# Patient Record
Sex: Male | Born: 1955
Health system: Southern US, Community
[De-identification: ages and names within clinical notes are randomized; demographics above are authoritative.]

## PROBLEM LIST (undated history)

## (undated) DIAGNOSIS — Z0000 Encounter for general adult medical examination without abnormal findings: Secondary | ICD-10-CM

## (undated) DIAGNOSIS — E1140 Type 2 diabetes mellitus with diabetic neuropathy, unspecified: Secondary | ICD-10-CM

## (undated) DIAGNOSIS — M1990 Unspecified osteoarthritis, unspecified site: Secondary | ICD-10-CM

## (undated) DIAGNOSIS — K219 Gastro-esophageal reflux disease without esophagitis: Secondary | ICD-10-CM

## (undated) DIAGNOSIS — G4700 Insomnia, unspecified: Secondary | ICD-10-CM

## (undated) DIAGNOSIS — I10 Essential (primary) hypertension: Secondary | ICD-10-CM

## (undated) DIAGNOSIS — I219 Acute myocardial infarction, unspecified: Secondary | ICD-10-CM

## (undated) DIAGNOSIS — Z860100 Personal history of colon polyps, unspecified: Secondary | ICD-10-CM

## (undated) DIAGNOSIS — G8929 Other chronic pain: Secondary | ICD-10-CM

## (undated) DIAGNOSIS — M79671 Pain in right foot: Secondary | ICD-10-CM

## (undated) DIAGNOSIS — Z87891 Personal history of nicotine dependence: Secondary | ICD-10-CM

## (undated) DIAGNOSIS — E785 Hyperlipidemia, unspecified: Secondary | ICD-10-CM

## (undated) DIAGNOSIS — M722 Plantar fascial fibromatosis: Secondary | ICD-10-CM

## (undated) DIAGNOSIS — I209 Angina pectoris, unspecified: Secondary | ICD-10-CM

## (undated) DIAGNOSIS — E669 Obesity, unspecified: Secondary | ICD-10-CM

## (undated) DIAGNOSIS — G709 Myoneural disorder, unspecified: Secondary | ICD-10-CM

## (undated) DIAGNOSIS — I2510 Atherosclerotic heart disease of native coronary artery without angina pectoris: Secondary | ICD-10-CM

## (undated) DIAGNOSIS — F17200 Nicotine dependence, unspecified, uncomplicated: Secondary | ICD-10-CM

## (undated) DIAGNOSIS — E119 Type 2 diabetes mellitus without complications: Secondary | ICD-10-CM

## (undated) DIAGNOSIS — D649 Anemia, unspecified: Secondary | ICD-10-CM

## (undated) DIAGNOSIS — I739 Peripheral vascular disease, unspecified: Secondary | ICD-10-CM

## (undated) HISTORY — PX: WISDOM TOOTH EXTRACTION: SHX21

## (undated) HISTORY — DX: Gastro-esophageal reflux disease without esophagitis: K21.9

## (undated) HISTORY — DX: Pain in right foot: M79.671

## (undated) HISTORY — PX: OTHER SURGICAL HISTORY: SHX169

## (undated) HISTORY — DX: Peripheral vascular disease, unspecified: I73.9

## (undated) HISTORY — PX: TONSILLECTOMY AND ADENOIDECTOMY: SHX28

## (undated) HISTORY — DX: Personal history of nicotine dependence: Z87.891

## (undated) HISTORY — DX: Obesity, unspecified: E66.9

## (undated) HISTORY — DX: Personal history of colonic polyps: Z86.010

## (undated) HISTORY — DX: Type 2 diabetes mellitus without complications: E11.9

## (undated) HISTORY — DX: Atherosclerotic heart disease of native coronary artery without angina pectoris: I25.10

## (undated) HISTORY — PX: REVISION TOTAL KNEE ARTHROPLASTY: SHX767

## (undated) HISTORY — DX: Myoneural disorder, unspecified: G70.9

## (undated) HISTORY — PX: VASECTOMY: SHX75

## (undated) HISTORY — DX: Nicotine dependence, unspecified, uncomplicated: F17.200

## (undated) HISTORY — DX: Personal history of colon polyps, unspecified: Z86.0100

## (undated) HISTORY — DX: Angina pectoris, unspecified: I20.9

## (undated) HISTORY — DX: Other chronic pain: G89.29

## (undated) HISTORY — DX: Insomnia, unspecified: G47.00

## (undated) HISTORY — DX: Essential (primary) hypertension: I10

## (undated) HISTORY — PX: CORONARY ARTERY BYPASS GRAFT: SHX141

## (undated) HISTORY — DX: Encounter for general adult medical examination without abnormal findings: Z00.00

## (undated) HISTORY — DX: Plantar fascial fibromatosis: M72.2

## (undated) HISTORY — DX: Acute myocardial infarction, unspecified: I21.9

## (undated) HISTORY — DX: Unspecified osteoarthritis, unspecified site: M19.90

## (undated) HISTORY — DX: Hyperlipidemia, unspecified: E78.5

## (undated) HISTORY — DX: Anemia, unspecified: D64.9

## (undated) HISTORY — DX: Hypomagnesemia: E83.42

## (undated) HISTORY — DX: Type 2 diabetes mellitus with diabetic neuropathy, unspecified: E11.40

---

## 1998-11-15 DIAGNOSIS — Z951 Presence of aortocoronary bypass graft: Secondary | ICD-10-CM | POA: Insufficient documentation

## 1998-11-15 HISTORY — DX: Presence of aortocoronary bypass graft: Z95.1

## 1998-11-15 HISTORY — PX: CORONARY ARTERY BYPASS GRAFT: SHX141

## 2004-11-15 DIAGNOSIS — I219 Acute myocardial infarction, unspecified: Secondary | ICD-10-CM

## 2004-11-15 HISTORY — DX: Acute myocardial infarction, unspecified: I21.9

## 2007-03-15 LAB — HM COLONOSCOPY

## 2007-11-16 HISTORY — PX: REVISION TOTAL KNEE ARTHROPLASTY: SHX767

## 2008-07-27 ENCOUNTER — Emergency Department (HOSPITAL_BASED_OUTPATIENT_CLINIC_OR_DEPARTMENT_OTHER): Admission: EM | Admit: 2008-07-27 | Discharge: 2008-07-28 | Payer: Self-pay | Admitting: Emergency Medicine

## 2008-07-28 ENCOUNTER — Ambulatory Visit: Payer: Self-pay | Admitting: Cardiology

## 2008-07-29 ENCOUNTER — Encounter: Payer: Self-pay | Admitting: Cardiology

## 2008-07-29 ENCOUNTER — Inpatient Hospital Stay (HOSPITAL_COMMUNITY): Admission: RE | Admit: 2008-07-29 | Discharge: 2008-08-01 | Payer: Self-pay | Admitting: Internal Medicine

## 2008-08-07 ENCOUNTER — Ambulatory Visit: Payer: Self-pay | Admitting: Cardiology

## 2008-08-07 ENCOUNTER — Ambulatory Visit: Payer: Self-pay | Admitting: *Deleted

## 2008-08-07 DIAGNOSIS — E782 Mixed hyperlipidemia: Secondary | ICD-10-CM

## 2008-08-07 DIAGNOSIS — E1169 Type 2 diabetes mellitus with other specified complication: Secondary | ICD-10-CM

## 2008-08-07 DIAGNOSIS — F1010 Alcohol abuse, uncomplicated: Secondary | ICD-10-CM

## 2008-08-07 DIAGNOSIS — Z860100 Personal history of colon polyps, unspecified: Secondary | ICD-10-CM | POA: Insufficient documentation

## 2008-08-07 DIAGNOSIS — K219 Gastro-esophageal reflux disease without esophagitis: Secondary | ICD-10-CM

## 2008-08-07 DIAGNOSIS — E669 Obesity, unspecified: Secondary | ICD-10-CM | POA: Insufficient documentation

## 2008-08-07 DIAGNOSIS — I739 Peripheral vascular disease, unspecified: Secondary | ICD-10-CM | POA: Insufficient documentation

## 2008-08-07 DIAGNOSIS — I10 Essential (primary) hypertension: Secondary | ICD-10-CM

## 2008-08-07 DIAGNOSIS — I2510 Atherosclerotic heart disease of native coronary artery without angina pectoris: Secondary | ICD-10-CM

## 2008-08-07 HISTORY — DX: Gastro-esophageal reflux disease without esophagitis: K21.9

## 2008-08-07 HISTORY — DX: Atherosclerotic heart disease of native coronary artery without angina pectoris: I25.10

## 2008-08-07 HISTORY — DX: Peripheral vascular disease, unspecified: I73.9

## 2008-08-07 HISTORY — DX: Type 2 diabetes mellitus with other specified complication: E11.69

## 2008-08-07 HISTORY — DX: Obesity, unspecified: E66.9

## 2008-08-07 HISTORY — DX: Personal history of colonic polyps: Z86.010

## 2008-08-07 HISTORY — DX: Essential (primary) hypertension: I10

## 2008-08-07 HISTORY — DX: Mixed hyperlipidemia: E78.2

## 2008-08-07 HISTORY — DX: Alcohol abuse, uncomplicated: F10.10

## 2008-08-21 ENCOUNTER — Ambulatory Visit: Payer: Self-pay | Admitting: *Deleted

## 2008-09-04 ENCOUNTER — Ambulatory Visit: Payer: Self-pay | Admitting: *Deleted

## 2008-09-04 LAB — CONVERTED CEMR LAB
ALT: 17 units/L (ref 0–53)
AST: 18 units/L (ref 0–37)
Albumin: 3.7 g/dL (ref 3.5–5.2)
Alkaline Phosphatase: 78 units/L (ref 39–117)
BUN: 16 mg/dL (ref 6–23)
Basophils Absolute: 0 10*3/uL (ref 0.0–0.1)
Basophils Relative: 0.4 % (ref 0.0–3.0)
CO2: 31 meq/L (ref 19–32)
Calcium: 8.9 mg/dL (ref 8.4–10.5)
Chloride: 104 meq/L (ref 96–112)
Cholesterol: 95 mg/dL (ref 0–200)
Creatinine, Ser: 0.7 mg/dL (ref 0.4–1.5)
Creatinine,U: 76.7 mg/dL
Eosinophils Absolute: 0.1 10*3/uL (ref 0.0–0.7)
Eosinophils Relative: 1.6 % (ref 0.0–5.0)
GFR calc Af Amer: 152 mL/min
GFR calc non Af Amer: 125 mL/min
Glucose, Bld: 155 mg/dL — ABNORMAL HIGH (ref 70–99)
HCT: 36.4 % — ABNORMAL LOW (ref 39.0–52.0)
HDL: 27.6 mg/dL — ABNORMAL LOW (ref >39.0)
Hemoglobin: 13 g/dL (ref 13.0–17.0)
Hgb A1c MFr Bld: 10.4 % — ABNORMAL HIGH (ref 4.6–6.0)
LDL Cholesterol: 58 mg/dL (ref 0–99)
Lymphocytes Relative: 37.4 % (ref 12.0–46.0)
MCHC: 35.7 g/dL (ref 30.0–36.0)
MCV: 87.2 fL (ref 78.0–100.0)
Microalb Creat Ratio: 26.1 mg/g (ref 0.0–30.0)
Microalb, Ur: 2 mg/dL — ABNORMAL HIGH (ref 0.0–1.9)
Monocytes Absolute: 0.4 10*3/uL (ref 0.1–1.0)
Monocytes Relative: 7.1 % (ref 3.0–12.0)
Neutro Abs: 2.8 10*3/uL (ref 1.4–7.7)
Neutrophils Relative %: 53.5 % (ref 43.0–77.0)
Platelets: 255 10*3/uL (ref 150–400)
Potassium: 4.3 meq/L (ref 3.5–5.1)
RBC: 4.17 M/uL — ABNORMAL LOW (ref 4.22–5.81)
RDW: 13.9 % (ref 11.5–14.6)
Sodium: 141 meq/L (ref 135–145)
TSH: 1.13 microintl units/mL (ref 0.35–5.50)
Total Bilirubin: 0.5 mg/dL (ref 0.3–1.2)
Total CHOL/HDL Ratio: 3.4
Total Protein: 6.4 g/dL (ref 6.0–8.3)
Triglycerides: 48 mg/dL (ref 0–149)
VLDL: 10 mg/dL (ref 0–40)
WBC: 5.2 10*3/uL (ref 4.5–10.5)

## 2008-09-10 ENCOUNTER — Ambulatory Visit: Payer: Self-pay | Admitting: *Deleted

## 2008-09-10 DIAGNOSIS — M79609 Pain in unspecified limb: Secondary | ICD-10-CM

## 2008-09-10 DIAGNOSIS — G629 Polyneuropathy, unspecified: Secondary | ICD-10-CM

## 2008-09-10 HISTORY — DX: Pain in unspecified limb: M79.609

## 2008-09-10 HISTORY — DX: Polyneuropathy, unspecified: G62.9

## 2008-09-13 ENCOUNTER — Encounter (INDEPENDENT_AMBULATORY_CARE_PROVIDER_SITE_OTHER): Payer: Self-pay | Admitting: *Deleted

## 2008-10-16 ENCOUNTER — Ambulatory Visit: Payer: Self-pay | Admitting: *Deleted

## 2008-10-30 ENCOUNTER — Ambulatory Visit: Payer: Self-pay | Admitting: Cardiology

## 2008-11-18 ENCOUNTER — Ambulatory Visit: Payer: Self-pay | Admitting: *Deleted

## 2008-11-18 LAB — CONVERTED CEMR LAB
BUN: 21 mg/dL (ref 6–23)
CO2: 30 meq/L (ref 19–32)
Calcium: 9.4 mg/dL (ref 8.4–10.5)
Chloride: 103 meq/L (ref 96–112)
Creatinine, Ser: 0.7 mg/dL (ref 0.4–1.5)
GFR calc Af Amer: 152 mL/min
GFR calc non Af Amer: 125 mL/min
Glucose, Bld: 147 mg/dL — ABNORMAL HIGH (ref 70–99)
Potassium: 5 meq/L (ref 3.5–5.1)
Sodium: 140 meq/L (ref 135–145)

## 2008-11-22 ENCOUNTER — Telehealth (INDEPENDENT_AMBULATORY_CARE_PROVIDER_SITE_OTHER): Payer: Self-pay | Admitting: *Deleted

## 2008-12-23 ENCOUNTER — Ambulatory Visit: Payer: Self-pay | Admitting: *Deleted

## 2009-01-15 ENCOUNTER — Ambulatory Visit: Payer: Self-pay | Admitting: *Deleted

## 2009-02-19 ENCOUNTER — Ambulatory Visit: Payer: Self-pay | Admitting: *Deleted

## 2009-02-19 LAB — CONVERTED CEMR LAB
ALT: 21 units/L (ref 0–53)
AST: 24 units/L (ref 0–37)
Albumin: 4.1 g/dL (ref 3.5–5.2)
Alkaline Phosphatase: 89 units/L (ref 39–117)
BUN: 21 mg/dL (ref 6–23)
CO2: 32 meq/L (ref 19–32)
Calcium: 9.4 mg/dL (ref 8.4–10.5)
Chloride: 107 meq/L (ref 96–112)
Cholesterol: 104 mg/dL (ref 0–200)
Creatinine, Ser: 0.6 mg/dL (ref 0.4–1.5)
Creatinine,U: 115.6 mg/dL
GFR calc non Af Amer: 149.17 mL/min (ref >60)
Glucose, Bld: 117 mg/dL — ABNORMAL HIGH (ref 70–99)
HDL: 29.9 mg/dL — ABNORMAL LOW (ref >39.00)
Hgb A1c MFr Bld: 7.5 % — ABNORMAL HIGH (ref 4.6–6.5)
LDL Cholesterol: 59 mg/dL (ref 0–99)
Microalb Creat Ratio: 46.7 mg/g — ABNORMAL HIGH (ref 0.0–30.0)
Microalb, Ur: 5.4 mg/dL — ABNORMAL HIGH (ref 0.0–1.9)
Potassium: 4.6 meq/L (ref 3.5–5.1)
Sodium: 144 meq/L (ref 135–145)
Total Bilirubin: 0.7 mg/dL (ref 0.3–1.2)
Total CHOL/HDL Ratio: 3
Total Protein: 6.8 g/dL (ref 6.0–8.3)
Triglycerides: 77 mg/dL (ref 0.0–149.0)
VLDL: 15.4 mg/dL (ref 0.0–40.0)

## 2009-03-25 ENCOUNTER — Telehealth: Payer: Self-pay | Admitting: Internal Medicine

## 2009-05-21 ENCOUNTER — Ambulatory Visit: Payer: Self-pay | Admitting: Internal Medicine

## 2009-05-21 DIAGNOSIS — F17200 Nicotine dependence, unspecified, uncomplicated: Secondary | ICD-10-CM

## 2009-05-21 DIAGNOSIS — Z87891 Personal history of nicotine dependence: Secondary | ICD-10-CM | POA: Insufficient documentation

## 2009-05-21 HISTORY — DX: Personal history of nicotine dependence: Z87.891

## 2009-05-21 HISTORY — DX: Nicotine dependence, unspecified, uncomplicated: F17.200

## 2009-06-23 ENCOUNTER — Encounter: Payer: Self-pay | Admitting: Internal Medicine

## 2009-06-23 LAB — HM DIABETES EYE EXAM: HM Diabetic Eye Exam: NORMAL

## 2009-07-16 ENCOUNTER — Ambulatory Visit: Payer: Self-pay | Admitting: Internal Medicine

## 2009-07-16 LAB — CONVERTED CEMR LAB
BUN: 21 mg/dL (ref 6–23)
CO2: 27 meq/L (ref 19–32)
Calcium: 9.1 mg/dL (ref 8.4–10.5)
Chloride: 102 meq/L (ref 96–112)
Creatinine, Ser: 0.72 mg/dL (ref 0.40–1.50)
Glucose, Bld: 127 mg/dL — ABNORMAL HIGH (ref 70–99)
Hgb A1c MFr Bld: 8.9 % — ABNORMAL HIGH (ref 4.6–6.1)
Microalb, Ur: 1.37 mg/dL (ref 0.00–1.89)
Potassium: 4.8 meq/L (ref 3.5–5.3)
Sodium: 139 meq/L (ref 135–145)

## 2009-07-23 ENCOUNTER — Ambulatory Visit: Payer: Self-pay | Admitting: Internal Medicine

## 2009-09-17 ENCOUNTER — Ambulatory Visit: Payer: Self-pay | Admitting: Internal Medicine

## 2009-09-17 LAB — CONVERTED CEMR LAB
BUN: 21 mg/dL (ref 6–23)
CO2: 29 meq/L (ref 19–32)
Calcium: 9.3 mg/dL (ref 8.4–10.5)
Chloride: 103 meq/L (ref 96–112)
Creatinine, Ser: 0.88 mg/dL (ref 0.40–1.50)
Glucose, Bld: 156 mg/dL — ABNORMAL HIGH (ref 70–99)
Hgb A1c MFr Bld: 7.5 % — ABNORMAL HIGH (ref 4.6–6.1)
Potassium: 4.4 meq/L (ref 3.5–5.3)
Sodium: 140 meq/L (ref 135–145)

## 2009-09-22 ENCOUNTER — Encounter: Payer: Self-pay | Admitting: Internal Medicine

## 2009-09-24 ENCOUNTER — Ambulatory Visit: Payer: Self-pay | Admitting: Internal Medicine

## 2009-09-29 ENCOUNTER — Encounter: Payer: Self-pay | Admitting: Internal Medicine

## 2009-10-27 ENCOUNTER — Encounter: Payer: Self-pay | Admitting: Internal Medicine

## 2009-10-28 ENCOUNTER — Encounter: Payer: Self-pay | Admitting: Internal Medicine

## 2009-12-22 ENCOUNTER — Telehealth: Payer: Self-pay | Admitting: Internal Medicine

## 2009-12-24 ENCOUNTER — Ambulatory Visit: Payer: Self-pay | Admitting: Internal Medicine

## 2009-12-24 LAB — CONVERTED CEMR LAB
BUN: 20 mg/dL (ref 6–23)
CO2: 28 meq/L (ref 19–32)
Calcium: 9.9 mg/dL (ref 8.4–10.5)
Chloride: 95 meq/L — ABNORMAL LOW (ref 96–112)
Creatinine, Ser: 0.94 mg/dL (ref 0.40–1.50)
Glucose, Bld: 383 mg/dL — ABNORMAL HIGH (ref 70–99)
Potassium: 5.3 meq/L (ref 3.5–5.3)
Sodium: 134 meq/L — ABNORMAL LOW (ref 135–145)

## 2009-12-25 ENCOUNTER — Telehealth: Payer: Self-pay | Admitting: Internal Medicine

## 2009-12-26 ENCOUNTER — Telehealth: Payer: Self-pay | Admitting: Internal Medicine

## 2010-01-07 ENCOUNTER — Ambulatory Visit: Payer: Self-pay | Admitting: Internal Medicine

## 2010-03-25 ENCOUNTER — Encounter: Payer: Self-pay | Admitting: Internal Medicine

## 2010-03-25 ENCOUNTER — Telehealth: Payer: Self-pay | Admitting: Internal Medicine

## 2010-03-25 LAB — CONVERTED CEMR LAB
BUN: 20 mg/dL (ref 6–23)
CO2: 27 meq/L (ref 19–32)
Calcium: 9.3 mg/dL (ref 8.4–10.5)
Chloride: 103 meq/L (ref 96–112)
Creatinine, Ser: 0.77 mg/dL (ref 0.40–1.50)
Glucose, Bld: 89 mg/dL (ref 70–99)
Hgb A1c MFr Bld: 7.8 % — ABNORMAL HIGH (ref <5.7)
Potassium: 4.9 meq/L (ref 3.5–5.3)
Sodium: 139 meq/L (ref 135–145)

## 2010-04-01 ENCOUNTER — Ambulatory Visit: Payer: Self-pay | Admitting: Internal Medicine

## 2010-04-29 ENCOUNTER — Telehealth: Payer: Self-pay | Admitting: Internal Medicine

## 2010-12-15 NOTE — Progress Notes (Signed)
Summary: Phone note  Phone Note From Other Clinic   Caller: Spouse Reason for Call: Talk to Doctor Summary of Call: Patient requesting generic prescriptions instead of Actos, Lipitor and Januvia. Wife states they are unable to pay for these prescriptions and they need generic.   Patient was instructed to report blood pressure readings.  November 19, 2008       4:30pm   189/118       Pulse 112 (out of meds) November 19, 2008       10:30pm   164/98       Pulse 96  (went back on meds) November 20, 2008       10:30am   146/95       Pulse 83 November 21, 2008                        142/87       Pulse 78 November 22, 2008                        141/86       Pulse 74  (improving now that back on meds) Initial call taken by: Darra Lis RMA,  November 22, 2008 2:01 PM  Follow-up for Phone Call        I gave patient a month's worth of lipitor samples and he is coming back next month - we can discuss changing that at the next appointment - if I have samples again - he will get more samples.  I do not want to make too many changes at one time.  There are no generics for januvia or actos - listed as second tier for him - or any equivalent medication that is generic.  He is already on generic metformen and glyburide.  We have no good generic option to add to his medications.   If they can not afford these two medications, the next best solution is to start insulin to replace both meds - one shot at night - lantus is second tier for him.  Please let me know what they want to do. Follow-up by: Paulo Fruit MD,  November 22, 2008 2:27 PM  Additional Follow-up for Phone Call Additional follow up Details #1::        Attempted to call patient-no answer. I left message for patient to return phone call.    New/Updated Medications: HUMULIN R 100 UNIT/ML SOLN (INSULIN REGULAR HUMAN)  Insulin not ordered or sent to pharmacy.  Error in EMR  Called patient and spoke with his wife. Informed her of the options and she  will talk with Mr. Linder and decide what they want to do. Patient will call up back. Darra Lis RMA  November 25, 2008 8:50 AM  Patient will continue with Actos and Januvia for right now.  Darra Lis RMA  November 26, 2008 8:45 AM

## 2010-12-15 NOTE — Assessment & Plan Note (Signed)
Summary: ROV-CH   Vital Signs:  Patient Profile:   55 Years Old Male Height:     72 inches Weight:      203 pounds BMI:     27.63 O2 treatment:    Room Air Temp:     97.8 degrees F oral Pulse rate:   84 / minute Pulse rhythm:   regular Resp:     20 per minute BP sitting:   152 / 88  (right arm) Cuff size:   regular  Vitals Entered By: Darra Lis RMA (September 10, 2008 9:11 AM)                 Visit Type:  follow up PCP:  Paulo Fruit MD  Chief Complaint:  follow up .  History of Present Illness: Patient is here for f/u on diabetes.  At last appt, his medication was changed and BGM are improved in the 120- 150 range - but still not where we want them to be quite yet.  His blood pressure is also still running higher than we would like it.  His GERD is currently under good control.  He continues to have pain in right foot and right great toe.  It is better when he wears his orthotics, but never resolves.   He was seeing podiatry before he moved here and would like a referral to see a local podiatrist.  The pain is multifactorial with diabetic peripheral neuropathy, peripheral arterial disease and features of plantar fasciitis.    Patient sees Dr. Jens Som for CAD s/p CABG  Recent labs are reviewed.  Tests: (1) LIPID PROFILE (LIPID)   CHOLESTEROL               95 mg/dL                    1-308   TRIGLYCERIDES             48 mg/dL                    6-578   HDL                  [L]  46.9 mg/dL                  >62.9   -   patient needs to increase weight bearing exercise and add fish oil / omega 3   VLDL CHOLESTEROL          10 mg/dl                    5-28   LDL CHOLESTEROL           58 mg/dl                    4-13  CHOL/HDL Ratio: CHD Risk                             3.4 CALC  Tests: (2) CBC WITH DIFF (CBCD)  - accpetable  Tests: (3) COMPREHENSIVE PANEL (COMP)  - acceptable  Tests: (4) MICROABUMIN/CREATININE RATIO (MALB)  -  acceptable  Tests: (5) HGB A1C  (A1C)   A1C%                 [H]  10.4 %  4.6-6.0  - patient is working on improving this over the last 4-6 weeks.  This is lower than initial, but still with lots of room for improvement.  Tests: (6) FastTSH (TSH)  WNL    Current Allergies (reviewed today): No known allergies   Past Medical History:    Colonic polyps, hx of    Diabetes mellitus, type II    GERD    Hyperlipidemia    Hypertension    Coronary artery disease s/p CABG    Peripheral vascular disease    diabetic neuropathy    plantar fasciitis  Past Surgical History:    Reviewed history from 08/07/2008 and no changes required:       Coronary artery bypass graft       PAD surgery       knee surgery     Review of Systems       no nausea / vomiting / diarrhea / fever / chills / chest pain / SOB   Physical Exam  General:     Well-developed, well nourished, well hydrated, in no acute distress; appropriate and cooperative   Head:     Normocephalic and atraumatic without obvious abnormalities.  Eyes:     No corneal or conjunctival inflammation. Vision grossly normal. Ears:     External ear shows no significant lesions or deformities.  Hearing is grossly normal. Nose:     External nasal examination shows no deformity or inflammation.  Neck:     supple, full ROM Lungs:     Normal respiratory effort, chest expands symmetrically with good air flow noted. Lungs clear to auscultation with no crackles, rales or wheezes.   Heart:     Normal rate and  rhythm. S1 and S2 normal, without gallop, murmur, click, rub  Extremities:     No clubbing, cyanosis, edema, or deformity noted, grossly normal full range of motion with all joints.  Neurologic:     alert & oriented X3,  gait normal.  no deficit in strength noted, no balance problems noted, neuro is grossly intact Skin:     Intact without suspicious lesions or rashes Psych:     Cognition and judgment appear intact. Alert and cooperative with  normal attention span and concentration. No apparent delusions, illusions, hallucinations, normally interactive, good eye contact, not anxious or depressed appearing, and not agitated.       Impression & Recommendations:  Problem # 1:  HYPERTENSION (ICD-401.9) will increase the lisinopril and f/u in one month. His updated medication list for this problem includes:    Lisinopril 10 Mg Tabs (Lisinopril) ..... One tab by mouth once daily    Metoprolol Tartrate 100 Mg Tabs (Metoprolol tartrate) ..... One tablet twice daily   Problem # 2:  DIABETES MELLITUS, TYPE II (ICD-250.00) will increase the glimepiride and f/u in one month His updated medication list for this problem includes:    Lisinopril 10 Mg Tabs (Lisinopril) ..... One tab by mouth once daily    Bayer Aspirin 325 Mg Tabs (Aspirin) .Marland Kitchen... Take 1 tablet by mouth once a day    Januvia 50 Mg Tabs (Sitagliptin phosphate) .Marland Kitchen... Take 1 tablet by mouth once a day    Metformin Hcl 1000 Mg Tabs (Metformin hcl) ..... One tab twice by mouth twice daily    Actos 45 Mg Tabs (Pioglitazone hcl) .Marland Kitchen... Take 1 tablet by mouth once a day    Glimepiride 4 Mg Tabs (Glimepiride) .Marland Kitchen... 2 tabs by mouth once daily   Problem #  3:  DIABETIC PERIPHERAL NEUROPATHY (ICD-250.60) Better diabetic control will help this, but will also refer to podiatry His updated medication list for this problem includes:    Lisinopril 10 Mg Tabs (Lisinopril) ..... One tab by mouth once daily    Bayer Aspirin 325 Mg Tabs (Aspirin) .Marland Kitchen... Take 1 tablet by mouth once a day    Januvia 50 Mg Tabs (Sitagliptin phosphate) .Marland Kitchen... Take 1 tablet by mouth once a day    Metformin Hcl 1000 Mg Tabs (Metformin hcl) ..... One tab twice by mouth twice daily    Actos 45 Mg Tabs (Pioglitazone hcl) .Marland Kitchen... Take 1 tablet by mouth once a day    Glimepiride 4 Mg Tabs (Glimepiride) .Marland Kitchen... 2 tabs by mouth once daily  Orders: Podiatry Referral (Podiatry)   Problem # 4:  FOOT PAIN, RIGHT  (ICD-729.5) Patient needs local podiatrist.  will refer. Orders: Podiatry Referral (Podiatry)   Problem # 5:  CORONARY ARTERY DISEASE (ICD-414.00) patient to continue with Dr. Jens Som His updated medication list for this problem includes:    Lisinopril 10 Mg Tabs (Lisinopril) ..... One tab by mouth once daily    Metoprolol Tartrate 100 Mg Tabs (Metoprolol tartrate) ..... One tablet twice daily    Bayer Aspirin 325 Mg Tabs (Aspirin) .Marland Kitchen... Take 1 tablet by mouth once a day    Nitrostat 0.4 Mg Subl (Nitroglycerin) .Marland Kitchen... 1 tab every 5 minutes as needed   Problem # 6:  HYPERLIPIDEMIA (ICD-272.4) Increase weight bearing exercise and add fish oil / omega 3 - and f/u with Dr. Jens Som His updated medication list for this problem includes:    Lipitor 40 Mg Tabs (Atorvastatin calcium) .Marland Kitchen... Take 1 tablet by mouth once a day   Complete Medication List: 1)  Lipitor 40 Mg Tabs (Atorvastatin calcium) .... Take 1 tablet by mouth once a day 2)  Lisinopril 10 Mg Tabs (Lisinopril) .... One tab by mouth once daily 3)  Metoprolol Tartrate 100 Mg Tabs (Metoprolol tartrate) .... One tablet twice daily 4)  Bayer Aspirin 325 Mg Tabs (Aspirin) .... Take 1 tablet by mouth once a day 5)  Nitrostat 0.4 Mg Subl (Nitroglycerin) .Marland Kitchen.. 1 tab every 5 minutes as needed 6)  Januvia 50 Mg Tabs (Sitagliptin phosphate) .... Take 1 tablet by mouth once a day 7)  Metformin Hcl 1000 Mg Tabs (Metformin hcl) .... One tab twice by mouth twice daily 8)  Actos 45 Mg Tabs (Pioglitazone hcl) .... Take 1 tablet by mouth once a day 9)  Glimepiride 4 Mg Tabs (Glimepiride) .... 2 tabs by mouth once daily   Patient Instructions: 1)  Please schedule a follow-up appointment in 1 month.   Prescriptions: LISINOPRIL 10 MG TABS (LISINOPRIL) one tab by mouth once daily  #30 x 1   Entered and Authorized by:   Paulo Fruit MD   Signed by:   Paulo Fruit MD on 09/10/2008   Method used:   Electronically to        [facility]  [address] (retail)       [phone]       [place], [place]  [address]       Ph: [phone]       Fax: [phone]   RxID:   [id number] GLIMEPIRIDE 4 MG TABS (GLIMEPIRIDE) 2 tabs by mouth once daily  #60 x 1   Entered and Authorized by:   Paulo Fruit MD   Signed by:   Paulo Fruit  MD on 09/10/2008   Method used:   Electronically to        [facility] [address] (retail)       [phone]       [place], [place]  [address]       Ph: [phone]       Fax: [phone]   RxID:   [id number]  ]

## 2010-12-15 NOTE — Medication Information (Signed)
Summary: Possible Nonadherence with Glimepiride/BCBS  Possible Nonadherence with Glimepiride/BCBS   Imported By: Lanelle Bal 11/03/2009 08:13:23  _____________________________________________________________________  External Attachment:    Type:   Image     Comment:   External Document

## 2010-12-15 NOTE — Assessment & Plan Note (Signed)
Summary: 3 MONTH ROV-CH   Vital Signs:  Patient profile:   55 year old male Weight:      207.75 pounds BMI:     28.28 Temp:     97.7 degrees F oral Pulse rate:   76 / minute Pulse rhythm:   regular Resp:     16 per minute BP sitting:   130 / 80  (left arm) Cuff size:   large  Vitals Entered By: Glendell Docker CMA (May 21, 2009 9:14 AM)  Primary Care Janylah Belgrave:  Paulo Fruit MD  CC:  3 Month Follow up disease management and Type 2 diabetes mellitus follow-up.  History of Present Illness: 3 Month follow up disease management low blood sugar 81 high144 avg 130  Type 2 Diabetes Mellitus Follow-Up      This is a 55 year old man who presents for Type 2 diabetes mellitus follow-up.  The patient denies self managed hypoglycemia, hypoglycemia requiring help, and weight gain.  The patient denies the following symptoms: chest pain.  Since the last visit the patient reports good dietary compliance and compliance with medications.  Since the last visit, the patient reports having had no eye care.    Hypertension Follow-Up      The patient also presents for Hypertension follow-up.  The patient denies lightheadedness, headaches, and edema.  The patient denies the following associated symptoms: chest pain.  Compliance with medications (by patient report) has been near 100%.  The patient reports that dietary compliance has been fair.    Preventive Screening-Counseling & Management  Alcohol-Tobacco     Smoking Status: current     Smoking Cessation Counseling: yes     Smoke Cessation Stage: contemplative     Packs/Day: <0.25  Allergies: No Known Drug Allergies  Past History:  Past Medical History: Colonic polyps, hx of Diabetes mellitus, type II GERD  Hyperlipidemia Hypertension Coronary artery disease s/p CABG Peripheral vascular disease (PTA RLE 12/08 [place]) diabetic neuropathy plantar fasciitis FOOT PAIN, RIGHT - chronic (ICD-729.5) ALCOHOL ABUSE, EPISODIC  (ICD-305.02)     Past Surgical History: Coronary artery bypass graft(2000-SVG to diagonal, LIMA to LAD, SVG to LCX) PAD surgery knee replacement   Family History: Family History High cholesterol Family History Hypertension Family History of Stroke Mother with CAD   Social History: Occupation: Licensed conveyancer Second marriage - 3 children from first marriage Tobacco Use - Yes.  Alcohol Use - yes(rare)  Packs/Day:  <0.25  Physical Exam  General:  alert, well-developed, and well-nourished.   Neck:  supple, no masses, and no carotid bruits.   Lungs:  normal respiratory effort and normal breath sounds.   Heart:  normal rate, regular rhythm, and no gallop.   Extremities:  No lower extremity edema   Diabetes Management Exam:    Foot Exam (with socks and/or shoes not present):       Sensory-Monofilament:          Left foot: diminished          Right foot: diminished       Inspection:          Left foot: normal          Right foot: normal   Impression & Recommendations:  Problem # 1:  HYPERTENSION (ICD-401.9) BP still suboptimal.  Increase amlodipine to 10 mg.  His updated medication list for this problem includes:    Lisinopril 40 Mg Tabs (Lisinopril) ..... One tab by mouth qd    Metoprolol Tartrate 100  Mg Tabs (Metoprolol tartrate) ..... One tablet twice daily    Amlodipine Besylate 10 Mg Tabs (Amlodipine besylate) ..... One by mouth qday  BP today: 130/80 Prior BP: 145/84 (02/19/2009)  Labs Reviewed: K+: 4.6 (02/19/2009) Creat: : 0.6 (02/19/2009)   Chol: 104 (02/19/2009)   HDL: 29.90 (02/19/2009)   LDL: 59 (02/19/2009)   TG: 77.0 (02/19/2009)  Problem # 2:  DIABETES MELLITUS, TYPE II (ICD-250.00) Assessment: Improved A1c improved with dietary compliance.   Consider switch metoprolol to carvediolol as it may improve DM control.   Pt to discuss with cardiology.  His updated medication list for this problem includes:    Lisinopril 40 Mg Tabs (Lisinopril) ..... One  tab by mouth qd    Bayer Aspirin 325 Mg Tabs (Aspirin) .Marland Kitchen... Take 1 tablet by mouth once a day    Januvia 100 Mg Tabs (Sitagliptin phosphate) ..... One tab by mouth once daily    Metformin Hcl 1000 Mg Tabs (Metformin hcl) ..... One tab by mouth twice daily    Actos 45 Mg Tabs (Pioglitazone hcl) .Marland Kitchen... Take 1 tablet by mouth once a day    Glimepiride 4 Mg Tabs (Glimepiride) .Marland Kitchen... 2 tabs by mouth once daily  Orders: Ophthalmology Referral (Ophthalmology)  Labs Reviewed: Creat: 0.6 (02/19/2009)    Reviewed HgBA1c results: 7.5 (02/19/2009)  10.4 (09/04/2008)  Problem # 3:  TOBACCO USER (ICD-305.1)  Encouraged smoking cessation and discussed different methods for smoking cessation.   Complete Medication List: 1)  Lipitor 40 Mg Tabs (Atorvastatin calcium) .... Take 1 tablet by mouth once a day 2)  Lisinopril 40 Mg Tabs (Lisinopril) .... One tab by mouth qd 3)  Metoprolol Tartrate 100 Mg Tabs (Metoprolol tartrate) .... One tablet twice daily 4)  Bayer Aspirin 325 Mg Tabs (Aspirin) .... Take 1 tablet by mouth once a day 5)  Nitrostat 0.4 Mg Subl (Nitroglycerin) .Marland Kitchen.. 1 tab every 5 minutes as needed 6)  Januvia 100 Mg Tabs (Sitagliptin phosphate) .... One tab by mouth once daily 7)  Metformin Hcl 1000 Mg Tabs (Metformin hcl) .... One tab by mouth twice daily 8)  Actos 45 Mg Tabs (Pioglitazone hcl) .... Take 1 tablet by mouth once a day 9)  Glimepiride 4 Mg Tabs (Glimepiride) .... 2 tabs by mouth once daily 10)  Centrum Silver Tabs (Multiple vitamins-minerals) .... Take 1 tablet by mouth once a day 11)  Eql Fish Oil 1000 Mg Caps (Omega-3 fatty acids) .... 2 caps daily 12)  Amlodipine Besylate 10 Mg Tabs (Amlodipine besylate) .... One by mouth qday  Patient Instructions: 1)  Please schedule a follow-up appointment in 2 months. 2)  BMP prior to visit, ICD-9:  401.9 3)  HbgA1C prior to visit, ICD-9:  250.02 4)  Urine Microalbumin prior to visit, ICD-9:  250.02 5)  Please return for lab work  one (1) week before your next appointment.  Prescriptions: AMLODIPINE BESYLATE 10 MG TABS (AMLODIPINE BESYLATE) one by mouth qday  #90 x 3   Entered and Authorized by:   D. Thomos Lemons DO   Signed by:   D. Thomos Lemons DO on 05/21/2009   Method used:   Electronically to        [facility]* (mail-order)             ,          Ph: [phone]       Fax: [phone]   RxID:   [id number] GLIMEPIRIDE 4 MG TABS (GLIMEPIRIDE) 2 tabs by mouth once  daily  #180 x 3   Entered and Authorized by:   D. Thomos Lemons DO   Signed by:   D. Thomos Lemons DO on 05/21/2009   Method used:   Electronically to        [facility]* (mail-order)             ,          Ph: [phone]       Fax: [phone]   RxID:   [id number] ACTOS 45 MG TABS (PIOGLITAZONE HCL) Take 1 tablet by mouth once a day  #90 x 3   Entered and Authorized by:   D. Thomos Lemons DO   Signed by:   D. Thomos Lemons DO on 05/21/2009   Method used:   Electronically to        [facility]* (mail-order)             ,          Ph: [phone]       Fax: [phone]   RxID:   [id number] METFORMIN HCL 1000 MG TABS (METFORMIN HCL) one tab by mouth twice daily  #180 x 3   Entered and Authorized by:   D. Thomos Lemons DO   Signed by:   D. Thomos Lemons DO on 05/21/2009   Method used:   Electronically to        [facility]* (mail-order)             ,          Ph: [phone]       Fax: [phone]   RxID:   [id number] JANUVIA 100 MG TABS (SITAGLIPTIN PHOSPHATE) one tab by mouth once daily  #90 x 3   Entered and Authorized by:   D. Thomos Lemons DO   Signed by:   D. Thomos Lemons DO on 05/21/2009   Method used:   Electronically to        [facility]* (mail-order)             ,          Ph: [phone]       Fax: [phone]   RxID:   [id number] LISINOPRIL 40 MG TABS (LISINOPRIL) one tab by mouth qd  #90 x 3   Entered and Authorized by:   D. Thomos Lemons DO   Signed by:   D. Thomos Lemons DO on 05/21/2009   Method  used:   Electronically to        [facility]* (mail-order)             ,          Ph: [phone]       Fax: [phone]   RxID:   [id number] LIPITOR 40 MG TABS (ATORVASTATIN CALCIUM) Take 1 tablet by mouth once a day  #90 x 3   Entered and Authorized by:   D. Thomos Lemons DO   Signed by:   D. Thomos Lemons DO on 05/21/2009   Method used:   Electronically to        [facility]* (mail-order)             ,          Ph: [phone]       Fax: [phone]   RxID:   [id number] AMLODIPINE BESYLATE 10 MG TABS (AMLODIPINE BESYLATE) one by mouth qday  #30 x 3   Entered and Authorized by:   D. Molly Maduro  Artist Pais DO   Signed by:   D. Thomos Lemons DO on 05/21/2009   Method used:   Electronically to        [facility] [address] (retail)       [phone]       [place], [place]  [address]       Ph: [phone]       Fax: [phone]   RxID:   [id number]      Preventive Care Screening  Last Pneumovax:    Date:  07/24/2008    Results:  given

## 2010-12-15 NOTE — Assessment & Plan Note (Signed)
Summary: 1 MONTH ROV-CH   Vital Signs:  Patient Profile:   55 Years Old Male Height:     72 inches Weight:      204 pounds BMI:     27.77 O2 treatment:    Room Air Temp:     97.7 degrees F oral Pulse rate:   100 / minute Pulse rhythm:   regular Resp:     18 per minute BP sitting:   170 / 98  (right arm) Cuff size:   regular  Vitals Entered By: Darra Lis RMA (November 18, 2008 9:11 AM)                 Visit Type:  follow up PCP:  Paulo Fruit MD  Chief Complaint:  follow up diabetes.  History of Present Illness: patient ran out of metoprolol.  patient is rather non-complaint with medication.  he frequently runs out of meds and does not call for refills . . . or he has refills and simply does not "get around" to picking them up.  A great deal of time is spent talking to the patient about non-complaince.  His BP is elevated today, but unknown how it would be if he was actually on all of his medications.  Patient sees Dr. Jens Som for CAD s/p CABG - he is using lipitor for hyperlipidemia - labs UTD.  He is also out of lipitor.  Will give some samples today.  His BGM are running 100-130 range.  He has not checked since 11/05/08 because he has not gone to the pharmacy to get his strips.  FOOT PAIN, RIGHT and DIABETIC PERIPHERAL NEUROPATHY - patient sees podiatry and both issues have improved.  ALCOHOL ABUSE, EPISODIC - patient has only drank one time in the past 2 months and he states that was not to excess.  He does not want to stop the alcohol use or go to AA.  GERD - under good control at current time using natural methods.         Prior Medication List:  LIPITOR 40 MG TABS (ATORVASTATIN CALCIUM) Take 1 tablet by mouth once a day LISINOPRIL 20 MG TABS (LISINOPRIL) one tab by mouth once daily METOPROLOL TARTRATE 100 MG TABS (METOPROLOL TARTRATE) one tablet twice daily BAYER ASPIRIN 325 MG TABS (ASPIRIN) Take 1 tablet by mouth once a day NITROSTAT 0.4 MG SUBL  (NITROGLYCERIN) 1 tab every 5 minutes as needed JANUVIA 50 MG TABS (SITAGLIPTIN PHOSPHATE) Take 1 tablet by mouth once a day METFORMIN HCL 1000 MG TABS (METFORMIN HCL) one tab twice by mouth twice daily ACTOS 45 MG TABS (PIOGLITAZONE HCL) Take 1 tablet by mouth once a day GLIMEPIRIDE 4 MG TABS (GLIMEPIRIDE) 2 tabs by mouth once daily MELOXICAM 7.5 MG TABS (MELOXICAM) Take 1 tablet by mouth once a day   Current Allergies (reviewed today): No known allergies   Past Medical History:    Colonic polyps, hx of    Diabetes mellitus, type II    GERD    Hyperlipidemia    Hypertension    Coronary artery disease s/p CABG    Peripheral vascular disease (PTA RLE 12/08 [place])    diabetic neuropathy    plantar fasciitis    FOOT PAIN, RIGHT - chronic (ICD-729.5)    ALCOHOL ABUSE, EPISODIC (ICD-305.02)           Past Surgical History:    Reviewed history from 10/30/2008 and no changes required:       Coronary artery  bypass graft(2000-SVG to diagonal, LIMA to LAD, SVG to LCX)       PAD surgery       knee replacement     Review of Systems       no nausea / vomiting / diarrhea / fever / chills / chest pain / SOB    Physical Exam  General:     Well-developed, well nourished, well hydrated, in no acute distress; appropriate and cooperative   Head:     Normocephalic and atraumatic without obvious abnormalities.  Eyes:     No corneal or conjunctival inflammation. Vision grossly normal. Ears:     External ear shows no significant lesions or deformities.  Hearing is grossly normal. Nose:     External nasal examination shows no deformity or inflammation.  Neck:     supple, full ROM Lungs:     Normal respiratory effort, chest expands symmetrically with good air flow noted. Lungs clear to auscultation with no crackles, rales or wheezes.   Heart:     Normal rate and  rhythm. S1 and S2 normal, without gallop, murmur, click, rub  Extremities:     No clubbing, cyanosis, edema, or  deformity noted, grossly normal full range of motion with all joints.  Neurologic:     alert & oriented X3,  gait normal.  no deficit in strength noted, no balance problems noted, neuro is grossly intact Skin:     Intact without suspicious lesions or rashes Psych:     Cognition and judgment appear intact. Alert and cooperative with normal attention span and concentration. No apparent delusions, illusions, hallucinations, normally interactive, good eye contact, not anxious or depressed appearing, and not agitated.       Impression & Recommendations:  Problem # 1:  DIABETES MELLITUS, TYPE II (ICD-250.00) continue current meds.  follow up in one month His updated medication list for this problem includes:    Lisinopril 20 Mg Tabs (Lisinopril) ..... One tab by mouth once daily    Bayer Aspirin 325 Mg Tabs (Aspirin) .Marland Kitchen... Take 1 tablet by mouth once a day    Januvia 50 Mg Tabs (Sitagliptin phosphate) .Marland Kitchen... Take 1 tablet by mouth once a day    Metformin Hcl 1000 Mg Tabs (Metformin hcl) ..... One tab twice by mouth twice daily    Actos 45 Mg Tabs (Pioglitazone hcl) .Marland Kitchen... Take 1 tablet by mouth once a day    Glimepiride 4 Mg Tabs (Glimepiride) .Marland Kitchen... 2 tabs by mouth once daily  Orders: TLB-BMP (Basic Metabolic Panel-BMET) (80048-METABOL)   Problem # 2:  HYPERTENSION (ICD-401.9) I have no idea how his BP is running on meds since he is out of metoprolol.  continue current meds.  follow up in one month WHILE ON MEDS. His updated medication list for this problem includes:    Lisinopril 20 Mg Tabs (Lisinopril) ..... One tab by mouth once daily    Metoprolol Tartrate 100 Mg Tabs (Metoprolol tartrate) ..... One tablet twice daily  Orders: TLB-BMP (Basic Metabolic Panel-BMET) (80048-METABOL)   Problem # 3:  HYPERLIPIDEMIA (ICD-272.4) continue current meds.  follow up in one month His updated medication list for this problem includes:    Lipitor 40 Mg Tabs (Atorvastatin calcium) .Marland Kitchen... Take 1  tablet by mouth once a day   Problem # 4:  FOOT PAIN, RIGHT (ICD-729.5) continue with treatment plan per podiatry  Problem # 5:  ALCOHOL ABUSE, EPISODIC (ICD-305.02) again spoke to patient about abstinence and AA.  he is not interested.  he is in some denial.  encouragement given.  Problem # 6:  ENCOUNTER FOR LONG-TERM USE OF OTHER MEDICATIONS (ICD-V58.69)  Orders: TLB-BMP (Basic Metabolic Panel-BMET) (80048-METABOL)   Problem # 7:  PERS HX NONCOMPLIANCE W/MED TX PRS HAZARDS HLTH (ICD-V15.81) long discussion with paitent about importance of complaince with medication, etc.  also about the possible increase morbidity and possible mortality if noncompliant.  Complete Medication List: 1)  Lipitor 40 Mg Tabs (Atorvastatin calcium) .... Take 1 tablet by mouth once a day 2)  Lisinopril 20 Mg Tabs (Lisinopril) .... One tab by mouth once daily 3)  Metoprolol Tartrate 100 Mg Tabs (Metoprolol tartrate) .... One tablet twice daily 4)  Bayer Aspirin 325 Mg Tabs (Aspirin) .... Take 1 tablet by mouth once a day 5)  Nitrostat 0.4 Mg Subl (Nitroglycerin) .Marland Kitchen.. 1 tab every 5 minutes as needed 6)  Januvia 50 Mg Tabs (Sitagliptin phosphate) .... Take 1 tablet by mouth once a day 7)  Metformin Hcl 1000 Mg Tabs (Metformin hcl) .... One tab twice by mouth twice daily 8)  Actos 45 Mg Tabs (Pioglitazone hcl) .... Take 1 tablet by mouth once a day 9)  Glimepiride 4 Mg Tabs (Glimepiride) .... 2 tabs by mouth once daily 10)  Meloxicam 7.5 Mg Tabs (Meloxicam) .... Take 1 tablet by mouth once a day   Patient Instructions: 1)  Please schedule a follow-up appointment in 1 month.   ]

## 2010-12-15 NOTE — Assessment & Plan Note (Signed)
Summary: 1 MONTH ROV-CH   Vital Signs:  Patient Profile:   55 Years Old Male Height:     72 inches Weight:      208 pounds BMI:     28.31 O2 treatment:    Room Air Temp:     97.8 degrees F oral Pulse rate:   78 / minute Pulse rhythm:   regular Resp:     20 per minute BP sitting:   162 / 92  (right arm) Cuff size:   regular  Vitals Entered By: Darra Lis RMA (October 16, 2008 9:09 AM)                 Visit Type:  follow up PCP:  Paulo Fruit MD  Chief Complaint:  follow up.  History of Present Illness: Patient is here for f/u on diabetes.  At last appt, his medication was changed and BGM were running much better at 100 - 130 range.  However, he received a steroid shot on 09/12/08 for his foot pain from podiatry and the BGM went up to the 150-200 range for about 2 weeks and have been slowly coming down since then.  They were finally down in the 100 - 130 range, but then the patient ran out of actos and they went back up to the 150 - 175 range.  Long discussion with patient that he can't run out of meds - just call for a refill.  We increased his lisinopril at the last appt and it was running good when he went to podiatry, but then podiatry put the patient on Mobic and his blood pressure is running high, again.  The medicine is really helping with his chronic foot pain.  His GERD is currently under good control.  Patient sees Dr. Jens Som for CAD s/p CABG - he is using lipitor for hyperlipidemia - labs UTD.       Updated Prior Medication List: LIPITOR 40 MG TABS (ATORVASTATIN CALCIUM) Take 1 tablet by mouth once a day LISINOPRIL 10 MG TABS (LISINOPRIL) one tab by mouth once daily METOPROLOL TARTRATE 100 MG TABS (METOPROLOL TARTRATE) one tablet twice daily BAYER ASPIRIN 325 MG TABS (ASPIRIN) Take 1 tablet by mouth once a day NITROSTAT 0.4 MG SUBL (NITROGLYCERIN) 1 tab every 5 minutes as needed JANUVIA 50 MG TABS (SITAGLIPTIN PHOSPHATE) Take 1 tablet by mouth once a  day METFORMIN HCL 1000 MG TABS (METFORMIN HCL) one tab twice by mouth twice daily ACTOS 45 MG TABS (PIOGLITAZONE HCL) Take 1 tablet by mouth once a day GLIMEPIRIDE 4 MG TABS (GLIMEPIRIDE) 2 tabs by mouth once daily MELOXICAM 7.5 MG TABS (MELOXICAM) Take 1 tablet by mouth once a day  Current Allergies (reviewed today): No known allergies   Past Medical History:    Reviewed history from 09/10/2008 and no changes required:       Colonic polyps, hx of       Diabetes mellitus, type II       GERD       Hyperlipidemia       Hypertension       Coronary artery disease s/p CABG       Peripheral vascular disease       diabetic neuropathy       plantar fasciitis  Past Surgical History:    Reviewed history from 08/07/2008 and no changes required:       Coronary artery bypass graft       PAD surgery  knee surgery     Review of Systems       no nausea / vomiting / diarrhea / fever / chills / chest pain / SOB    Physical Exam  General:     Well-developed, well nourished, well hydrated, in no acute distress; appropriate and cooperative   Head:     Normocephalic and atraumatic without obvious abnormalities.  Eyes:     No corneal or conjunctival inflammation. Vision grossly normal. Ears:     External ear shows no significant lesions or deformities.  Hearing is grossly normal. Nose:     External nasal examination shows no deformity or inflammation.  Neck:     supple, full ROM Lungs:     Normal respiratory effort, chest expands symmetrically with good air flow noted. Lungs clear to auscultation with no crackles, rales or wheezes.   Heart:     Normal rate and  rhythm. S1 and S2 normal, without gallop, murmur, click, rub  Extremities:     No clubbing, cyanosis, edema, or deformity noted, grossly normal full range of motion with all joints.  Neurologic:     alert & oriented X3,  gait normal.  no deficit in strength noted, no balance problems noted, neuro is grossly  intact Skin:     Intact without suspicious lesions or rashes Psych:     Cognition and judgment appear intact. Alert and cooperative with normal attention span and concentration. No apparent delusions, illusions, hallucinations, normally interactive, good eye contact, not anxious or depressed appearing, and not agitated.       Impression & Recommendations:  Problem # 1:  DIABETES MELLITUS, TYPE II (ICD-250.00) refill actos - has been out for 2 weeks - follow up next month. His updated medication list for this problem includes:    Lisinopril 20 Mg Tabs (Lisinopril) ..... One tab by mouth once daily    Bayer Aspirin 325 Mg Tabs (Aspirin) .Marland Kitchen... Take 1 tablet by mouth once a day    Januvia 50 Mg Tabs (Sitagliptin phosphate) .Marland Kitchen... Take 1 tablet by mouth once a day    Metformin Hcl 1000 Mg Tabs (Metformin hcl) ..... One tab twice by mouth twice daily    Actos 45 Mg Tabs (Pioglitazone hcl) .Marland Kitchen... Take 1 tablet by mouth once a day    Glimepiride 4 Mg Tabs (Glimepiride) .Marland Kitchen... 2 tabs by mouth once daily   Problem # 2:  HYPERTENSION (ICD-401.9) Increase lisinopril and follow up in one month.  Part of the increase in BP is probably due to the mobic, but he needs that for pain control - will see how he does with increased dose of lisinopril. His updated medication list for this problem includes:    Lisinopril 20 Mg Tabs (Lisinopril) ..... One tab by mouth once daily    Metoprolol Tartrate 100 Mg Tabs (Metoprolol tartrate) ..... One tablet twice daily   Problem # 3:  CORONARY ARTERY DISEASE (ICD-414.00) Continue care with Dr. Jens Som. His updated medication list for this problem includes:    Lisinopril 20 Mg Tabs (Lisinopril) ..... One tab by mouth once daily    Metoprolol Tartrate 100 Mg Tabs (Metoprolol tartrate) ..... One tablet twice daily    Bayer Aspirin 325 Mg Tabs (Aspirin) .Marland Kitchen... Take 1 tablet by mouth once a day    Nitrostat 0.4 Mg Subl (Nitroglycerin) .Marland Kitchen... 1 tab every 5 minutes as  needed   Problem # 4:  HYPERLIPIDEMIA (ICD-272.4) Continue meds.  Labs UTD. His updated medication list for this  problem includes:    Lipitor 40 Mg Tabs (Atorvastatin calcium) .Marland Kitchen... Take 1 tablet by mouth once a day   Problem # 5:  FOOT PAIN, RIGHT (ICD-729.5) continue care per podiatry.  Complete Medication List: 1)  Lipitor 40 Mg Tabs (Atorvastatin calcium) .... Take 1 tablet by mouth once a day 2)  Lisinopril 20 Mg Tabs (Lisinopril) .... One tab by mouth once daily 3)  Metoprolol Tartrate 100 Mg Tabs (Metoprolol tartrate) .... One tablet twice daily 4)  Bayer Aspirin 325 Mg Tabs (Aspirin) .... Take 1 tablet by mouth once a day 5)  Nitrostat 0.4 Mg Subl (Nitroglycerin) .Marland Kitchen.. 1 tab every 5 minutes as needed 6)  Januvia 50 Mg Tabs (Sitagliptin phosphate) .... Take 1 tablet by mouth once a day 7)  Metformin Hcl 1000 Mg Tabs (Metformin hcl) .... One tab twice by mouth twice daily 8)  Actos 45 Mg Tabs (Pioglitazone hcl) .... Take 1 tablet by mouth once a day 9)  Glimepiride 4 Mg Tabs (Glimepiride) .... 2 tabs by mouth once daily 10)  Meloxicam 7.5 Mg Tabs (Meloxicam) .... Take 1 tablet by mouth once a day   Patient Instructions: 1)  Please schedule a follow-up appointment in 1 month.   Prescriptions: LISINOPRIL 20 MG TABS (LISINOPRIL) one tab by mouth once daily  #30 x 1   Entered and Authorized by:   Paulo Fruit MD   Signed by:   Paulo Fruit MD on 10/16/2008   Method used:   Electronically to        [facility] [address] (retail)       [phone]       [place], [place]  [address]       Ph: [phone]       Fax: [phone]   RxID:   [id number] ACTOS 45 MG TABS (PIOGLITAZONE HCL) Take 1 tablet by mouth once a day  #30 x 5   Entered and Authorized by:   Paulo Fruit MD   Signed by:   Paulo Fruit MD on 10/16/2008   Method used:   Electronically to        [facility] [address] (retail)       [phone]       [place], [place]  [address]       Ph: [phone]       Fax: [phone]   RxID:   [id number]  ]

## 2010-12-15 NOTE — Miscellaneous (Signed)
Summary: Eye Exam  Clinical Lists Changes  Observations: Added new observation of DMEYEEXAMNXT: 06/2010 (06/23/2009 16:43) Added new observation of DMEYEEXMRES: normal (06/23/2009 16:43) Added new observation of EYE EXAM BY: [facility] Minister- Dr. Jethro Bolus (06/23/2009 16:43) Added new observation of DIAB EYE EX: normal (06/23/2009 16:43)       Diabetes Management Exam:    Eye Exam:       Eye Exam done elsewhere          Date: 06/23/2009          Results: normal          Done by: [facility]- Dr. Jethro Bolus

## 2010-12-15 NOTE — Medication Information (Signed)
Summary: Possible Nonadherence with Lisinopril/[employer]BS  Possible Nonadherence with Lisinopril/[employer]BS   Imported By: Lanelle Bal 10/06/2009 11:00:16  _____________________________________________________________________  External Attachment:    Type:   Image     Comment:   External Document

## 2010-12-15 NOTE — Medication Information (Signed)
Summary: Medication Nonadherence (Amlodipine 10mg  or 5mg )/[employer]BS  Medication Nonadherence (Amlodipine 10mg  or 5mg )/[employer]BS   Imported By: Esmeralda Links D'jimraou 10/30/2009 13:36:43  _____________________________________________________________________  External Attachment:    Type:   Image     Comment:   External Document

## 2010-12-15 NOTE — Letter (Signed)
Summary: Geralynn Rile  [facility]   Imported By: Lanelle Bal 07/01/2009 11:30:01  _____________________________________________________________________  External Attachment:    Type:   Image     Comment:   External Document

## 2010-12-15 NOTE — Progress Notes (Signed)
Summary: refill- crestor  Phone Note Call from Patient   Caller: patient's wife Call For: D. Thomos Lemons DO Summary of Call: Pt's wife called stating that pt is almost out of Crestor 10mg  samples. He would like rx sent to [facility] on [address]. I did not see this noted in his chart.  Please advise?  Mervin Kung CMA  Mar 25, 2010 11:57 AM   Follow-up for Phone Call        yes, I gave him samples. I will call in Rx Follow-up by: D. Thomos Lemons DO,  Mar 25, 2010 12:22 PM  Additional Follow-up for Phone Call Additional follow up Details #1::        Left message on home voicemail that refill was sent to pharmacy.  Mervin Kung CMA  Mar 25, 2010 1:57 PM     New/Updated Medications: CRESTOR 20 MG TABS (ROSUVASTATIN CALCIUM) one by mouth once daily Prescriptions: CRESTOR 20 MG TABS (ROSUVASTATIN CALCIUM) one by mouth once daily  #30 x 5   Entered and Authorized by:   D. Thomos Lemons DO   Signed by:   D. Thomos Lemons DO on 03/25/2010   Method used:   Electronically to        [facility] [address] (retail)       [phone]       [place], [place]  [address]       Ph: [phone]       Fax: [phone]   RxID:   [id number]

## 2010-12-15 NOTE — Assessment & Plan Note (Signed)
Summary: 2 WEEK ROV-CH   Vital Signs:  Patient Profile:   55 Years Old Male Height:     72 inches Weight:      202 pounds BMI:     27.50 O2 treatment:    Room Air Temp:     97.9 degrees F oral Pulse rate:   80 / minute Pulse rhythm:   regular Resp:     20 per minute BP sitting:   130 / 80  (right arm) Cuff size:   regular  Vitals Entered By: Darra Lis RMA (August 21, 2008 9:16 AM)                 Visit Type:  follow up PCP:  Paulo Fruit MD  Chief Complaint:  follow for diabetes.  History of Present Illness: Patient's initial appt was 08/07/08 and HPI read as follows: "Patient was in the hospital last week because of excessive alcohol intake. Patient had vomiting and dehydration, so was brought to the ER.  Also patient has HTN, DM, hyperlipidemia and had been out of all meds for a long time.  He was admitted to the hospital and initially placed in ICU. He was told that his blood pressure and blood sugars were "out of control".  Patient states that he normally uses alcohol very rarely.  He denies any previous episodes of excessive alcohol intake, black outs or abuse.  He states that he was celebrating that he finally got a job . . . and also that he has been under tremendous stress both personal and financial.  He's not sure why he just kept drinking, but he drank 2 bottles of wine in about 4 hours time.  He began vomiting and felt very sick.  That is when he was taken to the ER.   Since discharge, he has had no complaints.  On discharge, the hospital MD wrote to continue all of his medication.  His blood sugars are running 200 - 275.  He used to be on Glucophage 1,000mg  twice daily - but he was discharged on 500mg  once daily."  At that appt on 08/07/08, we increased the glucophage to 500mg  twice daily and instructed the patient to increase the dose every 3 days if BGM was still above 150 - to a max of 1,000 twice daily.  He is now on 1,000 mg two times a day with BGM in the  130 - 160 range.    He is otherwise feeling much better.  His new job is working out well.  He had his f/u appt with cardiology and is doing well in that area, too.    He has no other concerns today.    Updated Prior Medication List: LIPITOR 40 MG TABS (ATORVASTATIN CALCIUM) Take 1 tablet by mouth once a day LISINOPRIL 5 MG TABS (LISINOPRIL) Take 1 tablet by mouth once a day METOPROLOL TARTRATE 100 MG TABS (METOPROLOL TARTRATE) one tablet twice daily BAYER ASPIRIN 325 MG TABS (ASPIRIN) Take 1 tablet by mouth once a day NITROSTAT 0.4 MG SUBL (NITROGLYCERIN) 1 tab every 5 minutes as needed JANUVIA 50 MG TABS (SITAGLIPTIN PHOSPHATE) Take 1 tablet by mouth once a day METFORMIN HCL 1000 MG TABS (METFORMIN HCL) one tab twice by mouth twice daily ACTOS 45 MG TABS (PIOGLITAZONE HCL) Take 1 tablet by mouth once a day GLIMEPIRIDE 2 MG TABS (GLIMEPIRIDE) one tab every day  Current Allergies (reviewed today): No known allergies   Past Medical History:    Reviewed history  from 08/07/2008 and no changes required:       Colonic polyps, hx of       Diabetes mellitus, type II       GERD       Hyperlipidemia       Hypertension       Coronary artery disease       Peripheral vascular disease  Past Surgical History:    Reviewed history from 08/07/2008 and no changes required:       Coronary artery bypass graft       PAD surgery       knee surgery   Family History:    Reviewed history from 08/07/2008 and no changes required:       Family History High cholesterol       Family History Hypertension       Family History of Stroke  Social History:    Reviewed history from 08/07/2008 and no changes required:       Occupation: Licensed conveyancer       Second marriage - 3 children from first marriage   Risk Factors:  Tobacco use:  quit    Year quit:  06/2008    Pack-years:  35 years x  1 pack per day Passive smoke exposure:  no Drug use:  no HIV high-risk behavior:  no Caffeine use:  2 drinks  per day Alcohol use:  yes    Type:  wine / beer - usually 2-3 servings / week    Drinks per day:  0    Has patient --       Felt need to cut down:  no       Been annoyed by complaints:  no       Felt guilty about drinking:  no       Needed eye opener in the morning:  no    Counseled to quit/cut down alcohol use:  no Exercise:  yes    Times per week:  5    Type:  walking Seatbelt use:  100 % Sun Exposure:  occasionally  Family History Risk Factors:    Family History of MI in females < 26 years old:  no    Family History of MI in males < 49 years old:  no  Colonoscopy History:    Date of Last Colonoscopy:  03/15/2007   Review of Systems       no nausea / vomiting / diarrhea / fever / chills / chest pain / SOB     Impression & Recommendations:  Problem # 1:  DIABETES MELLITUS, TYPE II (ICD-250.00) Will increae glimepiride to 4 mg each day.  Follow up in 3 weeks.  Dr. Jens Som has labs ordered on 09/04/08 - I added a lab request to that.  He will see me after the labs are completed. His updated medication list for this problem includes:    Lisinopril 5 Mg Tabs (Lisinopril) .Marland Kitchen... Take 1 tablet by mouth once a day    Bayer Aspirin 325 Mg Tabs (Aspirin) .Marland Kitchen... Take 1 tablet by mouth once a day    Januvia 50 Mg Tabs (Sitagliptin phosphate) .Marland Kitchen... Take 1 tablet by mouth once a day    Metformin Hcl 1000 Mg Tabs (Metformin hcl) ..... One tab twice by mouth twice daily    Actos 45 Mg Tabs (Pioglitazone hcl) .Marland Kitchen... Take 1 tablet by mouth once a day    Glimepiride 4 Mg Tabs (Glimepiride) ..... One tab every day  Problem # 2:  HYPERTENSION (ICD-401.9) continue current meds His updated medication list for this problem includes:    Lisinopril 5 Mg Tabs (Lisinopril) .Marland Kitchen... Take 1 tablet by mouth once a day    Metoprolol Tartrate 100 Mg Tabs (Metoprolol tartrate) ..... One tablet twice daily   Problem # 3:  HYPERLIPIDEMIA (ICD-272.4) Cardiology has ordered labs for the patient to be  done this month His updated medication list for this problem includes:    Lipitor 40 Mg Tabs (Atorvastatin calcium) .Marland Kitchen... Take 1 tablet by mouth once a day   Problem # 4:  CORONARY ARTERY DISEASE (ICD-414.00) cardiology is following His updated medication list for this problem includes:    Lisinopril 5 Mg Tabs (Lisinopril) .Marland Kitchen... Take 1 tablet by mouth once a day    Metoprolol Tartrate 100 Mg Tabs (Metoprolol tartrate) ..... One tablet twice daily    Bayer Aspirin 325 Mg Tabs (Aspirin) .Marland Kitchen... Take 1 tablet by mouth once a day    Nitrostat 0.4 Mg Subl (Nitroglycerin) .Marland Kitchen... 1 tab every 5 minutes as needed   Complete Medication List: 1)  Lipitor 40 Mg Tabs (Atorvastatin calcium) .... Take 1 tablet by mouth once a day 2)  Lisinopril 5 Mg Tabs (Lisinopril) .... Take 1 tablet by mouth once a day 3)  Metoprolol Tartrate 100 Mg Tabs (Metoprolol tartrate) .... One tablet twice daily 4)  Bayer Aspirin 325 Mg Tabs (Aspirin) .... Take 1 tablet by mouth once a day 5)  Nitrostat 0.4 Mg Subl (Nitroglycerin) .Marland Kitchen.. 1 tab every 5 minutes as needed 6)  Januvia 50 Mg Tabs (Sitagliptin phosphate) .... Take 1 tablet by mouth once a day 7)  Metformin Hcl 1000 Mg Tabs (Metformin hcl) .... One tab twice by mouth twice daily 8)  Actos 45 Mg Tabs (Pioglitazone hcl) .... Take 1 tablet by mouth once a day 9)  Glimepiride 4 Mg Tabs (Glimepiride) .... One tab every day   Patient Instructions: 1)  Please schedule a follow-up appointment after the 21st.    Prescriptions: METFORMIN HCL 1000 MG TABS (METFORMIN HCL) one tab twice by mouth twice daily  #180 x 1   Entered and Authorized by:   Paulo Fruit MD   Signed by:   Paulo Fruit MD on 08/21/2008   Method used:   Electronically to        [facility] [address] (retail)       [phone]       [place], [place]  [address]       Ph: [phone]       Fax: [phone]   RxID:   [id number] GLIMEPIRIDE 4 MG TABS  (GLIMEPIRIDE) one tab every day  #90 x 1   Entered and Authorized by:   Paulo Fruit MD   Signed by:   Paulo Fruit MD on 08/21/2008   Method used:   Electronically to        [facility] [address] (retail)       [phone]       [place], [place]  [address]       Ph: [phone]       Fax: [phone]   RxID:   [id number]  ]

## 2010-12-15 NOTE — Assessment & Plan Note (Signed)
Summary: 3 MONTH FOLLOW UP/MHF   Vital Signs:  Patient profile:   56 year old male Height:      72 inches Weight:      202.50 pounds BMI:     27.56 O2 Sat:      100 % on Room air Temp:     97.9 degrees F oral Pulse rate:   66 / minute Pulse rhythm:   regular Resp:     18 per minute BP sitting:   130 / 80  (left arm) Cuff size:   large  Vitals Entered By: Glendell Docker CMA (Apr 01, 2010 10:13 AM)  O2 Flow:  Room air CC: Rm 3- 3 Month Follow up disease management, Type 2 diabetes mellitus follow-up  Does patient need assistance? Functional Status Self care   Primary Care Provider:  D. Thomos Lemons DO  CC:  Rm 3- 3 Month Follow up disease management and Type 2 diabetes mellitus follow-up.  History of Present Illness:   Type 2 Diabetes Mellitus Follow-Up      This is a 55 year old man who presents for Type 2 diabetes mellitus follow-up.  The patient denies self managed hypoglycemia, hypoglycemia requiring help, and weight gain.  The patient denies the following symptoms: chest pain.  Since the last visit the patient reports good dietary compliance, compliance with medications, and monitoring blood glucose.  low blood sugar 75 high 168 avg 124 30day avg  htn - stable   Allergies: No Known Drug Allergies  Past History:  Past Medical History: Colonic polyps, hx of Diabetes mellitus, type II GERD   Hyperlipidemia     Hypertension Coronary artery disease s/p CABG Peripheral vascular disease (PTA RLE 12/08 [place]) diabetic neuropathy plantar fasciitis FOOT PAIN, RIGHT - chronic (ICD-729.5) ALCOHOL ABUSE, EPISODIC (ICD-305.02)     Past Surgical History: Coronary artery bypass graft(2000-SVG to diagonal, LIMA to LAD, SVG to LCX) PAD surgery knee replacement        Family History: Family History High cholesterol Family History Hypertension Family History of Stroke Mother with CAD        Social History: Occupation: Licensed conveyancer Second marriage - 3  children from first marriage Tobacco Use - Yes.   Alcohol Use - yes(rare)        Physical Exam  General:  alert, well-developed, and well-nourished.   Neck:  supple, no masses, and no carotid bruits.   Lungs:  normal respiratory effort and normal breath sounds.   Heart:  normal rate, regular rhythm, and no gallop.   Extremities:  No lower extremity edema   Diabetes Management Exam:    Foot Exam (with socks and/or shoes not present):       Inspection:          Left foot: normal          Right foot: normal   Impression & Recommendations:  Problem # 1:  DIABETES MELLITUS, TYPE II (ICD-250.00) Pt with reasonable control.   pt understands to decrease glimepiride if poor p.o. intake  His updated medication list for this problem includes:    Lisinopril 40 Mg Tabs (Lisinopril) ..... One tab by mouth qd    Bayer Aspirin 325 Mg Tabs (Aspirin) .Marland Kitchen... Take 1 tablet by mouth once a day    Metformin Hcl 1000 Mg Tabs (Metformin hcl) ..... One tab by mouth twice daily    Glimepiride 4 Mg Tabs (Glimepiride) .Marland Kitchen... 1 tab by mouth once daily    Lantus Solostar 100 Unit/ml  Soln (Insulin glargine) .Marland Kitchen... 20 -30 units once daily subcutaneously  Future Orders: T- Hemoglobin A1C ([id number]) ... 07/27/2010 T-Urine Microalbumin w/creat. ratio [phone]) ... 07/27/2010  Labs Reviewed: Creat: 0.77 (03/25/2010)     Last Eye Exam: normal (06/23/2009) Reviewed HgBA1c results: 7.8 (03/25/2010)  7.5 (09/17/2009)  Problem # 2:  HYPERTENSION (ICD-401.9) stable.  Maintain current medication regimen.  His updated medication list for this problem includes:    Lisinopril 40 Mg Tabs (Lisinopril) ..... One tab by mouth qd    Metoprolol Tartrate 100 Mg Tabs (Metoprolol tartrate) ..... One tablet twice daily    Amlodipine Besylate 10 Mg Tabs (Amlodipine besylate) ..... One by mouth qday  Future Orders: T-Basic Metabolic Panel [id number]) ... 07/27/2010  BP today: 130/80 Prior BP: 130/60  (01/07/2010)  Labs Reviewed: K+: 4.9 (03/25/2010) Creat: : 0.77 (03/25/2010)   Chol: 104 (02/19/2009)   HDL: 29.90 (02/19/2009)   LDL: 59 (02/19/2009)   TG: 77.0 (02/19/2009)  Problem # 3:  HYPERLIPIDEMIA (ICD-272.4) crestor cost prohibitive.  change to simvastatin The following medications were removed from the medication list:    Crestor 20 Mg Tabs (Rosuvastatin calcium) ..... One by mouth once daily His updated medication list for this problem includes:    Simvastatin 40 Mg Tabs (Simvastatin) ..... One by mouth qpm  Future Orders: T-Lipid Profile ([id number]) ... 07/27/2010  Labs Reviewed: SGOT: 24 (02/19/2009)   SGPT: 21 (02/19/2009)   HDL:29.90 (02/19/2009), 27.6 (09/04/2008)  LDL:59 (02/19/2009), 58 (09/04/2008)  Chol:104 (02/19/2009), 95 (09/04/2008)  Trig:77.0 (02/19/2009), 48 (09/04/2008)  Complete Medication List: 1)  Lisinopril 40 Mg Tabs (Lisinopril) .... One tab by mouth qd 2)  Metoprolol Tartrate 100 Mg Tabs (Metoprolol tartrate) .... One tablet twice daily 3)  Bayer Aspirin 325 Mg Tabs (Aspirin) .... Take 1 tablet by mouth once a day 4)  Nitrostat 0.4 Mg Subl (Nitroglycerin) .Marland Kitchen.. 1 tab every 5 minutes as needed 5)  Metformin Hcl 1000 Mg Tabs (Metformin hcl) .... One tab by mouth twice daily 6)  Glimepiride 4 Mg Tabs (Glimepiride) .Marland Kitchen.. 1 tab by mouth once daily 7)  Centrum Silver Tabs (Multiple vitamins-minerals) .... Take 1 tablet by mouth once a day 8)  Eql Fish Oil 1000 Mg Caps (Omega-3 fatty acids) .... 2 caps daily 9)  Amlodipine Besylate 10 Mg Tabs (Amlodipine besylate) .... One by mouth qday 10)  Lantus Solostar 100 Unit/ml Soln (Insulin glargine) .... 20 -30 units once daily subcutaneously 11)  Relion Pen Needles 31g X 8 Mm Misc (Insulin pen needle) .... Use once daily as directed 12)  Bayer Breeze 2 Test Disk (Glucose blood) .... Use three times a day to test blood sugar 13)  Simvastatin 40 Mg Tabs (Simvastatin) .... One by mouth qpm   Patient  Instructions: 1)  Please schedule a follow-up appointment in 4 months. 2)  BMP prior to visit, ICD-9:  401.9 3)  Lipid Panel prior to visit, ICD-9:  272.4 4)  HbgA1C prior to visit, ICD-9: 250.00 5)  Urine Microalbumin prior to visit, ICD-9:  250.00 6)  Please return for lab work one (1) week before your next appointment.  Prescriptions: SIMVASTATIN 40 MG TABS (SIMVASTATIN) one by mouth qpm  #90 x 1   Entered and Authorized by:   D. Thomos Lemons DO   Signed by:   D. Thomos Lemons DO on 04/01/2010   Method used:   Electronically to        [facility] [address] (retail)       [phone]  [place], [place]  [address]       Ph: [phone]       Fax: [phone]   RxID:   [id number]

## 2010-12-15 NOTE — Assessment & Plan Note (Signed)
Summary: 2 week follow up/mhf   Vital Signs:  Patient profile:   55 year old male Weight:      198.50 pounds BMI:     27.02 O2 Sat:      100 % on Room air Temp:     97.7 degrees F oral Pulse rate:   66 / minute Pulse rhythm:   regular Resp:     16 per minute BP sitting:   130 / 60  (right arm) Cuff size:   large  Vitals Entered By: Glendell Docker CMA (January 07, 2010 9:01 AM)  O2 Flow:  Room air CC: RM #- 2 week follow up diabetes management, Type 2 diabetes mellitus follow-up Comments 2 week follow up no concerns, low blood sugar 89 high 409 avg 140, blood sugars are usually elevated in the morning his meals are late  in the evening around 11p, because of his work schedule he eat lates in the night   Primary Care Provider:  D. Thomos Lemons DO  CC:  RM #- 2 week follow up diabetes management and Type 2 diabetes mellitus follow-up.  History of Present Illness:  Type 2 Diabetes Mellitus Follow-Up      This is a 55 year old man who presents for Type 2 diabetes mellitus follow-up.  The patient denies self managed hypoglycemia, hypoglycemia requiring help, and weight gain.  The patient denies the following symptoms: chest pain.  Since the last visit the patient reports good dietary compliance, compliance with medications, and monitoring blood glucose.  409 readings was right after last OV when he did not take any meds x 2 wks.  Allergies (verified): No Known Drug Allergies  Past History:  Past Medical History: Colonic polyps, hx of Diabetes mellitus, type II GERD  Hyperlipidemia     Hypertension Coronary artery disease s/p CABG Peripheral vascular disease (PTA RLE 12/08 [place]) diabetic neuropathy plantar fasciitis FOOT PAIN, RIGHT - chronic (ICD-729.5) ALCOHOL ABUSE, EPISODIC (ICD-305.02)     Past Surgical History: Coronary artery bypass graft(2000-SVG to diagonal, LIMA to LAD, SVG to LCX) PAD surgery knee replacement      PMH-FH-SH reviewed-no changes except  otherwise noted  Family History: Family History High cholesterol Family History Hypertension Family History of Stroke Mother with CAD       Social History: Occupation: Licensed conveyancer Second marriage - 3 children from first marriage Tobacco Use - Yes.   Alcohol Use - yes(rare)       Physical Exam  General:  alert, well-developed, and well-nourished.   Lungs:  normal respiratory effort and normal breath sounds.   Heart:  normal rate, regular rhythm, and no gallop.   Extremities:  No lower extremity edema    Impression & Recommendations:  Problem # 1:  DIABETES MELLITUS, TYPE II (ICD-250.00) Assessment Improved CBGs much better since adding Lantus.  DC actos.  continue metformin.  Pt using between 20-25 units at bedtime.  Pt understands to use less insulin if light dinner to avoid hypoglycemia.  The following medications were removed from the medication list:    Actos 45 Mg Tabs (Pioglitazone hcl) .Marland Kitchen... Take 1 tablet by mouth once a day His updated medication list for this problem includes:    Lisinopril 40 Mg Tabs (Lisinopril) ..... One tab by mouth qd    Bayer Aspirin 325 Mg Tabs (Aspirin) .Marland Kitchen... Take 1 tablet by mouth once a day    Metformin Hcl 1000 Mg Tabs (Metformin hcl) ..... One tab by mouth twice daily  Glimepiride 4 Mg Tabs (Glimepiride) .Marland Kitchen... 1 tab by mouth once daily    Lantus Solostar 100 Unit/ml Soln (Insulin glargine) .Marland KitchenMarland KitchenMarland KitchenMarland Kitchen 15-30 units once daily  Labs Reviewed: Creat: 0.94 (12/24/2009)     Last Eye Exam: normal (06/23/2009) Reviewed HgBA1c results: 7.5 (09/17/2009)  8.9 (07/16/2009)  Problem # 2:  HYPERTENSION (ICD-401.9) Assessment: Improved BP back to baseline.  Maintain current medication regimen.  His updated medication list for this problem includes:    Lisinopril 40 Mg Tabs (Lisinopril) ..... One tab by mouth qd    Metoprolol Tartrate 100 Mg Tabs (Metoprolol tartrate) ..... One tablet twice daily    Amlodipine Besylate 10 Mg Tabs (Amlodipine  besylate) ..... One by mouth qday  BP today: 130/60 Prior BP: 140/90 (12/24/2009)  Labs Reviewed: K+: 5.3 (12/24/2009) Creat: : 0.94 (12/24/2009)   Chol: 104 (02/19/2009)   HDL: 29.90 (02/19/2009)   LDL: 59 (02/19/2009)   TG: 77.0 (02/19/2009)  Complete Medication List: 1)  Lipitor 40 Mg Tabs (Atorvastatin calcium) .... Take 1 tablet by mouth once a day 2)  Lisinopril 40 Mg Tabs (Lisinopril) .... One tab by mouth qd 3)  Metoprolol Tartrate 100 Mg Tabs (Metoprolol tartrate) .... One tablet twice daily 4)  Bayer Aspirin 325 Mg Tabs (Aspirin) .... Take 1 tablet by mouth once a day 5)  Nitrostat 0.4 Mg Subl (Nitroglycerin) .Marland Kitchen.. 1 tab every 5 minutes as needed 6)  Metformin Hcl 1000 Mg Tabs (Metformin hcl) .... One tab by mouth twice daily 7)  Glimepiride 4 Mg Tabs (Glimepiride) .Marland Kitchen.. 1 tab by mouth once daily 8)  Centrum Silver Tabs (Multiple vitamins-minerals) .... Take 1 tablet by mouth once a day 9)  Eql Fish Oil 1000 Mg Caps (Omega-3 fatty acids) .... 2 caps daily 10)  Amlodipine Besylate 10 Mg Tabs (Amlodipine besylate) .... One by mouth qday 11)  Lantus Solostar 100 Unit/ml Soln (Insulin glargine) .Marland KitchenMarland KitchenMarland Kitchen 15-30 units once daily 12)  Relion Pen Needles 31g X 8 Mm Misc (Insulin pen needle) .... Use once daily as directed 13)  Bayer Breeze 2 Test Disk (Glucose blood) .... Use three times a day to test blood sugar  Patient Instructions: 1)  Please schedule a follow-up appointment in 3 months. 2)  BMP prior to visit, ICD-9:  401.9 3)  HbgA1C prior to visit, ICD-9: 250.02 4)  Please return for lab work one (1) week before your next appointment.  Prescriptions: LANTUS SOLOSTAR 100 UNIT/ML SOLN (INSULIN GLARGINE) 15-30 units once daily  #1 month x 5   Entered and Authorized by:   D. Thomos Lemons DO   Signed by:   D. Thomos Lemons DO on 01/07/2010   Method used:   Electronically to        [facility] [address] (retail)       [phone]       [place], [place]  [address]       Ph: [phone]       Fax: [phone]   RxID:   [id number]   Current Allergies (reviewed today): No known allergies

## 2010-12-15 NOTE — Assessment & Plan Note (Signed)
Summary: med check per dr Randall Rampersad/mhf   Vital Signs:  Patient profile:   55 year old male Weight:      192 pounds BMI:     26.13 O2 Sat:      100 % on Room air Temp:     97.9 degrees F oral Pulse rate:   81 / minute Pulse rhythm:   regular Resp:     18 per minute BP sitting:   140 / 90  (right arm) Cuff size:   large  Vitals Entered By: Glendell Docker CMA (December 24, 2009 8:19 AM)  O2 Flow:  Room air   Primary Care Provider:  D. Thomos Lemons DO  CC:  Follow up .  History of Present Illness:  55 y/o white male for f/u.   he is having significant financial issues and he has not been able afford to pay for all meds he has managed to take lipitor and aspirin blood sugars much higher but he is not checking is blood sugars frequently  Allergies (verified): No Known Drug Allergies  Past History:  Past Medical History: Colonic polyps, hx of Diabetes mellitus, type II GERD  Hyperlipidemia    Hypertension Coronary artery disease s/p CABG Peripheral vascular disease (PTA RLE 12/08 [place]) diabetic neuropathy plantar fasciitis FOOT PAIN, RIGHT - chronic (ICD-729.5) ALCOHOL ABUSE, EPISODIC (ICD-305.02)     Past Surgical History: Coronary artery bypass graft(2000-SVG to diagonal, LIMA to LAD, SVG to LCX) PAD surgery knee replacement      Family History: Family History High cholesterol Family History Hypertension Family History of Stroke Mother with CAD      Social History: Occupation: Licensed conveyancer Second marriage - 3 children from first marriage Tobacco Use - Yes.   Alcohol Use - yes(rare)      Physical Exam  General:  alert, well-developed, and well-nourished.   Lungs:  normal respiratory effort and normal breath sounds.   Heart:  normal rate, regular rhythm, and no gallop.   Extremities:  No lower extremity edema    Impression & Recommendations:  Problem # 1:  DIABETES MELLITUS, TYPE II (ICD-250.00) Fasting CBG 364.  he stopped taking meds x 2  weeks.  He lost 10 lbs.  polydipsia.  use lantus samples to lower blood sugar levels.  check BMET - Cr may be elevated.  Pt advised not to start metformin until BMET results back.  we discussed how to titrate lantus  The following medications were removed from the medication list:    Januvia 100 Mg Tabs (Sitagliptin phosphate) ..... One tab by mouth once daily His updated medication list for this problem includes:    Lisinopril 40 Mg Tabs (Lisinopril) ..... One tab by mouth qd    Bayer Aspirin 325 Mg Tabs (Aspirin) .Marland Kitchen... Take 1 tablet by mouth once a day    Metformin Hcl 1000 Mg Tabs (Metformin hcl) ..... One tab by mouth twice daily    Actos 45 Mg Tabs (Pioglitazone hcl) .Marland Kitchen... Take 1 tablet by mouth once a day    Glimepiride 4 Mg Tabs (Glimepiride) .Marland Kitchen... 2 tabs by mouth once daily    Lantus Solostar 100 Unit/ml Soln (Insulin glargine) .Marland KitchenMarland KitchenMarland KitchenMarland Kitchen 15-30 units once daily  Orders: T-Basic Metabolic Panel [id number])  Problem # 2:  HYPERTENSION (ICD-401.9) Pt hasn't taken meds x 2 weeks.  restart at 1/2 dose.  Pt to report any dizziness.  His updated medication list for this problem includes:    Lisinopril 40 Mg Tabs (Lisinopril) ..... One  tab by mouth qd    Metoprolol Tartrate 100 Mg Tabs (Metoprolol tartrate) ..... One tablet twice daily    Amlodipine Besylate 10 Mg Tabs (Amlodipine besylate) ..... One by mouth qday  BP today: 140/90 Prior BP: 126/80 (09/24/2009)  Labs Reviewed: K+: 4.4 (09/17/2009) Creat: : 0.88 (09/17/2009)   Chol: 104 (02/19/2009)   HDL: 29.90 (02/19/2009)   LDL: 59 (02/19/2009)   TG: 77.0 (02/19/2009)  Complete Medication List: 1)  Lipitor 40 Mg Tabs (Atorvastatin calcium) .... Take 1 tablet by mouth once a day 2)  Lisinopril 40 Mg Tabs (Lisinopril) .... One tab by mouth qd 3)  Metoprolol Tartrate 100 Mg Tabs (Metoprolol tartrate) .... One tablet twice daily 4)  Bayer Aspirin 325 Mg Tabs (Aspirin) .... Take 1 tablet by mouth once a day 5)  Nitrostat 0.4 Mg Subl  (Nitroglycerin) .Marland Kitchen.. 1 tab every 5 minutes as needed 6)  Metformin Hcl 1000 Mg Tabs (Metformin hcl) .... One tab by mouth twice daily 7)  Actos 45 Mg Tabs (Pioglitazone hcl) .... Take 1 tablet by mouth once a day 8)  Glimepiride 4 Mg Tabs (Glimepiride) .... 2 tabs by mouth once daily 9)  Centrum Silver Tabs (Multiple vitamins-minerals) .... Take 1 tablet by mouth once a day 10)  Eql Fish Oil 1000 Mg Caps (Omega-3 fatty acids) .... 2 caps daily 11)  Amlodipine Besylate 10 Mg Tabs (Amlodipine besylate) .... One by mouth qday 12)  Lantus Solostar 100 Unit/ml Soln (Insulin glargine) .Marland KitchenMarland KitchenMarland Kitchen 15-30 units once daily 13)  Relion Pen Needles 31g X 8 Mm Misc (Insulin pen needle) .... Use once daily as directed 14)  Bayer Breeze 2 Test Disk (Glucose blood) .... Use three times a day to test blood sugar  Patient Instructions: 1)  Please schedule a follow-up appointment in 2 weeks. 2)  Restart all of your medications at half dose and continue half dose until next office visit. Prescriptions: RELION PEN NEEDLES 31G X 8 MM MISC (INSULIN PEN NEEDLE) use once daily as directed  #50 x 1   Entered and Authorized by:   D. Thomos Lemons DO   Signed by:   D. Thomos Lemons DO on 12/24/2009   Method used:   Print then Give to Patient   RxID:   [id number] AMLODIPINE BESYLATE 10 MG TABS (AMLODIPINE BESYLATE) one by mouth qday  #90 x 3   Entered and Authorized by:   D. Thomos Lemons DO   Signed by:   D. Thomos Lemons DO on 12/24/2009   Method used:   Print then Give to Patient   RxID:   [id number] GLIMEPIRIDE 4 MG TABS (GLIMEPIRIDE) 2 tabs by mouth once daily  #180 x 3   Entered and Authorized by:   D. Thomos Lemons DO   Signed by:   D. Thomos Lemons DO on 12/24/2009   Method used:   Print then Give to Patient   RxID:   [id number] METFORMIN HCL 1000 MG TABS (METFORMIN HCL) one tab by mouth twice daily  #180 x 3   Entered and Authorized by:   D. Thomos Lemons DO   Signed by:   D. Thomos Lemons DO on 12/24/2009   Method  used:   Print then Give to Patient   RxID:   [id number] METOPROLOL TARTRATE 100 MG TABS (METOPROLOL TARTRATE) one tablet twice daily  #180 x 3   Entered and Authorized by:   D. Thomos Lemons DO   Signed by:   D. Thomos Lemons DO on 12/24/2009  Method used:   Print then Give to Patient   RxID:   [id number] LISINOPRIL 40 MG TABS (LISINOPRIL) one tab by mouth qd  #90 x 3   Entered and Authorized by:   D. Thomos Lemons DO   Signed by:   D. Thomos Lemons DO on 12/24/2009   Method used:   Print then Give to Patient   RxID:   [id number]     Current Allergies (reviewed today): No known allergies

## 2010-12-15 NOTE — Assessment & Plan Note (Signed)
Summary: 2 MONTH ROV-CH   Vital Signs:  Patient profile:   55 year old male Weight:      203.75 pounds BMI:     27.73 Temp:     98.2 degrees F oral Pulse rate:   80 / minute Pulse rhythm:   regular Resp:     18 per minute BP sitting:   110 / 70  (left arm) Cuff size:   large  Vitals Entered By: Glendell Docker CMA (July 23, 2009 8:32 AM)  Primary Care Izzabell Klasen:  Dondra Spry DO   History of Present Illness:  Hypertension Follow-Up      This is a 55 year old man who presents for Hypertension follow-up.  The patient denies lightheadedness, headaches, and edema.  The patient denies the following associated symptoms: chest pain.  Compliance with medications (by patient report) has been near 100%.  The patient reports that dietary compliance has been fair.    DM II - A1c worse.  His AM sugars are fairly well controlled.    Allergies: No Known Drug Allergies  Past History:  Past Medical History: Colonic polyps, hx of Diabetes mellitus, type II GERD  Hyperlipidemia  Hypertension Coronary artery disease s/p CABG Peripheral vascular disease (PTA RLE 12/08 [place]) diabetic neuropathy plantar fasciitis FOOT PAIN, RIGHT - chronic (ICD-729.5) ALCOHOL ABUSE, EPISODIC (ICD-305.02)     Past Surgical History: Coronary artery bypass graft(2000-SVG to diagonal, LIMA to LAD, SVG to LCX) PAD surgery knee replacement    Family History: Family History High cholesterol Family History Hypertension Family History of Stroke Mother with CAD    Social History: Occupation: Licensed conveyancer Second marriage - 3 children from first marriage Tobacco Use - Yes.   Alcohol Use - yes(rare)    Physical Exam  General:  alert, well-developed, and well-nourished.   Lungs:  normal respiratory effort and normal breath sounds.   Heart:  normal rate, regular rhythm, and no gallop.   Extremities:  No lower extremity edema   Diabetes Management Exam:    Foot Exam (with socks and/or  shoes not present):       Inspection:          Left foot: normal          Right foot: normal   Impression & Recommendations:  Problem # 1:  DIABETES MELLITUS, TYPE II (ICD-250.00) Pt on maximum doses of oral agents.   We discussed using [place] vs insulin.  We discussed pro and cons.   Pt to improve dietary compliance. Pt's AM blood sugars are well controlled.  I suspect post prandial blood sugars are elevated.  Pt to check 2 hr post prandial blood sugars.  His updated medication list for this problem includes:    Lisinopril 40 Mg Tabs (Lisinopril) ..... One tab by mouth qd    Bayer Aspirin 325 Mg Tabs (Aspirin) .Marland Kitchen... Take 1 tablet by mouth once a day    Januvia 100 Mg Tabs (Sitagliptin phosphate) ..... One tab by mouth once daily    Metformin Hcl 1000 Mg Tabs (Metformin hcl) ..... One tab by mouth twice daily    Actos 45 Mg Tabs (Pioglitazone hcl) .Marland Kitchen... Take 1 tablet by mouth once a day    Glimepiride 4 Mg Tabs (Glimepiride) .Marland Kitchen... 2 tabs by mouth once daily  Labs Reviewed: Creat: 0.72 (07/16/2009)     Last Eye Exam: normal (06/23/2009) Reviewed HgBA1c results: 8.9 (07/16/2009)  7.5 (02/19/2009)  Problem # 2:  HYPERTENSION (ICD-401.9) Assessment: Improved BP  at goal.  Improved with higher dose of amlodipine.  His updated medication list for this problem includes:    Lisinopril 40 Mg Tabs (Lisinopril) ..... One tab by mouth qd    Metoprolol Tartrate 100 Mg Tabs (Metoprolol tartrate) ..... One tablet twice daily    Amlodipine Besylate 10 Mg Tabs (Amlodipine besylate) ..... One by mouth qday  BP today: 110/70 Prior BP: 130/80 (05/21/2009)  Labs Reviewed: K+: 4.8 (07/16/2009) Creat: : 0.72 (07/16/2009)   Chol: 104 (02/19/2009)   HDL: 29.90 (02/19/2009)   LDL: 59 (02/19/2009)   TG: 77.0 (02/19/2009)  Complete Medication List: 1)  Lipitor 40 Mg Tabs (Atorvastatin calcium) .... Take 1 tablet by mouth once a day 2)  Lisinopril 40 Mg Tabs (Lisinopril) .... One tab by mouth qd 3)   Metoprolol Tartrate 100 Mg Tabs (Metoprolol tartrate) .... One tablet twice daily 4)  Bayer Aspirin 325 Mg Tabs (Aspirin) .... Take 1 tablet by mouth once a day 5)  Nitrostat 0.4 Mg Subl (Nitroglycerin) .Marland Kitchen.. 1 tab every 5 minutes as needed 6)  Januvia 100 Mg Tabs (Sitagliptin phosphate) .... One tab by mouth once daily 7)  Metformin Hcl 1000 Mg Tabs (Metformin hcl) .... One tab by mouth twice daily 8)  Actos 45 Mg Tabs (Pioglitazone hcl) .... Take 1 tablet by mouth once a day 9)  Glimepiride 4 Mg Tabs (Glimepiride) .... 2 tabs by mouth once daily 10)  Centrum Silver Tabs (Multiple vitamins-minerals) .... Take 1 tablet by mouth once a day 11)  Eql Fish Oil 1000 Mg Caps (Omega-3 fatty acids) .... 2 caps daily 12)  Amlodipine Besylate 10 Mg Tabs (Amlodipine besylate) .... One by mouth qday  Patient Instructions: 1)  Please schedule a follow-up appointment in 2 months. 2)  BMP prior to visit, ICD-9: 401.9 3)  HbgA1C prior to visit, ICD-9: 250.02 4)  Please return for lab work one (1) week before your next appointment.    Preventive Care Screening  Last Flu Shot:    Date:  07/15/2009    Results:  given   Last Tetanus Booster:    Date:  08/08/2007    Results:  Tdap

## 2010-12-15 NOTE — Assessment & Plan Note (Signed)
Summary: 2 MONTH FOLLOW UP/MHF   Vital Signs:  Patient profile:   55 year old male Weight:      202.50 pounds BMI:     27.56 O2 Sat:      100 % on Room air Temp:     97.9 degrees F oral Pulse rate:   62 / minute Pulse rhythm:   regular Resp:     16 per minute BP sitting:   126 / 80  (left arm) Cuff size:   large  Vitals Entered By: Glendell Docker CMA (September 24, 2009 9:16 AM)  O2 Flow:  Room air  Primary Care Provider:  D. Thomos Lemons DO  CC:  2 Month Follow up and Type 2 diabetes mellitus follow-up.  History of Present Illness: 2 Month Follow up disease management   Type 2 Diabetes Mellitus Follow-Up      This is a 55 year old man who presents for Type 2 diabetes mellitus follow-up.  The patient reports self managed hypoglycemia, but denies hypoglycemia requiring help and weight gain.  The patient denies the following symptoms: chest pain.  Since the last visit the patient reports good dietary compliance, exercising regularly, and monitoring blood glucose.  Low blood sugar 75 high 139 avg 110  Hypertension-stable  Hyperlipidemia-stable  Allergies (verified): No Known Drug Allergies  Past History:  Past Medical History: Colonic polyps, hx of Diabetes mellitus, type II GERD  Hyperlipidemia   Hypertension Coronary artery disease s/p CABG Peripheral vascular disease (PTA RLE 12/08 [place]) diabetic neuropathy plantar fasciitis FOOT PAIN, RIGHT - chronic (ICD-729.5) ALCOHOL ABUSE, EPISODIC (ICD-305.02)     Past Surgical History: Coronary artery bypass graft(2000-SVG to diagonal, LIMA to LAD, SVG to LCX) PAD surgery knee replacement     Family History: Family History High cholesterol Family History Hypertension Family History of Stroke Mother with CAD     Social History: Occupation: Licensed conveyancer Second marriage - 3 children from first marriage Tobacco Use - Yes.   Alcohol Use - yes(rare)     Physical Exam  General:  alert, well-developed, and  well-nourished.   Neck:  supple, no masses, and no carotid bruits.   Lungs:  normal respiratory effort and normal breath sounds.   Heart:  normal rate, regular rhythm, and no gallop.   Abdomen:  soft, non-tender, and normal bowel sounds.   Extremities:  No lower extremity edema   Diabetes Management Exam:    Foot Exam (with socks and/or shoes not present):       Inspection:          Left foot: normal          Right foot: normal   Impression & Recommendations:  Problem # 1:  DIABETES MELLITUS, TYPE II (ICD-250.00) patient has made significant dietary changes. He has also started exercise program. His A1c has improved. He has occasional mild hypoglycemia late morning or early afternoon. Patient advised to decrease glimepiride to 2 mg to avoid hypoglycemia.  Monitor A1c.   We discussed possibility that his diabetes control may progressively worsen due to deterioratio of beta cell function. He may ultimately require insulin therapy. His updated medication list for this problem includes:    Lisinopril 40 Mg Tabs (Lisinopril) ..... One tab by mouth qd    Bayer Aspirin 325 Mg Tabs (Aspirin) .Marland Kitchen... Take 1 tablet by mouth once a day    Januvia 100 Mg Tabs (Sitagliptin phosphate) ..... One tab by mouth once daily    Metformin Hcl 1000 Mg  Tabs (Metformin hcl) ..... One tab by mouth twice daily    Actos 45 Mg Tabs (Pioglitazone hcl) .Marland Kitchen... Take 1 tablet by mouth once a day    Glimepiride 4 Mg Tabs (Glimepiride) .Marland Kitchen... 2 tabs by mouth once daily  Labs Reviewed: Creat: 0.88 (09/17/2009)     Last Eye Exam: normal (06/23/2009) Reviewed HgBA1c results: 7.5 (09/17/2009)  8.9 (07/16/2009)  Problem # 2:  HYPERTENSION (ICD-401.9) well-controlled. Continue current medication regimen. His updated medication list for this problem includes:    Lisinopril 40 Mg Tabs (Lisinopril) ..... One tab by mouth qd    Metoprolol Tartrate 100 Mg Tabs (Metoprolol tartrate) ..... One tablet twice daily    Amlodipine  Besylate 10 Mg Tabs (Amlodipine besylate) ..... One by mouth qday  BP today: 126/80 Prior BP: 110/70 (07/23/2009)  Labs Reviewed: K+: 4.4 (09/17/2009) Creat: : 0.88 (09/17/2009)   Chol: 104 (02/19/2009)   HDL: 29.90 (02/19/2009)   LDL: 59 (02/19/2009)   TG: 77.0 (02/19/2009)  Problem # 3:  HYPERLIPIDEMIA (ICD-272.4) LDL is at goal. His updated medication list for this problem includes:    Lipitor 40 Mg Tabs (Atorvastatin calcium) .Marland Kitchen... Take 1 tablet by mouth once a day  Labs Reviewed: SGOT: 24 (02/19/2009)   SGPT: 21 (02/19/2009)   HDL:29.90 (02/19/2009), 27.6 (09/04/2008)  LDL:59 (02/19/2009), 58 (09/04/2008)  Chol:104 (02/19/2009), 95 (09/04/2008)  Trig:77.0 (02/19/2009), 48 (09/04/2008)  Complete Medication List: 1)  Lipitor 40 Mg Tabs (Atorvastatin calcium) .... Take 1 tablet by mouth once a day 2)  Lisinopril 40 Mg Tabs (Lisinopril) .... One tab by mouth qd 3)  Metoprolol Tartrate 100 Mg Tabs (Metoprolol tartrate) .... One tablet twice daily 4)  Bayer Aspirin 325 Mg Tabs (Aspirin) .... Take 1 tablet by mouth once a day 5)  Nitrostat 0.4 Mg Subl (Nitroglycerin) .Marland Kitchen.. 1 tab every 5 minutes as needed 6)  Januvia 100 Mg Tabs (Sitagliptin phosphate) .... One tab by mouth once daily 7)  Metformin Hcl 1000 Mg Tabs (Metformin hcl) .... One tab by mouth twice daily 8)  Actos 45 Mg Tabs (Pioglitazone hcl) .... Take 1 tablet by mouth once a day 9)  Glimepiride 4 Mg Tabs (Glimepiride) .... 2 tabs by mouth once daily 10)  Centrum Silver Tabs (Multiple vitamins-minerals) .... Take 1 tablet by mouth once a day 11)  Eql Fish Oil 1000 Mg Caps (Omega-3 fatty acids) .... 2 caps daily 12)  Amlodipine Besylate 10 Mg Tabs (Amlodipine besylate) .... One by mouth qday  Patient Instructions: 1)  Please schedule a follow-up appointment in 3 months. 2)  BMP prior to visit, ICD-9: 401.9 3)  HbgA1C prior to visit, ICD-9:  250.02 4)  Urine Microalbumin prior to visit, ICD-9:  250.02 5)  Please return for  lab work one (1) week before your next appointment.  6)  If your blood is low in the afternoon, take 1/2 glimepiride in AM. Prescriptions: METOPROLOL TARTRATE 100 MG TABS (METOPROLOL TARTRATE) one tablet twice daily  #180 x 3   Entered and Authorized by:   D. Thomos Lemons DO   Signed by:   D. Thomos Lemons DO on 09/24/2009   Method used:   Electronically to        [facility] [address] (retail)       [phone]       [place], [place]  [address]       Ph: [phone]       Fax: [phone]  RxID:   [id number]     Current Allergies (reviewed today): No known allergies

## 2010-12-15 NOTE — Progress Notes (Signed)
Summary: Samples  Phone Note Call from Patient   Caller: Spouse Reason for Call: Talk to Doctor Details for Reason: Question Summary of Call: Pt.'s wife called and said that they both were pt's of Dr. Andrey Campanile and Gene [name] has a appt. in July with Dr. Artist Pais. He has already ran out of Januvia 100mg  and Lipitor 40mg . Pt states that Dr. Andrey Campanile usually helps them by giving them samples and was wondering if they could pick up samples of the Januvia and Lipitor to last until they are here in July? Call pt. back and let her know at [phone]. Thank you, Gene [name]  Initial call taken by: Gene [name],  Mar 25, 2009 10:10 AM  Follow-up for Phone Call        ok for samples if avail Follow-up by: D. Thomos Lemons DO,  Mar 25, 2009 1:15 PM  Additional Follow-up for Phone Call Additional follow up Details #1::        attempted to contact patient at [phone] no answer voice message left informing patient  there are no Januvia samples available, Lipitor samples will be left at the front desk for pick up Additional Follow-up by: Gene [name] CMA,  Mar 26, 2009 9:34 AM

## 2010-12-15 NOTE — Miscellaneous (Signed)
Summary: lab orders  Clinical Lists Changes  Orders: Added new Test order of T-Basic Metabolic Panel ([id number]) - Signed Added new Test order of T- Hemoglobin A1C ([id number]) - Signed 

## 2010-12-15 NOTE — Assessment & Plan Note (Signed)
Summary: new pt. to be est., f/u from hospitalization   Vital Signs:  Patient Profile:   55 Years Old Male Height:     72 inches Weight:      198 pounds BMI:     26.95 O2 treatment:    Room Air Temp:     98.3 degrees F oral Pulse rate:   74 / minute Pulse rhythm:   regular Resp:     20 per minute BP sitting:   114 / 80  (left arm) Cuff size:   regular  Vitals Entered By: Darra Lis RMA (August 07, 2008 10:18 AM)             Is Patient Diabetic? Yes      Visit Type:  new patient - acute PCP:  Paulo Fruit MD  Chief Complaint:  Establish care with a new doctor.Marland Kitchen  History of Present Illness: Patient was in the hospital last week because of excessive alcohol intake. Patient had vomiting and dehydration, so was brought to the ER.  Also patient has HTN, DM, hyperlipidemia and had been out of all meds for a long time.  He was admitted to the hospital and initially placed in ICU. He was told that his blood pressure and blood sugars were "out of control".    Patient states that he normally uses alcohol very rarely.  He denies any previous episodes of excessive alcohol intake, black outs or abuse.  He states that he was celebrating that he finally got a job . . . and also that he has been under tremendous stress both personal and financial.  He's not sure why he just kept drinking, but he drank 2 bottles of wine in about 4 hours time.  He began vomiting and felt very sick.  That is when he was taken to the ER.    Since discharge, he has had no complaints.  On discharge, the hospital MD wrote to continue all of his medication.  His blood sugars are running 200 - 275.  He used to be on Glucophage 1,000mg  twice daily - but he was discharged on 500mg  once daily.    He does not have a local PCP and was given an appt with me on discharge from the hospital.    Updated Prior Medication List: LIPITOR 40 MG TABS (ATORVASTATIN CALCIUM) Take 1 tablet by mouth once a day LISINOPRIL 5 MG  TABS (LISINOPRIL) Take 1 tablet by mouth once a day METOPROLOL TARTRATE 100 MG TABS (METOPROLOL TARTRATE) one tablet twice daily BAYER ASPIRIN 325 MG TABS (ASPIRIN) Take 1 tablet by mouth once a day NITROSTAT 0.4 MG SUBL (NITROGLYCERIN) 1 tab every 5 minutes as needed JANUVIA 50 MG TABS (SITAGLIPTIN PHOSPHATE) Take 1 tablet by mouth once a day METFORMIN HCL 500 MG TABS (METFORMIN HCL) Take 1 tablet by mouth once a day ACTOS 45 MG TABS (PIOGLITAZONE HCL) Take 1 tablet by mouth once a day  Current Allergies (reviewed today): No known allergies   Past Medical History:    Colonic polyps, hx of    Diabetes mellitus, type II    GERD    Hyperlipidemia    Hypertension    Coronary artery disease    Peripheral vascular disease  Past Surgical History:    Coronary artery bypass graft    PAD surgery    knee surgery   Family History:    Family History High cholesterol    Family History Hypertension    Family History of Stroke  Social History:    Occupation: Licensed conveyancer    Second marriage - 3 children from first marriage   Risk Factors:  Tobacco use:  quit    Year quit:  06/2008    Pack-years:  35 years x  1 pack per day Passive smoke exposure:  no Drug use:  no HIV high-risk behavior:  no Caffeine use:  2 drinks per day Alcohol use:  yes    Type:  wine / beer - usually 2-3 servings / week    Drinks per day:  0    Has patient --       Felt need to cut down:  no       Been annoyed by complaints:  no       Felt guilty about drinking:  no       Needed eye opener in the morning:  no    Comments:  One episode of excessive intake 07/2008 - hospitalized    Counseled to quit/cut down alcohol use:  yes Exercise:  yes    Times per week:  5    Type:  walking Seatbelt use:  100 % Sun Exposure:  occasionally  Family History Risk Factors:    Family History of MI in females < 53 years old:  no    Family History of MI in males < 39 years old:  no   Review of Systems  The patient  denies anorexia, fever, weight loss, weight gain, vision loss, decreased hearing, hoarseness, chest pain, syncope, dyspnea on exertion, peripheral edema, prolonged cough, headaches, hemoptysis, abdominal pain, melena, hematochezia, severe indigestion/heartburn, hematuria, incontinence, genital sores, muscle weakness, suspicious skin lesions, transient blindness, difficulty walking, depression, unusual weight change, abnormal bleeding, enlarged lymph nodes, angioedema, and testicular masses.     Physical Exam  General:     Well-developed, well nourished, well hydrated, in no acute distress; appropriate and cooperative   Head:     Normocephalic and atraumatic without obvious abnormalities.  Eyes:     No corneal or conjunctival inflammation. EOMI. PERRLA.  Vision grossly normal. Ears:     External ear shows no significant lesions or deformities.  Otoscopic examination reveals clear canals, tympanic membranes normal bilaterally. Hearing is grossly normal. Nose:     External nasal examination shows no deformity or inflammation. Nasal mucosa are pink and moist without lesions or exudates. Mouth:     Oral mucosa and oropharynx without lesions, erythema or exudates.  Teeth in good repair. no postnasal drip and no tongue abnormalities.   Neck:     supple, full ROM, no masses, and no thyromegaly.   Chest Wall:     No deformities, masses, or tenderness noted. Lungs:     Normal respiratory effort, chest expands symmetrically with good air flow noted. Lungs clear to auscultation with no crackles, rales or wheezes.   Heart:     Normal rate and  rhythm. S1 and S2 normal, without gallop, murmur, click, rub  Abdomen:     Bowel sounds positive, abdomen soft and non-tender without masses, organomegaly or hernias noted. no distention  Msk:     No gross deformity noted of cervical, thoracic or lumbar spine. normal ROM. no muscle atrophy noted.   Extremities:     No clubbing, cyanosis, edema, or deformity  noted, grossly normal full range of motion with all joints.   Neurologic:     alert & oriented X3,  gait normal.  no deficit in strength noted, no balance problems noted, neuro  is grossly intact Skin:     Intact without suspicious lesions or rashes Psych:     Cognition and judgment appear intact. Alert and cooperative with normal attention span and concentration. No apparent delusions, illusions, hallucinations, normally interactive, good eye contact, not anxious or depressed appearing, and not agitated.       Impression & Recommendations:  Problem # 1:  DIABETES MELLITUS, TYPE II (ICD-250.00) Will increase Metformin to 500mg  two times a day.  If after 3 days, the BGM still >200,  he is to increase to to two tabs in am and one in pm.  reviewed DM goals and use of meds.  All questions answered.  Patient to get labs in one month.  Order given.  More than 45 minutes spent with patient today  His updated medication list for this problem includes:    Lisinopril 5 Mg Tabs (Lisinopril) .Marland Kitchen... Take 1 tablet by mouth once a day    Bayer Aspirin 325 Mg Tabs (Aspirin) .Marland Kitchen... Take 1 tablet by mouth once a day    Januvia 50 Mg Tabs (Sitagliptin phosphate) .Marland Kitchen... Take 1 tablet by mouth once a day    Metformin Hcl 500 Mg Tabs (Metformin hcl) .Marland Kitchen... Take 2  tablet by mouth in the am, 1 tablet in the pm    Actos 45 Mg Tabs (Pioglitazone hcl) .Marland Kitchen... Take 1 tablet by mouth once a day    Glimepiride 2 Mg Tabs (Glimepiride) ..... One tab by mouth each day   Problem # 2:  HYPERTENSION (ICD-401.9) Under good control.  Continue same meds. His updated medication list for this problem includes:    Lisinopril 5 Mg Tabs (Lisinopril) .Marland Kitchen... Take 1 tablet by mouth once a day    Metoprolol Tartrate 100 Mg Tabs (Metoprolol tartrate) ..... One tablet twice daily   Problem # 3:  HYPERLIPIDEMIA (ICD-272.4) Labs to be done in one month. His updated medication list for this problem includes:    Lipitor 40 Mg Tabs  (Atorvastatin calcium) .Marland Kitchen... Take 1 tablet by mouth once a day   Problem # 4:  CORONARY ARTERY DISEASE (ICD-414.00) And h/o CABG - patient saw cardiology today. His updated medication list for this problem includes:    Lisinopril 5 Mg Tabs (Lisinopril) .Marland Kitchen... Take 1 tablet by mouth once a day    Metoprolol Tartrate 100 Mg Tabs (Metoprolol tartrate) ..... One tablet twice daily    Bayer Aspirin 325 Mg Tabs (Aspirin) .Marland Kitchen... Take 1 tablet by mouth once a day    Nitrostat 0.4 Mg Subl (Nitroglycerin) .Marland Kitchen... 1 tab every 5 minutes as needed   Problem # 5:  ALCOHOL ABUSE, EPISODIC (ICD-305.02) Long d/w patient about alcohol abuse and need to abstain.  Recommended AA.    Complete Medication List: 1)  Lipitor 40 Mg Tabs (Atorvastatin calcium) .... Take 1 tablet by mouth once a day 2)  Lisinopril 5 Mg Tabs (Lisinopril) .... Take 1 tablet by mouth once a day 3)  Metoprolol Tartrate 100 Mg Tabs (Metoprolol tartrate) .... One tablet twice daily 4)  Bayer Aspirin 325 Mg Tabs (Aspirin) .... Take 1 tablet by mouth once a day 5)  Nitrostat 0.4 Mg Subl (Nitroglycerin) .Marland Kitchen.. 1 tab every 5 minutes as needed 6)  Januvia 50 Mg Tabs (Sitagliptin phosphate) .... Take 1 tablet by mouth once a day 7)  Metformin Hcl 500 Mg Tabs (Metformin hcl) .... Take 2  tablet by mouth in the am, 1 tablet in the pm 8)  Actos 45 Mg Tabs (Pioglitazone  hcl) .... Take 1 tablet by mouth once a day 9)  Glimepiride 2 Mg Tabs (Glimepiride) .... One tab by mouth each day   Patient Instructions: 1)  Please schedule a follow-up appointment in 2 weeks.   Prescriptions: JANUVIA 50 MG TABS (SITAGLIPTIN PHOSPHATE) Take 1 tablet by mouth once a day  #30 x 5   Entered and Authorized by:   Paulo Fruit MD   Signed by:   Paulo Fruit MD on 08/07/2008   Method used:   Electronically to        [facility] [address] (retail)       [phone]       [place], [place]  [address]       Ph: [phone]        Fax: [phone]   RxID:   [id number] GLIMEPIRIDE 2 MG TABS (GLIMEPIRIDE) one tab by mouth each day  #30 x 5   Entered and Authorized by:   Paulo Fruit MD   Signed by:   Paulo Fruit MD on 08/07/2008   Method used:   Electronically to        [facility] [address] (retail)       [phone]       [place], [place]  [address]       Ph: [phone]       Fax: [phone]   RxID:   [id number] METFORMIN HCL 500 MG TABS (METFORMIN HCL) Take 2  tablet by mouth in the am, 1 tablet in the pm  #90 x 1   Entered and Authorized by:   Paulo Fruit MD   Signed by:   Paulo Fruit MD on 08/07/2008   Method used:   Electronically to        [facility] [address] (retail)       [phone]       [place], [place]  [address]       Ph: [phone]       Fax: [phone]   RxID:   [id number]  ]  Influenza Vaccine    Vaccine Type: Fluvax Non-MCR    Site: left deltoid    Mfr: GlaxoSmithKline    Dose: 0.5 ml    Route: IM    Given by: Darra Lis RMA    Exp. Date: 05/14/2009    Lot #: [id number]  Flu Vaccine Consent Questions    Do you have a history of severe allergic reactions to this vaccine? no    Any prior history of allergic reactions to egg and/or gelatin? no    Do you have a sensitivity to the preservative Thimersol? no    Do you have a past history of Guillan-Barre Syndrome? no    Do you currently have an acute febrile illness? no    Have you ever had a severe reaction to latex? no    Vaccine information given and explained to patient? yes   Appended Document: new pt. to be est., f/u from hospitalization    Clinical Lists Changes  Observations: Added new observation of COLONOSCOPY: polyps - repeat in 5 years (03/15/2007 13:21)

## 2010-12-15 NOTE — Progress Notes (Signed)
Summary: Medical Letter  Phone Note Call from Patient Call back at Home Phone [phone]   Caller: Patient Call For: D. Thomos Lemons DO Summary of Call: Patient called and left voice message stating he is in [place] in litigation with his ex wife to have his alimony reduced. His message states he will need a  detailed letter from Dr Artist Pais stating that he is currently being treated by him and his diagnosis and medical history.  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 He states that he has been unable to fully perform his job duties and therefore had to take early retirement. He asked that the letter states whatever his current health problems are and what the long term affects could be if not treated.  Initial call taken by: Glendell Docker CMA,  April 29, 2010 2:39 PM  Follow-up for Phone Call        please inform pt - letter may take up to 2 weeks to complete, and there will be $50 dollar fee Follow-up by: D. Thomos Lemons DO,  May 01, 2010 1:16 PM  Additional Follow-up for Phone Call Additional follow up Details #1::        Attempted to contact pt at home number, line busy.  Mervin Kung CMA  May 01, 2010 1:57 PM   Left message on machine to have pt return my call. Mervin Kung CMA  May 01, 2010 2:57 PM     Additional Follow-up for Phone Call Additional follow up Details #2::    Pt   returned  call informed patient as directed   Follow-up by: Darral Dash,  May 04, 2010 9:46 AM  Additional Follow-up for Phone Call Additional follow up Details #3:: Details for Additional Follow-up Action Taken: Advised pt letter is not ready, advised pt we will call when it is ready, pt is aware there is a $50 fee for the letter Diane Tomerlin  May 22, 2010 3:41 PM  +

## 2010-12-15 NOTE — Assessment & Plan Note (Signed)
Summary: ROV-CH   Vital Signs:  Patient Profile:   55 Years Old Male Height:     72 inches Weight:      207 pounds BMI:     28.18 O2 treatment:    Room Air Temp:     97.6 degrees F oral Pulse rate:   78 / minute Pulse rhythm:   regular Resp:     18 per minute BP sitting:   155 / 90  (right arm) Cuff size:   regular  Vitals Entered By: Darra Lis RMA (January 15, 2009 9:29 AM)                 Visit Type:  follow up PCP:  Paulo Fruit MD  Chief Complaint:  follow up.  History of Present Illness: At last appointment, we increased his lisinopril.  BP better, but not yet to goal.  Patient sees Dr. Jens Som for CAD s/p CABG - he is using lipitor for hyperlipidemia - labs UTD.    At last appt, we increased his Januvia.  His BGM are running 100-130  range. doing better with following a diabetic diet.  patient does not want to start insulin and does not want an endocrinology referral.  I spend a great deal of time talking with him about compliance with diet / exercise and the need to gain better control.  also about the fact that insulin is not a failure and is an excellent medication for diabetes.  He is not willing to consider it at this time.  He seems motivated, and he is doing better, but we have very little room to change his oral meds.   FOOT PAIN, RIGHT and DIABETIC PERIPHERAL NEUROPATHY - patient sees podiatry and both issues have improved.  ALCOHOL ABUSE, EPISODIC - patient has only drank a couple of  times in the past few months and he states that it was not to excess.  He does not want to stop the alcohol use or go to AA.  GERD - under good control at current time using natural methods.  no other concerns.         Updated Prior Medication List: LIPITOR 40 MG TABS (ATORVASTATIN CALCIUM) Take 1 tablet by mouth once a day LISINOPRIL 40 MG TABS (LISINOPRIL) one tab by mouth qd METOPROLOL TARTRATE 100 MG TABS (METOPROLOL TARTRATE) one tablet twice daily BAYER  ASPIRIN 325 MG TABS (ASPIRIN) Take 1 tablet by mouth once a day NITROSTAT 0.4 MG SUBL (NITROGLYCERIN) 1 tab every 5 minutes as needed JANUVIA 100 MG TABS (SITAGLIPTIN PHOSPHATE) one tab by mouth once daily METFORMIN HCL 1000 MG TABS (METFORMIN HCL) one tab twice by mouth twice daily ACTOS 45 MG TABS (PIOGLITAZONE HCL) Take 1 tablet by mouth once a day GLIMEPIRIDE 4 MG TABS (GLIMEPIRIDE) 2 tabs by mouth once daily MELOXICAM 7.5 MG TABS (MELOXICAM) Take 1 tablet by mouth once a day CENTRUM SILVER  TABS (MULTIPLE VITAMINS-MINERALS) Take 1 tablet by mouth once a day EQL FISH OIL 1000 MG CAPS (OMEGA-3 FATTY ACIDS) 2 caps daily  Current Allergies (reviewed today): No known allergies   Past Medical History:    Reviewed history from 11/18/2008 and no changes required:       Colonic polyps, hx of       Diabetes mellitus, type II       GERD       Hyperlipidemia       Hypertension       Coronary artery disease s/p CABG  Peripheral vascular disease (PTA RLE 12/08 [place])       diabetic neuropathy       plantar fasciitis       FOOT PAIN, RIGHT - chronic (ICD-729.5)       ALCOHOL ABUSE, EPISODIC (ICD-305.02)                 Past Surgical History:    Reviewed history from 10/30/2008 and no changes required:       Coronary artery bypass graft(2000-SVG to diagonal, LIMA to LAD, SVG to LCX)       PAD surgery       knee replacement     Review of Systems       no nausea / vomiting / diarrhea / fever / chills / chest pain / SOB    Physical Exam  General:     Well-developed, well nourished, well hydrated, in no acute distress; appropriate and cooperative   Head:     Normocephalic and atraumatic without obvious abnormalities.  Eyes:     No corneal or conjunctival inflammation. Vision grossly normal. Ears:     External ear shows no significant lesions or deformities.  Hearing is grossly normal. Nose:     External nasal examination shows no deformity or inflammation.  Neck:      supple, full ROM Lungs:     Normal respiratory effort, chest expands symmetrically with good air flow noted. Lungs clear to auscultation with no crackles, rales or wheezes.   Heart:     Normal rate and  rhythm. S1 and S2 normal, without gallop, murmur, click, rub  Extremities:     No cyanosis, edema, or deformity noted, grossly normal full range of motion with all joints.  Neurologic:     alert & oriented X3,  gait normal.  no deficit in strength noted, no balance problems noted, neuro is grossly intact Skin:     Intact without suspicious lesions or rashes Psych:     Cognition and judgment appear intact. Alert and cooperative with normal attention span and concentration. No apparent delusions, illusions, hallucinations, normally interactive, good eye contact, not anxious or depressed appearing, and not agitated.       Impression & Recommendations:  Problem # 1:  HYPERTENSION (ICD-401.9) will add small dose of norvasc and follow up in one month.  will be due for labs. His updated medication list for this problem includes:    Lisinopril 40 Mg Tabs (Lisinopril) ..... One tab by mouth qd    Metoprolol Tartrate 100 Mg Tabs (Metoprolol tartrate) ..... One tablet twice daily    Amlodipine Besylate 2.5 Mg Tabs (Amlodipine besylate) ..... One tab by mouth once daily   Problem # 2:  DIABETES MELLITUS, TYPE II (ICD-250.00) continue current meds.  work on diet / exercise.  due for labs at one month follow up. His updated medication list for this problem includes:    Lisinopril 40 Mg Tabs (Lisinopril) ..... One tab by mouth qd    Bayer Aspirin 325 Mg Tabs (Aspirin) .Marland Kitchen... Take 1 tablet by mouth once a day    Januvia 100 Mg Tabs (Sitagliptin phosphate) ..... One tab by mouth once daily    Metformin Hcl 1000 Mg Tabs (Metformin hcl) ..... One tab twice by mouth twice daily    Actos 45 Mg Tabs (Pioglitazone hcl) .Marland Kitchen... Take 1 tablet by mouth once a day    Glimepiride 4 Mg Tabs (Glimepiride) .Marland Kitchen... 2  tabs by mouth once daily   Problem #  3:  HYPERLIPIDEMIA (ICD-272.4) continue current meds.  due for labs at one month follow up  His updated medication list for this problem includes:    Lipitor 40 Mg Tabs (Atorvastatin calcium) .Marland Kitchen... Take 1 tablet by mouth once a day   Problem # 4:  FOOT PAIN, RIGHT (ICD-729.5) continue with plan per podiatry and keep follow up   Problem # 5:  GERD (ICD-530.81) doing well  Complete Medication List: 1)  Lipitor 40 Mg Tabs (Atorvastatin calcium) .... Take 1 tablet by mouth once a day 2)  Lisinopril 40 Mg Tabs (Lisinopril) .... One tab by mouth qd 3)  Metoprolol Tartrate 100 Mg Tabs (Metoprolol tartrate) .... One tablet twice daily 4)  Bayer Aspirin 325 Mg Tabs (Aspirin) .... Take 1 tablet by mouth once a day 5)  Nitrostat 0.4 Mg Subl (Nitroglycerin) .Marland Kitchen.. 1 tab every 5 minutes as needed 6)  Januvia 100 Mg Tabs (Sitagliptin phosphate) .... One tab by mouth once daily 7)  Metformin Hcl 1000 Mg Tabs (Metformin hcl) .... One tab twice by mouth twice daily 8)  Actos 45 Mg Tabs (Pioglitazone hcl) .... Take 1 tablet by mouth once a day 9)  Glimepiride 4 Mg Tabs (Glimepiride) .... 2 tabs by mouth once daily 10)  Meloxicam 7.5 Mg Tabs (Meloxicam) .... Take 1 tablet by mouth once a day 11)  Centrum Silver Tabs (Multiple vitamins-minerals) .... Take 1 tablet by mouth once a day 12)  Eql Fish Oil 1000 Mg Caps (Omega-3 fatty acids) .... 2 caps daily 13)  Amlodipine Besylate 2.5 Mg Tabs (Amlodipine besylate) .... One tab by mouth once daily   Patient Instructions: 1)  Please schedule a follow-up appointment in 1 month with MD in the am / fasting.  NOT just a lab appt.    Prescriptions: AMLODIPINE BESYLATE 2.5 MG TABS (AMLODIPINE BESYLATE) one tab by mouth once daily  #30 x 1   Entered and Authorized by:   Paulo Fruit MD   Signed by:   Paulo Fruit MD on 01/15/2009   Method used:   Electronically to        [facility] [address] (retail)        [phone]       [place], [place]  [address]       Ph: [phone]       Fax: [phone]   RxID:   [id number]

## 2010-12-15 NOTE — Progress Notes (Signed)
Summary: Lipitor Samples  Phone Note Call from Patient Call back at Home Phone [phone]   Caller: pt live Call For: Artist Pais  Summary of Call: The only thing on the med list that the patient has is "Lipitor.  They cannot afford the rest of the meds.  If you have smaples of any of them it would be appreciated Initial call taken by: Roselle Locus,  December 22, 2009 10:14 AM  Follow-up for Phone Call        see if pt qualifys for pt assistance from drug manufacturers.  I suggest OV to discuss whether we can change meds Follow-up by: D. Thomos Lemons DO,  December 22, 2009 1:01 PM  Additional Follow-up for Phone Call Additional follow up Details #1::        Left a detailed mess. for pt. to call back to the office to schedule a f/u appt. with Dr.Yoo Additional Follow-up by: Michaelle Copas,  December 22, 2009 2:51 PM

## 2010-12-15 NOTE — Progress Notes (Signed)
  Phone Note Outgoing Call   Summary of Call: call pt - kidney function normal.  restart metformin Initial call taken by: D. Thomos Lemons DO,  December 25, 2009 8:17 AM  Follow-up for Phone Call        informed pt. that Kidney Function Test is normal and told pt. to restart Metformin. Thanks  Follow-up by: Michaelle Copas,  December 25, 2009 10:19 AM

## 2010-12-15 NOTE — Assessment & Plan Note (Signed)
Summary: 1 MONTH ROV-CH   Vital Signs:  Patient Profile:   55 Years Old Gene [name] Height:     72 inches Weight:      208 pounds BMI:     28.31 O2 treatment:    Room Air Temp:     97.9 degrees F oral Pulse rate:   80 / minute Pulse rhythm:   regular Resp:     18 per minute BP sitting:   160 / 94  (left arm) Cuff size:   regular  Vitals Entered By: Darra Lis RMA (December 23, 2008 8:46 AM)                 Visit Type:  follow up PCP:  Paulo Fruit MD  Chief Complaint:  follow up .  History of Present Illness: At last appointment, the patient ran out of metoprolol.  patient is rather non-complaint with medication.  he frequently runs out of meds and does not call for refills . . . or he has refills and simply does not "get around" to picking them up.  A great deal of time was spent at the last appointment talking to the patient about non-complaince.  Today, he brings all of his medication in for review and is taking everything as prescribed.  His wife is with him and they are re-dedicated to keeping him compliant with medication.   Patient with a history of  HTN - home readings running 145-165/85-95   Patient sees Dr. Jens Som for CAD s/p CABG - he is using lipitor for hyperlipidemia - labs UTD.    His BGM are running 130-160 range. Not following a diabetic diet.  patient does not want to start insulin and does not want an endocrinology referral.  I spend a great deal of time talking with him about compliance with diet / exercise and the need to gain better control.  also about the fact that insulin is not a failure and is an excellent medication for diabetes.  He is not willing to consider it at this time.  We have only a little more room to increase his oral meds . . . .   FOOT PAIN, RIGHT and DIABETIC PERIPHERAL NEUROPATHY - patient sees podiatry and both issues have improved.  ALCOHOL ABUSE, EPISODIC - patient has only drank a couple of  times in the past few months and he  states that it was not to excess.  He does not want to stop the alcohol use or go to AA.  GERD - under good control at current time using natural methods.         Updated Prior Medication List: LIPITOR 40 MG TABS (ATORVASTATIN CALCIUM) Take 1 tablet by mouth once a day LISINOPRIL 20 MG TABS (LISINOPRIL) one tab by mouth once daily METOPROLOL TARTRATE 100 MG TABS (METOPROLOL TARTRATE) one tablet twice daily BAYER ASPIRIN 325 MG TABS (ASPIRIN) Take 1 tablet by mouth once a day NITROSTAT 0.4 MG SUBL (NITROGLYCERIN) 1 tab every 5 minutes as needed JANUVIA 50 MG TABS (SITAGLIPTIN PHOSPHATE) Take 1 tablet by mouth once a day METFORMIN HCL 1000 MG TABS (METFORMIN HCL) one tab twice by mouth twice daily ACTOS 45 MG TABS (PIOGLITAZONE HCL) Take 1 tablet by mouth once a day GLIMEPIRIDE 4 MG TABS (GLIMEPIRIDE) 2 tabs by mouth once daily MELOXICAM 7.5 MG TABS (MELOXICAM) Take 1 tablet by mouth once a day CENTRUM SILVER  TABS (MULTIPLE VITAMINS-MINERALS) Take 1 tablet by mouth once a day EQL FISH OIL  1000 MG CAPS (OMEGA-3 FATTY ACIDS) 2 caps daily  Current Allergies (reviewed today): No known allergies   Past Medical History:    Reviewed history from 11/18/2008 and no changes required:       Colonic polyps, hx of       Diabetes mellitus, type II       GERD       Hyperlipidemia       Hypertension       Coronary artery disease s/p CABG       Peripheral vascular disease (PTA RLE 12/08 [place])       diabetic neuropathy       plantar fasciitis       FOOT PAIN, RIGHT - chronic (ICD-729.5)       ALCOHOL ABUSE, EPISODIC (ICD-305.02)                 Past Surgical History:    Reviewed history from 10/30/2008 and no changes required:       Coronary artery bypass graft(2000-SVG to diagonal, LIMA to LAD, SVG to LCX)       PAD surgery       knee replacement     Review of Systems       no nausea / vomiting / diarrhea / fever / chills / chest pain / SOB    Physical Exam  General:      Well-developed, well nourished, well hydrated, in no acute distress; appropriate and cooperative   Head:     Normocephalic and atraumatic without obvious abnormalities.  Eyes:     No corneal or conjunctival inflammation. Vision grossly normal. Ears:     External ear shows no significant lesions or deformities.  Hearing is grossly normal. Nose:     External nasal examination shows no deformity or inflammation.  Neck:     supple, full ROM Lungs:     Normal respiratory effort, chest expands symmetrically with good air flow noted. Lungs clear to auscultation with no crackles, rales or wheezes.   Heart:     Normal rate and  rhythm. S1 and S2 normal, without gallop, murmur, click, rub  Extremities:     No clubbing, cyanosis, edema, or deformity noted, grossly normal full range of motion with all joints.  Neurologic:     alert & oriented X3,  gait normal.  no deficit in strength noted, no balance problems noted, neuro is grossly intact Skin:     Intact without suspicious lesions or rashes Psych:     Cognition and judgment appear intact. Alert and cooperative with normal attention span and concentration. No apparent delusions, illusions, hallucinations, normally interactive, good eye contact, not anxious or depressed appearing, and not agitated.       Impression & Recommendations:  Problem # 1:  DIABETES MELLITUS, TYPE II (ICD-250.00) increase januvia and lisinopril.  follow up in one month.  as per HPI, long discussion with patient about insulin and endocrinology referral - patient not willing to consider at this time, but will continue the conversation at follow up appointment.  will be due for diabetic labs at follow up. His updated medication list for this problem includes:    Lisinopril 40 Mg Tabs (Lisinopril) ..... One tab by mouth qd    Bayer Aspirin 325 Mg Tabs (Aspirin) .Marland Kitchen... Take 1 tablet by mouth once a day    Januvia 100 Mg Tabs (Sitagliptin phosphate) ..... One tab by mouth  once daily    Metformin Hcl 1000 Mg Tabs (Metformin  hcl) ..... One tab twice by mouth twice daily    Actos 45 Mg Tabs (Pioglitazone hcl) .Marland Kitchen... Take 1 tablet by mouth once a day    Glimepiride 4 Mg Tabs (Glimepiride) .Marland Kitchen... 2 tabs by mouth once daily   Problem # 2:  HYPERTENSION (ICD-401.9) increase lisinopril.  follow up in one month.  repeat bmp at follow up. His updated medication list for this problem includes:    Lisinopril 40 Mg Tabs (Lisinopril) ..... One tab by mouth qd    Metoprolol Tartrate 100 Mg Tabs (Metoprolol tartrate) ..... One tablet twice daily   Problem # 3:  HYPERLIPIDEMIA (ICD-272.4) continue current meds.  not due for labs until after April. His updated medication list for this problem includes:    Lipitor 40 Mg Tabs (Atorvastatin calcium) .Marland Kitchen... Take 1 tablet by mouth once a day   Problem # 4:  FOOT PAIN, RIGHT (ICD-729.5) improved - continue with podiatry  Problem # 5:  GERD (ICD-530.81) under good control using diet / behavior changes.  Patient to call if symptoms increase / change / persist or any concerns.   Problem # 6:  ALCOHOL ABUSE, EPISODIC (ICD-305.02) patient unwilling to quit alcohol or consider AA.  he has cut back on his drinking per his report  Problem # 7:  PERS HX NONCOMPLIANCE W/MED TX PRS HAZARDS HLTH (ICD-V15.81) patient has shown improvement in this area, but still with lots of room to improve.  will continue to encourage, educate and inform  Complete Medication List: 1)  Lipitor 40 Mg Tabs (Atorvastatin calcium) .... Take 1 tablet by mouth once a day 2)  Lisinopril 40 Mg Tabs (Lisinopril) .... One tab by mouth qd 3)  Metoprolol Tartrate 100 Mg Tabs (Metoprolol tartrate) .... One tablet twice daily 4)  Bayer Aspirin 325 Mg Tabs (Aspirin) .... Take 1 tablet by mouth once a day 5)  Nitrostat 0.4 Mg Subl (Nitroglycerin) .Marland Kitchen.. 1 tab every 5 minutes as needed 6)  Januvia 100 Mg Tabs (Sitagliptin phosphate) .... One tab by mouth once daily 7)   Metformin Hcl 1000 Mg Tabs (Metformin hcl) .... One tab twice by mouth twice daily 8)  Actos 45 Mg Tabs (Pioglitazone hcl) .... Take 1 tablet by mouth once a day 9)  Glimepiride 4 Mg Tabs (Glimepiride) .... 2 tabs by mouth once daily 10)  Meloxicam 7.5 Mg Tabs (Meloxicam) .... Take 1 tablet by mouth once a day 11)  Centrum Silver Tabs (Multiple vitamins-minerals) .... Take 1 tablet by mouth once a day 12)  Eql Fish Oil 1000 Mg Caps (Omega-3 fatty acids) .... 2 caps daily   Patient Instructions: 1)  Please schedule a follow-up appointment in 1 month.   Prescriptions: JANUVIA 100 MG TABS (SITAGLIPTIN PHOSPHATE) one tab by mouth once daily  #30 x 1   Entered and Authorized by:   Paulo Fruit MD   Signed by:   Paulo Fruit MD on 12/23/2008   Method used:   Electronically to        [facility] [address] (retail)       [phone]       [place], [place]  [address]       Ph: [phone]       Fax: [phone]   RxID:   [id number] LISINOPRIL 40 MG TABS (LISINOPRIL) one tab by mouth qd  #30 x 1   Entered and Authorized by:   Paulo Fruit MD   Signed  by:   Paulo Fruit MD on 12/23/2008   Method used:   Electronically to        [facility] [address] (retail)       [phone]       [place], [place]  [address]       Ph: [phone]       Fax: [phone]   RxID:   [id number]

## 2010-12-15 NOTE — Medication Information (Signed)
Summary: Possible Nonadherence with Metformin/[employer]  Possible Nonadherence with Metformin/[employer]BS   Imported By: Lanelle Bal 10/01/2009 09:04:00  _____________________________________________________________________  External Attachment:    Type:   Image     Comment:   External Document

## 2010-12-15 NOTE — Progress Notes (Signed)
Summary: Breeze Disc refill  Phone Note Refill Request Message from:  Fax from Pharmacy on December 26, 2009 8:13 AM  Refills Requested: Medication #1:  breeze 2 test disk dis qty 100   Dosage confirmed as above?Dosage Confirmed   Brand Name Necessary? No   Supply Requested: 1 month   Last Refilled: 01/26/2009  Method Requested: Electronic Next Appointment Scheduled: 2-16-11am Dr Artist Pais  Initial call taken by: Roselle Locus,  December 26, 2009 8:14 AM  Follow-up for Phone Call        call placed to patient [phone], no answer. message left for patient to return call- for verification of rx refill request. Verification of meter type is needed Follow-up by: Glendell Docker CMA,  December 26, 2009 8:30 AM  Additional Follow-up for Phone Call Additional follow up Details #1::        patient of wife called back to verify testing supplies needed. Rx sent to pharmacy Additional Follow-up by: Glendell Docker CMA,  December 26, 2009 8:55 AM    New/Updated Medications: BAYER BREEZE 2 TEST  DISK (GLUCOSE BLOOD) use three times a day to test blood sugar Prescriptions: BAYER BREEZE 2 TEST  DISK (GLUCOSE BLOOD) use three times a day to test blood sugar  #90 x 3   Entered by:   Glendell Docker CMA   Authorized by:   D. Thomos Lemons DO   Signed by:   Glendell Docker CMA on 12/26/2009   Method used:   Electronically to        [facility] [address] (retail)       [phone]       [place], [place]  [address]       Ph: [phone]       Fax: [phone]   RxID:   [id number]

## 2010-12-15 NOTE — Assessment & Plan Note (Signed)
Summary: 1 month fasting apt-ch   Vital Signs:  Patient profile:   55 year old male Height:      72 inches Weight:      207 pounds BMI:     28.18 Temp:     97.8 degrees F oral Pulse rate:   80 / minute Pulse rhythm:   regular Resp:     18 per minute BP sitting:   145 / 84  (right arm) Cuff size:   regular  Vitals Entered By: Darra Lis RMA (February 19, 2009 9:08 AM) CC: follow-up visit   History of Present Illness: At last appointment, we added norvasc - low dose.  BP better, but not yet to goal.  Patient sees Dr. Jens Som for CAD s/p CABG - he is using lipitor for hyperlipidemia - labs UTD.    We recently increased his Januvia.  His BGM are running 100-130  range. doing better with following a diabetic diet.  patient does not want to start insulin and does not want an endocrinology referral.  I spend a great deal of time talking with him about compliance with diet / exercise and the need to gain better control.  also about the fact that insulin is not a failure and is an excellent medication for diabetes.  He is not willing to consider it at this time.  He seems motivated, and he is doing better, but we have very little room to change his oral meds.   FOOT PAIN, RIGHT and DIABETIC PERIPHERAL NEUROPATHY - patient sees podiatry and both issues have improved.  GERD - under good control at current time using natural methods.  hyperlipidemia - patient due for labs  no other concerns.       Current Medications (verified): 1)  Lipitor 40 Mg Tabs (Atorvastatin Calcium) .... Take 1 Tablet By Mouth Once A Day 2)  Lisinopril 40 Mg Tabs (Lisinopril) .... One Tab By Mouth Qd 3)  Metoprolol Tartrate 100 Mg Tabs (Metoprolol Tartrate) .... One Tablet Twice Daily 4)  Bayer Aspirin 325 Mg Tabs (Aspirin) .... Take 1 Tablet By Mouth Once A Day 5)  Nitrostat 0.4 Mg Subl (Nitroglycerin) .Marland Kitchen.. 1 Tab Every 5 Minutes As Needed 6)  Januvia 100 Mg Tabs (Sitagliptin Phosphate) .... One Tab By  Mouth Once Daily 7)  Metformin Hcl 1000 Mg Tabs (Metformin Hcl) .... One Tab Twice By Mouth Twice Daily 8)  Actos 45 Mg Tabs (Pioglitazone Hcl) .... Take 1 Tablet By Mouth Once A Day 9)  Glimepiride 4 Mg Tabs (Glimepiride) .... 2 Tabs By Mouth Once Daily 10)  Meloxicam 7.5 Mg Tabs (Meloxicam) .... Take 1 Tablet By Mouth Once A Day 11)  Centrum Silver  Tabs (Multiple Vitamins-Minerals) .... Take 1 Tablet By Mouth Once A Day 12)  Eql Fish Oil 1000 Mg Caps (Omega-3 Fatty Acids) .... 2 Caps Daily 13)  Amlodipine Besylate 2.5 Mg Tabs (Amlodipine Besylate) .... One Tab By Mouth Once Daily  Allergies (verified): No Known Drug Allergies  Past History:  Past Medical History:    Reviewed history from 11/18/2008 and no changes required:    Colonic polyps, hx of    Diabetes mellitus, type II    GERD    Hyperlipidemia    Hypertension    Coronary artery disease s/p CABG    Peripheral vascular disease (PTA RLE 12/08 [place])    diabetic neuropathy    plantar fasciitis    FOOT PAIN, RIGHT - chronic (ICD-729.5)    ALCOHOL  ABUSE, EPISODIC (ICD-305.02)           Past Surgical History:    Reviewed history from 10/30/2008 and no changes required:    Coronary artery bypass graft(2000-SVG to diagonal, LIMA to LAD, SVG to LCX)    PAD surgery    knee replacement  Review of Systems       no nausea / vomiting / diarrhea / fever / chills / chest pain / SOB   Physical Exam  Additional Exam:  General:     Well-developed, well nourished, well hydrated, in no acute distress; appropriate and cooperative   Head:     Normocephalic and atraumatic without obvious abnormalities.  Eyes:     No corneal or conjunctival inflammation. Vision grossly normal. Ears:     External ear shows no significant lesions or deformities.  Hearing is grossly normal. Nose:     External nasal examination shows no deformity or inflammation.  Neck:     supple, full ROM Lungs:     Normal respiratory effort, chest expands  symmetrically with good air flow noted. Lungs clear to auscultation with no crackles, rales or wheezes.   Heart:     Normal rate and  rhythm. S1 and S2 normal, without gallop, murmur, click, rub  Extremities:     No cyanosis, edema, or deformity noted, grossly normal full range of motion with all joints.  Neurologic:     alert & oriented X3,  gait normal.  no deficit in strength noted, no balance problems noted, neuro is grossly intact Skin:     Intact without suspicious lesions or rashes Psych:     Cognition and judgment appear intact. Alert and cooperative with normal attention span and concentration. No apparent delusions, illusions, hallucinations, normally interactive, good eye contact, not anxious or depressed appearing, and not agitated.      Impression & Recommendations:  Problem # 1:  HYPERTENSION (ICD-401.9) doing better.  increase norvasc to 5mg  and follow up in 3 months.  get labs. His updated medication list for this problem includes:    Lisinopril 40 Mg Tabs (Lisinopril) ..... One tab by mouth qd    Metoprolol Tartrate 100 Mg Tabs (Metoprolol tartrate) ..... One tablet twice daily    Amlodipine Besylate 5 Mg Tabs (Amlodipine besylate) ..... One by mouth once daily  Orders: TLB-Lipid Panel (80061-LIPID) TLB-CMP (Comprehensive Metabolic Pnl) (80053-COMP)  Problem # 2:  DIABETES MELLITUS, TYPE II (ICD-250.00) doing better.  get labs.  see HPI for discussion about patient's resistance to insulin use.  follow up in 3months His updated medication list for this problem includes:    Lisinopril 40 Mg Tabs (Lisinopril) ..... One tab by mouth qd    Bayer Aspirin 325 Mg Tabs (Aspirin) .Marland Kitchen... Take 1 tablet by mouth once a day    Januvia 100 Mg Tabs (Sitagliptin phosphate) ..... One tab by mouth once daily    Metformin Hcl 1000 Mg Tabs (Metformin hcl) ..... One tab by mouth twice daily    Actos 45 Mg Tabs (Pioglitazone hcl) .Marland Kitchen... Take 1 tablet by mouth once a day    Glimepiride 4 Mg  Tabs (Glimepiride) .Marland Kitchen... 2 tabs by mouth once daily  Orders: TLB-Lipid Panel (80061-LIPID) TLB-CMP (Comprehensive Metabolic Pnl) (80053-COMP) TLB-A1C / Hgb A1C (Glycohemoglobin) (83036-A1C) TLB-Microalbumin/Creat Ratio, Urine (82043-MALB)  Problem # 3:  CORONARY ARTERY DISEASE (ICD-414.00) keep follow up with cardiology and continue current care His updated medication list for this problem includes:    Lisinopril 40 Mg Tabs (Lisinopril) .Marland KitchenMarland KitchenMarland KitchenMarland Kitchen  One tab by mouth qd    Metoprolol Tartrate 100 Mg Tabs (Metoprolol tartrate) ..... One tablet twice daily    Bayer Aspirin 325 Mg Tabs (Aspirin) .Marland Kitchen... Take 1 tablet by mouth once a day    Nitrostat 0.4 Mg Subl (Nitroglycerin) .Marland Kitchen... 1 tab every 5 minutes as needed    Amlodipine Besylate 5 Mg Tabs (Amlodipine besylate) ..... One by mouth once daily  Problem # 4:  FOOT PAIN, RIGHT (ICD-729.5) continue with podiatry care  Problem # 5:  GERD (ICD-530.81) continue with current management  Problem # 6:  HYPERLIPIDEMIA (ICD-272.4) continue with meds.  check labs His updated medication list for this problem includes:    Lipitor 40 Mg Tabs (Atorvastatin calcium) .Marland Kitchen... Take 1 tablet by mouth once a day  Orders: TLB-Lipid Panel (80061-LIPID) TLB-CMP (Comprehensive Metabolic Pnl) (80053-COMP)  Problem # 7:  ENCOUNTER FOR LONG-TERM USE OF OTHER MEDICATIONS (ICD-V58.69)  Orders: TLB-Lipid Panel (80061-LIPID) TLB-CMP (Comprehensive Metabolic Pnl) (80053-COMP)  Complete Medication List: 1)  Lipitor 40 Mg Tabs (Atorvastatin calcium) .... Take 1 tablet by mouth once a day 2)  Lisinopril 40 Mg Tabs (Lisinopril) .... One tab by mouth qd 3)  Metoprolol Tartrate 100 Mg Tabs (Metoprolol tartrate) .... One tablet twice daily 4)  Bayer Aspirin 325 Mg Tabs (Aspirin) .... Take 1 tablet by mouth once a day 5)  Nitrostat 0.4 Mg Subl (Nitroglycerin) .Marland Kitchen.. 1 tab every 5 minutes as needed 6)  Januvia 100 Mg Tabs (Sitagliptin phosphate) .... One tab by mouth once  daily 7)  Metformin Hcl 1000 Mg Tabs (Metformin hcl) .... One tab by mouth twice daily 8)  Actos 45 Mg Tabs (Pioglitazone hcl) .... Take 1 tablet by mouth once a day 9)  Glimepiride 4 Mg Tabs (Glimepiride) .... 2 tabs by mouth once daily 10)  Meloxicam 7.5 Mg Tabs (Meloxicam) .... Take 1 tablet by mouth once a day 11)  Centrum Silver Tabs (Multiple vitamins-minerals) .... Take 1 tablet by mouth once a day 12)  Eql Fish Oil 1000 Mg Caps (Omega-3 fatty acids) .... 2 caps daily 13)  Amlodipine Besylate 5 Mg Tabs (Amlodipine besylate) .... One by mouth once daily  Patient Instructions: 1)  Please schedule a follow-up appointment in 3 months .  Prescriptions: GLIMEPIRIDE 4 MG TABS (GLIMEPIRIDE) 2 tabs by mouth once daily  #180 x 1   Entered and Authorized by:   Paulo Fruit MD   Signed by:   Paulo Fruit MD on 02/19/2009   Method used:   Electronically to        [facility] [address] (retail)       [phone]       [place], [place]  [address]       Ph: [phone]       Fax: [phone]   RxID:   [id number] METOPROLOL TARTRATE 100 MG TABS (METOPROLOL TARTRATE) one tablet twice daily  #180 x 1   Entered and Authorized by:   Paulo Fruit MD   Signed by:   Paulo Fruit MD on 02/19/2009   Method used:   Electronically to        [facility] [address] (retail)       [phone]       [place], [place]  [address]       Ph: [phone]       Fax: [phone]   RxID:  [id number] METFORMIN HCL 1000 MG TABS (METFORMIN HCL) one tab by mouth twice daily  #180 x 1   Entered and Authorized by:   Paulo Fruit MD   Signed by:   Paulo Fruit MD on 02/19/2009   Method used:   Electronically to        [facility] [address] (retail)       [phone]       [place], [place]  [address]       Ph: [phone]       Fax: [phone]   RxID:    [id number] LISINOPRIL 40 MG TABS (LISINOPRIL) one tab by mouth qd  #30 x 3   Entered and Authorized by:   Paulo Fruit MD   Signed by:   Paulo Fruit MD on 02/19/2009   Method used:   Electronically to        [facility] [address] (retail)       [phone]       [place], [place]  [address]       Ph: [phone]       Fax: [phone]   RxID:   [id number] AMLODIPINE BESYLATE 5 MG TABS (AMLODIPINE BESYLATE) one by mouth once daily  #30 x 3   Entered and Authorized by:   Paulo Fruit MD   Signed by:   Paulo Fruit MD on 02/19/2009   Method used:   Electronically to        [facility] [address] (retail)       [phone]       [place], [place]  [address]       Ph: [phone]       Fax: [phone]   RxID:   [id number]

## 2010-12-18 NOTE — Medication Information (Signed)
Summary: [employer]  [employer]   Imported By: Lanelle Bal 09/18/2008 15:52:56  _____________________________________________________________________  External Attachment:    Type:   Image     Comment:   External Document

## 2011-01-07 ENCOUNTER — Encounter: Payer: Self-pay | Admitting: Internal Medicine

## 2011-01-07 ENCOUNTER — Ambulatory Visit (INDEPENDENT_AMBULATORY_CARE_PROVIDER_SITE_OTHER): Payer: Federal, State, Local not specified - PPO | Admitting: Internal Medicine

## 2011-01-07 DIAGNOSIS — E785 Hyperlipidemia, unspecified: Secondary | ICD-10-CM

## 2011-01-07 DIAGNOSIS — I10 Essential (primary) hypertension: Secondary | ICD-10-CM

## 2011-01-07 DIAGNOSIS — E119 Type 2 diabetes mellitus without complications: Secondary | ICD-10-CM

## 2011-01-07 LAB — CONVERTED CEMR LAB
BUN: 20 mg/dL (ref 6–23)
CO2: 28 meq/L (ref 19–32)
Calcium: 10.1 mg/dL (ref 8.4–10.5)
Chloride: 103 meq/L (ref 96–112)
Cholesterol: 162 mg/dL (ref 0–200)
Creatinine, Ser: 0.74 mg/dL (ref 0.40–1.50)
Creatinine, Urine: 22.8 mg/dL
Glucose, Bld: 155 mg/dL — ABNORMAL HIGH (ref 70–99)
HDL: 33 mg/dL — ABNORMAL LOW (ref >39)
Hgb A1c MFr Bld: 7.3 % — ABNORMAL HIGH (ref <5.7)
LDL Cholesterol: 113 mg/dL — ABNORMAL HIGH (ref 0–99)
Microalb Creat Ratio: 923.2 mg/g — ABNORMAL HIGH (ref 0.0–30.0)
Microalb, Ur: 21.05 mg/dL — ABNORMAL HIGH (ref 0.00–1.89)
Potassium: 4.9 meq/L (ref 3.5–5.3)
Sodium: 142 meq/L (ref 135–145)
Total CHOL/HDL Ratio: 4.9
Triglycerides: 81 mg/dL (ref <150)
VLDL: 16 mg/dL (ref 0–40)

## 2011-01-07 LAB — HM DIABETES FOOT EXAM

## 2011-01-11 ENCOUNTER — Ambulatory Visit: Payer: Self-pay | Admitting: Internal Medicine

## 2011-01-11 ENCOUNTER — Encounter: Payer: Self-pay | Admitting: Internal Medicine

## 2011-01-21 NOTE — Letter (Signed)
   [place] [facility] [address] [place], [place]  [address]  [place] Phone: [phone]      January 11, 2011   Gene [name] [address] [facility] [address] [place], [place] [address]  RE:  LAB RESULTS  Dear  [name],  The following is an interpretation of your most recent lab tests.  Please take note of any instructions provided or changes to medications that have resulted from your lab work.  ELECTROLYTES:  Good - no changes needed  KIDNEY FUNCTION TESTS:  Good - no changes needed  LIPID PANEL:  Fair - review at your next visit Triglyceride: 81   Cholesterol: 162   LDL: 113   HDL: 33   Chol/HDL%:  4.9 Ratio   DIABETIC STUDIES:  Fair - schedule a follow-up appointment Blood Glucose: 155   HgbA1C: 7.3   Microalbumin/Creatinine Ratio: 923.2          Sincerely Yours,    Dr. Thomos Lemons  Appended Document:  mailed

## 2011-01-26 NOTE — Assessment & Plan Note (Signed)
Summary: med refills/hea   Vital Signs:  Patient profile:   55 year old male Height:      72 inches Weight:      208.75 pounds BMI:     28.41 O2 Sat:      98 % on Room air Temp:     98.6 degrees F oral Pulse rate:   103 / minute Resp:     20 per minute BP sitting:   166 / 102  (right arm) Cuff size:   large  Vitals Entered By: Glendell Docker CMA (January 07, 2011 2:58 PM)  O2 Flow:  Room air CC: follow-up visit, Type 2 diabetes mellitus follow-up Is Patient Diabetic? Yes Pain Assessment Patient in pain? no      Comments low blood sugar 83, high  115, medications rand out today-layed off yesterday from work, Simvastatin taken about 3 months ago, pen needles will not be covered by insurance, requesting generic   Primary Care Provider:  D. Thomos Lemons DO  CC:  follow-up visit and Type 2 diabetes mellitus follow-up.  History of Present Illness:  Hypertension Follow-Up      This is a 55 year old man who presents for Hypertension follow-up.  The patient denies lightheadedness and headaches.  The patient denies the following associated symptoms: chest pain.  out of bp meds x 3-4 days   Type 2 Diabetes Mellitus Follow-Up      The patient is also here for Type 2 diabetes mellitus follow-up.  The patient denies self managed hypoglycemia, hypoglycemia requiring help, and weight gain.  Since the last visit the patient reports good dietary compliance and compliance with medications.    hyperlipidemia -  current statin cost prohibitive.  he hasn't taken for about 3 months  Preventive Screening-Counseling & Management  Alcohol-Tobacco     Smoking Status: current  Allergies (verified): No Known Drug Allergies  Past History:  Past Medical History: Colonic polyps, hx of Diabetes mellitus, type II GERD   Hyperlipidemia     Hypertension  Coronary artery disease s/p CABG Peripheral vascular disease (PTA RLE 12/08 [place]) diabetic neuropathy plantar fasciitis FOOT PAIN,  RIGHT - chronic (ICD-729.5) ALCOHOL ABUSE, EPISODIC (ICD-305.02)     Past Surgical History: Coronary artery bypass graft(2000-SVG to diagonal, LIMA to LAD, SVG to LCX) PAD surgery knee replacement         Family History: Family History High cholesterol Family History Hypertension Family History of Stroke Mot her with CAD         Social History: Occupation: Biomedical scientist,  unemployed Second marriage - 3 children from first marriage Tobacco Use - Yes.   Alcohol Use - yes(rare)        Physical Exam  General:  alert, well-developed, and well-nourished.   Lungs:  normal respiratory effort and normal breath sounds.   Heart:  normal rate, regular rhythm, and no gallop.   Extremities:  No lower extremity edema   Diabetes Management Exam:    Foot Exam (with socks and/or shoes not present):       Inspection:          Left foot: normal          Right foot: normal   Impression & Recommendations:  Problem # 1:  HYPERTENSION (ICD-401.9) stressed compliance  His updated medication list for this problem includes:    Lisinopril 40 Mg Tabs (Lisinopril) ..... One tab by mouth qd    Metoprolol Tartrate 100 Mg Tabs (Metoprolol tartrate) ..... One  tablet twice daily    Amlodipine Besylate 10 Mg Tabs (Amlodipine besylate) ..... One by mouth qday  BP today: 166/102 Prior BP: 130/80 (04/01/2010)  Labs Reviewed: K+: 4.9 (03/25/2010) Creat: : 0.77 (03/25/2010)   Chol: 104 (02/19/2009)   HDL: 29.90 (02/19/2009)   LDL: 59 (02/19/2009)   TG: 77.0 (02/19/2009)  Problem # 2:  DIABETES MELLITUS, TYPE II (ICD-250.00) Assessment: Unchanged  His updated medication list for this problem includes:    Lisinopril 40 Mg Tabs (Lisinopril) ..... One tab by mouth qd    Bayer Aspirin 325 Mg Tabs (Aspirin) .Marland Kitchen... Take 1 tablet by mouth once a day    Metformin Hcl 1000 Mg Tabs (Metformin hcl) ..... One tab by mouth twice daily    Glimepiride 4 Mg Tabs (Glimepiride) .Marland Kitchen... 1 tab by mouth once  daily    Lantus Solostar 100 Unit/ml Soln (Insulin glargine) .Marland Kitchen... 20 -30 units once daily subcutaneously  Labs Reviewed: Creat: 0.77 (03/25/2010)     Last Eye Exam: normal (06/23/2009) Reviewed HgBA1c results: 7.8 (03/25/2010)  7.5 (09/17/2009)  Problem # 3:  HYPERLIPIDEMIA (ICD-272.4) Assessment: Deteriorated switch to simvatatin due cost reasons I encouraged compliance  His updated medication list for this problem includes:    Simvastatin 20 Mg Tabs (Simvastatin) ..... One by mouth once daily  Labs Reviewed: SGOT: 24 (02/19/2009)   SGPT: 21 (02/19/2009)   HDL:29.90 (02/19/2009), 27.6 (09/04/2008)  LDL:59 (02/19/2009), 58 (09/04/2008)  Chol:104 (02/19/2009), 95 (09/04/2008)  Trig:77.0 (02/19/2009), 48 (09/04/2008)  Complete Medication List: 1)  Lisinopril 40 Mg Tabs (Lisinopril) .... One tab by mouth qd 2)  Metoprolol Tartrate 100 Mg Tabs (Metoprolol tartrate) .... One tablet twice daily 3)  Bayer Aspirin 325 Mg Tabs (Aspirin) .... Take 1 tablet by mouth once a day 4)  Nitrostat 0.4 Mg Subl (Nitroglycerin) .Marland Kitchen.. 1 tab every 5 minutes as needed 5)  Metformin Hcl 1000 Mg Tabs (Metformin hcl) .... One tab by mouth twice daily 6)  Glimepiride 4 Mg Tabs (Glimepiride) .Marland Kitchen.. 1 tab by mouth once daily 7)  Centrum Silver Tabs (Multiple vitamins-minerals) .... Take 1 tablet by mouth once a day 8)  Eql Fish Oil 1000 Mg Caps (Omega-3 fatty acids) .... 2 caps daily 9)  Amlodipine Besylate 10 Mg Tabs (Amlodipine besylate) .... One by mouth qday 10)  Lantus Solostar 100 Unit/ml Soln (Insulin glargine) .... 20 -30 units once daily subcutaneously 11)  Pen Needles 5/16" 31g X 8 Mm Misc (Insulin pen needle) .... Use once daily as directed 12)  Bayer Breeze 2 Test Disk (Glucose blood) .... Use three times a day to test blood sugar 13)  Simvastatin 20 Mg Tabs (Simvastatin) .... One by mouth once daily  Patient Instructions: 1)  Please schedule a follow-up appointment in 2  months. Prescriptions: AMLODIPINE BESYLATE 10 MG TABS (AMLODIPINE BESYLATE) one by mouth qday  #90 x 1   Entered and Authorized by:   D. Thomos Lemons DO   Signed by:   D. Thomos Lemons DO on 01/07/2011   Method used:   Print then Give to Patient   RxID:   [id number] METFORMIN HCL 1000 MG TABS (METFORMIN HCL) one tab by mouth twice daily  #180 x 1   Entered and Authorized by:   D. Thomos Lemons DO   Signed by:   D. Thomos Lemons DO on 01/07/2011   Method used:   Print then Give to Patient   RxID:   [id number] METOPROLOL TARTRATE 100 MG TABS (METOPROLOL TARTRATE) one tablet twice daily  #  180 x 1   Entered and Authorized by:   D. Thomos Lemons DO   Signed by:   D. Thomos Lemons DO on 01/07/2011   Method used:   Print then Give to Patient   RxID:   [id number] LISINOPRIL 40 MG TABS (LISINOPRIL) one tab by mouth qd  #90 x 1   Entered and Authorized by:   D. Thomos Lemons DO   Signed by:   D. Thomos Lemons DO on 01/07/2011   Method used:   Print then Give to Patient   RxID:   [id number] PEN NEEDLES 5/16" 31G X 8 MM MISC (INSULIN PEN NEEDLE) use once daily as directed  #120 x 1   Entered and Authorized by:   D. Thomos Lemons DO   Signed by:   D. Thomos Lemons DO on 01/07/2011   Method used:   Print then Give to Patient   RxID:   [id number] SIMVASTATIN 20 MG TABS (SIMVASTATIN) one by mouth once daily  #90 x 1   Entered and Authorized by:   D. Thomos Lemons DO   Signed by:   D. Thomos Lemons DO on 01/07/2011   Method used:   Print then Give to Patient   RxID:   [id number]    Orders Added: 1)  Est. Patient Level III [42595]   Immunization History:  Influenza Immunization History:    Influenza:  historical (10/26/2010)   Immunization History:  Influenza Immunization History:    Influenza:  Historical (10/26/2010)  Current Allergies (reviewed today): No known allergies

## 2011-02-15 ENCOUNTER — Telehealth: Payer: Self-pay | Admitting: Internal Medicine

## 2011-02-15 DIAGNOSIS — E119 Type 2 diabetes mellitus without complications: Secondary | ICD-10-CM

## 2011-02-15 MED ORDER — INSULIN GLARGINE 100 UNIT/ML ~~LOC~~ SOLN: 15.0000 [IU] | Freq: Every day | SUBCUTANEOUS | Status: DC

## 2011-02-15 NOTE — Telephone Encounter (Signed)
Refill- lantus solostar inj. Inject fifteen to thirty units subcutaneously once daily. Qty 15. Last fill 1.12.12

## 2011-02-15 NOTE — Telephone Encounter (Signed)
rx sent to pharmacy

## 2011-02-16 ENCOUNTER — Encounter: Payer: Self-pay | Admitting: Internal Medicine

## 2011-02-17 ENCOUNTER — Encounter: Payer: Self-pay | Admitting: Internal Medicine

## 2011-02-17 ENCOUNTER — Ambulatory Visit (INDEPENDENT_AMBULATORY_CARE_PROVIDER_SITE_OTHER): Payer: Federal, State, Local not specified - PPO | Admitting: Internal Medicine

## 2011-02-17 DIAGNOSIS — E119 Type 2 diabetes mellitus without complications: Secondary | ICD-10-CM

## 2011-02-17 DIAGNOSIS — I10 Essential (primary) hypertension: Secondary | ICD-10-CM

## 2011-02-17 MED ORDER — LABETALOL HCL 200 MG PO TABS: 200.0000 mg | ORAL_TABLET | Freq: Two times a day (BID) | ORAL | Status: DC

## 2011-02-17 NOTE — Assessment & Plan Note (Addendum)
Reasonably controlled. I encouraged daily exercise.  No change in medications Lab Results  Component Value Date   HGBA1C 7.3* 01/07/2011

## 2011-02-17 NOTE — Assessment & Plan Note (Signed)
BP still not at goal Change metoprolol to labetalol BP Readings from Last 3 Encounters:  02/17/11 158/80  01/07/11 166/102  04/01/10 130/80

## 2011-02-17 NOTE — Patient Instructions (Signed)
Please bring your medication bottles to your next follow up appointment. Complete blood work 2-3 days before your next appointment BMET,  A1C - 250.02

## 2011-02-17 NOTE — Progress Notes (Signed)
Subjective:    Patient ID: Gene [name], male    DOB: 1956-11-15, 55 y.o.   MRN: [id number]  HPI  55 y/o male with uncontrolled htn, DM II for follow up  Htn - reports taking all meds.  He took 30 mins ago.   No side effects.  He does not check BP at home.  DM II - blood sugars avg 98-low 100.  Lowest reading 82  Still unemployed.  Tried to work at [employer]. 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      Review of Systems  Constitutional: Negative for activity change.  Respiratory: Negative for shortness of breath.   Cardiovascular: Negative for chest pain.    Past Medical History  Diagnosis Date  . History of colonic polyps   . Diabetes mellitus type II   . GERD (gastroesophageal reflux disease)   . Hyperlipidemia   . Hypertension   . CAD (coronary artery disease)   . PVD (peripheral vascular disease)     PTA RLE 12/08 [place]  . Diabetic neuropathy   . Plantar fasciitis   . Chronic pain in right foot   . Alcohol abuse, episodic     History   Social History  . Marital Status: Married    Spouse Name: N/A    Number of Children: N/A  . Years of Education: N/A   Occupational History  . Not on file.   Social History Main Topics  . Smoking status: Current Some Day Smoker  . Smokeless tobacco: Not on file  . Alcohol Use: Not on file  . Drug Use: Yes     rare  . Sexually Active: Not on file   Other Topics Concern  . 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  Alcohol Use - yes(rare)         Past Surgical History  Procedure Date  . Coronary artery bypass graft     200 SVG to diagonal, LIMA to LAD, SVG to LCX  . Other surgical history     PAD surgery  . Revision total knee arthroplasty     Family History  Problem Relation Age of Onset  . Hyperlipidemia    . Hypertension    . Stroke    . Coronary artery disease Mother     No Known Allergies  Current Outpatient  Prescriptions on File Prior to Visit  Medication Sig Dispense Refill  . amLODipine (NORVASC) 10 MG tablet Take 10 mg by mouth daily.        Marland Kitchen aspirin 81 MG tablet Take 81 mg by mouth daily.        . Blood Glucose Monitoring Suppl (BAYER BREEZE 2 SYSTEM) W/DEVICE KIT by Does not apply route. Use to check blood sugar three times daily       . glimepiride (AMARYL) 4 MG tablet Take 4 mg by mouth daily before breakfast.        . insulin glargine (LANTUS SOLOSTAR) 100 UNIT/ML injection Inject 15-30 Units into the skin daily.  15 mL  3  . lisinopril (PRINIVIL,ZESTRIL) 40 MG tablet Take 40 mg by mouth daily.        . metFORMIN (GLUCOPHAGE) 1000 MG tablet Take 1,000 mg by mouth 2 (two) times daily with a meal.        . metoprolol (LOPRESSOR) 100 MG tablet Take 100 mg by mouth 2 (two) times daily.        Marland Kitchen  Multiple Vitamins-Minerals (CENTRUM PO) Take by mouth daily.        . nitroGLYCERIN (NITROSTAT) 0.4 MG SL tablet Place 0.4 mg under the tongue every 5 (five) minutes as needed.        . simvastatin (ZOCOR) 20 MG tablet Take 20 mg by mouth at bedtime.          BP 142/80  Pulse 82  Temp(Src) 98.4 F (36.9 C) (Oral)  Resp 18  Ht 6' (1.829 m)  Wt 212 lb (96.163 kg)  BMI 28.75 kg/m2  SpO2 99%       Objective:   Physical Exam  Constitutional: He appears well-developed and well-nourished.  Cardiovascular: Normal rate and normal heart sounds.   Pulmonary/Chest: Breath sounds normal.  Musculoskeletal: He exhibits no edema.  Psychiatric: He has a normal mood and affect. His behavior is normal.          Assessment & Plan:

## 2011-03-25 ENCOUNTER — Other Ambulatory Visit: Payer: Self-pay | Admitting: Internal Medicine

## 2011-03-25 DIAGNOSIS — E119 Type 2 diabetes mellitus without complications: Secondary | ICD-10-CM

## 2011-03-25 NOTE — Telephone Encounter (Signed)
BREEZE 2 TEST DISK DIS QTY 100 USE 3 TIMES DAILY TO TEST BLOOD SUGAR LAST FILL 12-26-09

## 2011-03-25 NOTE — Telephone Encounter (Signed)
Pt is requesting 90 day supply. Has f/u 04/23/11. How many refills can we give?

## 2011-03-26 ENCOUNTER — Telehealth: Payer: Self-pay | Admitting: Internal Medicine

## 2011-03-26 MED ORDER — GLUCOSE BLOOD VI DISK: 1.0000 | DISK | Freq: Three times a day (TID) | Status: DC

## 2011-03-26 NOTE — Telephone Encounter (Signed)
Refill glimepiride 4 mg tab qty 180 take 2 by mouth daily last fill 09-26-2010 Refill breeze 2 test disk dis qty 100 use three times daily to test blood sugar last fill 12-26-2009

## 2011-03-26 NOTE — Telephone Encounter (Signed)
Call placed to patient at [phone], no answer  A detailed voice message was left for patient to return phone call regarding glimepiride to verify how he is taking medication

## 2011-03-26 NOTE — Telephone Encounter (Signed)
Rx refill sent to pharmacy. 

## 2011-03-26 NOTE — Telephone Encounter (Signed)
Patient return phone call and stated that he is taking the glimepiride once daily, as it was changed at his last office visit. Rx refill sent to pharmacy.

## 2011-03-30 NOTE — Cardiovascular Report (Signed)
NAMECRAVEN, CREAN NO.:  [id number]   MEDICAL RECORD NO.:  [id number]          PATIENT TYPE:  INP   LOCATION:  2623                         FACILITY:  [facility]   PHYSICIAN:  Bruce R. Juanda Chance, MD, FACCDATE OF BIRTH:  29-Mar-1956   DATE OF PROCEDURE:  07/31/2008  DATE OF DISCHARGE:  08/01/2008                            CARDIAC CATHETERIZATION   CLINICAL HISTORY:  Mr. Alberta is a 55 year old gentleman with diabetes,  hyperlipidemia, and prior bypass surgery in 2000.  He was admitted to  the hospital with acute alcoholic gastritis and had positive enzymes  consistent with non-ST-elevation myocardial infarction.  After improved  from his gastritis, he underwent catheterization.  He was scheduled for  evaluation with catheterization.   PROCEDURE:  The procedure was performed via the right femoral arterial  sheath and 5-French preformed coronary catheters.  The front-wall  arterial puncture was performed and Omnipaque contrast was used.  The  patient tolerated the procedure well and left the laboratory in  satisfactory condition.   RESULTS:  Left main coronary artery:  The left main coronary artery was  free of any significant disease.   Left anterior descending artery:  The left anterior descending artery  completely occluded after a diagonal branch and 2 septal perforators.  There was tandem 80% stenosis in the small diagonal branch.   Circumflex artery:  The circumflex artery gave rise to a small marginal  branch and 2 posterolateral branches.  There was 80% narrowing in the  mid distal circumflex artery and there were 90-95% stenoses in each of  the 2 posterolateral branches.   Right coronary artery:  The right coronary artery is a moderate-sized  vessel that gave rise to a conus branch, a right posterior descending  branch, and 2 posterolateral branches.  There is 40% narrowing in the  proximal vessel.  There were 80% and 90% stenoses in the subbranches of  the posterior descending branch.  There were 90% and 80% stenoses in the  first posterolateral branch.   The saphenous vein graft to the diagonal branch of the LAD was patent  and functioned normally.   The LIMA graft to the LAD was patent and functioned normally.   The graft at the circumflex artery was completely occluded at its  origin.   Left ventriculogram:  The left ventriculogram performed in the RAO  projection showed good wall motion with no areas of hypokinesis.  The  estimated ejection fraction was 60%.   Distal aortogram:  A distal aortogram was performed which showed patent  renal arteries and no significant aortoiliac obstruction.   IMPRESSION:  1. Coronary artery disease status post coronary artery bypass graft      surgery in 2000.  2. Severe native vessel disease with total occlusion of the proximal      left anterior descending and 80% stenosis in the first diagonal      branch, 80% stenosis in the distal circumflex artery with 90-95%      stenoses in the first and second posterolateral branches of the      circumflex artery, 40% narrowing in the right  coronary with 80% and      90% stenosis in the posterior descending branch and 90% and 80%      stenoses in the first posterolateral branch.  3. Patent vein graft to diagonal branch of the left anterior      descending , patent left internal mammary artery graft to the left      anterior descending, occluded vein graft to the circumflex artery.  4. Normal left ventricular function with estimated ejection fraction      of 60%.  5. No significant aortoiliac disease and no significant renal artery      stenosis.   RECOMMENDATIONS:  The patient has what appears to be an old occlusion of  the vein graft to the circumflex artery.  He has small vessel disease  and a distal circumflex artery and distal right coronary.  We will plan  medical therapy.      Bruce Elvera Lennox Juanda Chance, MD, [facility]  Electronically Signed      BRB/MEDQ  D:  10/03/2008  T:  10/03/2008  Job:  [id number]   cc:   Marca Ancona, MD

## 2011-03-30 NOTE — H&P (Signed)
Gene [name], Gene [name] NO.:  [id number]   MEDICAL RECORD NO.:  [id number]          PATIENT TYPE:  INP   LOCATION:  2306                         FACILITY:  [facility]   PHYSICIAN:  Vania Rea, M.D. DATE OF BIRTH:  04/29/1956   DATE OF ADMISSION:  07/29/2008  DATE OF DISCHARGE:                              HISTORY & PHYSICAL   PRIMARY CARE PHYSICIAN:  Unassigned.   CHIEF COMPLAINT:  Persistent vomiting.   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  The patient has actually been seeking a new job and recently  obtained that new job and although he is not a drinker, he went out to  celebrate 2 days ago and drank 2 bottles of wine.  He woke up the  following morning with persistent vomiting, says he vomited about every  5 minutes all of Saturday but Sunday woke up feeling much better, went  to work, had a Pepsi to drink and everything started again with the  vomiting.  He said he vomited up dark material which he thinks was a  Pepsi and there also some bile mixed with it.  He previously the  Saturday he did vomit a streak of blood which he felt was because he had  been vomiting and retching so much the whole day, he may have torn  something.  On Saturday the patient was also having diarrhea, which had  cleared by Sunday.  The stool was brown and at no time was there any  blood in it and at no time was it black.  Because of the persistent  vomiting and feeling sick, the patient visited the [facility] emergency  room where he diagnosed with upper GI bleed and the hospitalist service  called to assist with management.   PAST MEDICAL HISTORY:  1.  Diabetes type 2.  2. Hyperlipidemia.  3. Coronary artery disease, status post CABG in 2000.  4. Peripheral vascular disease, status post stent placement.  5. History of right knee replacement.   MEDICATIONS:  1. Actos 45 mg daily.  2. Lipitor 20 mg daily.   The patient has been noncompliant with Janumet, Amaryl, aspirin and Azor  since June.   ALLERGIES:  No known drug allergies.   SOCIAL HISTORY:  He quit smoking 1 pack per day about 4 weeks ago.  Does  not usually drink alcohol, maybe drinks 1 beer per month but 2 days ago  drank 2 bottles of wine, which precipitated these events.  Denies  illicit drug use.  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   REVIEW OF SYSTEMS:  Other than noted above, a 10-point review of systems  is unremarkable.   PHYSICAL EXAMINATION:  Middle-aged Caucasian gentleman reclining in bed,  looks very  uncomfortable.  VITAL SIGNS:  Temperature 97.9, pulse 118, respirations 18, blood  pressure 188/108.  He is saturating 100% on 2 L.  His pupils are round and equal.  Mucous membranes pink and anicteric.  He is dry.  He has no cervical lymphadenopathy.  No thyromegaly.  No jugular venous  distention.  CHEST:  Clear to auscultation bilaterally.  CARDIOVASCULAR:  Distant, tachycardiac.  No murmur.  ABDOMEN:  Soft.  There is no tenderness.  EXTREMITIES:  Without edema.  He has 2+ pulses bilaterally.  He has a  scar on his left leg status post vein graft harvesting and scar of his  right knee status post right knee replacement.  CENTRAL NERVOUS SYSTEM:  Cranial nerves II-XII are grossly intact.  He  has no focal neurologic deficit.   LABORATORY DATA:  His ABG, his pH is 7.27, pCO2 27, his CO2 is 114,  saturating 100% on 2 L.  His white count is 15.5, hemoglobin 15.1, MCV  89, platelets 263.  His sodium is 143, potassium 4.5, chloride 99, CO2  15, BUN 22, creatinine 0.8, glucose 283.  His albumin is elevated at  5.4, total  protein 8.5.  His liver function tests are otherwise  unremarkable.  Urinalysis:  Specific gravity was 1.027, greater than  1000 glucose, over 100 protein, nitrites and leukocyte esterase  negative.  His blood was positive for acetone.  His EKG shows sinus  tachycardia and nonspecific T-wave changes.  His cardiac enzymes:  His  myoglobin was 8.1, rising to 103.  CK-MB was 14.1, falling to 7.9.  Troponin was 0.45, falling to 0.2 over a period of 2 hours.  His EKG  shows sinus tachycardia with nonspecific T-wave changes.   ASSESSMENT:  1. Acute alcoholic gastritis.  2. Diabetes type 2, uncontrolled.  3. Metabolic acidosis.  4. Non-ST elevation myocardial infarction.  5. It is actually unclear whether this gentleman had an acute      myocardial infarction which has precipitated diabetic ketoacidosis      or whether he has a severe alcoholic gastritis and alcoholic      ketosis which has precipitated an acute myocardial infarction.  6. He also has uncontrolled hypertension.  7. History of hyperlipidemia.   PLAN:  We will hydrate this gentleman vigorously and use a Glucommander  to control his blood sugar and then control his acidosis.  We will check  serial hemoglobins, although he does not have any clear evidence of  active GI bleeding.  We will consult cardiology for management of his  non-ST elevation MI and we will give intravenous beta blockers for his  hypertension.  Other plans as per orders.   cc time: 75 mins.        Vania Rea, M.D.  Electronically Signed     LC/MEDQ  D:  07/29/2008  T:  07/29/2008  Job:  [id number]

## 2011-03-30 NOTE — Consult Note (Signed)
NAMEGIAN, YBARRA NO.:  [id number]   MEDICAL RECORD NO.:  [id number]          PATIENT TYPE:  EMS   LOCATION:  ED                            FACILITY:  [facility]   PHYSICIAN:  Marca Ancona, MD      DATE OF BIRTH:  06-06-1956   DATE OF CONSULTATION:  DATE OF DISCHARGE:  07/29/2008                                 CONSULTATION   PRIMARY CARE PHYSICIAN:  Wayland Salinas, MD   HISTORY OF PRESENT ILLNESS:  This is a 55 year old with history of  coronary artery disease, status post coronary artery bypass grafting,  who was admitted with nausea/vomiting after an alcohol binge.  He is  dehydrated and has an elevated gap metabolic acidosis.  The patient  celebrated getting a new job on Friday night with two bottles of wine.  He woke up feeling sick on Saturday morning.  He had nausea and vomiting  all day, also had diarrhea later in the day.  Of note, the patient  states that he does rarely drink alcohol.  The patient went to the  emergency department at [facility], Saturday evening, where he was given  IV fluids and antiemetics and sent home.  At the time, the patient  denied having any chest pain or any other cardiac symptoms, but the  cardiac enzymes were checked and troponin was noted to be elevated at  0.45.  The patient was sent home.  He resumed vomiting on Sunday  afternoon with nausea as well.  He is not able to hold down any food.  He did have some flecks of blood in the emesis.  He returned to the  emergency department.  An elevated gap metabolic acidosis was noted at  that time with ketones in the blood as well as dehydration.  Troponin at  this time was 0.20.  The patient denies any chest pain, shortness of  breath, or any other cardiopulmonary symptoms.  The patient has actually  had no chest pain since his coronary artery bypass grafting that was  done in 2000.  He is only taking Lipitor and Actos at this time.  He is  not taking aspirin.   PAST MEDICAL  HISTORY:  1. Coronary artery disease, status post coronary artery bypass      grafting x3 in [place] in February 2000.  The patient had angina      at that time.  He had a left heart catheterization done.  There was      no myocardial infarction.  2. Peripheral arterial disease.  The patient had PCI in his right leg      in December 2008, also done in [place].  No further details      available.  The patient was on Plavix until July 2009.  3. Type 2 diabetes.  4. Total knee replacement in October 2008.  5. Hypercholesterolemia.  6. Hypertension.   MEDICATIONS:  1. Lipitor 20 mg daily.  2. Actos 45 mg daily.   SOCIAL HISTORY:  The patient lives in [place].  He works with Ha[employer].  He  quit smoking 4 weeks ago.  Before that, he smoked a pack a  day.  He rarely drinks alcohol in general, however, he did binge on  Friday night.   FAMILY HISTORY:  The patient's mother had hypertension and early  coronary artery disease.   REVIEW OF SYSTEMS:  Negative except as noted in history of present  illness.   LABORATORY DATA:  ABG 7.25/28.2/133, bicarb was 12.  Acetone in the  serum was positive.  Lipase was 64.  White count 15.5, hematocrit 45.5,  platelets 363.  Sodium 143, potassium 4.5, BUN 22, creatinine 0.8,  glucose 383, and anion gap was 29.  CK 107, CK-MB 14.1.  Troponin was  initially 0.45 on Saturday, then decreased to 0.20 on Sunday.  Hemoccult  blood test was positive.  Chest x-ray was negative for any  cardiopulmonary disease.  EKG shows sinus tachycardia with nonspecific T-  wave changes.   PHYSICAL EXAMINATION:  VITAL SIGNS:  Blood pressure 177/81, heart rate  was in the 130s and regular, O2 sat 100% on 2 liters nasal cannula.  GENERAL:  No apparent distress, well-developed male.  NEUROLOGIC:  Alert and oriented x3.  Normal affect.  NECK:  No JVD.  No thyromegaly or thyroid nodule.  There is no carotid  bruit.  CARDIOVASCULAR:  Heart tachycardiac.  Regular S1  and S2.  No murmur.  No  S3.  No S4.  EXTREMITIES:  There is no edema.  There are 2+ dorsalis pedis pulses  bilaterally.  LUNGS:  Clear to auscultation bilaterally.  Normal respiratory effort.  ABDOMEN:  Soft, nontender.  No hepatosplenomegaly.  There are normal  bowel sounds.  SKIN:  Normal.  HEENT:  Normal.  MUSCULOSKELETAL:  Normal.   ASSESSMENT/PLAN:  This is a 55 year old with history of coronary artery  disease, status post coronary artery bypass graft, who was admitted with  vomiting/dehydration/metabolic acidosis in the absence of any cardiac  symptoms.  The patient was noted to have mildly elevated troponin.  1. Metabolic derangement.  The patient has been vomiting for 2 days      with dehydration and the development of an anion-gap metabolic      acidosis (ketonemia).  We will plan for aggressive volume      resuscitation.  The patient is tachycardiac.  Of note, there were      some streaks of blood in the patient's emesis.  His hematocrit has      been normal.  Most likely, he has a Mallory-Weiss tear versus      gastritis.  2. Type 2 diabetes.  Blood glucose was fair in 83 on admission.  This      is likely not the cause of his presentation, but he does need      control.  3. Elevated troponin.  The patient has had no chest pain or anginal      equivalent.  Electrocardiogram is unremarkable.  The patient does      have known coronary artery disease, status post coronary artery      bypass grafting.  I doubt the patient has had an acute coronary      syndrome.  The troponin is trending down.  This is likely demand      ischemia in the setting of tachycardia and metabolic derangement in      a patient with underlying probable chronic fixed coronary artery      stenoses.  The patient has had no major bleeding and as long as  the      hematocrit is stable, I think we should start on aspirin 81 mg      daily.  We will go ahead and give Lopressor 5 mg IV then 25 mg p.o.       q.6 h. for blood pressure control.  I would replete the patient's      volume with normal saline, which would also be helpful for control      of his tachycardia.  Continue Lipitor and also we would check an      echocardiogram.  If there are no wall motion abnormalities, then we      will plan on having a stress test as an outpatient versus inpatient      cath if the patient ends up having no significant bleeding.  4. Hypertension.  I would use Lopressor for now.  We are going to add      ACE inhibitors as needed when the patient is volume replete.      Marca Ancona, MD  Electronically Signed     DM/MEDQ  D:  07/29/2008  T:  07/29/2008  Job:  [id number]

## 2011-03-30 NOTE — Assessment & Plan Note (Signed)
Gene [name]                            CARDIOLOGY OFFICE NOTE   Gene [name], Gene [name]                       MRN:          [id number]  DATE:10/30/2008                            DOB:          1956/03/21    The patient is a pleasant 55 year old gentleman who has a history of  coronary artery disease status post coronary bypassing graft in [place] in February 2000.  He was recently admitted to [facility] with a metabolic derangement.  His troponin was elevated and he  underwent cardiac catheterization on July 31, 2008, by Dr. Juanda Chance.  At that time, his LV function was normal.  There were patent renal  arteries.  The left main was free of disease.  His LAD was occluded.  There is an 80% stenosis of the small diagonal.  The circumflex had an  80% narrowing in the mid to distal circumflex, and there was a 90-95%  stenosis in each of the 2 posterolateral branches.  The RCA had an 89%  stenosis in the sub branches of the PDA and an 80% in the first  posterolateral branches.  The saphenous vein graft to the diagonal  branch of the LAD was patent.  The LIMA to the LAD was patent.  The vein  graft to the circumflex marginal was completely occluded.  It was felt  that he had some small vessel disease in the distal circumflex artery  and distal right coronary artery and medical therapy was appropriate.  Since that time, he has done well with no dyspnea, chest pain,  palpitations, or syncope.  There is no pedal edema.  He did discontinue  his tobacco use previously.   MEDICATIONS:  1. Lipitor 40 mg p.o. daily.  2. Lopressor 100 mg p.o. b.i.d.  3. Aspirin 325 mg p.o. daily.  4. Januvia.  5. Metformin.  6. Actos.  7. Lisinopril 20 mg p.o. daily.  8. Glimepiride 4 mg p.o. b.i.d.  9. Meloxicam 7.5 mg p.o. daily.   PHYSICAL EXAMINATION:  VITAL SIGNS:  Blood pressure 152/80, his pulse  was 60.  He weighs 209 pounds.  HEENT:  Normal.  NECK:   Supple.  No bruits.  CHEST:  Clear.  CARDIOVASCULAR:  Regular rate and rhythm.  ABDOMINAL:  No tenderness.  EXTREMITIES:  No edema.   Electrocardiogram shows sinus rhythm at a rate of 75.  There are no ST  changes noted.   DIAGNOSES:  1. Coronary artery disease status post coronary bypassing graft - we      will continue with medical therapy including his aspirin, beta-      blocker, ACE inhibitor, and statin.  We discussed the importance of      continuing off tobacco and also diet and exercise.  2. Hypertension - his blood pressure is mildly elevated today.  He      also states it has been mildly elevated at home for the past week.      He will track this and if his systolic is greater than 130 or his      diastolic is  greater than 5, then we will increase his medications      as needed.  3. Hyperlipidemia - we will continue his statin.  His recent HDL was      27.6.  We will add Niaspan 500 mg p.o. daily for 4 weeks, then      increase to 1 g p.o. daily.  We will check lipids and liver 4 weeks      following that.  4. Diabetes mellitus.  5. Recent decreased TSH - followup TSH was performed on September 04, 2008.  It was normal.  6. History of peripheral arterial disease - he will continue on his      aspirin, statin, and ACE inhibitor.   I will see him back in 6 months.     Madolyn Frieze Jens Som, MD, [facility]  Electronically Signed    BSC/MedQ  DD: 10/30/2008  DT: 10/31/2008  Job #: [id number]

## 2011-03-30 NOTE — Assessment & Plan Note (Signed)
[facility]                                 ON-CALL NOTE   BRONCO, MCGRORY                       MRN:          [id number]  DATE:07/16/2009                            DOB:          [birth year]    TIME:  7:09 p.m.   PHONE NUMBER:  [phone].   Dr. Artist Pais is the usual doctor   CHIEF COMPLAINT:  Stat lab call from [facility] calling.   She said that stat labs were ordered on Mr. Bala, including BMET and  then hemoglobin A1c.  Nothing came back in critical range.  His glucose  was slightly high at 127.  A1c was elevated at 8.9.  Everything else was  in the normal range, and she is just calling to report.  I advised her  to pass this on to his regular doctor.     Marne A. Tower, MD  Electronically Signed    MAT/MedQ  DD: 07/16/2009  DT: 07/17/2009  Job #: [id number]

## 2011-03-30 NOTE — Discharge Summary (Signed)
Gene [name], Gene [name] NO.:  [id number]   MEDICAL RECORD NO.:  [id number]          PATIENT TYPE:  INP   LOCATION:  2623                         FACILITY:  [facility]   PHYSICIAN:  Peggye Pitt, M.D. DATE OF BIRTH:  [birth year]   DATE OF ADMISSION:  07/29/2008  DATE OF DISCHARGE:  08/01/2008                               DISCHARGE SUMMARY   DISCHARGE DIAGNOSES:  1. Non-ST elevation myocardial infarction.  2. History of coronary artery disease.  3. History of hypertension.  4. History of hyperlipidemia.  5. History of type 2 diabetes.  6. Hypokalemia.  7. High anion gap metabolic acidosis likely secondary to early      diabetic ketoacidosis, resolved.  8. Tobacco abuse.   DISCHARGE MEDICATIONS:  1. Aspirin 325 mg daily.  2. Lipitor 40 mg daily at bedtime.  3. Lopressor 100 mg b.i.d.  4. Lisinopril 5 mg daily.  5. Nitroglycerin 0.5 mg sublingual as needed for chest pain.  6. Amaryl 2 mg daily.  7. Actos 45 mg daily.  8. Januvia 50 mg daily.  9. Metformin 500 mg b.i.d.  10.KCl 40 mEq twice daily for 2 days.   DISPOSITION AND FOLLOWUP:  The patient has recently moved to [place],  [place] from [place].  At this time, he has not yet  established care with the PCP.  I have spoken to both him and his wife  today, and we have discussed his need to follow up with the regular  primary care physician given his chronic medical comorbidities, and they  have decided to get in touch with a primary care physician in the area  today or tomorrow.  The patient also has a followup appointment  scheduled with Dr. Jens Som in [facility] for November 06, 2008, at 9:45  a.m.   CONSULTATION:  [facility], specifically Dr. Shirlee Latch.   PROCEDURES AND IMAGING:  Done during this hospitalization include a  chest x-ray on July 29, 2008, that showed no acute disease.  The  patient also had a 2D echo on July 29, 2008, that showed an  ejection fraction of  55%-65% with no left ventricular regional wall  motion abnormalities with mildly increased left ventricular wall  thickness.  The patient also had a cardiac cath on July 31, 2008.  Formal report is not available at the time of discharge, but per note in  the chart, it appears that he did have some disease in his graft that  looked old, but did not appear to be obstructive and the plan was for  medical treatment.   HISTORY AND PHYSICAL EXAM:  For full details, please refer to HPI  dictated by Dr. Chanda Busing on date, July 29, 2008.  But in  brief, Gene [name] is a 55 year old white man with a history of diabetes,  coronary artery disease, and hypertension, who recently relocated [place]  [name] in February 2009.  Therefore,  he had run out of all of his prescription medications.  He recently got  a new job and went out to celebrate and drank 2  bottles of wine.  Subsequently, he came into the emergency department with nausea,  vomiting, a high anion gap metabolic acidosis with ketosis which  resolved, but because of this we were called to admit him.   HOSPITAL COURSE:  1. Non-ST elevation MI/coronary artery disease.  We greatly      appreciated Dr. Shirlee Latch in [facility] assistance in this      matter.  He was cathed, and he was placed on an appropriate medical      regimen.  No chest pain at the time of discharge.  He will be      followed up by Cardiology for this issue.  2. High anion gap metabolic acidosis, has resolved.  3. Type 2 diabetes, was discharged on Actos, Amaryl, Januvia, and we      will add metformin. His primary care physician will need to further      address his glycemic control.  4. Hypertension.  His blood pressure is slightly elevated at the time      of discharge at 170/85, so at this point, Cardiology has decided to      increase his metoprolol to 100 mg b.i.d. and this is something that      will need to be  followed up as an outpatient.  5. Hyperlipidemia.  He was maintained on statin throughout his      hospitalization.  6. Tobacco abuse.  He has received aggressive smoking cessation      counseling and we placed him on a nicotine patch while in the      hospital.  7. Hypokalemia of 2.9 that was noted on day of discharge.  We will      give the patient 40 mEq of KCl before leaving the hospital and then      we will also give a prescription for KCl twice daily for 2 more      days.   My recommendation will be for his PCP to recheck the BMET and make sure  that his potassium is stable.   Vital signs on day of discharge, blood pressure 170/85, heart rate 85,  respirations 18, O2 sats 96% on room air with a temperature of 99.5.  Sodium 134, potassium 2.9, chloride 92, bicarb 33, BUN 6, creatinine  0.67, glucose of 186.  WBC is 9.0, hemoglobin 12.1, and platelets of  231.      Peggye Pitt, M.D.  Electronically Signed     EH/MEDQ  D:  08/01/2008  T:  08/02/2008  Job:  [id number]   cc:   Veverly Fells. Excell Seltzer, MD  Marca Ancona, MD

## 2011-03-30 NOTE — Assessment & Plan Note (Signed)
[facility]                            CARDIOLOGY OFFICE NOTE   YOSGAR, DEMIRJIAN                       MRN:          [id number]  DATE:08/07/2008                            DOB:          11/07/1956    Mr. Vallin is a 55 year old male who was recently admitted to [facility] on July 29, 2008.  He had recently moved from [place]  to [place] and had not been taking his medications.  Note, he  does have a history of coronary artery bypassing graft in [place] in  February 2000.  He also has peripheral arterial disease and he has had  prior PTA of his right lower extremity.  He did have an echocardiogram  during that admission, which showed an ejection fraction of 55-65%.  When he came to the hospital, he apparently had consumed two bottles of  wine in celebration of finding a new job.  He also had not been taken  his medications and he came in with nausea, vomiting, and a high anion  gap metabolic acidosis.  He also had cardiac markers checked and his  troponin was elevated.  The peak was 0.57.  He apparently underwent a  cardiac catheterization, but I do not have those results available.  Medical therapy was recommended.  All of his medications were  reinitiated.  Note, his TSH was low during that hospitalization, but his  free T4 was normal.  Since discharge, he has felt well other than  fatigue.  There is no dyspnea on exertion, orthopnea, PND, pedal edema,  palpitations, presyncope, syncope or chest pain.   MEDICATIONS:  1. Lisinopril 5 mg p.o. daily.  2. Lipitor 40 mg p.o. daily.  3. Metoprolol mg p.o. b.i.d.  4. Glimepiride 2 mg p.o. daily.  5. Aspirin 325 mg p.o. daily.  6. Januvia 50 daily.  7. Metformin 500 mg p.o. daily.  8. Actos 45 mg p.o. daily.   PHYSICAL EXAMINATION:  VITAL SIGNS:  Blood pressure 114/80 and his pulse  is 75.  HEENT:  Normal.  NECK:  Supple with no bruits.  CHEST:  Clear.  CARDIOVASCULAR:  Regular rate and rhythm.  ABDOMEN:  No tenderness.  I cannot appreciate bruits or pulsatile  masses.  His left groin shows no hematoma, no bruit.  EXTREMITIES:  No  edema.   His electrocardiogram shows a sinus rhythm at a rate of 75.  The axis is  normal.  There is evidence of left ventricular hypertrophy.   DIAGNOSES:  1. Coronary artery disease status post coronary artery bypassing graft      - The patient had a recent cardiac catheterization and apparently      medical therapy was recommended, but the final results are not      available.  He will continue on his aspirin, beta-blocker, ACE      inhibitor and statin.  We have discussed the importance of avoiding      tobacco as well as continue with exercise and diet.  2. Hypertension - His blood pressure is adequately controlled on his  present medications.  We will check a BMET in 4 weeks when he      returns for blood work.  3. Hyperlipidemia - He will continue on his statin.  We will check      lipids and liver in 4 weeks as well.  Our goal LDL will be less      than 70 given his history of coronary artery disease.  4. Tobacco abuse - He has discontinued this now for 5 weeks and I      encouraged him to continue this.  5. Diabetes mellitus - Management per his primary care physician - He      is scheduled see Dr. Andrey Campanile later today.  6. Recent decreased TSH - He will also follow up with Dr. Andrey Campanile      concerning this issue.  7. Peripheral arterial disease - He will continue on his aspirin and      statin as well as his ACE inhibitor.   We will see him back in approximately 3 months.     Madolyn Frieze Jens Som, MD, [facility]  Electronically Signed    BSC/MedQ  DD: 08/07/2008  DT: 08/07/2008  Job #: [id number]

## 2011-04-23 ENCOUNTER — Ambulatory Visit: Payer: Federal, State, Local not specified - PPO | Admitting: Internal Medicine

## 2011-05-25 ENCOUNTER — Telehealth: Payer: Self-pay | Admitting: Internal Medicine

## 2011-05-25 NOTE — Telephone Encounter (Signed)
Please advise pt that we can given him #7 to hold him over until he can schedule the appt for next week. We cannot give more until he is seen.

## 2011-05-25 NOTE — Telephone Encounter (Signed)
Pt states that we denied him a refill on simvastatin. He states that he will make an appt next week with Dr. Rodena Medin but would like to know if we will refill one time. Pt states that he is out.

## 2011-05-26 NOTE — Telephone Encounter (Signed)
Left message for patient to return my call.

## 2011-05-27 NOTE — Telephone Encounter (Signed)
Left another message for patient to return my call.

## 2011-06-01 NOTE — Telephone Encounter (Signed)
Left another message for patient to return my call.

## 2011-07-05 ENCOUNTER — Telehealth: Payer: Self-pay | Admitting: Internal Medicine

## 2011-07-05 ENCOUNTER — Other Ambulatory Visit: Payer: Self-pay | Admitting: Internal Medicine

## 2011-07-05 DIAGNOSIS — IMO0001 Reserved for inherently not codable concepts without codable children: Secondary | ICD-10-CM

## 2011-07-05 NOTE — Telephone Encounter (Signed)
Rx refill sent to pharmacy for one month supply. Patient is pas due for office visit. Appointment scheduled for 07/08/2011 with Dr.Thomas Hodgin.

## 2011-07-05 NOTE — Telephone Encounter (Signed)
Patient states that he was told to do labs prior to appt. Patient was supposed to come in for follow up in June. Has appt on 07-08-11. Patient said that he will go downstairs tomorrow morning for labs.

## 2011-07-06 LAB — BASIC METABOLIC PANEL WITH GFR
BUN: 20 mg/dL (ref 6–23)
CO2: 27 meq/L (ref 19–32)
Calcium: 8.9 mg/dL (ref 8.4–10.5)
Chloride: 102 meq/L (ref 96–112)
Creat: 0.91 mg/dL (ref 0.50–1.35)
Glucose, Bld: 161 mg/dL — ABNORMAL HIGH (ref 70–99)
Potassium: 4.3 meq/L (ref 3.5–5.3)
Sodium: 140 meq/L (ref 135–145)

## 2011-07-06 LAB — HEMOGLOBIN A1C
Hgb A1c MFr Bld: 6.9 % — ABNORMAL HIGH (ref <5.7)
Mean Plasma Glucose: 151 mg/dL — ABNORMAL HIGH (ref <117)

## 2011-07-06 NOTE — Telephone Encounter (Signed)
Lab order entered for [facility]

## 2011-07-08 ENCOUNTER — Ambulatory Visit (INDEPENDENT_AMBULATORY_CARE_PROVIDER_SITE_OTHER): Payer: Federal, State, Local not specified - PPO | Admitting: Internal Medicine

## 2011-07-08 ENCOUNTER — Encounter: Payer: Self-pay | Admitting: Internal Medicine

## 2011-07-08 DIAGNOSIS — E119 Type 2 diabetes mellitus without complications: Secondary | ICD-10-CM

## 2011-07-08 DIAGNOSIS — E785 Hyperlipidemia, unspecified: Secondary | ICD-10-CM

## 2011-07-08 DIAGNOSIS — E1149 Type 2 diabetes mellitus with other diabetic neurological complication: Secondary | ICD-10-CM

## 2011-07-08 MED ORDER — PREGABALIN 50 MG PO CAPS: 50.0000 mg | ORAL_CAPSULE | Freq: Two times a day (BID) | ORAL | Status: DC

## 2011-07-08 NOTE — Patient Instructions (Signed)
Please schedule cbc, chem7, a1c, urine microalbumin 250.0 and lipid/lft 272.4 prior to next visit 

## 2011-07-11 NOTE — Progress Notes (Signed)
  Subjective:    Patient ID: Gene [name], male    DOB: 15-May-1956, 55 y.o.   MRN: [id number]  HPI Pt presents to clinic for followup of multiple medical problems. . H/o dm with last a1c reviewed 6.9 down from 7.3. FSBS 74-124 with avg 100. No hypoglycemia. Tolerates statin tx without myalgias or abn lft. BP reviewed under average control. Notes numbness of both feet in plantar area. No radicular leg pain.   Past Medical History  Diagnosis Date  . History of colonic polyps   . Diabetes mellitus type II   . GERD (gastroesophageal reflux disease)   . Hyperlipidemia   . Hypertension   . CAD (coronary artery disease)   . PVD (peripheral vascular disease)     PTA RLE 12/08 [place]  . Diabetic neuropathy   . Plantar fasciitis   . Chronic pain in right foot   . Alcohol abuse, episodic    Past Surgical History  Procedure Date  . Coronary artery bypass graft     200 SVG to diagonal, LIMA to LAD, SVG to LCX  . Other surgical history     PAD surgery  . Revision total knee arthroplasty     reports that he has been smoking.  He has never used smokeless tobacco. He reports that he uses illicit drugs. He reports that he does not drink alcohol. family history includes Coronary artery disease in his mother; Hyperlipidemia in an unspecified family member; Hypertension in an unspecified family member; and Stroke in an unspecified family member. No Known Allergies   Review of Systems see hpi     Objective:   Physical Exam  Physical Exam  Nursing note and vitals reviewed. Constitutional: Appears well-developed and well-nourished. No distress.  HENT:  Head: Normocephalic and atraumatic.  Right Ear: External ear normal.  Left Ear: External ear normal.  Eyes: Conjunctivae are normal. No scleral icterus.  Neck: Neck supple. Carotid bruit is not present.  Cardiovascular: Normal rate, regular rhythm and normal heart sounds.  Exam reveals no gallop and no friction rub.   No murmur  heard. Pulmonary/Chest: Effort normal and breath sounds normal. No respiratory distress. He has no wheezes. no rales.  Lymphadenopathy:    He has no cervical adenopathy.  Neurological:Alert.  Skin: Skin is warm and dry. Not diaphoretic.  Psychiatric: Has a normal mood and affect.   Diabetic foot exam: no wounds/ulcerations. Decreased sensation bilateral plantar with monofilament.     Assessment & Plan:

## 2011-07-11 NOTE — Assessment & Plan Note (Signed)
Continue statin tx. Obtain lipid/lft prior to next visit

## 2011-07-11 NOTE — Assessment & Plan Note (Signed)
Attempt lyrica 50mg  bid

## 2011-07-11 NOTE — Assessment & Plan Note (Signed)
Improving control. Continue current regiment. Obtain cbc, chem7, a1c and urine microalbumin prior to next visit

## 2011-07-21 ENCOUNTER — Other Ambulatory Visit: Payer: Self-pay | Admitting: Internal Medicine

## 2011-07-21 DIAGNOSIS — E785 Hyperlipidemia, unspecified: Secondary | ICD-10-CM

## 2011-07-21 DIAGNOSIS — E119 Type 2 diabetes mellitus without complications: Secondary | ICD-10-CM

## 2011-07-23 ENCOUNTER — Telehealth: Payer: Self-pay

## 2011-07-23 NOTE — Telephone Encounter (Signed)
LMTCB

## 2011-07-23 NOTE — Telephone Encounter (Signed)
Message copied by Beverely Low on Fri Jul 23, 2011  4:00 PM ------      Message from: Noralee Stain D      Created: Mon Jul 12, 2011  8:34 AM                   ----- Message -----         From: Thomos Lemons, DO         Sent: 07/07/2011  12:49 PM           To: Rolly Salter            plz call pt to see whether he will be coming to [place] for next follow up visit

## 2011-08-03 ENCOUNTER — Other Ambulatory Visit: Payer: Self-pay | Admitting: Internal Medicine

## 2011-08-03 ENCOUNTER — Telehealth: Payer: Self-pay | Admitting: Internal Medicine

## 2011-08-03 MED ORDER — AMLODIPINE BESYLATE 10 MG PO TABS: 10.0000 mg | ORAL_TABLET | Freq: Every day | ORAL | Status: DC

## 2011-08-03 MED ORDER — METFORMIN HCL 1000 MG PO TABS: 1000.0000 mg | ORAL_TABLET | Freq: Two times a day (BID) | ORAL | Status: DC

## 2011-08-03 MED ORDER — LISINOPRIL 40 MG PO TABS: 40.0000 mg | ORAL_TABLET | Freq: Every day | ORAL | Status: DC

## 2011-08-03 MED ORDER — PREGABALIN 75 MG PO CAPS: 75.0000 mg | ORAL_CAPSULE | Freq: Two times a day (BID) | ORAL | Status: DC

## 2011-08-03 NOTE — Telephone Encounter (Signed)
Patient called and left voice message requesting refills on his medication. He is also wanting to know if he could have a new Rx for Lyrica 75 mg. He stated the current 50 mg dose is not controlling his peripheral neuropathy in his feet.

## 2011-08-03 NOTE — Telephone Encounter (Signed)
Rx refills sent to pharmacy. Rx for Lyrica-verbal provided to pharmacist Joe at [facility].

## 2011-08-03 NOTE — Telephone Encounter (Signed)
Ok to increase to 75 mg 

## 2011-08-03 NOTE — Telephone Encounter (Signed)
Refill-amlodipine 10mg  tab. Take one tablet by mouth every day. Qty 30. Last fill 8.20.12  Refill-metformin 1000mg  tab. Take one tablet by mouth twice daily. Qty 30. Last fill 8.20.12  Refill- lisinopril 40mg  tab. Take one tablet by mouth every day. Qty 30 last fill 8.20.12

## 2011-08-16 LAB — DIFFERENTIAL
Basophils Absolute: 0
Basophils Relative: 0
Eosinophils Absolute: 0
Eosinophils Relative: 0
Lymphocytes Relative: 36
Lymphs Abs: 3.2
Monocytes Absolute: 0.8
Monocytes Relative: 9
Neutro Abs: 4.9
Neutrophils Relative %: 55

## 2011-08-16 LAB — URINE MICROSCOPIC-ADD ON

## 2011-08-16 LAB — BASIC METABOLIC PANEL
BUN: 11
BUN: 6
CO2: 24
CO2: 33 — ABNORMAL HIGH
Calcium: 8.1 — ABNORMAL LOW
Calcium: 8.9
Chloride: 103
Chloride: 92 — ABNORMAL LOW
Creatinine, Ser: 0.67
Creatinine, Ser: 0.89
GFR calc Af Amer: >60
GFR calc Af Amer: >60
GFR calc non Af Amer: >60
GFR calc non Af Amer: >60
Glucose, Bld: 186 — ABNORMAL HIGH
Glucose, Bld: 213 — ABNORMAL HIGH
Potassium: 2.9 — ABNORMAL LOW
Potassium: 4.3
Sodium: 134 — ABNORMAL LOW
Sodium: 138

## 2011-08-16 LAB — TROPONIN I: Troponin I: 0.35 — ABNORMAL HIGH

## 2011-08-16 LAB — GLUCOSE, CAPILLARY
Glucose-Capillary: 181 — ABNORMAL HIGH
Glucose-Capillary: 199 — ABNORMAL HIGH
Glucose-Capillary: 206 — ABNORMAL HIGH
Glucose-Capillary: 215 — ABNORMAL HIGH
Glucose-Capillary: 225 — ABNORMAL HIGH
Glucose-Capillary: 227 — ABNORMAL HIGH
Glucose-Capillary: 238 — ABNORMAL HIGH
Glucose-Capillary: 248 — ABNORMAL HIGH
Glucose-Capillary: 292 — ABNORMAL HIGH
Glucose-Capillary: 302 — ABNORMAL HIGH

## 2011-08-16 LAB — HEPATIC FUNCTION PANEL
ALT: 17
AST: 15
Albumin: 3.3 — ABNORMAL LOW
Alkaline Phosphatase: 82
Bilirubin, Direct: 0.2
Indirect Bilirubin: 1.3 — ABNORMAL HIGH
Total Bilirubin: 1.5 — ABNORMAL HIGH
Total Protein: 6.2

## 2011-08-16 LAB — CBC
HCT: 35 — ABNORMAL LOW
HCT: 37.2 — ABNORMAL LOW
Hemoglobin: 12.1 — ABNORMAL LOW
Hemoglobin: 12.6 — ABNORMAL LOW
MCHC: 33.9
MCHC: 34.5
MCV: 88.9
MCV: 89.7
Platelets: 239
Platelets: 284
RBC: 3.94 — ABNORMAL LOW
RBC: 4.15 — ABNORMAL LOW
RDW: 14.2
RDW: 14.5
WBC: 13.4 — ABNORMAL HIGH
WBC: 9

## 2011-08-16 LAB — URINALYSIS, ROUTINE W REFLEX MICROSCOPIC
Bilirubin Urine: NEGATIVE
Glucose, UA: >1000 — AB
Ketones, ur: >80 — AB
Leukocytes, UA: NEGATIVE
Nitrite: NEGATIVE
Protein, ur: 30 — AB
Specific Gravity, Urine: 1.036 — ABNORMAL HIGH
Urobilinogen, UA: 0.2
pH: 5.5

## 2011-08-16 LAB — PROTIME-INR
INR: 1.1
Prothrombin Time: 14.3

## 2011-08-16 LAB — URINE CULTURE: Colony Count: 7000

## 2011-08-16 LAB — CK TOTAL AND CKMB (NOT AT ARMC)
CK, MB: 2.4
Relative Index: INVALID
Total CK: 58

## 2011-08-18 LAB — COMPREHENSIVE METABOLIC PANEL
ALT: 29
ALT: 30
AST: 25
AST: 30
Albumin: 5.3 — ABNORMAL HIGH
Albumin: 5.4 — ABNORMAL HIGH
Alkaline Phosphatase: 132 — ABNORMAL HIGH
Alkaline Phosphatase: 138 — ABNORMAL HIGH
BUN: 22
BUN: 22
CO2: 15 — ABNORMAL LOW
CO2: 18 — ABNORMAL LOW
Calcium: 10.1
Calcium: 10.2
Chloride: 99
Chloride: 99
Creatinine, Ser: 0.7
Creatinine, Ser: 0.8
GFR calc Af Amer: >60
GFR calc Af Amer: >60
GFR calc non Af Amer: >60
GFR calc non Af Amer: >60
Glucose, Bld: 383 — ABNORMAL HIGH
Glucose, Bld: 396 — ABNORMAL HIGH
Potassium: 4.5
Potassium: 4.5
Sodium: 143
Sodium: 144
Total Bilirubin: 0.8
Total Bilirubin: 0.9
Total Protein: 8.4 — ABNORMAL HIGH
Total Protein: 8.5 — ABNORMAL HIGH

## 2011-08-18 LAB — LIPID PANEL
Cholesterol: 174
HDL: 46
LDL Cholesterol: 111 — ABNORMAL HIGH
Total CHOL/HDL Ratio: 3.8
Triglycerides: 84
VLDL: 17

## 2011-08-18 LAB — GLUCOSE, CAPILLARY
Glucose-Capillary: 177 — ABNORMAL HIGH
Glucose-Capillary: 191 — ABNORMAL HIGH
Glucose-Capillary: 201 — ABNORMAL HIGH
Glucose-Capillary: 214 — ABNORMAL HIGH
Glucose-Capillary: 246 — ABNORMAL HIGH
Glucose-Capillary: 253 — ABNORMAL HIGH
Glucose-Capillary: 259 — ABNORMAL HIGH
Glucose-Capillary: 261 — ABNORMAL HIGH
Glucose-Capillary: 261 — ABNORMAL HIGH
Glucose-Capillary: 275 — ABNORMAL HIGH
Glucose-Capillary: 303 — ABNORMAL HIGH
Glucose-Capillary: 351 — ABNORMAL HIGH

## 2011-08-18 LAB — HEMOGLOBIN A1C
Hgb A1c MFr Bld: 11.8 — ABNORMAL HIGH
Hgb A1c MFr Bld: 13.7 — ABNORMAL HIGH
Mean Plasma Glucose: 292
Mean Plasma Glucose: 346

## 2011-08-18 LAB — OCCULT BLOOD X 1 CARD TO LAB, STOOL: Fecal Occult Bld: POSITIVE

## 2011-08-18 LAB — POCT I-STAT 3, VENOUS BLOOD GAS (G3P V)
Acid-base deficit: 10 — ABNORMAL HIGH
Bicarbonate: 17.3 — ABNORMAL LOW
O2 Saturation: 23
Patient temperature: 98.6
TCO2: 19
pCO2, Ven: 40.7 — ABNORMAL LOW
pH, Ven: 7.237 — ABNORMAL LOW
pO2, Ven: 20 — CL

## 2011-08-18 LAB — BASIC METABOLIC PANEL
BUN: 18
BUN: 27 — ABNORMAL HIGH
CO2: 17 — ABNORMAL LOW
CO2: 28
Calcium: 8.7
Calcium: 8.9
Chloride: 106
Chloride: 94 — ABNORMAL LOW
Creatinine, Ser: 0.97
Creatinine, Ser: 1.76 — ABNORMAL HIGH
GFR calc Af Amer: 49 — ABNORMAL LOW
GFR calc Af Amer: >60
GFR calc non Af Amer: 41 — ABNORMAL LOW
GFR calc non Af Amer: >60
Glucose, Bld: 227 — ABNORMAL HIGH
Glucose, Bld: 94
Potassium: 4.4
Potassium: 4.5
Sodium: 131 — ABNORMAL LOW
Sodium: 137

## 2011-08-18 LAB — DIFFERENTIAL
Basophils Absolute: 0
Basophils Absolute: 0
Basophils Relative: 0
Basophils Relative: 0
Eosinophils Absolute: 0
Eosinophils Absolute: 0
Eosinophils Relative: 0
Eosinophils Relative: 0
Lymphocytes Relative: 10 — ABNORMAL LOW
Lymphocytes Relative: 9 — ABNORMAL LOW
Lymphs Abs: 1.3
Lymphs Abs: 1.6
Monocytes Absolute: 0.5
Monocytes Absolute: 0.6
Monocytes Relative: 3
Monocytes Relative: 4
Neutro Abs: 13.3 — ABNORMAL HIGH
Neutro Abs: 13.7 — ABNORMAL HIGH
Neutrophils Relative %: 86 — ABNORMAL HIGH
Neutrophils Relative %: 88 — ABNORMAL HIGH

## 2011-08-18 LAB — CK: Total CK: 107

## 2011-08-18 LAB — CBC
HCT: 38.1 — ABNORMAL LOW
HCT: 45.5
HCT: 46.1
Hemoglobin: 12.7 — ABNORMAL LOW
Hemoglobin: 15.1
Hemoglobin: 15.5
MCHC: 33.2
MCHC: 33.4
MCHC: 33.7
MCV: 88.4
MCV: 89.1
MCV: 90.4
Platelets: 310
Platelets: 328
Platelets: 363
RBC: 4.22
RBC: 5.1
RBC: 5.22
RDW: 13.7
RDW: 14.2
RDW: 15
WBC: 14.6 — ABNORMAL HIGH
WBC: 15.5 — ABNORMAL HIGH
WBC: 15.5 — ABNORMAL HIGH

## 2011-08-18 LAB — POCT CARDIAC MARKERS
CKMB, poc: 14.1
CKMB, poc: 7.9
Myoglobin, poc: 103
Myoglobin, poc: 80.1
Troponin i, poc: 0.2 — ABNORMAL HIGH
Troponin i, poc: 0.45 — ABNORMAL HIGH

## 2011-08-18 LAB — CARDIAC PANEL(CRET KIN+CKTOT+MB+TROPI)
CK, MB: 6 — ABNORMAL HIGH
CK, MB: 7.3 — ABNORMAL HIGH
CK, MB: 8.9 — ABNORMAL HIGH
Relative Index: 6.8 — ABNORMAL HIGH
Relative Index: 7.8 — ABNORMAL HIGH
Relative Index: INVALID
Total CK: 107
Total CK: 114
Total CK: 87
Troponin I: 0.42 — ABNORMAL HIGH
Troponin I: 0.49 — ABNORMAL HIGH
Troponin I: 0.57

## 2011-08-18 LAB — TSH: TSH: 0.146 — ABNORMAL LOW

## 2011-08-18 LAB — URINALYSIS, ROUTINE W REFLEX MICROSCOPIC
Bilirubin Urine: NEGATIVE
Glucose, UA: >1000 — AB
Ketones, ur: >80 — AB
Leukocytes, UA: NEGATIVE
Nitrite: NEGATIVE
Protein, ur: 100 — AB
Specific Gravity, Urine: 1.027
Urobilinogen, UA: 0.2
pH: 5

## 2011-08-18 LAB — POCT I-STAT 3, ART BLOOD GAS (G3+)
Acid-base deficit: 13 — ABNORMAL HIGH
Bicarbonate: 12.3 — ABNORMAL LOW
O2 Saturation: 99
Patient temperature: 98.6
TCO2: 13
pCO2 arterial: 28.2 — ABNORMAL LOW
pH, Arterial: 7.249 — ABNORMAL LOW
pO2, Arterial: 133 — ABNORMAL HIGH

## 2011-08-18 LAB — HEMOGLOBIN AND HEMATOCRIT, BLOOD
HCT: 39.3
HCT: 40.6
Hemoglobin: 13.2
Hemoglobin: 13.8

## 2011-08-18 LAB — KETONES, QUALITATIVE: Acetone, Bld: POSITIVE — AB

## 2011-08-18 LAB — PHOSPHORUS: Phosphorus: 5.1 — ABNORMAL HIGH

## 2011-08-18 LAB — URINE MICROSCOPIC-ADD ON

## 2011-08-18 LAB — T4, FREE: Free T4: 1.16

## 2011-08-18 LAB — MAGNESIUM: Magnesium: 2

## 2011-08-18 LAB — GASTRIC OCCULT BLOOD (1-CARD TO LAB): Occult Blood, Gastric: POSITIVE — AB

## 2011-08-18 LAB — LIPASE, BLOOD
Lipase: 52
Lipase: 64

## 2011-09-06 ENCOUNTER — Telehealth: Payer: Self-pay | Admitting: Internal Medicine

## 2011-09-06 MED ORDER — LABETALOL HCL 200 MG PO TABS: 200.0000 mg | ORAL_TABLET | Freq: Two times a day (BID) | ORAL | Status: DC

## 2011-09-06 NOTE — Telephone Encounter (Signed)
Refill- labetalol 200mg  tab. Take one tablet by mouth twice daily. Qty 60. Last fill 8.20.12

## 2011-09-06 NOTE — Telephone Encounter (Signed)
Rx refill for Labetalol sent to pharmacy.

## 2011-10-04 ENCOUNTER — Other Ambulatory Visit: Payer: Self-pay | Admitting: Internal Medicine

## 2011-10-05 LAB — BASIC METABOLIC PANEL
BUN: 19 mg/dL (ref 6–23)
CO2: 30 mEq/L (ref 19–32)
Calcium: 9.7 mg/dL (ref 8.4–10.5)
Chloride: 102 mEq/L (ref 96–112)
Creat: 0.74 mg/dL (ref 0.50–1.35)
Glucose, Bld: 102 mg/dL — ABNORMAL HIGH (ref 70–99)
Potassium: 4.7 mEq/L (ref 3.5–5.3)
Sodium: 140 mEq/L (ref 135–145)

## 2011-10-05 LAB — HEMOGLOBIN A1C
Hgb A1c MFr Bld: 6.8 % — ABNORMAL HIGH (ref <5.7)
Mean Plasma Glucose: 148 mg/dL — ABNORMAL HIGH (ref <117)

## 2011-10-06 ENCOUNTER — Telehealth: Payer: Self-pay | Admitting: Internal Medicine

## 2011-10-06 ENCOUNTER — Ambulatory Visit (INDEPENDENT_AMBULATORY_CARE_PROVIDER_SITE_OTHER): Payer: Federal, State, Local not specified - PPO | Admitting: Internal Medicine

## 2011-10-06 ENCOUNTER — Encounter: Payer: Self-pay | Admitting: Internal Medicine

## 2011-10-06 VITALS — BP 110/70 | HR 73 | Temp 97.9°F | Resp 18 | Wt 203.0 lb

## 2011-10-06 DIAGNOSIS — Z23 Encounter for immunization: Secondary | ICD-10-CM

## 2011-10-06 DIAGNOSIS — E119 Type 2 diabetes mellitus without complications: Secondary | ICD-10-CM

## 2011-10-06 DIAGNOSIS — G609 Hereditary and idiopathic neuropathy, unspecified: Secondary | ICD-10-CM

## 2011-10-06 DIAGNOSIS — E1149 Type 2 diabetes mellitus with other diabetic neurological complication: Secondary | ICD-10-CM

## 2011-10-06 DIAGNOSIS — G629 Polyneuropathy, unspecified: Secondary | ICD-10-CM

## 2011-10-06 DIAGNOSIS — E785 Hyperlipidemia, unspecified: Secondary | ICD-10-CM

## 2011-10-06 MED ORDER — PREGABALIN 100 MG PO CAPS: 75.0000 mg | ORAL_CAPSULE | Freq: Two times a day (BID) | ORAL | Status: DC

## 2011-10-06 MED ORDER — ATORVASTATIN CALCIUM 20 MG PO TABS: 20.0000 mg | ORAL_TABLET | Freq: Every day | ORAL | Status: DC

## 2011-10-06 MED ORDER — AMLODIPINE BESYLATE 10 MG PO TABS: 10.0000 mg | ORAL_TABLET | Freq: Every day | ORAL | Status: DC

## 2011-10-06 MED ORDER — LISINOPRIL 40 MG PO TABS: 40.0000 mg | ORAL_TABLET | Freq: Every day | ORAL | Status: DC

## 2011-10-06 MED ORDER — LABETALOL HCL 200 MG PO TABS: 200.0000 mg | ORAL_TABLET | Freq: Two times a day (BID) | ORAL | Status: DC

## 2011-10-06 MED ORDER — GLIMEPIRIDE 4 MG PO TABS: 4.0000 mg | ORAL_TABLET | Freq: Every day | ORAL | Status: DC

## 2011-10-06 NOTE — Assessment & Plan Note (Signed)
Average control. Continue current regimen. Schedule diabetic eye exam

## 2011-10-06 NOTE — Assessment & Plan Note (Signed)
Begin lipitor 20mg  qd. Recheck lipid/lft prior to next visit

## 2011-10-06 NOTE — Patient Instructions (Signed)
Please schedule cbc, chem7, a1c, urine microalbumin 250.0 and lipid/lft 272.4 prior to next visit 

## 2011-10-06 NOTE — Assessment & Plan Note (Signed)
Increase lyrica 100mg  bid

## 2011-10-06 NOTE — Telephone Encounter (Signed)
Lab entered for February 2013.  

## 2011-10-06 NOTE — Progress Notes (Signed)
  Subjective:    Patient ID: Gene [name], male    DOB: Jun 30, 1956, 55 y.o.   MRN: [id number]  HPI Pt presents to clinic for followup of multiple medical problems. Reports fsbs range 78-127 without hypoglycemia. Off statin x 6months but not due to side effects. Wt down 4lbs. BP reviewed normotensive. Neuropathy under suboptimal control. Tolerating lyrica without side effect. No other complaints.  Past Medical History  Diagnosis Date  . History of colonic polyps   . Diabetes mellitus type II   . GERD (gastroesophageal reflux disease)   . Hyperlipidemia   . Hypertension   . CAD (coronary artery disease)   . PVD (peripheral vascular disease)     PTA RLE 12/08 [place]  . Diabetic neuropathy   . Plantar fasciitis   . Chronic pain in right foot   . Alcohol abuse, episodic    Past Surgical History  Procedure Date  . Coronary artery bypass graft     200 SVG to diagonal, LIMA to LAD, SVG to LCX  . Other surgical history     PAD surgery  . Revision total knee arthroplasty     reports that he has been smoking.  He has never used smokeless tobacco. He reports that he uses illicit drugs. He reports that he does not drink alcohol. family history includes Coronary artery disease in his mother; Hyperlipidemia in an unspecified family member; Hypertension in an unspecified family member; and Stroke in an unspecified family member. No Known Allergies   Review of Systems see hpi     Objective:   Physical Exam  Physical Exam  Nursing note and vitals reviewed. Constitutional: Appears well-developed and well-nourished. No distress.  HENT:  Head: Normocephalic and atraumatic.  Right Ear: External ear normal.  Left Ear: External ear normal.  Eyes: Conjunctivae are normal. No scleral icterus.  Neck: Neck supple. Carotid bruit is not present.  Cardiovascular: Normal rate, regular rhythm and normal heart sounds.  Exam reveals no gallop and no friction rub.   No murmur  heard. Pulmonary/Chest: Effort normal and breath sounds normal. No respiratory distress. He has no wheezes. no rales.  Lymphadenopathy:    He has no cervical adenopathy.  Neurological:Alert.  Skin: Skin is warm and dry. Not diaphoretic.  Psychiatric: Has a normal mood and affect.        Assessment & Plan:

## 2012-01-05 ENCOUNTER — Ambulatory Visit: Payer: Federal, State, Local not specified - PPO | Admitting: Internal Medicine

## 2012-01-07 ENCOUNTER — Other Ambulatory Visit: Payer: Self-pay | Admitting: *Deleted

## 2012-01-07 DIAGNOSIS — E785 Hyperlipidemia, unspecified: Secondary | ICD-10-CM

## 2012-01-07 DIAGNOSIS — E119 Type 2 diabetes mellitus without complications: Secondary | ICD-10-CM

## 2012-01-07 LAB — CBC WITH DIFFERENTIAL/PLATELET
Basophils Absolute: 0 10*3/uL (ref 0.0–0.1)
Basophils Relative: 0 % (ref 0–1)
Eosinophils Absolute: 0.3 10*3/uL (ref 0.0–0.7)
Eosinophils Relative: 2 % (ref 0–5)
HCT: 37.9 % — ABNORMAL LOW (ref 39.0–52.0)
Hemoglobin: 12.2 g/dL — ABNORMAL LOW (ref 13.0–17.0)
Lymphocytes Relative: 23 % (ref 12–46)
Lymphs Abs: 2.5 10*3/uL (ref 0.7–4.0)
MCH: 29 pg (ref 26.0–34.0)
MCHC: 32.2 g/dL (ref 30.0–36.0)
MCV: 90.2 fL (ref 78.0–100.0)
Monocytes Absolute: 0.8 10*3/uL (ref 0.1–1.0)
Monocytes Relative: 7 % (ref 3–12)
Neutro Abs: 7.4 10*3/uL (ref 1.7–7.7)
Neutrophils Relative %: 68 % (ref 43–77)
Platelets: 372 10*3/uL (ref 150–400)
RBC: 4.2 MIL/uL — ABNORMAL LOW (ref 4.22–5.81)
RDW: 13.9 % (ref 11.5–15.5)
WBC: 10.9 10*3/uL — ABNORMAL HIGH (ref 4.0–10.5)

## 2012-01-07 LAB — BASIC METABOLIC PANEL
BUN: 23 mg/dL (ref 6–23)
CO2: 29 mEq/L (ref 19–32)
Calcium: 10 mg/dL (ref 8.4–10.5)
Chloride: 102 mEq/L (ref 96–112)
Creat: 0.91 mg/dL (ref 0.50–1.35)
Glucose, Bld: 159 mg/dL — ABNORMAL HIGH (ref 70–99)
Potassium: 5.4 mEq/L — ABNORMAL HIGH (ref 3.5–5.3)
Sodium: 139 mEq/L (ref 135–145)

## 2012-01-07 LAB — LIPID PANEL
Cholesterol: 105 mg/dL (ref 0–200)
HDL: 27 mg/dL — ABNORMAL LOW (ref >39)
LDL Cholesterol: 66 mg/dL (ref 0–99)
Total CHOL/HDL Ratio: 3.9 Ratio
Triglycerides: 58 mg/dL (ref <150)
VLDL: 12 mg/dL (ref 0–40)

## 2012-01-07 LAB — HEPATIC FUNCTION PANEL
ALT: 14 U/L (ref 0–53)
AST: 18 U/L (ref 0–37)
Albumin: 4.5 g/dL (ref 3.5–5.2)
Alkaline Phosphatase: 100 U/L (ref 39–117)
Bilirubin, Direct: 0.1 mg/dL (ref 0.0–0.3)
Indirect Bilirubin: 0.2 mg/dL (ref 0.0–0.9)
Total Bilirubin: 0.3 mg/dL (ref 0.3–1.2)
Total Protein: 6.8 g/dL (ref 6.0–8.3)

## 2012-01-07 LAB — HEMOGLOBIN A1C
Hgb A1c MFr Bld: 6.6 % — ABNORMAL HIGH (ref <5.7)
Mean Plasma Glucose: 143 mg/dL — ABNORMAL HIGH (ref <117)

## 2012-01-08 LAB — MICROALBUMIN / CREATININE URINE RATIO
Creatinine, Urine: 64.6 mg/dL
Microalb Creat Ratio: 129.6 mg/g — ABNORMAL HIGH (ref 0.0–30.0)
Microalb, Ur: 8.37 mg/dL — ABNORMAL HIGH (ref 0.00–1.89)

## 2012-01-11 ENCOUNTER — Ambulatory Visit (INDEPENDENT_AMBULATORY_CARE_PROVIDER_SITE_OTHER): Payer: Federal, State, Local not specified - PPO | Admitting: Internal Medicine

## 2012-01-11 ENCOUNTER — Encounter: Payer: Self-pay | Admitting: Internal Medicine

## 2012-01-11 ENCOUNTER — Telehealth: Payer: Self-pay | Admitting: Internal Medicine

## 2012-01-11 DIAGNOSIS — F17200 Nicotine dependence, unspecified, uncomplicated: Secondary | ICD-10-CM

## 2012-01-11 DIAGNOSIS — E785 Hyperlipidemia, unspecified: Secondary | ICD-10-CM

## 2012-01-11 DIAGNOSIS — E119 Type 2 diabetes mellitus without complications: Secondary | ICD-10-CM

## 2012-01-11 DIAGNOSIS — I10 Essential (primary) hypertension: Secondary | ICD-10-CM

## 2012-01-11 MED ORDER — INSULIN GLARGINE 100 UNIT/ML ~~LOC~~ SOLN
20.0000 [IU] | Freq: Every day | SUBCUTANEOUS | Status: DC
Start: 2012-01-11 — End: 2012-01-12

## 2012-01-11 MED ORDER — GLIMEPIRIDE 4 MG PO TABS: 4.0000 mg | ORAL_TABLET | Freq: Every day | ORAL | Status: DC

## 2012-01-11 MED ORDER — PREGABALIN 100 MG PO CAPS: 75.0000 mg | ORAL_CAPSULE | Freq: Two times a day (BID) | ORAL | Status: DC

## 2012-01-11 MED ORDER — LABETALOL HCL 200 MG PO TABS: 200.0000 mg | ORAL_TABLET | Freq: Two times a day (BID) | ORAL | Status: DC

## 2012-01-11 MED ORDER — AMLODIPINE BESYLATE 10 MG PO TABS: 10.0000 mg | ORAL_TABLET | Freq: Every day | ORAL | Status: DC

## 2012-01-11 MED ORDER — METFORMIN HCL 1000 MG PO TABS: 1000.0000 mg | ORAL_TABLET | Freq: Two times a day (BID) | ORAL | Status: DC

## 2012-01-11 MED ORDER — ATORVASTATIN CALCIUM 20 MG PO TABS: 20.0000 mg | ORAL_TABLET | Freq: Every day | ORAL | Status: DC

## 2012-01-11 NOTE — Assessment & Plan Note (Signed)
Improving control. Continue current regimen. Begin regular exercise. Obtain chem7, a1c urine microalbumin prior to next visit

## 2012-01-11 NOTE — Telephone Encounter (Signed)
Lab order entered for June 2013. 

## 2012-01-11 NOTE — Assessment & Plan Note (Signed)
Encouraged in cessation efforts 

## 2012-01-11 NOTE — Assessment & Plan Note (Signed)
Improved. At goal. Continue statin tx.

## 2012-01-11 NOTE — Assessment & Plan Note (Signed)
Normotensive and stable. Continue current regimen. Monitor bp as outpt and followup in clinic as scheduled.  

## 2012-01-11 NOTE — Progress Notes (Signed)
  Subjective:    Patient ID: Gene [name], male    DOB: Sep 06, 1956, 56 y.o.   MRN: [id number]  HPI Pt presents to clinic for followup of multiple medical problems. Tolerating statin tx without myalgia or abn lft. ldl reviewed now at goal with lipitor. a1c improving. States is planning to become tobacco cessation efforts and begin regular exercise. No active complaint.   Past Medical History  Diagnosis Date  . History of colonic polyps   . Diabetes mellitus type II   . GERD (gastroesophageal reflux disease)   . Hyperlipidemia   . Hypertension   . CAD (coronary artery disease)   . PVD (peripheral vascular disease)     PTA RLE 12/08 [place]  . Diabetic neuropathy   . Plantar fasciitis   . Chronic pain in right foot   . Alcohol abuse, episodic    Past Surgical History  Procedure Date  . Coronary artery bypass graft     200 SVG to diagonal, LIMA to LAD, SVG to LCX  . Other surgical history     PAD surgery  . Revision total knee arthroplasty     reports that he has been smoking.  He has never used smokeless tobacco. He reports that he uses illicit drugs. He reports that he does not drink alcohol. family history includes Coronary artery disease in his mother; Hyperlipidemia in an unspecified family member; Hypertension in an unspecified family member; and Stroke in an unspecified family member. No Known Allergies   Review of Systems see hpi     Objective:   Physical Exam  Physical Exam  Nursing note and vitals reviewed. Constitutional: Appears well-developed and well-nourished. No distress.  HENT:  Head: Normocephalic and atraumatic.  Right Ear: External ear normal.  Left Ear: External ear normal.  Eyes: Conjunctivae are normal. No scleral icterus.  Neck: Neck supple. Carotid bruit is not present.  Cardiovascular: Normal rate, regular rhythm and normal heart sounds.  Exam reveals no gallop and no friction rub.   No murmur heard. Pulmonary/Chest: Effort normal and  breath sounds normal. No respiratory distress. He has no wheezes. no rales.  Lymphadenopathy:    He has no cervical adenopathy.  Neurological:Alert.  Skin: Skin is warm and dry. Not diaphoretic.  Psychiatric: Has a normal mood and affect.        Assessment & Plan:

## 2012-01-11 NOTE — Patient Instructions (Signed)
Please schedule chem7, a1c, urine microalbumin 250.0 prior to next visit 

## 2012-01-12 ENCOUNTER — Telehealth: Payer: Self-pay | Admitting: *Deleted

## 2012-01-12 MED ORDER — INSULIN GLARGINE 100 UNIT/ML ~~LOC~~ SOLN: 20.0000 [IU] | Freq: Every day | SUBCUTANEOUS | Status: DC

## 2012-01-12 NOTE — Telephone Encounter (Signed)
Fax received from [facility] stating patient is requesting Lantus Pens instead of vials. Rx re-sent for Lantus Solostar pens.  Rx re-sent to pharmacy for Lantus Solostar pens.

## 2012-03-09 ENCOUNTER — Other Ambulatory Visit: Payer: Self-pay | Admitting: Internal Medicine

## 2012-03-10 NOTE — Telephone Encounter (Signed)
Rx refill sent to pharmacy. 

## 2012-04-19 ENCOUNTER — Ambulatory Visit: Payer: Federal, State, Local not specified - PPO | Admitting: Internal Medicine

## 2012-04-27 ENCOUNTER — Other Ambulatory Visit: Payer: Self-pay | Admitting: *Deleted

## 2012-04-27 DIAGNOSIS — E119 Type 2 diabetes mellitus without complications: Secondary | ICD-10-CM

## 2012-04-27 LAB — BASIC METABOLIC PANEL
BUN: 21 mg/dL (ref 6–23)
CO2: 28 mEq/L (ref 19–32)
Calcium: 9.4 mg/dL (ref 8.4–10.5)
Chloride: 105 mEq/L (ref 96–112)
Creat: 0.88 mg/dL (ref 0.50–1.35)
Glucose, Bld: 157 mg/dL — ABNORMAL HIGH (ref 70–99)
Potassium: 4.5 mEq/L (ref 3.5–5.3)
Sodium: 140 mEq/L (ref 135–145)

## 2012-04-27 LAB — HEMOGLOBIN A1C
Hgb A1c MFr Bld: 7.4 % — ABNORMAL HIGH (ref <5.7)
Mean Plasma Glucose: 166 mg/dL — ABNORMAL HIGH (ref <117)

## 2012-04-28 ENCOUNTER — Encounter: Payer: Self-pay | Admitting: Internal Medicine

## 2012-04-28 ENCOUNTER — Ambulatory Visit (INDEPENDENT_AMBULATORY_CARE_PROVIDER_SITE_OTHER): Payer: Federal, State, Local not specified - PPO | Admitting: Internal Medicine

## 2012-04-28 ENCOUNTER — Telehealth: Payer: Self-pay | Admitting: Internal Medicine

## 2012-04-28 VITALS — BP 110/78 | HR 74 | Temp 97.4°F | Resp 18 | Ht 72.0 in | Wt 204.0 lb

## 2012-04-28 DIAGNOSIS — E119 Type 2 diabetes mellitus without complications: Secondary | ICD-10-CM

## 2012-04-28 DIAGNOSIS — E1149 Type 2 diabetes mellitus with other diabetic neurological complication: Secondary | ICD-10-CM

## 2012-04-28 DIAGNOSIS — E1142 Type 2 diabetes mellitus with diabetic polyneuropathy: Secondary | ICD-10-CM

## 2012-04-28 DIAGNOSIS — E785 Hyperlipidemia, unspecified: Secondary | ICD-10-CM

## 2012-04-28 LAB — MICROALBUMIN / CREATININE URINE RATIO
Creatinine, Urine: 65.2 mg/dL
Microalb Creat Ratio: 207.1 mg/g — ABNORMAL HIGH (ref 0.0–30.0)
Microalb, Ur: 13.5 mg/dL — ABNORMAL HIGH (ref 0.00–1.89)

## 2012-04-28 MED ORDER — GABAPENTIN 600 MG PO TABS: 600.0000 mg | ORAL_TABLET | Freq: Two times a day (BID) | ORAL | Status: DC

## 2012-04-28 NOTE — Patient Instructions (Signed)
Please schedule fasting labs prior to next visit Chem7, a1c, urine microalbumin-250 and lipid/lft 272.4

## 2012-04-28 NOTE — Progress Notes (Signed)
  Subjective:    Patient ID: Gene [name], male    DOB: Dec 16, 1955, 56 y.o.   MRN: [id number]  HPI Pt presents to clinic for followup of multiple medical problems. a1c elevated- states quit smoking for one month and had dietary indiscretions. Urine microalbumin increasing. BP reviewed normotensive. lyrica too expensive for neuropathy.   Past Medical History  Diagnosis Date  . History of colonic polyps   . Diabetes mellitus type II   . GERD (gastroesophageal reflux disease)   . Hyperlipidemia   . Hypertension   . CAD (coronary artery disease)   . PVD (peripheral vascular disease)     PTA RLE 12/08 [place]  . Diabetic neuropathy   . Plantar fasciitis   . Chronic pain in right foot   . Alcohol abuse, episodic    Past Surgical History  Procedure Date  . Coronary artery bypass graft     200 SVG to diagonal, LIMA to LAD, SVG to LCX  . Other surgical history     PAD surgery  . Revision total knee arthroplasty     reports that he has been smoking.  He has never used smokeless tobacco. He reports that he uses illicit drugs. He reports that he does not drink alcohol. family history includes Coronary artery disease in his mother; Hyperlipidemia in an unspecified family member; Hypertension in an unspecified family member; and Stroke in an unspecified family member. No Known Allergies    Review of Systems see hpi     Objective:   Physical Exam  Physical Exam  Nursing note and vitals reviewed. Constitutional: Appears well-developed and well-nourished. No distress.  HENT:  Head: Normocephalic and atraumatic.  Right Ear: External ear normal.  Left Ear: External ear normal.  Eyes: Conjunctivae are normal. No scleral icterus.  Neck: Neck supple. Carotid bruit is not present.  Cardiovascular: Normal rate, regular rhythm and normal heart sounds.  Exam reveals no gallop and no friction rub.   No murmur heard. Pulmonary/Chest: Effort normal and breath sounds normal. No respiratory  distress. He has no wheezes. no rales.  Lymphadenopathy:    He has no cervical adenopathy.  Neurological:Alert.  Skin: Skin is warm and dry. Not diaphoretic.  Psychiatric: Has a normal mood and affect.        Assessment & Plan:

## 2012-04-28 NOTE — Telephone Encounter (Signed)
Lab orders entered for September 2013. 

## 2012-05-13 NOTE — Assessment & Plan Note (Signed)
Change lyrica to neurontin.

## 2012-05-13 NOTE — Assessment & Plan Note (Signed)
Worsening a1c with dietary indiscretion. Focus on appropriate diet.

## 2012-05-15 ENCOUNTER — Telehealth: Payer: Self-pay | Admitting: Internal Medicine

## 2012-05-15 NOTE — Telephone Encounter (Signed)
Done/SLS 

## 2012-05-15 NOTE — Telephone Encounter (Signed)
Refill- lantus solostar inj. Inject 15 to 30 units subcutaneously every day. Qty 15 last fill 2.26.13

## 2012-05-16 MED ORDER — INSULIN GLARGINE 100 UNIT/ML ~~LOC~~ SOLN: 20.0000 [IU] | Freq: Every day | SUBCUTANEOUS | Status: DC

## 2012-05-16 NOTE — Telephone Encounter (Signed)
Patient called back reporting that he uses Lantus Solostar Pen insulin; sent to pharmacy/SLS

## 2012-05-23 ENCOUNTER — Telehealth: Payer: Self-pay | Admitting: Internal Medicine

## 2012-05-23 ENCOUNTER — Other Ambulatory Visit: Payer: Self-pay | Admitting: Internal Medicine

## 2012-05-23 DIAGNOSIS — E119 Type 2 diabetes mellitus without complications: Secondary | ICD-10-CM

## 2012-05-23 MED ORDER — GLUCOSE BLOOD VI DISK: 1.0000 | DISK | Freq: Three times a day (TID) | Status: DC

## 2012-05-23 NOTE — Telephone Encounter (Signed)
Taking 600mg  bid currently which is 1200mg . If he is talking about an increase from that then 900mg  bid or 600mg  tid.

## 2012-05-23 NOTE — Telephone Encounter (Signed)
See phone note 05/23/12.

## 2012-05-23 NOTE — Telephone Encounter (Signed)
Refill sent to pharmacy #[id number] x 3 refills.

## 2012-05-23 NOTE — Telephone Encounter (Signed)
Refill- breeze 2 test disk. Use one test strip three times daily. Qty 100 last fill 5.11.12

## 2012-05-23 NOTE — Telephone Encounter (Signed)
Patient states that he needs a refill of gabapentin. He says that he has talked to Dr. Rodena Medin about increasing the dosage and would like to go up to 1200mg . [facility] [address].

## 2012-05-23 NOTE — Telephone Encounter (Signed)
04-28-12 Last OV,Last filled #[phone]

## 2012-05-24 NOTE — Telephone Encounter (Signed)
Left message to call office

## 2012-05-25 NOTE — Telephone Encounter (Signed)
Left message on home # to return my call. 

## 2012-05-29 NOTE — Telephone Encounter (Signed)
Left detailed message on home# to call re: gabapentin clarification.

## 2012-05-30 NOTE — Telephone Encounter (Signed)
Pt returned my call, states he has been taking 600mg  three times daily and doesn't feel it is helping. Wants to know if he can increase further and if so, how much?

## 2012-05-30 NOTE — Telephone Encounter (Signed)
1800mg  a day is high dose for nerve tx. Higher dosages than that usually reserved for seizure treatment. Alternative would be lyrica 75mg  tid

## 2012-05-30 NOTE — Telephone Encounter (Signed)
Notified pt and he states he will continue the 1800mg  dose as gabapentin is much cheaper for him than Lyrica.

## 2012-08-03 ENCOUNTER — Ambulatory Visit: Payer: Federal, State, Local not specified - PPO | Admitting: Internal Medicine

## 2012-08-09 LAB — HEPATIC FUNCTION PANEL
ALT: 16 U/L (ref 0–53)
AST: 17 U/L (ref 0–37)
Albumin: 4.2 g/dL (ref 3.5–5.2)
Alkaline Phosphatase: 114 U/L (ref 39–117)
Bilirubin, Direct: 0.1 mg/dL (ref 0.0–0.3)
Indirect Bilirubin: 0.2 mg/dL (ref 0.0–0.9)
Total Bilirubin: 0.3 mg/dL (ref 0.3–1.2)
Total Protein: 6.8 g/dL (ref 6.0–8.3)

## 2012-08-09 LAB — BASIC METABOLIC PANEL
BUN: 20 mg/dL (ref 6–23)
CO2: 30 mEq/L (ref 19–32)
Calcium: 9.9 mg/dL (ref 8.4–10.5)
Chloride: 104 mEq/L (ref 96–112)
Creat: 0.91 mg/dL (ref 0.50–1.35)
Glucose, Bld: 118 mg/dL — ABNORMAL HIGH (ref 70–99)
Potassium: 4.7 mEq/L (ref 3.5–5.3)
Sodium: 141 mEq/L (ref 135–145)

## 2012-08-09 LAB — LIPID PANEL
Cholesterol: 142 mg/dL (ref 0–200)
HDL: 27 mg/dL — ABNORMAL LOW (ref >39)
LDL Cholesterol: 99 mg/dL (ref 0–99)
Total CHOL/HDL Ratio: 5.3 Ratio
Triglycerides: 82 mg/dL (ref <150)
VLDL: 16 mg/dL (ref 0–40)

## 2012-08-09 LAB — HEMOGLOBIN A1C
Hgb A1c MFr Bld: 7.6 % — ABNORMAL HIGH (ref <5.7)
Mean Plasma Glucose: 171 mg/dL — ABNORMAL HIGH (ref <117)

## 2012-08-09 NOTE — Telephone Encounter (Signed)
Pt presented to the lab, order released. 

## 2012-08-09 NOTE — Addendum Note (Signed)
Addended by: Mervin Kung A on: 08/09/2012 11:20 AM   Modules accepted: Orders

## 2012-08-10 LAB — MICROALBUMIN / CREATININE URINE RATIO
Creatinine, Urine: 45.1 mg/dL
Microalb Creat Ratio: 177.2 mg/g — ABNORMAL HIGH (ref 0.0–30.0)
Microalb, Ur: 7.99 mg/dL — ABNORMAL HIGH (ref 0.00–1.89)

## 2012-08-11 ENCOUNTER — Encounter: Payer: Self-pay | Admitting: Internal Medicine

## 2012-08-11 ENCOUNTER — Ambulatory Visit (INDEPENDENT_AMBULATORY_CARE_PROVIDER_SITE_OTHER): Payer: Federal, State, Local not specified - PPO | Admitting: Internal Medicine

## 2012-08-11 VITALS — BP 136/78 | HR 75 | Temp 98.2°F | Resp 16 | Wt 209.0 lb

## 2012-08-11 DIAGNOSIS — E119 Type 2 diabetes mellitus without complications: Secondary | ICD-10-CM

## 2012-08-11 DIAGNOSIS — E785 Hyperlipidemia, unspecified: Secondary | ICD-10-CM

## 2012-08-11 DIAGNOSIS — Z23 Encounter for immunization: Secondary | ICD-10-CM

## 2012-08-11 NOTE — Patient Instructions (Signed)
Please schedule fasting labs prior to next visit Cbc, chem7, a1c-250.0 and lipid/lft-272.4

## 2012-08-13 NOTE — Progress Notes (Signed)
  Subjective:    Patient ID: Gene [name], male    DOB: 1956/01/27, 56 y.o.   MRN: [id number]  HPI Pt presents to clinic for followup of multiple medical problems. A1c elevated however notes recent blood sugars between fifty-eight and iron forty-seven. Was out of insulin for one month. Weight up 5 pounds since last visit. Has been attempting dietary modification and exercise. Stopped statin for one half months. No myalgias or other side effects.  Past Medical History  Diagnosis Date  . History of colonic polyps   . Diabetes mellitus type II   . GERD (gastroesophageal reflux disease)   . Hyperlipidemia   . Hypertension   . CAD (coronary artery disease)   . PVD (peripheral vascular disease)     PTA RLE 12/08 [place]  . Diabetic neuropathy   . Plantar fasciitis   . Chronic pain in right foot   . Alcohol abuse, episodic    Past Surgical History  Procedure Date  . Coronary artery bypass graft     200 SVG to diagonal, LIMA to LAD, SVG to LCX  . Other surgical history     PAD surgery  . Revision total knee arthroplasty     reports that he has been smoking.  He has never used smokeless tobacco. He reports that he uses illicit drugs. He reports that he does not drink alcohol. family history includes Coronary artery disease in his mother; Hyperlipidemia in an unspecified family member; Hypertension in an unspecified family member; and Stroke in an unspecified family member. No Known Allergies    Review of Systems see hpi     Objective:   Physical Exam  Physical Exam  Nursing note and vitals reviewed. Constitutional: Appears well-developed and well-nourished. No distress.  HENT:  Head: Normocephalic and atraumatic.  Right Ear: External ear normal.  Left Ear: External ear normal.  Eyes: Conjunctivae are normal. No scleral icterus.  Neck: Neck supple. Carotid bruit is not present.  Cardiovascular: Normal rate, regular rhythm and normal heart sounds.  Exam reveals no gallop  and no friction rub.   No murmur heard. Pulmonary/Chest: Effort normal and breath sounds normal. No respiratory distress. He has no wheezes. no rales.  Lymphadenopathy:    He has no cervical adenopathy.  Neurological:Alert.  Skin: Skin is warm and dry. Not diaphoretic.  Psychiatric: Has a normal mood and affect.        Assessment & Plan:

## 2012-08-13 NOTE — Assessment & Plan Note (Signed)
Improved blood sugars based on fingerstick. A1c elevated but may not reflect current control. Continue current regimen and repeat A1c with next visit. Urine microalbumin elevated but may improve with improved glycemic control. If persisst discuss ACE inhibitor adjustment

## 2012-08-13 NOTE — Assessment & Plan Note (Signed)
Off statin. Strongly encouraged to resume given history of diabetes and CAD.

## 2012-08-18 ENCOUNTER — Telehealth: Payer: Self-pay | Admitting: Internal Medicine

## 2012-08-18 ENCOUNTER — Other Ambulatory Visit: Payer: Self-pay | Admitting: Internal Medicine

## 2012-08-18 MED ORDER — LISINOPRIL 40 MG PO TABS: 40.0000 mg | ORAL_TABLET | Freq: Every day | ORAL | Status: DC

## 2012-08-18 NOTE — Telephone Encounter (Signed)
Rx done/SLS 

## 2012-08-18 NOTE — Telephone Encounter (Signed)
Refill- lisinopril 40mg  tab. Take one tabletby mouth every day. Qty 30 last fill 9.9.13

## 2012-09-21 ENCOUNTER — Other Ambulatory Visit: Payer: Self-pay | Admitting: *Deleted

## 2012-09-21 MED ORDER — AMLODIPINE BESYLATE 10 MG PO TABS: 10.0000 mg | ORAL_TABLET | Freq: Every day | ORAL | Status: DC

## 2012-09-21 NOTE — Telephone Encounter (Signed)
Rx to pharmacy/SLS 

## 2012-10-31 ENCOUNTER — Other Ambulatory Visit: Payer: Self-pay | Admitting: Internal Medicine

## 2012-10-31 ENCOUNTER — Telehealth: Payer: Self-pay | Admitting: Internal Medicine

## 2012-10-31 DIAGNOSIS — E119 Type 2 diabetes mellitus without complications: Secondary | ICD-10-CM

## 2012-10-31 DIAGNOSIS — E785 Hyperlipidemia, unspecified: Secondary | ICD-10-CM

## 2012-10-31 LAB — CBC WITH DIFFERENTIAL/PLATELET
Basophils Absolute: 0 10*3/uL (ref 0.0–0.1)
Basophils Relative: 1 % (ref 0–1)
Eosinophils Absolute: 0.2 10*3/uL (ref 0.0–0.7)
Eosinophils Relative: 3 % (ref 0–5)
HCT: 35.3 % — ABNORMAL LOW (ref 39.0–52.0)
Hemoglobin: 11.9 g/dL — ABNORMAL LOW (ref 13.0–17.0)
Lymphocytes Relative: 30 % (ref 12–46)
Lymphs Abs: 2.1 10*3/uL (ref 0.7–4.0)
MCH: 29.8 pg (ref 26.0–34.0)
MCHC: 33.7 g/dL (ref 30.0–36.0)
MCV: 88.5 fL (ref 78.0–100.0)
Monocytes Absolute: 0.5 10*3/uL (ref 0.1–1.0)
Monocytes Relative: 7 % (ref 3–12)
Neutro Abs: 4.2 10*3/uL (ref 1.7–7.7)
Neutrophils Relative %: 59 % (ref 43–77)
Platelets: 389 10*3/uL (ref 150–400)
RBC: 3.99 MIL/uL — ABNORMAL LOW (ref 4.22–5.81)
RDW: 13.9 % (ref 11.5–15.5)
WBC: 7.1 10*3/uL (ref 4.0–10.5)

## 2012-10-31 LAB — BASIC METABOLIC PANEL
BUN: 17 mg/dL (ref 6–23)
CO2: 29 mEq/L (ref 19–32)
Calcium: 9.4 mg/dL (ref 8.4–10.5)
Chloride: 104 mEq/L (ref 96–112)
Creat: 0.89 mg/dL (ref 0.50–1.35)
Glucose, Bld: 92 mg/dL (ref 70–99)
Potassium: 4.6 mEq/L (ref 3.5–5.3)
Sodium: 140 mEq/L (ref 135–145)

## 2012-10-31 LAB — HEPATIC FUNCTION PANEL
ALT: 16 U/L (ref 0–53)
AST: 21 U/L (ref 0–37)
Albumin: 4.1 g/dL (ref 3.5–5.2)
Alkaline Phosphatase: 113 U/L (ref 39–117)
Bilirubin, Direct: 0.1 mg/dL (ref 0.0–0.3)
Indirect Bilirubin: 0.3 mg/dL (ref 0.0–0.9)
Total Bilirubin: 0.4 mg/dL (ref 0.3–1.2)
Total Protein: 6.6 g/dL (ref 6.0–8.3)

## 2012-10-31 LAB — LIPID PANEL
Cholesterol: 136 mg/dL (ref 0–200)
HDL: 32 mg/dL — ABNORMAL LOW (ref >39)
LDL Cholesterol: 93 mg/dL (ref 0–99)
Total CHOL/HDL Ratio: 4.3 Ratio
Triglycerides: 55 mg/dL (ref <150)
VLDL: 11 mg/dL (ref 0–40)

## 2012-10-31 LAB — HEMOGLOBIN A1C
Hgb A1c MFr Bld: 6.9 % — ABNORMAL HIGH (ref <5.7)
Mean Plasma Glucose: 151 mg/dL — ABNORMAL HIGH (ref <117)

## 2012-10-31 MED ORDER — ONETOUCH VERIO IQ SYSTEM W/DEVICE KIT: 1.0000 | PACK | Status: DC

## 2012-10-31 MED ORDER — GLUCOSE BLOOD VI STRP: ORAL_STRIP | Status: DC

## 2012-10-31 NOTE — Progress Notes (Signed)
Pt presented to the lab. Future orders entered per 08/11/12 office note as below:  Please schedule fasting labs prior to next visit  Cbc, chem7, a1c-250.0 and lipid/lft-272.4

## 2012-10-31 NOTE — Telephone Encounter (Signed)
Printed Rx scripts for provider signature; [facility] with contact name & number for return call to clarify if Rx needs to be p/u at office and/or if [facility] to fax scripts/SLS

## 2012-10-31 NOTE — Telephone Encounter (Signed)
He would like a written rx for Verio iq meter and strips for this meter.

## 2012-11-01 NOTE — Telephone Encounter (Signed)
Printed script[s] forwarded to front desk for pt pick-up/SLS

## 2012-11-01 NOTE — Telephone Encounter (Signed)
Patient states that he will pickup Rx at his office visit tomorrow

## 2012-11-02 ENCOUNTER — Encounter: Payer: Self-pay | Admitting: Internal Medicine

## 2012-11-02 ENCOUNTER — Ambulatory Visit (INDEPENDENT_AMBULATORY_CARE_PROVIDER_SITE_OTHER): Payer: Federal, State, Local not specified - PPO | Admitting: Internal Medicine

## 2012-11-02 VITALS — BP 126/74 | HR 69 | Temp 98.2°F | Resp 16 | Wt 211.2 lb

## 2012-11-02 DIAGNOSIS — E119 Type 2 diabetes mellitus without complications: Secondary | ICD-10-CM

## 2012-11-02 DIAGNOSIS — D649 Anemia, unspecified: Secondary | ICD-10-CM

## 2012-11-02 NOTE — Patient Instructions (Signed)
Please schedule labs prior to next visit Cbc, ferritin, b12-anemia, chem7, a1c-250.00

## 2012-11-03 ENCOUNTER — Other Ambulatory Visit: Payer: Self-pay | Admitting: Internal Medicine

## 2012-11-06 ENCOUNTER — Ambulatory Visit: Payer: Federal, State, Local not specified - PPO | Admitting: Internal Medicine

## 2012-11-06 ENCOUNTER — Other Ambulatory Visit: Payer: Self-pay | Admitting: *Deleted

## 2012-11-06 MED ORDER — ONETOUCH LANCETS MISC: Status: DC

## 2012-11-06 NOTE — Telephone Encounter (Signed)
Rx to pharmacy/SLS 

## 2012-11-10 DIAGNOSIS — D649 Anemia, unspecified: Secondary | ICD-10-CM | POA: Insufficient documentation

## 2012-11-10 NOTE — Assessment & Plan Note (Signed)
Minimal. Asx. Given hemoccult cards x3. Obtain repeat cbc with next lab draw.

## 2012-11-10 NOTE — Assessment & Plan Note (Signed)
Improving control.  Continue current regimen. 

## 2012-11-10 NOTE — Progress Notes (Signed)
  Subjective:    Patient ID: Gene [name], male    DOB: 1956/01/08, 56 y.o.   MRN: [id number]  HPI Pt presents to clinic for followup of multiple medical problems. Reviewed improving a1c. Diabetic eye exam utd. BP reviewed normotensive. Is off statin and ldl reviewed less than 100.   Past Medical History  Diagnosis Date  . History of colonic polyps   . Diabetes mellitus type II   . GERD (gastroesophageal reflux disease)   . Hyperlipidemia   . Hypertension   . CAD (coronary artery disease)   . PVD (peripheral vascular disease)     PTA RLE 12/08 [place]  . Diabetic neuropathy   . Plantar fasciitis   . Chronic pain in right foot   . Alcohol abuse, episodic    Past Surgical History  Procedure Date  . Coronary artery bypass graft     200 SVG to diagonal, LIMA to LAD, SVG to LCX  . Other surgical history     PAD surgery  . Revision total knee arthroplasty     reports that he has been smoking.  He has never used smokeless tobacco. He reports that he uses illicit drugs. He reports that he does not drink alcohol. family history includes Coronary artery disease in his mother; Hyperlipidemia in an unspecified family member; Hypertension in an unspecified family member; and Stroke in an unspecified family member. No Known Allergies    Review of Systems  Gastrointestinal: Negative for abdominal pain and blood in stool.   see hpi     Objective:   Physical Exam  Physical Exam  Nursing note and vitals reviewed. Constitutional: Appears well-developed and well-nourished. No distress.  HENT:  Head: Normocephalic and atraumatic.  Right Ear: External ear normal.  Left Ear: External ear normal.  Eyes: Conjunctivae are normal. No scleral icterus.  Neck: Neck supple. Carotid bruit is not present.  Cardiovascular: Normal rate, regular rhythm and normal heart sounds.  Exam reveals no gallop and no friction rub.   No murmur heard. Pulmonary/Chest: Effort normal and breath sounds  normal. No respiratory distress. He has no wheezes. no rales.  Lymphadenopathy:    He has no cervical adenopathy.  Neurological:Alert.  Skin: Skin is warm and dry. Not diaphoretic.  Psychiatric: Has a normal mood and affect.        Assessment & Plan:

## 2012-11-13 ENCOUNTER — Telehealth: Payer: Self-pay | Admitting: *Deleted

## 2012-11-13 LAB — HEMOCCULT SLIDES (X 3 CARDS)
OCCULT 1: POSITIVE — AB
OCCULT 2: POSITIVE — AB
OCCULT 3: POSITIVE — AB

## 2012-11-13 NOTE — Telephone Encounter (Signed)
Message [staff]    Hemoccult cards +for blood and hgb is lower. Recommend GI consult   LMOM with contact name & number for return call RE: results & further provider recommendations/SLS

## 2012-11-13 NOTE — Addendum Note (Signed)
Addended by: Regis Bill on: 11/13/2012 04:26 PM   Modules accepted: Orders

## 2012-11-14 ENCOUNTER — Other Ambulatory Visit: Payer: Self-pay | Admitting: Internal Medicine

## 2012-11-14 DIAGNOSIS — R195 Other fecal abnormalities: Secondary | ICD-10-CM

## 2012-11-14 DIAGNOSIS — D649 Anemia, unspecified: Secondary | ICD-10-CM

## 2012-11-14 NOTE — Telephone Encounter (Signed)
Referral order placed.

## 2012-11-14 NOTE — Telephone Encounter (Signed)
Patient informed, understood & agreed to proceed w/GI consults/SLS

## 2012-11-16 ENCOUNTER — Telehealth: Payer: Self-pay | Admitting: Internal Medicine

## 2012-11-16 ENCOUNTER — Encounter: Payer: Self-pay | Admitting: Gastroenterology

## 2012-11-16 MED ORDER — GABAPENTIN 600 MG PO TABS: 600.0000 mg | ORAL_TABLET | Freq: Three times a day (TID) | ORAL | Status: DC

## 2012-11-16 NOTE — Telephone Encounter (Signed)
Rx to pharmacy/SLS 

## 2012-11-16 NOTE — Telephone Encounter (Signed)
Refill- gabapentin 600mg  tab. Take one tablet twice daily. Qty 60 last fill 12.3.13

## 2012-11-16 NOTE — Telephone Encounter (Signed)
Ok rf 5 

## 2012-11-29 ENCOUNTER — Encounter: Payer: Self-pay | Admitting: Gastroenterology

## 2012-11-29 ENCOUNTER — Ambulatory Visit (INDEPENDENT_AMBULATORY_CARE_PROVIDER_SITE_OTHER): Payer: Federal, State, Local not specified - PPO | Admitting: Gastroenterology

## 2012-11-29 ENCOUNTER — Telehealth: Payer: Self-pay

## 2012-11-29 ENCOUNTER — Other Ambulatory Visit (INDEPENDENT_AMBULATORY_CARE_PROVIDER_SITE_OTHER): Payer: Federal, State, Local not specified - PPO

## 2012-11-29 VITALS — BP 126/64 | HR 80 | Ht 72.0 in | Wt 211.6 lb

## 2012-11-29 DIAGNOSIS — R195 Other fecal abnormalities: Secondary | ICD-10-CM

## 2012-11-29 DIAGNOSIS — D649 Anemia, unspecified: Secondary | ICD-10-CM

## 2012-11-29 DIAGNOSIS — K219 Gastro-esophageal reflux disease without esophagitis: Secondary | ICD-10-CM

## 2012-11-29 LAB — VITAMIN B12: Vitamin B-12: 386 pg/mL (ref 211–911)

## 2012-11-29 LAB — FOLATE: Folate: >24.8 ng/mL (ref >5.9)

## 2012-11-29 LAB — FERRITIN: Ferritin: 16.5 ng/mL — ABNORMAL LOW (ref 22.0–322.0)

## 2012-11-29 LAB — IBC PANEL
Iron: 62 ug/dL (ref 42–165)
Saturation Ratios: 17.6 % — ABNORMAL LOW (ref 20.0–50.0)
Transferrin: 251.2 mg/dL (ref 212.0–360.0)

## 2012-11-29 MED ORDER — PEG-KCL-NACL-NASULF-NA ASC-C 100 G PO SOLR: 1.0000 | Freq: Once | ORAL | Status: DC

## 2012-11-29 MED ORDER — FERROUS SULFATE 325 (65 FE) MG PO TABS: 325.0000 mg | ORAL_TABLET | Freq: Two times a day (BID) | ORAL | Status: DC

## 2012-11-29 NOTE — Telephone Encounter (Signed)
Message copied by Jessee Avers on Wed Nov 29, 2012  4:47 PM ------      Message from: Claudette Head T      Created: Wed Nov 29, 2012  2:37 PM       Fe def anemia      FeSO4 bid with meals for 3 months, hold 5 days before colonoscopy and then resume following colonoscopy

## 2012-11-29 NOTE — Progress Notes (Signed)
History of Present Illness: This is a 57 year old male referred for evaluation of Hemoccult-positive stool. Patient is accompanied by his wife. He states he had a colonoscopy performed in 2006 in [place] and colon polyps removed. He does not recall the type and is not aware of any recommendation for followup colonoscopy. He has had a mild anemia noted over the past few years. His most recent hemoglobin was 11.9. About 2 times per month he will have some mild acid reflux symptoms spicy foods that are promptly relieved with TUMS. Denies weight loss, abdominal pain, constipation, diarrhea, change in stool caliber, melena, hematochezia, nausea, vomiting, dysphagia,  chest pain.  Review of Systems: Pertinent positive and negative review of systems were noted in the above HPI section. All other review of systems were otherwise negative.  Current Medications, Allergies, Past Medical History, Past Surgical History, Family History and Social History were reviewed in [employer] record.  Physical Exam: General: Well developed , well nourished, no acute distress Head: Normocephalic and atraumatic Eyes:  sclerae anicteric, EOMI Ears: Normal auditory acuity Mouth: No deformity or lesions Neck: Supple, no masses or thyromegaly Lungs: Clear throughout to auscultation Heart: Regular rate and rhythm; no murmurs, rubs or bruits Abdomen: Soft, non tender and non distended. No masses, hepatosplenomegaly or hernias noted. Normal Bowel sounds Rectal: Deferred to colonoscopy Musculoskeletal: Symmetrical with no gross deformities  Skin: No lesions on visible extremities Pulses:  Normal pulses noted Extremities: No clubbing, cyanosis, edema or deformities noted Neurological: Alert oriented x 4, grossly nonfocal Cervical Nodes:  No significant cervical adenopathy Inguinal Nodes: No significant inguinal adenopathy Psychological:  Alert and cooperative. Normal mood and affect  Assessment and  Recommendations:  1. Hemoccult-positive stool. Rule out colorectal neoplasms, AVMs, hemorrhoids and other disorders. Schedule colonoscopy. The risks, benefits, and alternatives to colonoscopy with possible biopsy, possible polypectomy and possible injection of hemorrhoids were discussed with the patient and they consent to proceed.   2. Mild normocytic anemia. Obtain iron studies, B12 and folate.  3. Mild GERD. Antireflux measures and TUMS when necessary. If his symptoms become more frequent or severe consider upper endoscopy for further evaluation

## 2012-11-29 NOTE — Patient Instructions (Addendum)
Your physician has requested that you go to the basement for the following lab work before leaving today:Iron, TIBC, Ferritin, B12, and Folate.   You have been given a separate informational sheet regarding your tobacco use, the importance of quitting and local resources to help you quit.  You have been scheduled for a colonoscopy with propofol. Please follow written instructions given to you at your visit today.  Please pick up your prep kit at the pharmacy within the next 1-3 days. If you use inhalers (even only as needed) or a CPAP machine, please bring them with you on the day of your procedure.

## 2012-11-29 NOTE — Telephone Encounter (Signed)
Patient notified of lab results and ferrous sulfate sent to patient's pharmacy for patient to take for 3 months. Informed him to hold iron 5 days before his procedure and pt verbalized understanding.

## 2012-12-12 ENCOUNTER — Telehealth: Payer: Self-pay | Admitting: Internal Medicine

## 2012-12-12 MED ORDER — LABETALOL HCL 200 MG PO TABS: 200.0000 mg | ORAL_TABLET | Freq: Two times a day (BID) | ORAL | Status: DC

## 2012-12-12 NOTE — Telephone Encounter (Signed)
Refill- labetalol 200mg  tab. Take one tablet by mouth twice daily. Qty 180 last fill 11.8.13

## 2013-01-08 ENCOUNTER — Other Ambulatory Visit: Payer: Self-pay | Admitting: Internal Medicine

## 2013-01-09 ENCOUNTER — Encounter: Payer: Self-pay | Admitting: Gastroenterology

## 2013-01-09 ENCOUNTER — Other Ambulatory Visit: Payer: Self-pay | Admitting: Gastroenterology

## 2013-01-09 ENCOUNTER — Ambulatory Visit (AMBULATORY_SURGERY_CENTER): Payer: Federal, State, Local not specified - PPO | Admitting: Gastroenterology

## 2013-01-09 VITALS — BP 122/67 | HR 71 | Temp 97.1°F | Resp 13 | Ht 72.0 in | Wt 211.0 lb

## 2013-01-09 DIAGNOSIS — D126 Benign neoplasm of colon, unspecified: Secondary | ICD-10-CM

## 2013-01-09 DIAGNOSIS — Z86010 Personal history of colonic polyps: Secondary | ICD-10-CM

## 2013-01-09 DIAGNOSIS — D509 Iron deficiency anemia, unspecified: Secondary | ICD-10-CM

## 2013-01-09 DIAGNOSIS — R195 Other fecal abnormalities: Secondary | ICD-10-CM

## 2013-01-09 LAB — GLUCOSE, CAPILLARY
Glucose-Capillary: 132 mg/dL — ABNORMAL HIGH (ref 70–99)
Glucose-Capillary: 127 mg/dL — ABNORMAL HIGH (ref 70–99)

## 2013-01-09 LAB — HM COLONOSCOPY

## 2013-01-09 MED ORDER — SODIUM CHLORIDE 0.9 % IV SOLN: 500.0000 mL | INTRAVENOUS | Status: DC

## 2013-01-09 NOTE — Progress Notes (Signed)
Patient did not have preoperative order for IV antibiotic SSI prophylaxis. (G8918)  Patient did not experience any of the following events: a burn prior to discharge; a fall within the facility; wrong site/side/patient/procedure/implant event; or a hospital transfer or hospital admission upon discharge from the facility. (G8907)  

## 2013-01-09 NOTE — Progress Notes (Signed)
YOU HAD AN ENDOSCOPIC PROCEDURE TODAY AT THE [facility]: Refer to the procedure report that was given to you for any specific questions about what was found during the examination.  If the procedure report does not answer your questions, please call your gastroenterologist to clarify.  If you requested that your care partner not be given the details of your procedure findings, then the procedure report has been included in a sealed envelope for you to review at your convenience later.  YOU SHOULD EXPECT: Some feelings of bloating in the abdomen. Passage of more gas than usual.  Walking can help get rid of the air that was put into your GI tract during the procedure and reduce the bloating. If you had a lower endoscopy (such as a colonoscopy or flexible sigmoidoscopy) you may notice spotting of blood in your stool or on the toilet paper. If you underwent a bowel prep for your procedure, then you may not have a normal bowel movement for a few days.  DIET: Your first meal following the procedure should be a light meal and then it is ok to progress to your normal diet.  A half-sandwich or bowl of soup is an example of a good first meal.  Heavy or fried foods are harder to digest and may make you feel nauseous or bloated.  Likewise meals heavy in dairy and vegetables can cause extra gas to form and this can also increase the bloating.  Drink plenty of fluids but you should avoid alcoholic beverages for 24 hours.  ACTIVITY: Your care partner should take you home directly after the procedure.  You should plan to take it easy, moving slowly for the rest of the day.  You can resume normal activity the day after the procedure however you should NOT DRIVE or use heavy machinery for 24 hours (because of the sedation medicines used during the test).    SYMPTOMS TO REPORT IMMEDIATELY: A gastroenterologist can be reached at any hour.  During normal business hours, 8:30 AM to 5:00 PM Monday through Friday,  call [phone].  After hours and on weekends, please call the GI answering service at [phone] who will take a message and have the physician on call contact you.   Following lower endoscopy (colonoscopy or flexible sigmoidoscopy):  Excessive amounts of blood in the stool  Significant tenderness or worsening of abdominal pains  Swelling of the abdomen that is new, acute  Fever of 100F or higher    FOLLOW UP: If any biopsies were taken you will be contacted by phone or by letter within the next 1-3 weeks.  Call your gastroenterologist if you have not heard about the biopsies in 3 weeks.  Our staff will call the home number listed on your records the next business day following your procedure to check on you and address any questions or concerns that you may have at that time regarding the information given to you following your procedure. This is a courtesy call and so if there is no answer at the home number and we have not heard from you through the emergency physician on call, we will assume that you have returned to your regular daily activities without incident.  SIGNATURES/CONFIDENTIALITY: You and/or your care partner have signed paperwork which will be entered into your electronic medical record.  These signatures attest to the fact that that the information above on your After Visit Summary has been reviewed and is understood.  Full responsibility of the confidentiality   of this discharge information lies with you and/or your care-partner.   Information on polyps & hemorrhoids given to you today

## 2013-01-09 NOTE — Op Note (Signed)
[facility] [address] [place], [address]   COLONOSCOPY PROCEDURE REPORT PATIENT: [name], [name]  MR#: [id number] BIRTHDATE: 1956-10-12 , 56  yrs. old GENDER: Male ENDOSCOPIST: Meryl Dare, MD, [facility] REFERRED XB:[id number] Rodena Medin, M.D. PROCEDURE DATE:  01/09/2013 PROCEDURE:   Colonoscopy with snare polypectomy ASA CLASS:   Class II INDICATIONS:heme-positive stool, Iron Deficiency Anemia, and patient's personal history of colon polyps. MEDICATIONS: MAC sedation, administered by CRNA and propofol (Diprivan) 300mg  IV DESCRIPTION OF PROCEDURE:   After the risks benefits and alternatives of the procedure were thoroughly explained, informed consent was obtained.  A digital rectal exam revealed no abnormalities of the rectum.   The LB CF-H180AL [id number]  endoscope was introduced through the anus and advanced to the cecum, which was identified by both the appendix and ileocecal valve. No adverse events experienced.   The quality of the prep was adequate, using MoviPrep  The instrument was then slowly withdrawn as the colon was fully examined.  COLON FINDINGS: A sessile polyp measuring 1 cm in size was found at the cecum.  A polypectomy was performed using snare cautery.  The resection was complete and the polyp tissue was completely retrieved.   A sessile polyp measuring 6 mm in size was found in the transverse colon.  A polypectomy was performed with a cold snare.  The resection was complete and the polyp tissue was completely retrieved.   A sessile polyp measuring 7 mm in size was found in the sigmoid colon.  A polypectomy was performed with a cold snare.  The resection was complete and the polyp tissue was completely retrieved.   A semi-pedunculated polyp measuring 8 mm in size was found in the sigmoid colon.  A polypectomy was performed using snare cautery.  The resection was complete and the polyp tissue was completely retrieved.   The colon was otherwise  normal. There was no diverticulosis, inflammation, polyps or cancers unless previously stated.  Retroflexed views revealed internal hemorrhoids. The time to cecum=3 minutes 00 seconds.  Withdrawal time=15 minutes 56 seconds.  The scope was withdrawn and the procedure completed. COMPLICATIONS: There were no complications. ENDOSCOPIC IMPRESSION: 1.   Sessile polyp measuring 1 cm at the cecum; polypectomy performed using snare cautery 2.   Sessile polyp measuring 6 mm in the transverse colon; polypectomy performed with a cold snare 3.   Sessile polyp measuring 7 mm in the sigmoid colon; polypectomy performed with a cold snare 4.   Semi-pedunculated polyp measuring 8 mm in the sigmoid colon; polypectomy performed using snare cautery 5.   Internal hemorrhoids RECOMMENDATIONS: 1.  Hold aspirin, aspirin products, and anti-inflammatory medication for 2 weeks. 2.  Await pathology results 3.  Repeat colonoscopy in 3 years if polyp(s) adenomatous; otherwise 5 years with a more extensive bowel prep 4.  Upper endoscopy will be scheduled to further evaluate iron deficiency anemia  eSigned:  Meryl Dare, MD, [facility] 01/09/2013 10:03 AM      PATIENT NAME:  Gene [name], Gene [name] MR#: [id number]

## 2013-01-09 NOTE — Patient Instructions (Addendum)
YOU HAD AN ENDOSCOPIC PROCEDURE TODAY AT THE [facility]: Refer to the procedure report that was given to you for any specific questions about what was found during the examination.  If the procedure report does not answer your questions, please call your gastroenterologist to clarify.  If you requested that your care partner not be given the details of your procedure findings, then the procedure report has been included in a sealed envelope for you to review at your convenience later.  YOU SHOULD EXPECT: Some feelings of bloating in the abdomen. Passage of more gas than usual.  Walking can help get rid of the air that was put into your GI tract during the procedure and reduce the bloating. If you had a lower endoscopy (such as a colonoscopy or flexible sigmoidoscopy) you may notice spotting of blood in your stool or on the toilet paper. If you underwent a bowel prep for your procedure, then you may not have a normal bowel movement for a few days.  DIET: Your first meal following the procedure should be a light meal and then it is ok to progress to your normal diet.  A half-sandwich or bowl of soup is an example of a good first meal.  Heavy or fried foods are harder to digest and may make you feel nauseous or bloated.  Likewise meals heavy in dairy and vegetables can cause extra gas to form and this can also increase the bloating.  Drink plenty of fluids but you should avoid alcoholic beverages for 24 hours.  ACTIVITY: Your care partner should take you home directly after the procedure.  You should plan to take it easy, moving slowly for the rest of the day.  You can resume normal activity the day after the procedure however you should NOT DRIVE or use heavy machinery for 24 hours (because of the sedation medicines used during the test).    SYMPTOMS TO REPORT IMMEDIATELY: A gastroenterologist can be reached at any hour.  During normal business hours, 8:30 AM to 5:00 PM Monday through Friday,  call [phone].  After hours and on weekends, please call the GI answering service at [phone] who will take a message and have the physician on call contact you.   Following lower endoscopy (colonoscopy or flexible sigmoidoscopy):  Excessive amounts of blood in the stool  Significant tenderness or worsening of abdominal pains  Swelling of the abdomen that is new, acute  Fever of 100F or higher    FOLLOW UP: If any biopsies were taken you will be contacted by phone or by letter within the next 1-3 weeks.  Call your gastroenterologist if you have not heard about the biopsies in 3 weeks.  Our staff will call the home number listed on your records the next business day following your procedure to check on you and address any questions or concerns that you may have at that time regarding the information given to you following your procedure. This is a courtesy call and so if there is no answer at the home number and we have not heard from you through the emergency physician on call, we will assume that you have returned to your regular daily activities without incident.  SIGNATURES/CONFIDENTIALITY: You and/or your care partner have signed paperwork which will be entered into your electronic medical record.  These signatures attest to the fact that that the information above on your After Visit Summary has been reviewed and is understood.  Full responsibility of the confidentiality   of this discharge information lies with you and/or your care-partner.   Information on polyps & hemorrhoids given to you today  Upper endoscopy set up (see above )  Hold aspirin and anti inflammatory products x 2 weeks

## 2013-01-10 ENCOUNTER — Telehealth: Payer: Self-pay

## 2013-01-10 NOTE — Telephone Encounter (Signed)
Left a message at [phone] for the pt to call us back if he has questions or concerns. Maw

## 2013-01-15 ENCOUNTER — Ambulatory Visit (AMBULATORY_SURGERY_CENTER): Payer: Federal, State, Local not specified - PPO | Admitting: *Deleted

## 2013-01-15 VITALS — Ht 72.0 in | Wt 206.8 lb

## 2013-01-15 DIAGNOSIS — R195 Other fecal abnormalities: Secondary | ICD-10-CM

## 2013-01-15 DIAGNOSIS — D649 Anemia, unspecified: Secondary | ICD-10-CM

## 2013-01-15 NOTE — Progress Notes (Signed)
No egg or soy allergy, did well with propofol 01-09-13 colon. ewm  Pt states he has been holding his iron for 2 weeks due to colonoscopy. ewm

## 2013-01-16 ENCOUNTER — Encounter: Payer: Self-pay | Admitting: Gastroenterology

## 2013-01-16 ENCOUNTER — Other Ambulatory Visit: Payer: Self-pay | Admitting: Gastroenterology

## 2013-01-16 ENCOUNTER — Ambulatory Visit (AMBULATORY_SURGERY_CENTER): Payer: Federal, State, Local not specified - PPO | Admitting: Gastroenterology

## 2013-01-16 VITALS — BP 123/68 | HR 66 | Temp 97.3°F | Resp 16 | Ht 72.0 in | Wt 206.0 lb

## 2013-01-16 DIAGNOSIS — K2990 Gastroduodenitis, unspecified, without bleeding: Secondary | ICD-10-CM

## 2013-01-16 DIAGNOSIS — K219 Gastro-esophageal reflux disease without esophagitis: Secondary | ICD-10-CM

## 2013-01-16 DIAGNOSIS — K2970 Gastritis, unspecified, without bleeding: Secondary | ICD-10-CM

## 2013-01-16 DIAGNOSIS — D509 Iron deficiency anemia, unspecified: Secondary | ICD-10-CM

## 2013-01-16 DIAGNOSIS — Q2733 Arteriovenous malformation of digestive system vessel: Secondary | ICD-10-CM

## 2013-01-16 DIAGNOSIS — R195 Other fecal abnormalities: Secondary | ICD-10-CM

## 2013-01-16 DIAGNOSIS — K209 Esophagitis, unspecified without bleeding: Secondary | ICD-10-CM

## 2013-01-16 DIAGNOSIS — D649 Anemia, unspecified: Secondary | ICD-10-CM

## 2013-01-16 DIAGNOSIS — K31819 Angiodysplasia of stomach and duodenum without bleeding: Secondary | ICD-10-CM

## 2013-01-16 LAB — GLUCOSE, CAPILLARY
Glucose-Capillary: 119 mg/dL — ABNORMAL HIGH (ref 70–99)
Glucose-Capillary: 102 mg/dL — ABNORMAL HIGH (ref 70–99)

## 2013-01-16 MED ORDER — SODIUM CHLORIDE 0.9 % IV SOLN: 500.0000 mL | INTRAVENOUS | Status: DC

## 2013-01-16 MED ORDER — OMEPRAZOLE 40 MG PO CPDR: 40.0000 mg | DELAYED_RELEASE_CAPSULE | Freq: Every day | ORAL | Status: DC

## 2013-01-16 NOTE — Op Note (Signed)
[facility] [address] [place], [address]   ENDOSCOPY PROCEDURE REPORT  PATIENT: Gene [name], Gene [name]  MR#: [id number] BIRTHDATE: [birth year] , 57  yrs. old GENDER: Male ENDOSCOPIST: Meryl Dare, MD, [facility] REFERRED BY:  Charlynn Court, M.D. PROCEDURE DATE:  01/16/2013 PROCEDURE:  EGD w/ hot bx forceps/cautery and EGD w/ biopsy ASA CLASS:     Class II INDICATIONS:  Iron deficiency anemia.   Heme positive stool. MEDICATIONS: MAC sedation, administered by CRNA, propofol (Diprivan) 300mg  IV, lidocaine 20 mg IV TOPICAL ANESTHETIC: Cetacaine Spray DESCRIPTION OF PROCEDURE: After the risks benefits and alternatives of the procedure were thoroughly explained, informed consent was obtained.  The LB GIF-H180 [id number] endoscope was introduced through the mouth and advanced to the second portion of the duodenum without limitations.  The instrument was slowly withdrawn as the mucosa was fully examined.  ESOPHAGUS: There was LA Class A esophagitis noted.  The esophagus was otherwise normal. STOMACH: Erosive gastritis (inflammation) was found in the gastric antrum.  Multiple biopsies were performed. A small bleeding angiodysplastic lesion was found on the greater curvature of the gastric body. It was cauterized with hot biopsy forceps and hemostasis was achieved.  The stomach otherwise appeared normal. DUODENUM: Moderate duodenal inflammation, erosions, were found in the duodenal bulb. The duodenal mucosa showed no abnormalities in the 2nd part of the duodenum. Retroflexed views revealed no abnormalities.  The scope was then withdrawn from the patient and the procedure completed. COMPLICATIONS: There were no complications.  ENDOSCOPIC IMPRESSION: 1.   LA Class A esophagitis noted 2.   Erosive gastritis in the antrum; multiple biopsies 3.   Bleeding angiodysplastic lesion on the greater curvature of the gastric body; cauterized 4.   Erosive  duodenitis  RECOMMENDATIONS: 1.  Antireflux measures long term 2.  PPI qam long term: omeprazole 40 mg po qam, with 1 year of refills 3.  Avoid ASA/NSAIDs for 4 weeks then OK to use ASA 81 mg daily along with a PPI, if recommended by his PCP 4.  Fe replacement therapy and follow up with his PCP 5.  Consider CE study if anemia is recurrent or refractory  eSigned:  Meryl Dare, MD, [facility] 01/16/2013 3:26 PM

## 2013-01-16 NOTE — Progress Notes (Signed)
Patient did not experience any of the following events: a burn prior to discharge; a fall within the facility; wrong site/side/patient/procedure/implant event; or a hospital transfer or hospital admission upon discharge from the facility. (G8907) Patient did not have preoperative order for IV antibiotic SSI prophylaxis. (G8918)  

## 2013-01-16 NOTE — Patient Instructions (Addendum)

## 2013-01-17 ENCOUNTER — Telehealth: Payer: Self-pay | Admitting: *Deleted

## 2013-01-17 NOTE — Telephone Encounter (Signed)
No identifier, left message, follow-up  

## 2013-01-22 ENCOUNTER — Encounter: Payer: Self-pay | Admitting: Gastroenterology

## 2013-02-05 ENCOUNTER — Other Ambulatory Visit: Payer: Self-pay | Admitting: Internal Medicine

## 2013-02-08 ENCOUNTER — Telehealth: Payer: Self-pay | Admitting: Internal Medicine

## 2013-02-08 NOTE — Telephone Encounter (Signed)
Refill- BD UF III short pen nd. Use one every day as directed for insulin injection. Qty 100

## 2013-02-09 MED ORDER — INSULIN PEN NEEDLE 31G X 8 MM MISC: Status: DC

## 2013-02-09 NOTE — Telephone Encounter (Signed)
Rx request to pharmacy/SLS  

## 2013-02-12 ENCOUNTER — Telehealth: Payer: Self-pay | Admitting: Internal Medicine

## 2013-02-12 DIAGNOSIS — E119 Type 2 diabetes mellitus without complications: Secondary | ICD-10-CM

## 2013-02-12 MED ORDER — INSULIN PEN NEEDLE 31G X 8 MM MISC: Status: DC

## 2013-02-12 NOTE — Telephone Encounter (Signed)
b d uf iii short pen nd qty 100 last fill 08-31-2012

## 2013-02-12 NOTE — Telephone Encounter (Signed)
Request sent to [facility] pharmacy/SLS

## 2013-02-15 ENCOUNTER — Ambulatory Visit: Payer: Federal, State, Local not specified - PPO | Admitting: Family Medicine

## 2013-03-05 ENCOUNTER — Other Ambulatory Visit: Payer: Self-pay | Admitting: Internal Medicine

## 2013-03-12 ENCOUNTER — Telehealth: Payer: Self-pay | Admitting: Internal Medicine

## 2013-03-12 MED ORDER — LISINOPRIL 40 MG PO TABS: 40.0000 mg | ORAL_TABLET | Freq: Every day | ORAL | Status: DC

## 2013-03-12 MED ORDER — AMLODIPINE BESYLATE 10 MG PO TABS: 10.0000 mg | ORAL_TABLET | Freq: Every day | ORAL | Status: DC

## 2013-03-12 NOTE — Telephone Encounter (Signed)
Rx sent in to pharmacy. 

## 2013-03-12 NOTE — Telephone Encounter (Signed)
Amlodipine 10 mg tab qty 90

## 2013-03-12 NOTE — Telephone Encounter (Signed)
Lisinopril 40 mg tab qty 90 take 1 tablet by mouth every day

## 2013-03-12 NOTE — Telephone Encounter (Signed)
Refilled lisinopril 40mg  #90 0rf needs office visit with Dr. Rogelia Rohrer

## 2013-03-19 ENCOUNTER — Telehealth: Payer: Self-pay | Admitting: *Deleted

## 2013-03-19 DIAGNOSIS — E119 Type 2 diabetes mellitus without complications: Secondary | ICD-10-CM

## 2013-03-19 DIAGNOSIS — D649 Anemia, unspecified: Secondary | ICD-10-CM

## 2013-03-19 LAB — HEMOGLOBIN A1C
Hgb A1c MFr Bld: 7.9 % — ABNORMAL HIGH (ref <5.7)
Mean Plasma Glucose: 180 mg/dL — ABNORMAL HIGH (ref <117)

## 2013-03-19 LAB — BASIC METABOLIC PANEL
BUN: 18 mg/dL (ref 6–23)
CO2: 27 mEq/L (ref 19–32)
Calcium: 9.5 mg/dL (ref 8.4–10.5)
Chloride: 103 mEq/L (ref 96–112)
Creat: 0.9 mg/dL (ref 0.50–1.35)
Glucose, Bld: 205 mg/dL — ABNORMAL HIGH (ref 70–99)
Potassium: 5 mEq/L (ref 3.5–5.3)
Sodium: 140 mEq/L (ref 135–145)

## 2013-03-19 LAB — CBC WITH DIFFERENTIAL/PLATELET
Basophils Absolute: 0 10*3/uL (ref 0.0–0.1)
Basophils Relative: 0 % (ref 0–1)
Eosinophils Absolute: 0.2 10*3/uL (ref 0.0–0.7)
Eosinophils Relative: 2 % (ref 0–5)
HCT: 37.2 % — ABNORMAL LOW (ref 39.0–52.0)
Hemoglobin: 12.6 g/dL — ABNORMAL LOW (ref 13.0–17.0)
Lymphocytes Relative: 26 % (ref 12–46)
Lymphs Abs: 2.4 10*3/uL (ref 0.7–4.0)
MCH: 28.9 pg (ref 26.0–34.0)
MCHC: 33.9 g/dL (ref 30.0–36.0)
MCV: 85.3 fL (ref 78.0–100.0)
Monocytes Absolute: 0.6 10*3/uL (ref 0.1–1.0)
Monocytes Relative: 7 % (ref 3–12)
Neutro Abs: 6.1 10*3/uL (ref 1.7–7.7)
Neutrophils Relative %: 65 % (ref 43–77)
Platelets: 369 10*3/uL (ref 150–400)
RBC: 4.36 MIL/uL (ref 4.22–5.81)
RDW: 14.1 % (ref 11.5–15.5)
WBC: 9.4 10*3/uL (ref 4.0–10.5)

## 2013-03-19 LAB — VITAMIN B12: Vitamin B-12: 480 pg/mL (ref 211–911)

## 2013-03-19 LAB — FERRITIN: Ferritin: 35 ng/mL (ref 22–322)

## 2013-03-19 NOTE — Telephone Encounter (Signed)
Pt presented to the lab. ORders entered per 10/2012 office note as below:  Please schedule labs prior to next visit  Cbc, ferritin, b12-anemia, chem7, a1c-250.00

## 2013-03-21 ENCOUNTER — Encounter: Payer: Self-pay | Admitting: Family Medicine

## 2013-03-21 ENCOUNTER — Ambulatory Visit (INDEPENDENT_AMBULATORY_CARE_PROVIDER_SITE_OTHER): Payer: Federal, State, Local not specified - PPO | Admitting: Family Medicine

## 2013-03-21 VITALS — BP 132/78 | HR 80 | Temp 98.2°F | Ht 72.0 in | Wt 208.0 lb

## 2013-03-21 DIAGNOSIS — G4700 Insomnia, unspecified: Secondary | ICD-10-CM

## 2013-03-21 DIAGNOSIS — D649 Anemia, unspecified: Secondary | ICD-10-CM

## 2013-03-21 DIAGNOSIS — I10 Essential (primary) hypertension: Secondary | ICD-10-CM

## 2013-03-21 DIAGNOSIS — E119 Type 2 diabetes mellitus without complications: Secondary | ICD-10-CM

## 2013-03-21 DIAGNOSIS — I2510 Atherosclerotic heart disease of native coronary artery without angina pectoris: Secondary | ICD-10-CM

## 2013-03-21 NOTE — Patient Instructions (Addendum)
Hgba1c, lipid, renal, cbc, tsh, hepatic prior  Diabetes and Exercise Regular exercise is important and can help:   Control blood glucose (sugar).  Decrease blood pressure.    Control blood lipids (cholesterol, triglycerides).  Improve overall health. BENEFITS FROM EXERCISE  Improved fitness.  Improved flexibility.  Improved endurance.  Increased bone density.  Weight control.  Increased muscle strength.  Decreased body fat.  Improvement of the body's use of insulin, a hormone.  Increased insulin sensitivity.  Reduction of insulin needs.  Reduced stress and tension.  Helps you feel better. People with diabetes who add exercise to their lifestyle gain additional benefits, including:  Weight loss.  Reduced appetite.  Improvement of the body's use of blood glucose.  Decreased risk factors for heart disease:  Lowering of cholesterol and triglycerides.  Raising the level of good cholesterol (high-density lipoproteins, HDL).  Lowering blood sugar.  Decreased blood pressure. TYPE 1 DIABETES AND EXERCISE  Exercise will usually lower your blood glucose.  If blood glucose is greater than 240 mg/dl, check urine ketones. If ketones are present, do not exercise.  Location of the insulin injection sites may need to be adjusted with exercise. Avoid injecting insulin into areas of the body that will be exercised. For example, avoid injecting insulin into:  The arms when playing tennis.  The legs when jogging. For more information, discuss this with your caregiver.  Keep a record of:  Food intake.  Type and amount of exercise.  Expected peak times of insulin action.  Blood glucose levels. Do this before, during, and after exercise. Review your records with your caregiver. This will help you to develop guidelines for adjusting food intake and insulin amounts.  TYPE 2 DIABETES AND EXERCISE  Regular physical activity can help control blood  glucose.  Exercise is important because it may:  Increase the body's sensitivity to insulin.  Improve blood glucose control.  Exercise reduces the risk of heart disease. It decreases serum cholesterol and triglycerides. It also lowers blood pressure.  Those who take insulin or oral hypoglycemic agents should watch for signs of hypoglycemia. These signs include dizziness, shaking, sweating, chills, and confusion.  Body water is lost during exercise. It must be replaced. This will help to avoid loss of body fluids (dehydration) or heat stroke. Be sure to talk to your caregiver before starting an exercise program to make sure it is safe for you. Remember, any activity is better than none.  Document Released: 01/22/2004 Document Revised: 01/24/2012 Document Reviewed: 05/08/2009 [facility] Patient Information 2013 [place], [place].

## 2013-03-25 ENCOUNTER — Encounter: Payer: Self-pay | Admitting: Family Medicine

## 2013-03-25 DIAGNOSIS — G4700 Insomnia, unspecified: Secondary | ICD-10-CM

## 2013-03-25 HISTORY — DX: Insomnia, unspecified: G47.00

## 2013-03-25 NOTE — Assessment & Plan Note (Signed)
Will try Tylenol PM prn, he has been doing well.

## 2013-03-25 NOTE — Assessment & Plan Note (Signed)
Mild but also in need of a screening colonoscopy, is referred at this time.

## 2013-03-25 NOTE — Progress Notes (Signed)
Patient ID: Gene [name], male   DOB: 07-Dec-1955, 57 y.o.   MRN: [id number] Gene [name] [id number] 18-Dec-1955 03/25/2013      Progress Note-Follow Up  Subjective  Chief Complaint  Chief Complaint  Patient presents with  . Follow-up    HPI  Patient is a 57 yo male in today for follow up. No new concerns. Sugars have been 120 to 130 inn am. Denies polyuria and polydipsia. No recent illness, fevers, chills, CP, palp, SOB, GI or GU concerns. No new concerns.   Past Medical History  Diagnosis Date  . History of colonic polyps   . Diabetes mellitus type II   . GERD (gastroesophageal reflux disease)   . Hyperlipidemia   . Hypertension   . CAD (coronary artery disease)   . PVD (peripheral vascular disease)     PTA RLE 12/08 [place]  . Diabetic neuropathy   . Plantar fasciitis   . Chronic pain in right foot   . Diabetes   . Anemia   . Neuromuscular disorder     NEUROPATHY  . Insomnia 03/25/2013    Past Surgical History  Procedure Laterality Date  . Coronary artery bypass graft      200 SVG to diagonal, LIMA to LAD, SVG to LCX  . Other surgical history      PAD surgery  . Revision total knee arthroplasty      Family History  Problem Relation Age of Onset  . Hyperlipidemia    . Hypertension    . Stroke    . Coronary artery disease Mother   . Hypertension Father   . Colon cancer Neg Hx   . Esophageal cancer Neg Hx     History   Social History  . Marital Status: Married    Spouse Name: N/A    Number of Children: N/A  . Years of Education: N/A   Occupational History  . Not on file.   Social History Main Topics  . Smoking status: Current Some Day Smoker -- 0.50 packs/day for 40 years    Types: Cigarettes  . Smokeless tobacco: Never Used  . Alcohol Use: No  . Drug Use: No     Comment: rare  . Sexually Active: Not on file   Other Topics Concern  . Not on file   Social History Narrative   Occupation: grocery clerk   Second marriage - 3 children  from first marriage   Tobacco Use - Yes.     Alcohol Use - yes(rare)            Current Outpatient Prescriptions on File Prior to Visit  Medication Sig Dispense Refill  . amLODipine (NORVASC) 10 MG tablet Take 1 tablet (10 mg total) by mouth daily.  90 tablet  1  . Blood Glucose Monitoring Suppl (ONETOUCH VERIO IQ SYSTEM) W/DEVICE KIT 1 kit by Does not apply route as directed. Use to test blood glucose three times daily. Dx: 250.00  1 kit  0  . ferrous sulfate 325 (65 FE) MG tablet Take 1 tablet (325 mg total) by mouth 2 (two) times daily before a meal.  60 tablet  2  . fish oil-omega-3 fatty acids 1000 MG capsule Take 1 g by mouth at bedtime.        . gabapentin (NEURONTIN) 600 MG tablet Take 1 tablet (600 mg total) by mouth 3 (three) times daily.  90 tablet  5  . glimepiride (AMARYL) 4 MG tablet TAKE 1 TABLET DAILY WITH  BREAKFAST  30 tablet  5  . Glucose Blood DISK 1 each by In Vitro route 3 (three) times daily.  100 each  3  . glucose blood test strip OneTouch Verio IQ. Use as instructed to test blood glucose three times daily. Dx: 250.00  125 each  11  . insulin glargine (LANTUS SOLOSTAR) 100 UNIT/ML injection Inject 20-30 Units into the skin at bedtime.  5 pen  PRN  . Insulin Pen Needle 31G X 8 MM MISC BD Ultra-Fine III Short Pen Needles. Use As Directed Once A Day for Insulin Injections Dx: 250.00  100 each  5  . labetalol (NORMODYNE) 200 MG tablet Take 1 tablet (200 mg total) by mouth 2 (two) times daily.  180 tablet  1  . lisinopril (PRINIVIL,ZESTRIL) 40 MG tablet Take 1 tablet (40 mg total) by mouth daily.  90 tablet  0  . MEIJER PEN NEEDLES 31G X 8 MM MISC Use as directed once a day for insulin injection  120 each  1  . metFORMIN (GLUCOPHAGE) 1000 MG tablet TAKE 1 TABLET TWICE DAIL;Y WITH A MEAL  60 tablet  4  . Multiple Vitamins-Minerals (CENTRUM PO) Take by mouth daily.        . nitroGLYCERIN (NITROSTAT) 0.4 MG SL tablet Place 0.4 mg under the tongue every 5 (five) minutes as  needed.        Marland Kitchen omeprazole (PRILOSEC) 40 MG capsule Take 1 capsule (40 mg total) by mouth daily.  90 capsule  4  . ONE TOUCH LANCETS MISC Use with OneTouch Verio IQ monitor and strips to test Blood Glucose Three Times Daily. Dx: 250.00  200 each  6   No current facility-administered medications on file prior to visit.    No Known Allergies  Review of Systems  Review of Systems  Constitutional: Negative for fever and malaise/fatigue.  HENT: Negative for congestion.   Eyes: Negative for pain and discharge.  Respiratory: Negative for shortness of breath.   Cardiovascular: Negative for chest pain, palpitations and leg swelling.  Gastrointestinal: Negative for nausea, abdominal pain and diarrhea.  Genitourinary: Negative for dysuria.  Musculoskeletal: Negative for falls.  Skin: Negative for rash.  Neurological: Negative for loss of consciousness and headaches.  Endo/Heme/Allergies: Negative for polydipsia.  Psychiatric/Behavioral: Negative for depression and suicidal ideas. The patient is not nervous/anxious and does not have insomnia.     Objective  BP 132/78  Pulse 80  Temp(Src) 98.2 F (36.8 C) (Oral)  Ht 6' (1.829 m)  Wt 208 lb 0.6 oz (94.366 kg)  BMI 28.21 kg/m2  SpO2 98%  Physical Exam  Physical Exam  Constitutional: He is oriented to person, place, and time and well-developed, well-nourished, and in no distress. No distress.  HENT:  Head: Normocephalic and atraumatic.  Eyes: Conjunctivae are normal.  Neck: Neck supple. No thyromegaly present.  Cardiovascular: Normal rate, regular rhythm and normal heart sounds.   No murmur heard. Pulmonary/Chest: Effort normal and breath sounds normal. No respiratory distress.  Abdominal: He exhibits no distension and no mass. There is no tenderness.  Musculoskeletal: He exhibits no edema.  Neurological: He is alert and oriented to person, place, and time.  Skin: Skin is warm.  Psychiatric: Memory, affect and judgment normal.     Lab Results  Component Value Date   TSH 1.13 09/04/2008   Lab Results  Component Value Date   WBC 9.4 03/19/2013   HGB 12.6* 03/19/2013   HCT 37.2* 03/19/2013   MCV 85.3 03/19/2013  PLT 369 03/19/2013   Lab Results  Component Value Date   CREATININE 0.90 03/19/2013   BUN 18 03/19/2013   NA 140 03/19/2013   K 5.0 03/19/2013   CL 103 03/19/2013   CO2 27 03/19/2013   Lab Results  Component Value Date   ALT 16 10/31/2012   AST 21 10/31/2012   ALKPHOS 113 10/31/2012   BILITOT 0.4 10/31/2012   Lab Results  Component Value Date   CHOL 136 10/31/2012   Lab Results  Component Value Date   HDL 32* 10/31/2012   Lab Results  Component Value Date   LDLCALC 93 10/31/2012   Lab Results  Component Value Date   TRIG 55 10/31/2012   Lab Results  Component Value Date   CHOLHDL 4.3 10/31/2012     Assessment & Plan  HYPERTENSION Well controlled, no changes  DIABETES MELLITUS, TYPE II Patient reports am sugars around 120 to 130, hgba1c is up. Encouraged minimize simple carbs increase exercise. Continue meds and if no improvement will need to increase meds further  Insomnia Will try Tylenol PM prn, he has been doing well.  CORONARY ARTERY DISEASE No recent episodes  Anemia Mild but also in need of a screening colonoscopy, is referred at this time.

## 2013-03-25 NOTE — Assessment & Plan Note (Signed)
No recent episodes

## 2013-03-25 NOTE — Assessment & Plan Note (Signed)
Patient reports am sugars around 120 to 130, hgba1c is up. Encouraged minimize simple carbs increase exercise. Continue meds and if no improvement will need to increase meds further

## 2013-03-25 NOTE — Assessment & Plan Note (Signed)
Well controlled, no changes 

## 2013-05-24 ENCOUNTER — Other Ambulatory Visit: Payer: Self-pay

## 2013-06-11 ENCOUNTER — Other Ambulatory Visit: Payer: Self-pay | Admitting: *Deleted

## 2013-06-11 MED ORDER — LABETALOL HCL 200 MG PO TABS: 200.0000 mg | ORAL_TABLET | Freq: Two times a day (BID) | ORAL | Status: DC

## 2013-06-11 MED ORDER — LISINOPRIL 40 MG PO TABS: 40.0000 mg | ORAL_TABLET | Freq: Every day | ORAL | Status: DC

## 2013-06-11 NOTE — Telephone Encounter (Signed)
Rx request to pharmacy; **OFFICE VISIT REQUIRED PRIOR TO FUTURE REFILLS** pt has appt scheduled w/Dr. Abner Greenspan 10.06.14/SLS

## 2013-06-11 NOTE — Telephone Encounter (Signed)
Rx request to pharmacy; **OFFICE VISIT REQUIRED PRIOR TO FUTURE REFILLS**, pt has appt scheduled 10.06.14 w/Dr. Blyth/SLS

## 2013-08-13 ENCOUNTER — Other Ambulatory Visit: Payer: Self-pay | Admitting: Internal Medicine

## 2013-08-13 MED ORDER — GLIMEPIRIDE 4 MG PO TABS: ORAL_TABLET | ORAL | Status: DC

## 2013-08-13 MED ORDER — METFORMIN HCL 1000 MG PO TABS: ORAL_TABLET | ORAL | Status: DC

## 2013-08-13 NOTE — Telephone Encounter (Signed)
Refill request for gabapentin Last filled by MD on - 11/16/12 #90 x5 Last Appt- 03/21/13 Next Appt- 10/6/1 Please advise refill?

## 2013-08-13 NOTE — Telephone Encounter (Signed)
OK to refill gabapentin at same strength, same sig, #60 with 2 rf til seen

## 2013-08-13 NOTE — Telephone Encounter (Signed)
Refill- gabapentin 600mg  tab. Take one tablet by mouth three times daily. Qty 90 last fill 07/01/13

## 2013-08-13 NOTE — Telephone Encounter (Signed)
Metformin hcl 1000mg  tab. Take one tablet twice daily with a meal. Qty 60 last fill 9.5.14  Refill-glimepiride 4mg  tab. Take one tablet daily with breakfast. Qty 30 last fill 9.5.14

## 2013-08-14 MED ORDER — GABAPENTIN 600 MG PO TABS: 600.0000 mg | ORAL_TABLET | Freq: Three times a day (TID) | ORAL | Status: DC

## 2013-08-14 NOTE — Telephone Encounter (Signed)
Please advise quantity? Pt was getting 90

## 2013-08-15 ENCOUNTER — Other Ambulatory Visit: Payer: Self-pay | Admitting: *Deleted

## 2013-08-15 MED ORDER — GABAPENTIN 600 MG PO TABS: 600.0000 mg | ORAL_TABLET | Freq: Three times a day (TID) | ORAL | Status: DC

## 2013-08-15 MED ORDER — METFORMIN HCL 1000 MG PO TABS: ORAL_TABLET | ORAL | Status: DC

## 2013-08-15 MED ORDER — GLIMEPIRIDE 4 MG PO TABS: ORAL_TABLET | ORAL | Status: DC

## 2013-08-15 NOTE — Progress Notes (Signed)
A user error has taken place: medication ordered in error, not dispensed to this patient; sent to wrong pharmacy initially, corrected/SLS

## 2013-08-16 ENCOUNTER — Telehealth: Payer: Self-pay | Admitting: *Deleted

## 2013-08-16 DIAGNOSIS — I10 Essential (primary) hypertension: Secondary | ICD-10-CM

## 2013-08-16 DIAGNOSIS — E119 Type 2 diabetes mellitus without complications: Secondary | ICD-10-CM

## 2013-08-16 DIAGNOSIS — E785 Hyperlipidemia, unspecified: Secondary | ICD-10-CM

## 2013-08-16 LAB — HEPATIC FUNCTION PANEL
ALT: 14 U/L (ref 0–53)
AST: 14 U/L (ref 0–37)
Albumin: 4.2 g/dL (ref 3.5–5.2)
Alkaline Phosphatase: 103 U/L (ref 39–117)
Bilirubin, Direct: 0.1 mg/dL (ref 0.0–0.3)
Indirect Bilirubin: 0.2 mg/dL (ref 0.0–0.9)
Total Bilirubin: 0.3 mg/dL (ref 0.3–1.2)
Total Protein: 6.7 g/dL (ref 6.0–8.3)

## 2013-08-16 LAB — RENAL FUNCTION PANEL
Albumin: 4.2 g/dL (ref 3.5–5.2)
BUN: 17 mg/dL (ref 6–23)
CO2: 29 mEq/L (ref 19–32)
Calcium: 9.4 mg/dL (ref 8.4–10.5)
Chloride: 105 mEq/L (ref 96–112)
Creat: 0.83 mg/dL (ref 0.50–1.35)
Glucose, Bld: 154 mg/dL — ABNORMAL HIGH (ref 70–99)
Phosphorus: 2.7 mg/dL (ref 2.3–4.6)
Potassium: 4.5 mEq/L (ref 3.5–5.3)
Sodium: 140 mEq/L (ref 135–145)

## 2013-08-16 LAB — CBC
HCT: 35.1 % — ABNORMAL LOW (ref 39.0–52.0)
Hemoglobin: 12 g/dL — ABNORMAL LOW (ref 13.0–17.0)
MCH: 30.2 pg (ref 26.0–34.0)
MCHC: 34.2 g/dL (ref 30.0–36.0)
MCV: 88.2 fL (ref 78.0–100.0)
Platelets: 366 10*3/uL (ref 150–400)
RBC: 3.98 MIL/uL — ABNORMAL LOW (ref 4.22–5.81)
RDW: 14.1 % (ref 11.5–15.5)
WBC: 8 10*3/uL (ref 4.0–10.5)

## 2013-08-16 LAB — LIPID PANEL
Cholesterol: 146 mg/dL (ref 0–200)
HDL: 28 mg/dL — ABNORMAL LOW (ref >39)
LDL Cholesterol: 103 mg/dL — ABNORMAL HIGH (ref 0–99)
Total CHOL/HDL Ratio: 5.2 Ratio
Triglycerides: 74 mg/dL (ref <150)
VLDL: 15 mg/dL (ref 0–40)

## 2013-08-16 LAB — HEMOGLOBIN A1C
Hgb A1c MFr Bld: 7.7 % — ABNORMAL HIGH (ref <5.7)
Mean Plasma Glucose: 174 mg/dL — ABNORMAL HIGH (ref <117)

## 2013-08-16 NOTE — Telephone Encounter (Signed)
Pt presented to the lab. Order entered per 03/2013 office note as below:  Hgba1c, lipid, renal, cbc, tsh, hepatic prior

## 2013-08-17 LAB — TSH: TSH: 1.232 u[IU]/mL (ref 0.350–4.500)

## 2013-08-20 ENCOUNTER — Telehealth (INDEPENDENT_AMBULATORY_CARE_PROVIDER_SITE_OTHER): Payer: Federal, State, Local not specified - PPO | Admitting: Family Medicine

## 2013-08-20 ENCOUNTER — Encounter: Payer: Self-pay | Admitting: Family Medicine

## 2013-08-20 VITALS — BP 128/72 | HR 69 | Temp 98.1°F | Ht 72.0 in | Wt 195.0 lb

## 2013-08-20 DIAGNOSIS — F17200 Nicotine dependence, unspecified, uncomplicated: Secondary | ICD-10-CM

## 2013-08-20 DIAGNOSIS — E785 Hyperlipidemia, unspecified: Secondary | ICD-10-CM

## 2013-08-20 DIAGNOSIS — E119 Type 2 diabetes mellitus without complications: Secondary | ICD-10-CM

## 2013-08-20 DIAGNOSIS — Z23 Encounter for immunization: Secondary | ICD-10-CM

## 2013-08-20 DIAGNOSIS — I10 Essential (primary) hypertension: Secondary | ICD-10-CM

## 2013-08-20 MED ORDER — GLIMEPIRIDE 4 MG PO TABS: 4.0000 mg | ORAL_TABLET | Freq: Two times a day (BID) | ORAL | Status: DC

## 2013-08-20 NOTE — Patient Instructions (Addendum)
Consider a probiotic such as Digestive Advantage or a generic daily  Diarrhea Diarrhea is frequent loose and watery bowel movements. It can cause you to feel weak and dehydrated. Dehydration can cause you to become tired and thirsty, have a dry mouth, and have decreased urination that often is dark yellow. Diarrhea is a sign of another problem, most often an infection that will not last long. In most cases, diarrhea typically lasts 2 3 days. However, it can last longer if it is a sign of something more serious. It is important to treat your diarrhea as directed by your caregive to lessen or prevent future episodes of diarrhea. CAUSES  Some common causes include:  Gastrointestinal infections caused by viruses, bacteria, or parasites.  Food poisoning or food allergies.  Certain medicines, such as antibiotics, chemotherapy, and laxatives.  Artificial sweeteners and fructose.  Digestive disorders. HOME CARE INSTRUCTIONS  Ensure adequate fluid intake (hydration): have 1 cup (8 oz) of fluid for each diarrhea episode. Avoid fluids that contain simple sugars or sports drinks, fruit juices, whole milk products, and sodas. Your urine should be clear or pale yellow if you are drinking enough fluids. Hydrate with an oral rehydration solution that you can purchase at pharmacies, retail stores, and online. You can prepare an oral rehydration solution at home by mixing the following ingredients together:    tsp table salt.   tsp baking soda.   tsp salt substitute containing potassium chloride.  1  tablespoons sugar.  1 L (34 oz) of water.  Certain foods and beverages may increase the speed at which food moves through the gastrointestinal (GI) tract. These foods and beverages should be avoided and include:  Caffeinated and alcoholic beverages.  High-fiber foods, such as raw fruits and vegetables, nuts, seeds, and whole grain breads and cereals.  Foods and beverages sweetened with sugar alcohols,  such as xylitol, sorbitol, and mannitol.  Some foods may be well tolerated and may help thicken stool including:  Starchy foods, such as rice, toast, pasta, low-sugar cereal, oatmeal, grits, baked potatoes, crackers, and bagels.  Bananas.  Applesauce.  Add probiotic-rich foods to help increase healthy bacteria in the GI tract, such as yogurt and fermented milk products.  Wash your hands well after each diarrhea episode.  Only take over-the-counter or prescription medicines as directed by your caregiver.  Take a warm bath to relieve any burning or pain from frequent diarrhea episodes. SEEK IMMEDIATE MEDICAL CARE IF:   You are unable to keep fluids down.  You have persistent vomiting.  You have blood in your stool, or your stools are black and tarry.  You do not urinate in 6 8 hours, or there is only a small amount of very dark urine.  You have abdominal pain that increases or localizes.  You have weakness, dizziness, confusion, or lightheadedness.  You have a severe headache.  Your diarrhea gets worse or does not get better.  You have a fever or persistent symptoms for more than 2 3 days.  You have a fever and your symptoms suddenly get worse. MAKE SURE YOU:   Understand these instructions.  Will watch your condition.  Will get help right away if you are not doing well or get worse. Document Released: 10/22/2002 Document Revised: 10/18/2012 Document Reviewed: 07/09/2012 [facility] Patient Information 2014 [place], [place].

## 2013-08-20 NOTE — Assessment & Plan Note (Addendum)
Patient has noted improved sugar despite stopping his Insulin, he has been eating much better and avoiding simple carbs. Will not restart Insulin at this time. Continue Metformin.

## 2013-08-20 NOTE — Progress Notes (Signed)
Patient ID: Gene [name], male   DOB: [birth year], 57 y.o.   MRN: [id number] Gene [name] [id number] 1956-09-01 08/20/2013      Progress Note-Follow Up  Subjective  Chief Complaint  Chief Complaint  Patient presents with  . Follow-up    4 month  . Injections    flu    HPI  Patient is a 57 year old male who is in today for followup. Is generally feeling well although he does note he stopped his insulin. He changed his diet and has been eating better and reports his blood sugars have been well controlled generally 100 to once a time. He has lost a good deal of weight as well. Denies any recent illness. No chest pain, palpitations, shortness or breath, GI or GU concerns otherwise noted today. Agrees to flu shot today.  Past Medical History  Diagnosis Date  . History of colonic polyps   . Diabetes mellitus type II   . GERD (gastroesophageal reflux disease)   . Hyperlipidemia   . Hypertension   . CAD (coronary artery disease)   . PVD (peripheral vascular disease)     PTA RLE 12/08 [place]  . Diabetic neuropathy   . Plantar fasciitis   . Chronic pain in right foot   . Diabetes   . Anemia   . Neuromuscular disorder     NEUROPATHY  . Insomnia 03/25/2013    Past Surgical History  Procedure Laterality Date  . Coronary artery bypass graft      200 SVG to diagonal, LIMA to LAD, SVG to LCX  . Other surgical history      PAD surgery  . Revision total knee arthroplasty      Family History  Problem Relation Age of Onset  . Hyperlipidemia    . Hypertension    . Stroke    . Coronary artery disease Mother   . Hypertension Father   . Colon cancer Neg Hx   . Esophageal cancer Neg Hx     History   Social History  . Marital Status: Married    Spouse Name: N/A    Number of Children: N/A  . Years of Education: N/A   Occupational History  . Not on file.   Social History Main Topics  . Smoking status: Current Some Day Smoker -- 0.50 packs/day for 40 years    Types:  Cigarettes  . Smokeless tobacco: Never Used  . Alcohol Use: No  . Drug Use: No     Comment: rare  . Sexual Activity: Not on file   Other Topics Concern  . Not on file   Social History Narrative   Occupation: grocery clerk   Second marriage - 3 children from first marriage   Tobacco Use - Yes.     Alcohol Use - yes(rare)            Current Outpatient Prescriptions on File Prior to Visit  Medication Sig Dispense Refill  . amLODipine (NORVASC) 10 MG tablet Take 1 tablet (10 mg total) by mouth daily.  90 tablet  1  . Blood Glucose Monitoring Suppl (ONETOUCH VERIO IQ SYSTEM) W/DEVICE KIT 1 kit by Does not apply route as directed. Use to test blood glucose three times daily. Dx: 250.00  1 kit  0  . ferrous sulfate 325 (65 FE) MG tablet Take 1 tablet (325 mg total) by mouth 2 (two) times daily before a meal.  60 tablet  2  . fish oil-omega-3 fatty acids 1000 MG  capsule Take 1 g by mouth at bedtime.        . gabapentin (NEURONTIN) 600 MG tablet Take 1 tablet (600 mg total) by mouth 3 (three) times daily.  90 tablet  5  . glimepiride (AMARYL) 4 MG tablet TAKE 1 TABLET DAILY WITH BREAKFAST  30 tablet  5  . Glucose Blood DISK 1 each by In Vitro route 3 (three) times daily.  100 each  3  . glucose blood test strip OneTouch Verio IQ. Use as instructed to test blood glucose three times daily. Dx: 250.00  125 each  11  . insulin glargine (LANTUS SOLOSTAR) 100 UNIT/ML injection Inject 20-30 Units into the skin at bedtime.  5 pen  PRN  . Insulin Pen Needle 31G X 8 MM MISC BD Ultra-Fine III Short Pen Needles. Use As Directed Once A Day for Insulin Injections Dx: 250.00  100 each  5  . labetalol (NORMODYNE) 200 MG tablet Take 1 tablet (200 mg total) by mouth 2 (two) times daily.  180 tablet  0  . lisinopril (PRINIVIL,ZESTRIL) 40 MG tablet Take 1 tablet (40 mg total) by mouth daily.  90 tablet  0  . MEIJER PEN NEEDLES 31G X 8 MM MISC Use as directed once a day for insulin injection  120 each  1  .  metFORMIN (GLUCOPHAGE) 1000 MG tablet TAKE 1 TABLET TWICE DAIL;Y WITH A MEAL  60 tablet  5  . Multiple Vitamins-Minerals (CENTRUM PO) Take by mouth daily.        . nitroGLYCERIN (NITROSTAT) 0.4 MG SL tablet Place 0.4 mg under the tongue every 5 (five) minutes as needed.        Marland Kitchen omeprazole (PRILOSEC) 40 MG capsule Take 1 capsule (40 mg total) by mouth daily.  90 capsule  4  . ONE TOUCH LANCETS MISC Use with OneTouch Verio IQ monitor and strips to test Blood Glucose Three Times Daily. Dx: 250.00  200 each  6   No current facility-administered medications on file prior to visit.    No Known Allergies  Review of Systems  Review of Systems  Constitutional: Negative for fever and malaise/fatigue.  HENT: Negative for congestion.   Eyes: Negative for discharge.  Respiratory: Negative for shortness of breath.   Cardiovascular: Negative for chest pain, palpitations and leg swelling.  Gastrointestinal: Negative for nausea, abdominal pain and diarrhea.  Genitourinary: Negative for dysuria.  Musculoskeletal: Negative for falls.  Skin: Negative for rash.  Neurological: Negative for loss of consciousness and headaches.  Endo/Heme/Allergies: Negative for polydipsia.  Psychiatric/Behavioral: Negative for depression and suicidal ideas. The patient is not nervous/anxious and does not have insomnia.      Objective  BP 128/72  Pulse 69  Temp(Src) 98.1 F (36.7 C) (Oral)  Ht 6' (1.829 m)  Wt 195 lb 0.6 oz (88.47 kg)  BMI 26.45 kg/m2  SpO2 99%  Physical Exam  Physical Exam  Constitutional: He is oriented to person, place, and time and well-developed, well-nourished, and in no distress. No distress.  HENT:  Head: Normocephalic and atraumatic.  Eyes: Conjunctivae are normal.  Neck: Neck supple. No thyromegaly present.  Cardiovascular: Normal rate, regular rhythm and normal heart sounds.   No murmur heard. Pulmonary/Chest: Effort normal and breath sounds normal. No respiratory distress.   Abdominal: He exhibits no distension and no mass. There is no tenderness.  Musculoskeletal: He exhibits no edema.  Neurological: He is alert and oriented to person, place, and time.  Skin: Skin is warm.  Psychiatric: Memory, affect and judgment normal.    Lab Results  Component Value Date   TSH 1.232 08/16/2013   Lab Results  Component Value Date   WBC 8.0 08/16/2013   HGB 12.0* 08/16/2013   HCT 35.1* 08/16/2013   MCV 88.2 08/16/2013   PLT 366 08/16/2013   Lab Results  Component Value Date   CREATININE 0.83 08/16/2013   BUN 17 08/16/2013   NA 140 08/16/2013   K 4.5 08/16/2013   CL 105 08/16/2013   CO2 29 08/16/2013   Lab Results  Component Value Date   ALT 14 08/16/2013   AST 14 08/16/2013   ALKPHOS 103 08/16/2013   BILITOT 0.3 08/16/2013   Lab Results  Component Value Date   CHOL 146 08/16/2013   Lab Results  Component Value Date   HDL 28* 08/16/2013   Lab Results  Component Value Date   LDLCALC 103* 08/16/2013   Lab Results  Component Value Date   TRIG 74 08/16/2013   Lab Results  Component Value Date   CHOLHDL 5.2 08/16/2013     Assessment & Plan  DIABETES MELLITUS, TYPE II Patient has noted improved sugar despite stopping his Insulin, he has been eating much better and avoiding simple carbs. Will not restart Insulin at this time. Continue Metformin.   HYPERTENSION Well controlled, no changes today.  HYPERLIPIDEMIA Avoid trans fats and minimize simple carbs, increase exercise and add a krill oil caps. Recheck lipids with next visit.  TOBACCO USER Encouraged complete cessation.

## 2013-08-25 NOTE — Assessment & Plan Note (Signed)
Well controlled, no changes today 

## 2013-08-25 NOTE — Assessment & Plan Note (Signed)
Avoid trans fats and minimize simple carbs, increase exercise and add a krill oil caps. Recheck lipids with next visit.

## 2013-08-25 NOTE — Assessment & Plan Note (Signed)
Encouraged complete cessation. 

## 2013-09-14 ENCOUNTER — Other Ambulatory Visit: Payer: Self-pay | Admitting: Family Medicine

## 2013-09-17 ENCOUNTER — Other Ambulatory Visit: Payer: Self-pay | Admitting: *Deleted

## 2013-09-17 MED ORDER — LISINOPRIL 40 MG PO TABS: 40.0000 mg | ORAL_TABLET | Freq: Every day | ORAL | Status: DC

## 2013-09-17 MED ORDER — LABETALOL HCL 200 MG PO TABS: 200.0000 mg | ORAL_TABLET | Freq: Two times a day (BID) | ORAL | Status: DC

## 2013-09-17 MED ORDER — AMLODIPINE BESYLATE 10 MG PO TABS: 10.0000 mg | ORAL_TABLET | Freq: Every day | ORAL | Status: DC

## 2013-09-17 NOTE — Addendum Note (Signed)
Addended by: Regis Bill on: 09/17/2013 01:59 PM   Modules accepted: Orders

## 2013-09-17 NOTE — Telephone Encounter (Signed)
Rx request to pharmacy, 30-day; **OFFICE VISIT NEEDED PRIOR TO FUTURE REFILLS**/SLS

## 2013-09-20 ENCOUNTER — Other Ambulatory Visit: Payer: Self-pay

## 2013-10-24 ENCOUNTER — Telehealth: Payer: Self-pay | Admitting: Family Medicine

## 2013-10-24 MED ORDER — LISINOPRIL 40 MG PO TABS: 40.0000 mg | ORAL_TABLET | Freq: Every day | ORAL | Status: DC

## 2013-10-24 MED ORDER — AMLODIPINE BESYLATE 10 MG PO TABS: 10.0000 mg | ORAL_TABLET | Freq: Every day | ORAL | Status: DC

## 2013-10-24 NOTE — Telephone Encounter (Signed)
Amlodipine 10 mg tablat qty 30  Take 1 tablet by mouth once daily patient  Lisinopril 40 mg tab take 1 by mouth once daily

## 2013-10-25 ENCOUNTER — Other Ambulatory Visit: Payer: Self-pay

## 2013-10-25 DIAGNOSIS — E119 Type 2 diabetes mellitus without complications: Secondary | ICD-10-CM

## 2013-10-25 MED ORDER — GLIMEPIRIDE 4 MG PO TABS: 4.0000 mg | ORAL_TABLET | Freq: Every day | ORAL | Status: DC

## 2013-10-31 ENCOUNTER — Other Ambulatory Visit: Payer: Self-pay | Admitting: *Deleted

## 2013-10-31 NOTE — Telephone Encounter (Signed)
Received message from pt requesting 90 day supply of labetalol. Left message for pt to call back and verify which pharmacy to send refill to?

## 2013-11-01 MED ORDER — LABETALOL HCL 200 MG PO TABS: 200.0000 mg | ORAL_TABLET | Freq: Two times a day (BID) | ORAL | Status: DC

## 2013-12-10 ENCOUNTER — Telehealth: Payer: Self-pay | Admitting: Family Medicine

## 2013-12-10 MED ORDER — AMLODIPINE BESYLATE 10 MG PO TABS: 10.0000 mg | ORAL_TABLET | Freq: Every day | ORAL | Status: DC

## 2013-12-10 NOTE — Telephone Encounter (Signed)
30 day supply sent.  Please call pt to arrange f/u before further refills are needed. He was last seen in May 2014 and advised 4 month follow up and is past due.

## 2013-12-10 NOTE — Telephone Encounter (Signed)
Refill-amlodipine besylate 10mg  tab. Take one tablet by mouth once daily *office visit for anymore refills* qty 30 last fill 12.10.14

## 2013-12-11 MED ORDER — AMLODIPINE BESYLATE 10 MG PO TABS: 10.0000 mg | ORAL_TABLET | Freq: Every day | ORAL | Status: DC

## 2013-12-11 NOTE — Telephone Encounter (Signed)
Patient already had appointment scheduled for 12/17/13

## 2013-12-17 ENCOUNTER — Ambulatory Visit (INDEPENDENT_AMBULATORY_CARE_PROVIDER_SITE_OTHER): Payer: Federal, State, Local not specified - PPO | Admitting: Family Medicine

## 2013-12-17 ENCOUNTER — Encounter: Payer: Self-pay | Admitting: Family Medicine

## 2013-12-17 ENCOUNTER — Telehealth: Payer: Self-pay | Admitting: Family Medicine

## 2013-12-17 VITALS — BP 140/82 | HR 86 | Temp 98.2°F | Ht 72.0 in | Wt 208.1 lb

## 2013-12-17 DIAGNOSIS — L408 Other psoriasis: Secondary | ICD-10-CM

## 2013-12-17 DIAGNOSIS — L409 Psoriasis, unspecified: Secondary | ICD-10-CM

## 2013-12-17 DIAGNOSIS — E119 Type 2 diabetes mellitus without complications: Secondary | ICD-10-CM

## 2013-12-17 DIAGNOSIS — Z0000 Encounter for general adult medical examination without abnormal findings: Secondary | ICD-10-CM

## 2013-12-17 DIAGNOSIS — I10 Essential (primary) hypertension: Secondary | ICD-10-CM

## 2013-12-17 LAB — RENAL FUNCTION PANEL
Albumin: 4.2 g/dL (ref 3.5–5.2)
BUN: 20 mg/dL (ref 6–23)
CO2: 28 mEq/L (ref 19–32)
Calcium: 9.8 mg/dL (ref 8.4–10.5)
Chloride: 97 mEq/L (ref 96–112)
Creat: 1.06 mg/dL (ref 0.50–1.35)
Glucose, Bld: 283 mg/dL — ABNORMAL HIGH (ref 70–99)
Phosphorus: 3.7 mg/dL (ref 2.3–4.6)
Potassium: 5.1 mEq/L (ref 3.5–5.3)
Sodium: 136 mEq/L (ref 135–145)

## 2013-12-17 LAB — HEMOGLOBIN A1C
Hgb A1c MFr Bld: 10 % — ABNORMAL HIGH (ref <5.7)
Mean Plasma Glucose: 240 mg/dL — ABNORMAL HIGH (ref <117)

## 2013-12-17 LAB — HEPATIC FUNCTION PANEL
ALT: 9 U/L (ref 0–53)
AST: 8 U/L (ref 0–37)
Albumin: 4.2 g/dL (ref 3.5–5.2)
Alkaline Phosphatase: 134 U/L — ABNORMAL HIGH (ref 39–117)
Bilirubin, Direct: 0.1 mg/dL (ref 0.0–0.3)
Indirect Bilirubin: 0.3 mg/dL (ref 0.2–1.2)
Total Bilirubin: 0.4 mg/dL (ref 0.2–1.2)
Total Protein: 7.2 g/dL (ref 6.0–8.3)

## 2013-12-17 LAB — LIPID PANEL
Cholesterol: 165 mg/dL (ref 0–200)
HDL: 29 mg/dL — ABNORMAL LOW (ref >39)
LDL Cholesterol: 113 mg/dL — ABNORMAL HIGH (ref 0–99)
Total CHOL/HDL Ratio: 5.7 Ratio
Triglycerides: 115 mg/dL (ref <150)
VLDL: 23 mg/dL (ref 0–40)

## 2013-12-17 LAB — CBC
HCT: 39.5 % (ref 39.0–52.0)
Hemoglobin: 13.2 g/dL (ref 13.0–17.0)
MCH: 29.7 pg (ref 26.0–34.0)
MCHC: 33.4 g/dL (ref 30.0–36.0)
MCV: 89 fL (ref 78.0–100.0)
Platelets: 351 10*3/uL (ref 150–400)
RBC: 4.44 MIL/uL (ref 4.22–5.81)
RDW: 13.6 % (ref 11.5–15.5)
WBC: 8.5 10*3/uL (ref 4.0–10.5)

## 2013-12-17 LAB — TSH: TSH: 1.479 u[IU]/mL (ref 0.350–4.500)

## 2013-12-17 MED ORDER — INSULIN GLARGINE 100 UNIT/ML SOLOSTAR PEN: 20.0000 [IU] | PEN_INJECTOR | Freq: Every day | SUBCUTANEOUS | Status: DC

## 2013-12-17 MED ORDER — CALCIPOTRIENE 0.005 % EX CREA: TOPICAL_CREAM | Freq: Two times a day (BID) | CUTANEOUS | Status: DC

## 2013-12-17 NOTE — Patient Instructions (Signed)
Start Lantus at 10 units and titrate up by 2 units every 3 days as long as no sugars below 100 Basic Carbohydrate Counting Basic carbohydrate counting is a way to plan meals. It is done by counting the amount of carbohydrate in foods. Foods that have carbohydrates are starches (grains, beans, starchy vegetables) and sweets. Eating carbohydrates increases blood glucose (sugar) levels. People with diabetes use carbohydrate counting to help keep their blood glucose at a normal level.  COUNTING CARBOHYDRATES IN FOODS The first step in counting carbohydrates is to learn how many carbohydrate servings you should have in every meal. A dietitian can plan this for you. After learning the amount of carbohydrates to include in your meal plan, you can start to choose the carbohydrate-containing foods you want to eat.  There are 2 ways to identify the amount of carbohydrates in the foods you eat.  Read the Nutrition Facts panel on food labels. You need 2 pieces of information from the Nutrition Facts panel to count carbohydrates this way:  Serving size.  Total carbohydrate (in grams). Decide how many servings you will be eating. If it is 1 serving, you will be eating the amount of carbohydrate listed on the panel. If you will be eating 2 servings, you will be eating double the amount of carbohydrate listed on the panel.   Learn serving sizes. A serving size of most carbohydrate-containing foods is about 15 grams (g). Listed below are single serving sizes of common carbohydrate-containing foods:  1 slice bread.   cup unsweetened, dry cereal.   cup hot cereal.   cup rice.   cup mashed potatoes.   cup pasta.  1 cup fresh fruit.   cup canned fruit.  1 cup milk (whole, 2%, or skim).   cup starchy vegetables (peas, corn, or potatoes). Counting carbohydrates this way is similar to looking on the Nutrition Facts panel. Decide how many servings you will eat first. Multiply the number of servings  you eat by 15 g. For example, if you have 2 cups of strawberries, you had 2 servings. That means you had 30 g of carbohydrate (2 servings x 15 g = 30 g). CALCULATING CARBOHYDRATES IN A MEAL Sample dinner  3 oz chicken breast.   cup brown rice.   cup corn.  1 cup fat-free milk.  1 cup strawberries with sugar-free whipped topping. Carbohydrate calculation First, identify the foods that contain carbohydrate:  Rice.  Corn.  Milk.  Strawberries. Calculate the number of servings eaten:  2 servings rice.  1 serving corn.  1 serving milk.  1 serving strawberries. Multiply the number of servings by 15 g:  2 servings rice x 15 g = 30 g.  1 serving corn x 15 g = 15 g.  1 serving milk x 15 g = 15 g.  1 serving strawberries x 15 g = 15 g. Add the amounts to find the total carbohydrates eaten: 30 g + 15 g + 15 g + 15 g = 75 g carbohydrate eaten at dinner. Document Released: 11/01/2005 Document Revised: 01/24/2012 Document Reviewed: 09/17/2011 [facility] Patient Information 2014 [place], [place].

## 2013-12-17 NOTE — Telephone Encounter (Signed)
Lab order Labs prior renal, cbc, tsh, hepatic, hgba1c, lipid, psa annual visit shoes off week of

## 2013-12-17 NOTE — Progress Notes (Signed)
Subjective:     Patient ID: Gene [name], male   DOB: Jul 22, 1956, 58 y.o.   MRN: [id number]  CC: 4 mo f/u DM and HTN.  HPI  1. DM- Pt states he had discontinued insulin and increased Glimepiride to twice daily in 07/2013 after losing weight and controlled his blood sugars in summer 2014. Since taking Glimepiride twice daily he has noted loose stools, stomach upset, and frequent flatulence. He would like to restart insulin and cut back on Glimepiride. His blood sugars are tested on Breeze 2 Blood Glucose Monitoring kit at home and run 120s in the mornings before breakfast. He reports 1 episode of lightheadedness and low blood sugar in the summer when using insulin. He denies any episodes similarly since then. Pt has regular dilated eye exams annually. He does not see a podiatrist.   2. Pt denies taking Iron supplement for h/o Iron deficiency anemia. He would like labwork today.  3. Denies chest pains, palpitations, or leg swelling. He does not use his Nitroglycerin. He reports the last time he saw his cardiologist was 2003 for a stent in his lower right leg.   Past Medical History  Diagnosis Date  . History of colonic polyps   . Diabetes mellitus type II   . GERD (gastroesophageal reflux disease)   . Hyperlipidemia   . Hypertension   . CAD (coronary artery disease)   . PVD (peripheral vascular disease)     PTA RLE 12/08 [place]  . Diabetic neuropathy   . Plantar fasciitis   . Chronic pain in right foot   . Diabetes   . Anemia   . Neuromuscular disorder     NEUROPATHY  . Insomnia 03/25/2013    Review of Systems  Constitutional: Negative for fatigue.  Cardiovascular: Negative for chest pain, palpitations and leg swelling.  Gastrointestinal: Positive for abdominal pain and diarrhea. Negative for constipation and blood in stool.  Neurological: Negative for dizziness, weakness, light-headedness and headaches.  Psychiatric/Behavioral: Positive for sleep disturbance.   Current  Outpatient Prescriptions on File Prior to Visit  Medication Sig Dispense Refill  . amLODipine (NORVASC) 10 MG tablet Take 1 tablet (10 mg total) by mouth daily.  30 tablet  0  . Blood Glucose Monitoring Suppl (ONETOUCH VERIO IQ SYSTEM) W/DEVICE KIT 1 kit by Does not apply route as directed. Use to test blood glucose three times daily. Dx: 250.00  1 kit  0  . fish oil-omega-3 fatty acids 1000 MG capsule Take 1 g by mouth at bedtime.        . gabapentin (NEURONTIN) 600 MG tablet Take 1 tablet (600 mg total) by mouth 3 (three) times daily.  90 tablet  5  . glimepiride (AMARYL) 4 MG tablet Take 1 tablet (4 mg total) by mouth daily with breakfast.  90 tablet  0  . Glucose Blood DISK 1 each by In Vitro route 3 (three) times daily.  100 each  3  . insulin glargine (LANTUS SOLOSTAR) 100 UNIT/ML injection Inject 20-30 Units into the skin at bedtime.  5 pen  PRN  . labetalol (NORMODYNE) 200 MG tablet Take 1 tablet (200 mg total) by mouth 2 (two) times daily.  180 tablet  0  . lisinopril (PRINIVIL,ZESTRIL) 40 MG tablet Take 1 tablet (40 mg total) by mouth daily.  30 tablet  0  . metFORMIN (GLUCOPHAGE) 1000 MG tablet TAKE 1 TABLET TWICE DAIL;Y WITH A MEAL  60 tablet  5  . Multiple Vitamins-Minerals (CENTRUM PO) Take  by mouth daily.        . nitroGLYCERIN (NITROSTAT) 0.4 MG SL tablet Place 0.4 mg under the tongue every 5 (five) minutes as needed.        Marland Kitchen omeprazole (PRILOSEC) 40 MG capsule Take 1 capsule (40 mg total) by mouth daily.  90 capsule  4   No current facility-administered medications on file prior to visit.   No Known Allergies  Family History  Problem Relation Age of Onset  . Hyperlipidemia    . Hypertension    . Stroke    . Coronary artery disease Mother   . Hypertension Father   . Colon cancer Neg Hx   . Esophageal cancer Neg Hx    Past Surgical History  Procedure Laterality Date  . Coronary artery bypass graft      200 SVG to diagonal, LIMA to LAD, SVG to LCX  . Other surgical  history      PAD surgery  . Revision total knee arthroplasty     History   Social History  . Marital Status: Married    Spouse Name: N/A    Number of Children: N/A  . Years of Education: N/A   Occupational History  . Not on file.   Social History Main Topics  . Smoking status: Current Some Day Smoker -- 0.50 packs/day for 40 years    Types: Cigarettes  . Smokeless tobacco: Never Used  . Alcohol Use: No  . Drug Use: No     Comment: rare  . Sexual Activity: Not on file   Other Topics Concern  . Not on file   Social History Narrative   Occupation: grocery clerk   Second marriage - 3 children from first marriage   Tobacco Use - Yes.     Alcohol Use - yes(rare)           Pt smokes 4 cigarettes to 1/2 pack per day.     Objective:   Physical Exam  Constitutional: He is oriented to person, place, and time. He appears well-developed and well-nourished. No distress.  HENT:  Head: Normocephalic and atraumatic.  Cardiovascular: Normal rate and regular rhythm.  Exam reveals no gallop and no friction rub.   No murmur heard. Pulmonary/Chest: Effort normal and breath sounds normal.  Neurological: He is alert and oriented to person, place, and time.  Skin: Skin is warm and dry.  Several scaly erythematous to brown macules on anterior lower legs.   Psychiatric: He has a normal mood and affect. His speech is normal and behavior is normal. Judgment and thought content normal.    Filed Vitals:   12/17/13 0822  BP: 140/82  Pulse: 86  Temp: 98.2 F (36.8 C)   BP Readings from Last 3 Encounters:  12/17/13 140/82  08/20/13 128/72  03/21/13 132/78   Lab Results  Component Value Date   HGBA1C 7.7* 08/16/2013   Lab Results  Component Value Date   WBC 8.0 08/16/2013   HGB 12.0* 08/16/2013   HCT 35.1* 08/16/2013   MCV 88.2 08/16/2013   PLT 366 08/16/2013   Lab Results  Component Value Date   CHOL 146 08/16/2013   HDL 28* 08/16/2013   LDLCALC 103* 08/16/2013   TRIG 74  08/16/2013   CHOLHDL 5.2 08/16/2013        Assessment:      1. DM & peripheral neuropathy.  2. HTN, controlled.  3. Insomnia associated with leg cramps, controlled with tylenol pm.  4. GERD, controlled with  omeprazole.  5. Anemia 6. Hyperlipidemia, managed with fish oil capsules.  7. H/o CAD, no current problems. 8. Plaque psoriasis     Plan:      1. DM - Restart Insulin Glargine, 10-20 unit injection hs. Pt was educated about adjusting the insulin dose based on his blood sugar readings and expressed understanding. Pt has used insulin in the past. Drop Glimepiride down to once daily instead of twice daily. Pt should call if his diarrhea continues after lowering this dosage.  2. HTN- no changes.  3. Insomnia with leg cramps- no changes.    4. GERD, no changes.   5. Anemia- Recheck labs today.  6. Hyperlipidemia - no changes.  7. H/o CAD - no changes in management.  8. Plaque psoriasis - Calcipotriene Rx today. Has worked well for pt in past.    Labs today- HgbA1c, CBC/diff, Lipid panel, Renal panel, LFTs, TSH.  Annual/CPE labs at the following visit to include PSA and the above labs.  Annual/CPE in 3 months (05.2015).  Meds ordered this encounter  Medications  . calcipotriene (DOVONEX) 0.005 % cream    Sig: Apply topically 2 (two) times daily.    Dispense:  60 g    Refill:  0  . Insulin Glargine (LANTUS SOLOSTAR) 100 UNIT/ML Solostar Pen    Sig: Inject 20 Units into the skin daily at 10 pm.    Dispense:  15 mL    Refill:  3    02.02.2015  Martinique Hausladen, PA-S. Patient seen, interviewed and examined with student agree with documentation

## 2013-12-17 NOTE — Progress Notes (Signed)
Pre visit review using our clinic review tool, if applicable. No additional management support is needed unless otherwise documented below in the visit note. 

## 2013-12-18 ENCOUNTER — Telehealth: Payer: Self-pay | Admitting: Family Medicine

## 2013-12-18 NOTE — Telephone Encounter (Signed)
Relevant patient education assigned to patient using Emmi. ° °

## 2013-12-19 ENCOUNTER — Encounter: Payer: Self-pay | Admitting: Family Medicine

## 2013-12-19 ENCOUNTER — Telehealth: Payer: Self-pay | Admitting: Family Medicine

## 2013-12-19 NOTE — Telephone Encounter (Signed)
Orders entered as below. 

## 2013-12-19 NOTE — Telephone Encounter (Signed)
Relevant patient education assigned to patient using Emmi. ° °

## 2013-12-20 ENCOUNTER — Telehealth: Payer: Self-pay

## 2013-12-20 ENCOUNTER — Telehealth: Payer: Self-pay | Admitting: Family Medicine

## 2013-12-20 NOTE — Telephone Encounter (Signed)
Relevant patient education assigned to patient using Emmi. ° °

## 2013-12-20 NOTE — Telephone Encounter (Signed)
PA form faxed over for Calcipatrine, forward to nurse

## 2014-01-11 ENCOUNTER — Telehealth: Payer: Self-pay | Admitting: Internal Medicine

## 2014-01-11 NOTE — Telephone Encounter (Signed)
Refill lisinopril 

## 2014-01-21 ENCOUNTER — Other Ambulatory Visit: Payer: Self-pay

## 2014-01-21 MED ORDER — LISINOPRIL 40 MG PO TABS: 40.0000 mg | ORAL_TABLET | Freq: Every day | ORAL | Status: DC

## 2014-01-23 ENCOUNTER — Telehealth: Payer: Self-pay | Admitting: Family Medicine

## 2014-01-23 ENCOUNTER — Other Ambulatory Visit: Payer: Self-pay | Admitting: Gastroenterology

## 2014-01-23 NOTE — Telephone Encounter (Signed)
NEEDS OFFICE VISIT FOR ANY FURTHER REFILLS! 

## 2014-01-23 NOTE — Telephone Encounter (Signed)
Refill labetalol

## 2014-01-25 MED ORDER — LABETALOL HCL 200 MG PO TABS: 200.0000 mg | ORAL_TABLET | Freq: Two times a day (BID) | ORAL | Status: DC

## 2014-02-04 ENCOUNTER — Telehealth: Payer: Self-pay | Admitting: Family Medicine

## 2014-02-04 DIAGNOSIS — E119 Type 2 diabetes mellitus without complications: Secondary | ICD-10-CM

## 2014-02-04 MED ORDER — GLIMEPIRIDE 4 MG PO TABS: 4.0000 mg | ORAL_TABLET | Freq: Every day | ORAL | Status: DC

## 2014-02-04 NOTE — Telephone Encounter (Signed)
Refill-glimepiride

## 2014-02-25 ENCOUNTER — Telehealth: Payer: Self-pay | Admitting: Family Medicine

## 2014-02-25 MED ORDER — AMLODIPINE BESYLATE 10 MG PO TABS: 10.0000 mg | ORAL_TABLET | Freq: Every day | ORAL | Status: DC

## 2014-02-25 NOTE — Telephone Encounter (Signed)
Medication sent to pharmacy  

## 2014-02-25 NOTE — Telephone Encounter (Signed)
Refill amlodipine

## 2014-03-05 ENCOUNTER — Other Ambulatory Visit: Payer: Self-pay | Admitting: Gastroenterology

## 2014-03-05 NOTE — Telephone Encounter (Signed)
NEEDS OFFICE VISIT FOR ANY FURTHER REFILLS! 

## 2014-04-01 ENCOUNTER — Other Ambulatory Visit: Payer: Self-pay | Admitting: Gastroenterology

## 2014-04-09 ENCOUNTER — Other Ambulatory Visit: Payer: Self-pay

## 2014-04-09 ENCOUNTER — Telehealth: Payer: Self-pay | Admitting: Family Medicine

## 2014-04-09 MED ORDER — GABAPENTIN 600 MG PO TABS: 600.0000 mg | ORAL_TABLET | Freq: Three times a day (TID) | ORAL | Status: DC

## 2014-04-09 MED ORDER — BD ULTRA-FINE LANCETS MISC: Status: DC

## 2014-04-09 NOTE — Telephone Encounter (Signed)
Please advise 

## 2014-04-09 NOTE — Telephone Encounter (Signed)
Refill- gabapentin  [facility] [address]

## 2014-04-10 ENCOUNTER — Telehealth: Payer: Self-pay | Admitting: Family Medicine

## 2014-04-10 MED ORDER — OMEPRAZOLE 40 MG PO CPDR: DELAYED_RELEASE_CAPSULE | ORAL | Status: DC

## 2014-04-10 NOTE — Telephone Encounter (Signed)
Refill- omeprazole  [facility] [address]

## 2014-04-10 NOTE — Telephone Encounter (Signed)
Refill sent.

## 2014-04-16 ENCOUNTER — Encounter: Payer: Federal, State, Local not specified - PPO | Admitting: Family Medicine

## 2014-04-23 ENCOUNTER — Telehealth: Payer: Self-pay | Admitting: Family Medicine

## 2014-04-23 LAB — CBC
HCT: 37.3 % — ABNORMAL LOW (ref 39.0–52.0)
Hemoglobin: 13 g/dL (ref 13.0–17.0)
MCH: 30.2 pg (ref 26.0–34.0)
MCHC: 34.9 g/dL (ref 30.0–36.0)
MCV: 86.5 fL (ref 78.0–100.0)
Platelets: 335 10*3/uL (ref 150–400)
RBC: 4.31 MIL/uL (ref 4.22–5.81)
RDW: 14.5 % (ref 11.5–15.5)
WBC: 8.2 10*3/uL (ref 4.0–10.5)

## 2014-04-23 MED ORDER — INSULIN PEN NEEDLE 29G X 12.7MM MISC: Status: DC

## 2014-04-23 NOTE — Telephone Encounter (Signed)
Refill- bd ultra fine pen needles qty 100  [facility] [address]

## 2014-04-24 LAB — TSH: TSH: 1.722 u[IU]/mL (ref 0.350–4.500)

## 2014-04-24 LAB — RENAL FUNCTION PANEL
Albumin: 4.3 g/dL (ref 3.5–5.2)
BUN: 18 mg/dL (ref 6–23)
CO2: 28 mEq/L (ref 19–32)
Calcium: 9.6 mg/dL (ref 8.4–10.5)
Chloride: 102 mEq/L (ref 96–112)
Creat: 0.94 mg/dL (ref 0.50–1.35)
Glucose, Bld: 170 mg/dL — ABNORMAL HIGH (ref 70–99)
Phosphorus: 2.8 mg/dL (ref 2.3–4.6)
Potassium: 4.6 mEq/L (ref 3.5–5.3)
Sodium: 138 mEq/L (ref 135–145)

## 2014-04-24 LAB — LIPID PANEL
Cholesterol: 175 mg/dL (ref 0–200)
HDL: 27 mg/dL — ABNORMAL LOW (ref >39)
LDL Cholesterol: 116 mg/dL — ABNORMAL HIGH (ref 0–99)
Total CHOL/HDL Ratio: 6.5 Ratio
Triglycerides: 162 mg/dL — ABNORMAL HIGH (ref <150)
VLDL: 32 mg/dL (ref 0–40)

## 2014-04-24 LAB — HEPATIC FUNCTION PANEL
ALT: 14 U/L (ref 0–53)
AST: 14 U/L (ref 0–37)
Albumin: 4.3 g/dL (ref 3.5–5.2)
Alkaline Phosphatase: 99 U/L (ref 39–117)
Bilirubin, Direct: 0.1 mg/dL (ref 0.0–0.3)
Indirect Bilirubin: 0.3 mg/dL (ref 0.2–1.2)
Total Bilirubin: 0.4 mg/dL (ref 0.2–1.2)
Total Protein: 6.9 g/dL (ref 6.0–8.3)

## 2014-04-24 LAB — PSA: PSA: 5.38 ng/mL — ABNORMAL HIGH (ref <=4.00)

## 2014-04-24 LAB — HEMOGLOBIN A1C
Hgb A1c MFr Bld: 8.1 % — ABNORMAL HIGH (ref <5.7)
Mean Plasma Glucose: 186 mg/dL — ABNORMAL HIGH (ref <117)

## 2014-04-25 ENCOUNTER — Ambulatory Visit (INDEPENDENT_AMBULATORY_CARE_PROVIDER_SITE_OTHER): Payer: Federal, State, Local not specified - PPO | Admitting: Family Medicine

## 2014-04-25 ENCOUNTER — Encounter: Payer: Self-pay | Admitting: Family Medicine

## 2014-04-25 VITALS — BP 122/80 | HR 81 | Temp 97.0°F | Ht 72.0 in | Wt 213.1 lb

## 2014-04-25 DIAGNOSIS — E785 Hyperlipidemia, unspecified: Secondary | ICD-10-CM

## 2014-04-25 DIAGNOSIS — E119 Type 2 diabetes mellitus without complications: Secondary | ICD-10-CM

## 2014-04-25 DIAGNOSIS — R972 Elevated prostate specific antigen [PSA]: Secondary | ICD-10-CM

## 2014-04-25 DIAGNOSIS — Z0000 Encounter for general adult medical examination without abnormal findings: Secondary | ICD-10-CM

## 2014-04-25 DIAGNOSIS — F17200 Nicotine dependence, unspecified, uncomplicated: Secondary | ICD-10-CM

## 2014-04-25 DIAGNOSIS — I10 Essential (primary) hypertension: Secondary | ICD-10-CM

## 2014-04-25 DIAGNOSIS — L408 Other psoriasis: Secondary | ICD-10-CM

## 2014-04-25 DIAGNOSIS — L409 Psoriasis, unspecified: Secondary | ICD-10-CM

## 2014-04-25 MED ORDER — INSULIN GLARGINE 100 UNIT/ML SOLOSTAR PEN: 30.0000 [IU] | PEN_INJECTOR | Freq: Every day | SUBCUTANEOUS | Status: DC

## 2014-04-25 MED ORDER — ATORVASTATIN CALCIUM 10 MG PO TABS: 10.0000 mg | ORAL_TABLET | Freq: Every day | ORAL | Status: DC

## 2014-04-25 NOTE — Assessment & Plan Note (Addendum)
Well controlled, no changes to meds. Encouraged heart healthy diet such as the DASH diet and exercise as tolerated.  °

## 2014-04-25 NOTE — Progress Notes (Signed)
Pre visit review using our clinic review tool, if applicable. No additional management support is needed unless otherwise documented below in the visit note. 

## 2014-04-25 NOTE — Patient Instructions (Signed)
Prostate-Specific Antigen The prostate-specific antigen (PSA) is a blood test. It is used to help detect early forms of prostate cancer. The test is usually used along with other tests. The test is also used to follow the course of those who already have prostate cancer or who have been treated for prostate cancer. Some factors interfere with the results of the PSA. The factors listed below will either increase or decrease the PSA levels. They are:  Prescriptions used for male baldness.  Some herbs.  Active prostate infection.  Prior instrumentation or urinary catheterization.  Ejaculation up to 2 days prior to testing.  A noncancerous enlargement of the prostate.  Inflammation of the prostate.  Active urinary tract infection. If your test results are elevated, your caregiver will discuss the results with you. Your caregiver will also let you know if more evaluation is needed. PREPARATION FOR TEST No preparation or fasting is necessary. NORMAL FINDINGS Less than 4 ng/mL or Less than 78mcg/L (SI units) Ranges for normal findings may vary among different laboratories and hospitals. You should always check with your caregiver after having lab work or other tests done to discuss the meaning of your test results and whether your values are considered within normal limits. MEANING OF TEST  A normal value means prostate cancer is less likely. The chance of having prostate cancer increases if the value is between 4 ng/mL and 10 ng/mL. However, further testing will be needed. Values above 10 ng/mL indicate that there is a much higher chance of having prostate cancer (if the above situations that raise PSA are not present). Your caregiver will go over your test results with you and discuss the importance of this test. If this value is elevated, your caregiver may recommend further testing or evaluation. OBTAINING THE TEST RESULTS It is your responsibility to obtain your test results. Ask the lab or  department performing the test when and how you will get your results. Document Released: 12/04/2004 Document Revised: 01/24/2012 Document Reviewed: 06/09/2007 [facility] Patient Information 2014 [place].

## 2014-05-03 ENCOUNTER — Encounter: Payer: Self-pay | Admitting: Family Medicine

## 2014-05-03 DIAGNOSIS — R972 Elevated prostate specific antigen [PSA]: Secondary | ICD-10-CM | POA: Insufficient documentation

## 2014-05-03 DIAGNOSIS — L409 Psoriasis, unspecified: Secondary | ICD-10-CM | POA: Insufficient documentation

## 2014-05-03 DIAGNOSIS — Z1211 Encounter for screening for malignant neoplasm of colon: Secondary | ICD-10-CM | POA: Insufficient documentation

## 2014-05-03 DIAGNOSIS — Z0000 Encounter for general adult medical examination without abnormal findings: Secondary | ICD-10-CM | POA: Insufficient documentation

## 2014-05-03 HISTORY — DX: Psoriasis, unspecified: L40.9

## 2014-05-03 HISTORY — DX: Encounter for general adult medical examination without abnormal findings: Z00.00

## 2014-05-03 HISTORY — DX: Elevated prostate specific antigen (PSA): R97.20

## 2014-05-03 NOTE — Progress Notes (Signed)
Patient ID: Gene [name], male   DOB: 11/29/1955, 58 y.o.   MRN: [id number] Gene [name] [id number] [birth year] 05/03/2014      Progress Note-Follow Up  Subjective  Chief Complaint  Chief Complaint  Patient presents with  . Annual Exam    physical    HPI  Patient is a 58 year old male in today for routine medical care. He is in today for annual exam and is recovering from orthopaedic surgery and is slowly improving. No recent illness. His pain persists but responds partially to Ibuprofen and Gabapentin. No recent illness. Denies CP/palp/SOB/HA/congestion/fevers/GI or GU c/o. Taking meds as prescribed. Low sugar of 84 and high of 136. No polyuria or polydipsia  Past Medical History  Diagnosis Date  . History of colonic polyps   . Diabetes mellitus type II   . GERD (gastroesophageal reflux disease)   . Hyperlipidemia   . Hypertension   . CAD (coronary artery disease)   . PVD (peripheral vascular disease)     PTA RLE 12/08 [place]  . Diabetic neuropathy   . Plantar fasciitis   . Chronic pain in right foot   . Diabetes   . Anemia   . Neuromuscular disorder     NEUROPATHY  . Insomnia 03/25/2013  . Preventative health care 05/03/2014    Past Surgical History  Procedure Laterality Date  . Coronary artery bypass graft      200 SVG to diagonal, LIMA to LAD, SVG to LCX  . Other surgical history      PAD surgery  . Revision total knee arthroplasty      Family History  Problem Relation Age of Onset  . Hyperlipidemia    . Hypertension    . Stroke    . Coronary artery disease Mother   . Hypertension Mother   . Hyperlipidemia Father   . Aneurysm Father     brain  . Colon cancer Neg Hx   . Esophageal cancer Neg Hx   . Cancer Maternal Uncle     History   Social History  . Marital Status: Married    Spouse Name: N/A    Number of Children: N/A  . Years of Education: N/A   Occupational History  . Not on file.   Social History Main Topics  . Smoking status:  Current Some Day Smoker -- 0.50 packs/day for 40 years    Types: Cigarettes  . Smokeless tobacco: Never Used  . Alcohol Use: No  . Drug Use: No     Comment: rare  . Sexual Activity: Yes     Comment: lives with wife, works at [employer].   Other Topics Concern  . Not on file   Social History Narrative   Occupation: grocery clerk   Second marriage - 3 children from first marriage   Tobacco Use - Yes.     Alcohol Use - yes(rare)            Current Outpatient Prescriptions on File Prior to Visit  Medication Sig Dispense Refill  . amLODipine (NORVASC) 10 MG tablet Take 1 tablet (10 mg total) by mouth daily.  30 tablet  3  . BD ULTRA-FINE LANCETS lancets Check daily and prn  DX 250.00  100 each  1  . Blood Glucose Monitoring Suppl (ONETOUCH VERIO IQ SYSTEM) W/DEVICE KIT 1 kit by Does not apply route as directed. Use to test blood glucose three times daily. Dx: 250.00  1 kit  0  . calcipotriene (  DOVONEX) 0.005 % cream Apply topically 2 (two) times daily.  60 g  0  . fish oil-omega-3 fatty acids 1000 MG capsule Take 1 g by mouth at bedtime.        . gabapentin (NEURONTIN) 600 MG tablet Take 1 tablet (600 mg total) by mouth 3 (three) times daily.  90 tablet  0  . glimepiride (AMARYL) 4 MG tablet Take 1 tablet (4 mg total) by mouth daily with breakfast.  90 tablet  1  . Glucose Blood DISK 1 each by In Vitro route 3 (three) times daily.  100 each  3  . Insulin Pen Needle 29G X 12.7MM MISC Use daily and prn DX 250.00  100 each  1  . labetalol (NORMODYNE) 200 MG tablet Take 1 tablet (200 mg total) by mouth 2 (two) times daily.  180 tablet  0  . lisinopril (PRINIVIL,ZESTRIL) 40 MG tablet Take 1 tablet (40 mg total) by mouth daily.  30 tablet  3  . metFORMIN (GLUCOPHAGE) 1000 MG tablet TAKE 1 TABLET TWICE DAIL;Y WITH A MEAL  60 tablet  5  . Multiple Vitamins-Minerals (CENTRUM PO) Take by mouth daily.        . nitroGLYCERIN (NITROSTAT) 0.4 MG SL tablet Place 0.4 mg under the tongue every 5  (five) minutes as needed.        Marland Kitchen omeprazole (PRILOSEC) 40 MG capsule TAKE ONE CAPSULE BY MOUTH ONCE DAILY  30 capsule  1   No current facility-administered medications on file prior to visit.    No Known Allergies  Review of Systems  Review of Systems  Constitutional: Negative for fever and malaise/fatigue.  HENT: Negative for congestion.   Eyes: Negative for discharge.  Respiratory: Negative for shortness of breath.   Cardiovascular: Negative for chest pain, palpitations and leg swelling.  Gastrointestinal: Negative for nausea, abdominal pain and diarrhea.  Genitourinary: Negative for dysuria.  Musculoskeletal: Positive for back pain and joint pain. Negative for falls.  Skin: Negative for rash.  Neurological: Positive for tingling. Negative for loss of consciousness and headaches.  Endo/Heme/Allergies: Negative for polydipsia.  Psychiatric/Behavioral: Negative for depression and suicidal ideas. The patient is not nervous/anxious and does not have insomnia.     Objective  BP 122/80  Pulse 81  Temp(Src) 97 F (36.1 C) (Oral)  Ht 6' (1.829 m)  Wt 213 lb 1.3 oz (96.652 kg)  BMI 28.89 kg/m2  SpO2 97%  Physical Exam  Physical Exam  Constitutional: He is oriented to person, place, and time and well-developed, well-nourished, and in no distress. No distress.  HENT:  Head: Normocephalic and atraumatic.  Eyes: Conjunctivae are normal.  Neck: Neck supple. No thyromegaly present.  Cardiovascular: Normal rate, regular rhythm and normal heart sounds.   No murmur heard. Pulmonary/Chest: Effort normal and breath sounds normal. No respiratory distress.  Abdominal: He exhibits no distension and no mass. There is no tenderness.  Musculoskeletal: He exhibits no edema.  Neurological: He is alert and oriented to person, place, and time.  Skin: Skin is warm.  Psychiatric: Memory, affect and judgment normal.    Lab Results  Component Value Date   TSH 1.722 04/23/2014   Lab Results   Component Value Date   WBC 8.2 04/23/2014   HGB 13.0 04/23/2014   HCT 37.3* 04/23/2014   MCV 86.5 04/23/2014   PLT 335 04/23/2014   Lab Results  Component Value Date   CREATININE 0.94 04/23/2014   BUN 18 04/23/2014   NA 138 04/23/2014  K 4.6 04/23/2014   CL 102 04/23/2014   CO2 28 04/23/2014   Lab Results  Component Value Date   ALT 14 04/23/2014   AST 14 04/23/2014   ALKPHOS 99 04/23/2014   BILITOT 0.4 04/23/2014   Lab Results  Component Value Date   CHOL 175 04/23/2014   Lab Results  Component Value Date   HDL 27* 04/23/2014   Lab Results  Component Value Date   LDLCALC 116* 04/23/2014   Lab Results  Component Value Date   TRIG 162* 04/23/2014   Lab Results  Component Value Date   CHOLHDL 6.5 04/23/2014     Assessment & Plan  HYPERTENSION Well controlled, no changes to meds. Encouraged heart healthy diet such as the DASH diet and exercise as tolerated.   DIABETES MELLITUS, TYPE II hgba1c acceptable, minimize simple carbs. Increase exercise as tolerated. Continue current meds  TOBACCO USER Encouraged complete cessation. Discussed need to quit as relates to risk of numerous cancers, cardiac and pulmonary disease as well as neurologic complications. Counseled for greater than 3 minutes  HYPERLIPIDEMIA Tolerating statin, encouraged heart healthy diet, avoid trans fats, minimize simple carbs and saturated fats. Increase exercise as tolerated  Elevated PSA Referred to urology for furhter consideration  Preventative health care Patient encouraged to maintain heart healthy diet, regular exercise, adequate sleep. Consider daily probiotics. Take medications as prescribed

## 2014-05-03 NOTE — Assessment & Plan Note (Signed)
hgba1c acceptable, minimize simple carbs. Increase exercise as tolerated. Continue current meds 

## 2014-05-03 NOTE — Assessment & Plan Note (Signed)
Patient encouraged to maintain heart healthy diet, regular exercise, adequate sleep. Consider daily probiotics. Take medications as prescribed 

## 2014-05-03 NOTE — Assessment & Plan Note (Signed)
Encouraged complete cessation. Discussed need to quit as relates to risk of numerous cancers, cardiac and pulmonary disease as well as neurologic complications. Counseled for greater than 3 minutes 

## 2014-05-03 NOTE — Assessment & Plan Note (Signed)
Referred to urology for furhter consideration

## 2014-05-03 NOTE — Assessment & Plan Note (Signed)
Tolerating statin, encouraged heart healthy diet, avoid trans fats, minimize simple carbs and saturated fats. Increase exercise as tolerated 

## 2014-05-09 ENCOUNTER — Telehealth: Payer: Self-pay | Admitting: Family Medicine

## 2014-05-09 MED ORDER — LABETALOL HCL 200 MG PO TABS: 200.0000 mg | ORAL_TABLET | Freq: Two times a day (BID) | ORAL | Status: DC

## 2014-05-09 MED ORDER — GABAPENTIN 600 MG PO TABS: 600.0000 mg | ORAL_TABLET | Freq: Three times a day (TID) | ORAL | Status: DC

## 2014-05-09 NOTE — Telephone Encounter (Signed)
Labetalol hcl 200 mg tab qty 180 take 1 twice daily send refill to [facility] [place]

## 2014-05-09 NOTE — Telephone Encounter (Signed)
OK to refil Gabapentin same sig, same strength, same number with 2 rf

## 2014-05-09 NOTE — Telephone Encounter (Signed)
Gabapentin 600 mg tab qty 90 take 1 tablet by mouth three times daily

## 2014-05-09 NOTE — Telephone Encounter (Signed)
Refill request for gabapentin Last filled by MD on - 04/09/14 #90 x0 Last Appt:04/25/2014 Next Appt:07/29/2014 Please advise refill?

## 2014-06-13 ENCOUNTER — Telehealth: Payer: Self-pay | Admitting: Family Medicine

## 2014-06-13 MED ORDER — LISINOPRIL 40 MG PO TABS: 40.0000 mg | ORAL_TABLET | Freq: Every day | ORAL | Status: DC

## 2014-06-13 MED ORDER — OMEPRAZOLE 40 MG PO CPDR: DELAYED_RELEASE_CAPSULE | ORAL | Status: DC

## 2014-06-13 NOTE — Telephone Encounter (Signed)
Rx for Lisinopril and Omeprazole sent electronically to [facility] in [facility]

## 2014-06-13 NOTE — Telephone Encounter (Signed)
Lisinopril 40 mg tab qty 30  Omeprazole dr 40 mg cap qty 30  [facility] [place]

## 2014-06-24 ENCOUNTER — Telehealth: Payer: Self-pay | Admitting: Family Medicine

## 2014-06-24 MED ORDER — METFORMIN HCL 1000 MG PO TABS: ORAL_TABLET | ORAL | Status: DC

## 2014-06-24 NOTE — Telephone Encounter (Signed)
Refill- metformin  [facility] [address]

## 2014-07-11 ENCOUNTER — Telehealth: Payer: Self-pay | Admitting: Family Medicine

## 2014-07-11 MED ORDER — AMLODIPINE BESYLATE 10 MG PO TABS: 10.0000 mg | ORAL_TABLET | Freq: Every day | ORAL | Status: DC

## 2014-07-11 NOTE — Telephone Encounter (Signed)
Rx sent to pharmacy. LDM 

## 2014-07-11 NOTE — Telephone Encounter (Signed)
Refill- amlodipine  [facility] [address]

## 2014-07-26 ENCOUNTER — Other Ambulatory Visit: Payer: Self-pay | Admitting: Family Medicine

## 2014-07-26 ENCOUNTER — Other Ambulatory Visit (INDEPENDENT_AMBULATORY_CARE_PROVIDER_SITE_OTHER): Payer: Federal, State, Local not specified - PPO

## 2014-07-26 DIAGNOSIS — E1165 Type 2 diabetes mellitus with hyperglycemia: Secondary | ICD-10-CM

## 2014-07-26 DIAGNOSIS — IMO0001 Reserved for inherently not codable concepts without codable children: Secondary | ICD-10-CM

## 2014-07-26 DIAGNOSIS — I10 Essential (primary) hypertension: Secondary | ICD-10-CM

## 2014-07-26 DIAGNOSIS — E785 Hyperlipidemia, unspecified: Secondary | ICD-10-CM

## 2014-07-26 LAB — LIPID PANEL
Cholesterol: 103 mg/dL (ref 0–200)
HDL: 22 mg/dL — ABNORMAL LOW (ref >39.00)
LDL Cholesterol: 52 mg/dL (ref 0–99)
NonHDL: 81
Total CHOL/HDL Ratio: 5
Triglycerides: 147 mg/dL (ref 0.0–149.0)
VLDL: 29.4 mg/dL (ref 0.0–40.0)

## 2014-07-26 LAB — HEPATIC FUNCTION PANEL
ALT: 13 U/L (ref 0–53)
AST: 14 U/L (ref 0–37)
Albumin: 3.9 g/dL (ref 3.5–5.2)
Alkaline Phosphatase: 94 U/L (ref 39–117)
Bilirubin, Direct: 0 mg/dL (ref 0.0–0.3)
Total Bilirubin: 0.2 mg/dL (ref 0.2–1.2)
Total Protein: 7.2 g/dL (ref 6.0–8.3)

## 2014-07-26 LAB — RENAL FUNCTION PANEL
Albumin: 3.9 g/dL (ref 3.5–5.2)
BUN: 25 mg/dL — ABNORMAL HIGH (ref 6–23)
CO2: 27 mEq/L (ref 19–32)
Calcium: 8.8 mg/dL (ref 8.4–10.5)
Chloride: 101 mEq/L (ref 96–112)
Creatinine, Ser: 1.1 mg/dL (ref 0.4–1.5)
GFR: 74.25 mL/min (ref >60.00)
Glucose, Bld: 147 mg/dL — ABNORMAL HIGH (ref 70–99)
Phosphorus: 2.4 mg/dL (ref 2.3–4.6)
Potassium: 4.7 mEq/L (ref 3.5–5.1)
Sodium: 135 mEq/L (ref 135–145)

## 2014-07-26 LAB — HEMOGLOBIN A1C: Hgb A1c MFr Bld: 8.8 % — ABNORMAL HIGH (ref 4.6–6.5)

## 2014-07-26 LAB — TSH: TSH: 0.64 u[IU]/mL (ref 0.35–4.50)

## 2014-07-29 ENCOUNTER — Encounter: Payer: Self-pay | Admitting: Family Medicine

## 2014-07-29 ENCOUNTER — Ambulatory Visit (INDEPENDENT_AMBULATORY_CARE_PROVIDER_SITE_OTHER): Payer: Federal, State, Local not specified - PPO | Admitting: Family Medicine

## 2014-07-29 VITALS — BP 132/88 | HR 95 | Temp 98.7°F | Ht 72.0 in | Wt 209.2 lb

## 2014-07-29 DIAGNOSIS — R972 Elevated prostate specific antigen [PSA]: Secondary | ICD-10-CM

## 2014-07-29 DIAGNOSIS — E1142 Type 2 diabetes mellitus with diabetic polyneuropathy: Secondary | ICD-10-CM

## 2014-07-29 DIAGNOSIS — F17200 Nicotine dependence, unspecified, uncomplicated: Secondary | ICD-10-CM

## 2014-07-29 DIAGNOSIS — E119 Type 2 diabetes mellitus without complications: Secondary | ICD-10-CM

## 2014-07-29 DIAGNOSIS — M79671 Pain in right foot: Secondary | ICD-10-CM

## 2014-07-29 DIAGNOSIS — K219 Gastro-esophageal reflux disease without esophagitis: Secondary | ICD-10-CM

## 2014-07-29 DIAGNOSIS — E1149 Type 2 diabetes mellitus with other diabetic neurological complication: Secondary | ICD-10-CM

## 2014-07-29 DIAGNOSIS — E785 Hyperlipidemia, unspecified: Secondary | ICD-10-CM

## 2014-07-29 DIAGNOSIS — I10 Essential (primary) hypertension: Secondary | ICD-10-CM

## 2014-07-29 DIAGNOSIS — L409 Psoriasis, unspecified: Secondary | ICD-10-CM

## 2014-07-29 DIAGNOSIS — L408 Other psoriasis: Secondary | ICD-10-CM

## 2014-07-29 DIAGNOSIS — G609 Hereditary and idiopathic neuropathy, unspecified: Secondary | ICD-10-CM

## 2014-07-29 DIAGNOSIS — M79609 Pain in unspecified limb: Secondary | ICD-10-CM

## 2014-07-29 MED ORDER — INSULIN GLARGINE 100 UNIT/ML SOLOSTAR PEN: 30.0000 [IU] | PEN_INJECTOR | Freq: Every day | SUBCUTANEOUS | Status: DC

## 2014-07-29 MED ORDER — GABAPENTIN 600 MG PO TABS: 600.0000 mg | ORAL_TABLET | Freq: Four times a day (QID) | ORAL | Status: DC

## 2014-07-29 NOTE — Patient Instructions (Signed)

## 2014-07-29 NOTE — Progress Notes (Signed)
Patient ID: Gene [name], male   DOB: [birth year], 58 y.o.   MRN: [id number] Gene [name] [id number] 04-22-1956 07/29/2014      Progress Note-Follow Up  Subjective  Chief Complaint  Chief Complaint  Patient presents with  . Follow-up    3 month    HPI Patient is a 58 year old male in today for routine medical care. In today for followup. Generally doing well although he technologist he did not take his blood pressure meds today and has not taken his Lantus in a couple of weeks. He's had some financial concerns but is ready to restart. Acknowledges some polyuria and polydipsia as a result no other acute complaints. His foot fracture is healed and he is been released by his orthopedist to full-time work and is managing adequately. Does acknowledge a mild increase in his peripheral neuropathy lately an extra dose of gabapentin has been helpful. Denies CP/palp/SOB/HA/congestion/fevers/GI or GU c/o. Taking meds as prescribed  Past Medical History  Diagnosis Date  . History of colonic polyps   . Diabetes mellitus type II   . GERD (gastroesophageal reflux disease)   . Hyperlipidemia   . Hypertension   . CAD (coronary artery disease)   . PVD (peripheral vascular disease)     PTA RLE 12/08 [place]  . Diabetic neuropathy   . Plantar fasciitis   . Chronic pain in right foot   . Diabetes   . Anemia   . Neuromuscular disorder     NEUROPATHY  . Insomnia 03/25/2013  . Preventative health care 05/03/2014    Past Surgical History  Procedure Laterality Date  . Coronary artery bypass graft      200 SVG to diagonal, LIMA to LAD, SVG to LCX  . Other surgical history      PAD surgery  . Revision total knee arthroplasty      Family History  Problem Relation Age of Onset  . Hyperlipidemia    . Hypertension    . Stroke    . Coronary artery disease Mother   . Hypertension Mother   . Hyperlipidemia Father   . Aneurysm Father     brain  . Colon cancer Neg Hx   . Esophageal cancer  Neg Hx   . Cancer Maternal Uncle     History   Social History  . Marital Status: Married    Spouse Name: N/A    Number of Children: N/A  . Years of Education: N/A   Occupational History  . Not on file.   Social History Main Topics  . Smoking status: Current Some Day Smoker -- 0.50 packs/day for 40 years    Types: Cigarettes  . Smokeless tobacco: Never Used  . Alcohol Use: No  . Drug Use: No     Comment: rare  . Sexual Activity: Yes     Comment: lives with wife, works at [employer].   Other Topics Concern  . Not on file   Social History Narrative   Occupation: grocery clerk   Second marriage - 3 children from first marriage   Tobacco Use - Yes.     Alcohol Use - yes(rare)            Current Outpatient Prescriptions on File Prior to Visit  Medication Sig Dispense Refill  . amLODipine (NORVASC) 10 MG tablet Take 1 tablet (10 mg total) by mouth daily.  30 tablet  0  . atorvastatin (LIPITOR) 10 MG tablet Take 1 tablet (10 mg total)  by mouth daily.  90 tablet  1  . BD ULTRA-FINE LANCETS lancets Check daily and prn  DX 250.00  100 each  1  . Blood Glucose Monitoring Suppl (ONETOUCH VERIO IQ SYSTEM) W/DEVICE KIT 1 kit by Does not apply route as directed. Use to test blood glucose three times daily. Dx: 250.00  1 kit  0  . calcipotriene (DOVONEX) 0.005 % cream Apply topically 2 (two) times daily.  60 g  0  . fish oil-omega-3 fatty acids 1000 MG capsule Take 1 g by mouth at bedtime.        Marland Kitchen glimepiride (AMARYL) 4 MG tablet Take 1 tablet (4 mg total) by mouth daily with breakfast.  90 tablet  1  . Glucose Blood DISK 1 each by In Vitro route 3 (three) times daily.  100 each  3  . Insulin Pen Needle 29G X 12.7MM MISC Use daily and prn DX 250.00  100 each  1  . labetalol (NORMODYNE) 200 MG tablet Take 1 tablet (200 mg total) by mouth 2 (two) times daily.  180 tablet  0  . lisinopril (PRINIVIL,ZESTRIL) 40 MG tablet Take 1 tablet (40 mg total) by mouth daily.  30 tablet  3  .  metFORMIN (GLUCOPHAGE) 1000 MG tablet TAKE 1 TABLET TWICE DAIL;Y WITH A MEAL  60 tablet  1  . Multiple Vitamins-Minerals (CENTRUM PO) Take by mouth daily.        . nitroGLYCERIN (NITROSTAT) 0.4 MG SL tablet Place 0.4 mg under the tongue every 5 (five) minutes as needed.        Marland Kitchen omeprazole (PRILOSEC) 40 MG capsule TAKE ONE CAPSULE BY MOUTH ONCE DAILY  30 capsule  1   No current facility-administered medications on file prior to visit.    No Known Allergies  Review of Systems  Review of Systems  Constitutional: Negative for fever and malaise/fatigue.  HENT: Negative for congestion.   Eyes: Negative for discharge.  Respiratory: Negative for shortness of breath.   Cardiovascular: Negative for chest pain, palpitations and leg swelling.  Gastrointestinal: Negative for nausea, abdominal pain and diarrhea.  Genitourinary: Negative for dysuria.  Musculoskeletal: Negative for falls.  Skin: Negative for rash.  Neurological: Negative for loss of consciousness and headaches.  Endo/Heme/Allergies: Negative for polydipsia.  Psychiatric/Behavioral: Negative for depression and suicidal ideas. The patient is not nervous/anxious and does not have insomnia.     Objective  BP 168/90  Pulse 95  Temp(Src) 98.7 F (37.1 C) (Oral)  Ht 6' (1.829 m)  Wt 209 lb 3.2 oz (94.892 kg)  BMI 28.37 kg/m2  SpO2 98%  Physical Exam  Physical Exam  Constitutional: He is oriented to person, place, and time and well-developed, well-nourished, and in no distress. No distress.  HENT:  Head: Normocephalic and atraumatic.  Eyes: Conjunctivae are normal.  Neck: Neck supple. No thyromegaly present.  Cardiovascular: Normal rate, regular rhythm and normal heart sounds.   No murmur heard. Pulmonary/Chest: Effort normal and breath sounds normal. No respiratory distress.  Abdominal: He exhibits no distension and no mass. There is no tenderness.  Musculoskeletal: He exhibits no edema.  Neurological: He is alert and  oriented to person, place, and time.  Skin: Skin is warm.  Psychiatric: Memory, affect and judgment normal.    Lab Results  Component Value Date   TSH 0.64 07/26/2014   Lab Results  Component Value Date   WBC 8.2 04/23/2014   HGB 13.0 04/23/2014   HCT 37.3* 04/23/2014   MCV  86.5 04/23/2014   PLT 335 04/23/2014   Lab Results  Component Value Date   CREATININE 1.1 07/26/2014   BUN 25* 07/26/2014   NA 135 07/26/2014   K 4.7 07/26/2014   CL 101 07/26/2014   CO2 27 07/26/2014   Lab Results  Component Value Date   ALT 13 07/26/2014   AST 14 07/26/2014   ALKPHOS 94 07/26/2014   BILITOT 0.2 07/26/2014   Lab Results  Component Value Date   CHOL 103 07/26/2014   Lab Results  Component Value Date   HDL 22.00* 07/26/2014   Lab Results  Component Value Date   LDLCALC 52 07/26/2014   Lab Results  Component Value Date   TRIG 147.0 07/26/2014   Lab Results  Component Value Date   CHOLHDL 5 07/26/2014     Assessment & Plan    DIABETES MELLITUS, TYPE II A1C up, patient has not used any Lantus for a couple weeks for financial reasons. He agrees he can restart. He is also encouraged to increase to 32 units. minimize simple carbs. Increase exercise as tolerated. Continue current meds  GERD Avoid offending foods, start probiotics. Do not eat large meals in late evening and consider raising head of bed.   HYPERLIPIDEMIA Tolerating statin, encouraged heart healthy diet, avoid trans fats, minimize simple carbs and saturated fats. Increase exercise as tolerated  HYPERTENSION Did not take meds this am reports good bp at home. Restart meds and recheck at next visit.  TOBACCO USER On Levothyroxine, continue to monitor  DIABETIC PERIPHERAL NEUROPATHY May increase Gabapentin to qid since worse recently  Elevated PSA Asymptomatic evaluation by urologh unconcerning, will monitor  FOOT PAIN, RIGHT Has been released to work by Dr Sharol Given and is managing full time work again

## 2014-07-29 NOTE — Progress Notes (Signed)
Pre visit review using our clinic review tool, if applicable. No additional management support is needed unless otherwise documented below in the visit note. 

## 2014-07-30 NOTE — Assessment & Plan Note (Signed)
Has been released to work by Dr Sharol Given and is managing full time work again

## 2014-07-30 NOTE — Assessment & Plan Note (Signed)
Asymptomatic evaluation by urologh unconcerning, will monitor

## 2014-07-30 NOTE — Assessment & Plan Note (Signed)
Avoid offending foods, start probiotics. Do not eat large meals in late evening and consider raising head of bed.  

## 2014-07-30 NOTE — Assessment & Plan Note (Signed)
Tolerating statin, encouraged heart healthy diet, avoid trans fats, minimize simple carbs and saturated fats. Increase exercise as tolerated 

## 2014-07-30 NOTE — Assessment & Plan Note (Signed)
Did not take meds this am reports good bp at home. Restart meds and recheck at next visit.

## 2014-07-30 NOTE — Assessment & Plan Note (Signed)
May increase Gabapentin to qid since worse recently

## 2014-07-30 NOTE — Assessment & Plan Note (Signed)
On Levothyroxine, continue to monitor 

## 2014-07-30 NOTE — Assessment & Plan Note (Signed)
A1C up, patient has not used any Lantus for a couple weeks for financial reasons. He agrees he can restart. He is also encouraged to increase to 32 units. minimize simple carbs. Increase exercise as tolerated. Continue current meds

## 2014-08-05 ENCOUNTER — Other Ambulatory Visit: Payer: Self-pay | Admitting: Family Medicine

## 2014-08-20 ENCOUNTER — Other Ambulatory Visit: Payer: Self-pay | Admitting: Family Medicine

## 2014-08-20 NOTE — Telephone Encounter (Signed)
Rx request to pharmacy/SLS  

## 2014-09-13 ENCOUNTER — Other Ambulatory Visit: Payer: Self-pay | Admitting: Family Medicine

## 2014-10-08 ENCOUNTER — Other Ambulatory Visit: Payer: Self-pay | Admitting: Family Medicine

## 2014-10-11 LAB — HM DIABETES EYE EXAM

## 2014-10-24 ENCOUNTER — Other Ambulatory Visit: Payer: Self-pay | Admitting: Family Medicine

## 2014-10-28 ENCOUNTER — Other Ambulatory Visit (INDEPENDENT_AMBULATORY_CARE_PROVIDER_SITE_OTHER): Payer: Federal, State, Local not specified - PPO

## 2014-10-28 DIAGNOSIS — E1142 Type 2 diabetes mellitus with diabetic polyneuropathy: Secondary | ICD-10-CM

## 2014-10-28 DIAGNOSIS — I10 Essential (primary) hypertension: Secondary | ICD-10-CM

## 2014-10-28 DIAGNOSIS — E785 Hyperlipidemia, unspecified: Secondary | ICD-10-CM

## 2014-10-28 LAB — RENAL FUNCTION PANEL
Albumin: 4 g/dL (ref 3.5–5.2)
BUN: 22 mg/dL (ref 6–23)
CO2: 28 meq/L (ref 19–32)
Calcium: 9.1 mg/dL (ref 8.4–10.5)
Chloride: 101 meq/L (ref 96–112)
Creatinine, Ser: 1.2 mg/dL (ref 0.4–1.5)
GFR: 67.64 mL/min
Glucose, Bld: 86 mg/dL (ref 70–99)
Phosphorus: 3.6 mg/dL (ref 2.3–4.6)
Potassium: 4.8 meq/L (ref 3.5–5.1)
Sodium: 136 meq/L (ref 135–145)

## 2014-10-28 LAB — LIPID PANEL
Cholesterol: 124 mg/dL (ref 0–200)
HDL: 25.3 mg/dL — ABNORMAL LOW (ref >39.00)
LDL Cholesterol: 69 mg/dL (ref 0–99)
NonHDL: 98.7
Total CHOL/HDL Ratio: 5
Triglycerides: 149 mg/dL (ref 0.0–149.0)
VLDL: 29.8 mg/dL (ref 0.0–40.0)

## 2014-10-28 LAB — HEPATIC FUNCTION PANEL
ALT: 13 U/L (ref 0–53)
AST: 15 U/L (ref 0–37)
Albumin: 4 g/dL (ref 3.5–5.2)
Alkaline Phosphatase: 91 U/L (ref 39–117)
Bilirubin, Direct: 0.1 mg/dL (ref 0.0–0.3)
Total Bilirubin: 0.2 mg/dL (ref 0.2–1.2)
Total Protein: 7.1 g/dL (ref 6.0–8.3)

## 2014-10-28 LAB — CBC WITH DIFFERENTIAL/PLATELET
Basophils Absolute: 0.1 10*3/uL (ref 0.0–0.1)
Basophils Relative: 0.5 % (ref 0.0–3.0)
Eosinophils Absolute: 0.2 10*3/uL (ref 0.0–0.7)
Eosinophils Relative: 1.4 % (ref 0.0–5.0)
HCT: 36.5 % — ABNORMAL LOW (ref 39.0–52.0)
Hemoglobin: 11.8 g/dL — ABNORMAL LOW (ref 13.0–17.0)
Lymphocytes Relative: 28.9 % (ref 12.0–46.0)
Lymphs Abs: 3.2 10*3/uL (ref 0.7–4.0)
MCHC: 32.2 g/dL (ref 30.0–36.0)
MCV: 90.4 fl (ref 78.0–100.0)
Monocytes Absolute: 0.7 10*3/uL (ref 0.1–1.0)
Monocytes Relative: 6.7 % (ref 3.0–12.0)
Neutro Abs: 6.9 10*3/uL (ref 1.4–7.7)
Neutrophils Relative %: 62.5 % (ref 43.0–77.0)
Platelets: 385 10*3/uL (ref 150.0–400.0)
RBC: 4.04 Mil/uL — ABNORMAL LOW (ref 4.22–5.81)
RDW: 14.3 % (ref 11.5–15.5)
WBC: 11.1 10*3/uL — ABNORMAL HIGH (ref 4.0–10.5)

## 2014-10-28 LAB — HEMOGLOBIN A1C: Hgb A1c MFr Bld: 8.1 % — ABNORMAL HIGH (ref 4.6–6.5)

## 2014-10-28 LAB — TSH: TSH: 2.14 u[IU]/mL (ref 0.35–4.50)

## 2014-11-01 ENCOUNTER — Encounter: Payer: Self-pay | Admitting: Family Medicine

## 2014-11-01 ENCOUNTER — Ambulatory Visit (INDEPENDENT_AMBULATORY_CARE_PROVIDER_SITE_OTHER): Payer: Federal, State, Local not specified - PPO | Admitting: *Deleted

## 2014-11-01 ENCOUNTER — Ambulatory Visit (INDEPENDENT_AMBULATORY_CARE_PROVIDER_SITE_OTHER): Payer: Federal, State, Local not specified - PPO | Admitting: Family Medicine

## 2014-11-01 VITALS — BP 132/70 | HR 79 | Temp 98.2°F | Wt 210.4 lb

## 2014-11-01 DIAGNOSIS — E785 Hyperlipidemia, unspecified: Secondary | ICD-10-CM

## 2014-11-01 DIAGNOSIS — E1142 Type 2 diabetes mellitus with diabetic polyneuropathy: Secondary | ICD-10-CM

## 2014-11-01 DIAGNOSIS — F17200 Nicotine dependence, unspecified, uncomplicated: Secondary | ICD-10-CM

## 2014-11-01 DIAGNOSIS — Z720 Tobacco use: Secondary | ICD-10-CM

## 2014-11-01 DIAGNOSIS — I10 Essential (primary) hypertension: Secondary | ICD-10-CM

## 2014-11-01 DIAGNOSIS — Z23 Encounter for immunization: Secondary | ICD-10-CM

## 2014-11-01 DIAGNOSIS — G609 Hereditary and idiopathic neuropathy, unspecified: Secondary | ICD-10-CM

## 2014-11-01 DIAGNOSIS — E669 Obesity, unspecified: Secondary | ICD-10-CM

## 2014-11-01 DIAGNOSIS — E119 Type 2 diabetes mellitus without complications: Secondary | ICD-10-CM

## 2014-11-01 DIAGNOSIS — E1169 Type 2 diabetes mellitus with other specified complication: Secondary | ICD-10-CM

## 2014-11-01 DIAGNOSIS — E782 Mixed hyperlipidemia: Secondary | ICD-10-CM

## 2014-11-01 DIAGNOSIS — L409 Psoriasis, unspecified: Secondary | ICD-10-CM

## 2014-11-01 MED ORDER — GLIMEPIRIDE 4 MG PO TABS: 4.0000 mg | ORAL_TABLET | Freq: Every day | ORAL | Status: DC

## 2014-11-01 MED ORDER — GABAPENTIN 600 MG PO TABS: 600.0000 mg | ORAL_TABLET | Freq: Four times a day (QID) | ORAL | Status: DC

## 2014-11-01 MED ORDER — METFORMIN HCL 1000 MG PO TABS: 1000.0000 mg | ORAL_TABLET | Freq: Two times a day (BID) | ORAL | Status: DC

## 2014-11-01 MED ORDER — OMEPRAZOLE 40 MG PO CPDR: 40.0000 mg | DELAYED_RELEASE_CAPSULE | Freq: Every day | ORAL | Status: DC

## 2014-11-01 MED ORDER — AMLODIPINE BESYLATE 10 MG PO TABS: 10.0000 mg | ORAL_TABLET | Freq: Every day | ORAL | Status: DC

## 2014-11-01 MED ORDER — LABETALOL HCL 200 MG PO TABS: 200.0000 mg | ORAL_TABLET | Freq: Two times a day (BID) | ORAL | Status: DC

## 2014-11-01 MED ORDER — INSULIN GLARGINE 100 UNIT/ML SOLOSTAR PEN: 32.0000 [IU] | PEN_INJECTOR | Freq: Every day | SUBCUTANEOUS | Status: DC

## 2014-11-01 MED ORDER — LISINOPRIL 40 MG PO TABS: 40.0000 mg | ORAL_TABLET | Freq: Every day | ORAL | Status: DC

## 2014-11-01 NOTE — Patient Instructions (Signed)
Start a probiotic daily such as Digestive Advantage or [employer]    Irritable Bowel Syndrome Irritable bowel syndrome (IBS) is caused by a disturbance of normal bowel function and is a common digestive disorder. You may also hear this condition called spastic colon, mucous colitis, and irritable colon. There is no cure for IBS. However, symptoms often gradually improve or disappear with a good diet, stress management, and medicine. This condition usually appears in late adolescence or early adulthood. Women develop it twice as often as men. CAUSES  After food has been digested and absorbed in the small intestine, waste material is moved into the large intestine, or colon. In the colon, water and salts are absorbed from the undigested products coming from the small intestine. The remaining residue, or fecal material, is held for elimination. Under normal circumstances, gentle, rhythmic contractions of the bowel walls push the fecal material along the colon toward the rectum. In IBS, however, these contractions are irregular and poorly coordinated. The fecal material is either retained too long, resulting in constipation, or expelled too soon, producing diarrhea. SIGNS AND SYMPTOMS  The most common symptom of IBS is abdominal pain. It is often in the lower left side of the abdomen, but it may occur anywhere in the abdomen. The pain comes from spasms of the bowel muscles happening too much and from the buildup of gas and fecal material in the colon. This pain:  Can range from sharp abdominal cramps to a dull, continuous ache.  Often worsens soon after eating.  Is often relieved by having a bowel movement or passing gas. Abdominal pain is usually accompanied by constipation, but it may also produce diarrhea. The diarrhea often occurs right after a meal or upon waking up in the morning. The stools are often soft, watery, and flecked with mucus. Other symptoms of IBS  include:  Bloating.  Loss of appetite.  Heartburn.  Backache.  Dull pain in the arms or shoulders.  Nausea.  Burping.  Vomiting.  Gas. IBS may also cause symptoms that are unrelated to the digestive system, such as:  Fatigue.  Headaches.  Anxiety.  Shortness of breath.  Trouble concentrating.  Dizziness. These symptoms tend to come and go. DIAGNOSIS  The symptoms of IBS may seem like symptoms of other, more serious digestive disorders. Your health care provider may want to perform tests to exclude these disorders.  TREATMENT Many medicines are available to help correct bowel function or relieve bowel spasms and abdominal pain. Among the medicines available are:  Laxatives for severe constipation and to help restore normal bowel habits.  Specific antidiarrheal medicines to treat severe or lasting diarrhea.  Antispasmodic agents to relieve intestinal cramps. Your health care provider may also decide to treat you with a mild tranquilizer or sedative during unusually stressful periods in your life. Your health care provider may also prescribe antidepressant medicine. The use of this medicine has been shown to reduce pain and other symptoms of IBS. Remember that if any medicine is prescribed for you, you should take it exactly as directed. Make sure your health care provider knows how well it worked for you. HOME CARE INSTRUCTIONS   Take all medicines as directed by your health care provider.  Avoid foods that are high in fat or oils, such as heavy cream, butter, frankfurters, sausage, and other fatty meats.  Avoid foods that make you go to the bathroom, such as fruit, fruit juice, and dairy products.  Cut out carbonated drinks, chewing gum,  and "gassy" foods such as beans and cabbage. This may help relieve bloating and burping.  Eat foods with bran, and drink plenty of liquids with the bran foods. This helps relieve constipation.  Keep track of what foods seem to  bring on your symptoms.  Avoid emotionally charged situations or circumstances that produce anxiety.  Start or continue exercising.  Get plenty of rest and sleep. Document Released: 11/01/2005 Document Revised: 11/06/2013 Document Reviewed: 06/21/2008 [facility] Patient Information 2015 [place], [place]. This information is not intended to replace advice given to you by your health care provider. Make sure you discuss any questions you have with your health care provider.

## 2014-11-01 NOTE — Progress Notes (Signed)
Gene [name]  [id number] Dec 19, 1955 11/01/2014      Progress Note-Follow Up  Subjective  Chief Complaint  Chief Complaint  Patient presents with  . Follow-up    3 mos    HPI  Patient is a 58 y.o. male in today for routine medical care. Patient notes a recent gastroenteritis. Roughly 3 weeks ago he had an episode of gaseousness and diarrhea but that has resolved. No other recent illness. Seen by his ophthalmologist about 2 weeks ago and no new disease was identified. He notes his gastroenteritis symptoms resolved when he quit caffeine and artificial sweeteners. Denies CP/palp/SOB/HA/congestion/fevers or GU c/o. Taking meds as prescribed  Past Medical History  Diagnosis Date  . History of colonic polyps   . Diabetes mellitus type II   . GERD (gastroesophageal reflux disease)   . Hyperlipidemia   . Hypertension   . CAD (coronary artery disease)   . PVD (peripheral vascular disease)     PTA RLE 12/08 [place]  . Diabetic neuropathy   . Plantar fasciitis   . Chronic pain in right foot   . Diabetes   . Anemia   . Neuromuscular disorder     NEUROPATHY  . Insomnia 03/25/2013  . Preventative health care 05/03/2014    Past Surgical History  Procedure Laterality Date  . Coronary artery bypass graft      200 SVG to diagonal, LIMA to LAD, SVG to LCX  . Other surgical history      PAD surgery  . Revision total knee arthroplasty      Family History  Problem Relation Age of Onset  . Hyperlipidemia    . Hypertension    . Stroke    . Coronary artery disease Mother   . Hypertension Mother   . Hyperlipidemia Father   . Aneurysm Father     brain  . Colon cancer Neg Hx   . Esophageal cancer Neg Hx   . Cancer Maternal Uncle     History   Social History  . Marital Status: Married    Spouse Name: N/A    Number of Children: N/A  . Years of Education: N/A   Occupational History  . Not on file.   Social History Main Topics  . Smoking status: Current Some Day  Smoker -- 0.50 packs/day for 40 years    Types: Cigarettes  . Smokeless tobacco: Never Used  . Alcohol Use: No  . Drug Use: No     Comment: rare  . Sexual Activity: Yes     Comment: lives with wife, works at [employer].   Other Topics Concern  . Not on file   Social History Narrative   Occupation: grocery clerk   Second marriage - 3 children from first marriage   Tobacco Use - Yes.     Alcohol Use - yes(rare)            Current Outpatient Prescriptions on File Prior to Visit  Medication Sig Dispense Refill  . amLODipine (NORVASC) 10 MG tablet TAKE ONE TABLET BY MOUTH ONCE DAILY 30 tablet 2  . atorvastatin (LIPITOR) 10 MG tablet Take 1 tablet (10 mg total) by mouth daily. 90 tablet 1  . BD ULTRA-FINE LANCETS lancets Check daily and prn  DX 250.00 100 each 1  . calcipotriene (DOVONEX) 0.005 % cream Apply topically 2 (two) times daily. 60 g 0  . fish oil-omega-3 fatty acids 1000 MG capsule Take 1 g by mouth at bedtime.      Marland Kitchen  gabapentin (NEURONTIN) 600 MG tablet Take 1 tablet (600 mg total) by mouth 4 (four) times daily. 120 tablet 2  . glimepiride (AMARYL) 4 MG tablet Take 1 tablet (4 mg total) by mouth daily with breakfast. 90 tablet 1  . Glucose Blood DISK 1 each by In Vitro route 3 (three) times daily. 100 each 3  . Insulin Glargine (LANTUS SOLOSTAR) 100 UNIT/ML Solostar Pen Inject 30 Units into the skin daily at 10 pm. 15 mL 3  . Insulin Pen Needle 29G X 12.7MM MISC Use daily and prn DX 250.00 100 each 1  . labetalol (NORMODYNE) 200 MG tablet TAKE ONE TABLET BY MOUTH TWICE DAILY 180 tablet 0  . lisinopril (PRINIVIL,ZESTRIL) 40 MG tablet TAKE ONE TABLET BY MOUTH ONCE DAILY 30 tablet 0  . metFORMIN (GLUCOPHAGE) 1000 MG tablet TAKE ONE TABLET BY MOUTH TWICE DAILY WITH MEALS 60 tablet 0  . Multiple Vitamins-Minerals (CENTRUM PO) Take by mouth daily.      . nitroGLYCERIN (NITROSTAT) 0.4 MG SL tablet Place 0.4 mg under the tongue every 5 (five) minutes as needed.      Marland Kitchen  omeprazole (PRILOSEC) 40 MG capsule TAKE ONE CAPSULE BY MOUTH ONCE DAILY 30 capsule 2   No current facility-administered medications on file prior to visit.    No Known Allergies  Review of Systems  Review of Systems  Constitutional: Negative for fever and malaise/fatigue.  HENT: Negative for congestion.   Eyes: Negative for discharge.  Respiratory: Negative for shortness of breath.   Cardiovascular: Negative for chest pain, palpitations and leg swelling.  Gastrointestinal: Negative for nausea, abdominal pain and diarrhea.  Genitourinary: Negative for dysuria.  Musculoskeletal: Negative for falls.  Skin: Negative for rash.  Neurological: Negative for loss of consciousness and headaches.  Endo/Heme/Allergies: Negative for polydipsia.  Psychiatric/Behavioral: Negative for depression and suicidal ideas. The patient is not nervous/anxious and does not have insomnia.     Objective  BP 132/70 mmHg  Pulse 79  Temp(Src) 98.2 F (36.8 C) (Oral)  Wt 210 lb 6.4 oz (95.437 kg)  SpO2 100%  Physical Exam  Physical Exam  Lab Results  Component Value Date   TSH 2.14 10/28/2014   Lab Results  Component Value Date   WBC 11.1* 10/28/2014   HGB 11.8* 10/28/2014   HCT 36.5* 10/28/2014   MCV 90.4 10/28/2014   PLT 385.0 10/28/2014   Lab Results  Component Value Date   CREATININE 1.2 10/28/2014   BUN 22 10/28/2014   NA 136 10/28/2014   K 4.8 10/28/2014   CL 101 10/28/2014   CO2 28 10/28/2014   Lab Results  Component Value Date   ALT 13 10/28/2014   AST 15 10/28/2014   ALKPHOS 91 10/28/2014   BILITOT 0.2 10/28/2014   Lab Results  Component Value Date   CHOL 124 10/28/2014   Lab Results  Component Value Date   HDL 25.30* 10/28/2014   Lab Results  Component Value Date   LDLCALC 69 10/28/2014   Lab Results  Component Value Date   TRIG 149.0 10/28/2014   Lab Results  Component Value Date   CHOLHDL 5 10/28/2014     Assessment & Plan   Essential  hypertension Well controlled, no changes to meds. Encouraged heart healthy diet such as the DASH diet and exercise as tolerated. Has been out of Amlodipine for past week. Will restart  Diabetes mellitus type 2 in obese hgba1c unacceptable, minimize simple carbs. Increase exercise as tolerated. Continue current meds but increase  lantus to 32 units and then as directed.  Hyperlipidemia, mixed Tolerating statin, encouraged heart healthy diet, avoid trans fats, minimize simple carbs and saturated fats. Increase exercise as tolerated  TOBACCO USER Encouraged complete cessation. Discussed need to quit as relates to risk of numerous cancers, cardiac and pulmonary disease as well as neurologic complications. Counseled for greater than 3 minutes

## 2014-11-01 NOTE — Progress Notes (Signed)
Pre visit review using our clinic review tool, if applicable. No additional management support is needed unless otherwise documented below in the visit note. 

## 2014-11-01 NOTE — Assessment & Plan Note (Addendum)
Well controlled, no changes to meds. Encouraged heart healthy diet such as the DASH diet and exercise as tolerated. Has been out of Amlodipine for past week. Will restart

## 2014-11-04 ENCOUNTER — Encounter: Payer: Self-pay | Admitting: Family Medicine

## 2014-11-04 NOTE — Assessment & Plan Note (Signed)
Encouraged complete cessation. Discussed need to quit as relates to risk of numerous cancers, cardiac and pulmonary disease as well as neurologic complications. Counseled for greater than 3 minutes 

## 2014-11-04 NOTE — Assessment & Plan Note (Signed)
hgba1c unacceptable, minimize simple carbs. Increase exercise as tolerated. Continue current meds but increase lantus to 32 units and then as directed.

## 2014-11-04 NOTE — Assessment & Plan Note (Signed)
Tolerating statin, encouraged heart healthy diet, avoid trans fats, minimize simple carbs and saturated fats. Increase exercise as tolerated 

## 2014-12-05 ENCOUNTER — Other Ambulatory Visit: Payer: Self-pay

## 2014-12-05 DIAGNOSIS — E785 Hyperlipidemia, unspecified: Secondary | ICD-10-CM

## 2014-12-05 MED ORDER — ATORVASTATIN CALCIUM 10 MG PO TABS
10.0000 mg | ORAL_TABLET | Freq: Every day | ORAL | Status: DC
Start: 2014-12-05 — End: 2015-08-29

## 2014-12-22 ENCOUNTER — Other Ambulatory Visit: Payer: Self-pay | Admitting: Family Medicine

## 2015-01-31 ENCOUNTER — Other Ambulatory Visit: Payer: Federal, State, Local not specified - PPO

## 2015-01-31 ENCOUNTER — Ambulatory Visit: Payer: Federal, State, Local not specified - PPO | Admitting: Family Medicine

## 2015-02-03 ENCOUNTER — Ambulatory Visit: Payer: Federal, State, Local not specified - PPO | Admitting: Family Medicine

## 2015-05-26 ENCOUNTER — Other Ambulatory Visit: Payer: Self-pay | Admitting: Family Medicine

## 2015-05-28 ENCOUNTER — Other Ambulatory Visit: Payer: Self-pay | Admitting: Family Medicine

## 2015-06-16 ENCOUNTER — Other Ambulatory Visit: Payer: Self-pay | Admitting: Family Medicine

## 2015-07-23 ENCOUNTER — Other Ambulatory Visit: Payer: Self-pay | Admitting: Family Medicine

## 2015-07-23 NOTE — Telephone Encounter (Signed)
LOV and labs 11/01/14

## 2015-08-21 ENCOUNTER — Other Ambulatory Visit: Payer: Self-pay | Admitting: Family Medicine

## 2015-08-21 DIAGNOSIS — L409 Psoriasis, unspecified: Secondary | ICD-10-CM

## 2015-08-21 MED ORDER — CALCIPOTRIENE 0.005 % EX CREA: TOPICAL_CREAM | Freq: Two times a day (BID) | CUTANEOUS | Status: DC

## 2015-08-29 ENCOUNTER — Other Ambulatory Visit: Payer: Self-pay | Admitting: Family Medicine

## 2015-09-07 ENCOUNTER — Emergency Department (HOSPITAL_BASED_OUTPATIENT_CLINIC_OR_DEPARTMENT_OTHER): Payer: Federal, State, Local not specified - PPO

## 2015-09-07 ENCOUNTER — Encounter (HOSPITAL_BASED_OUTPATIENT_CLINIC_OR_DEPARTMENT_OTHER): Payer: Self-pay | Admitting: Emergency Medicine

## 2015-09-07 ENCOUNTER — Emergency Department (HOSPITAL_BASED_OUTPATIENT_CLINIC_OR_DEPARTMENT_OTHER)
Admission: EM | Admit: 2015-09-07 | Discharge: 2015-09-07 | Disposition: A | Discharge status: Home / Self Care | Payer: Federal, State, Local not specified - PPO | Attending: Emergency Medicine | Admitting: Emergency Medicine

## 2015-09-07 DIAGNOSIS — M79672 Pain in left foot: Secondary | ICD-10-CM | POA: Diagnosis present

## 2015-09-07 DIAGNOSIS — Z79899 Other long term (current) drug therapy: Secondary | ICD-10-CM | POA: Diagnosis not present

## 2015-09-07 DIAGNOSIS — E1140 Type 2 diabetes mellitus with diabetic neuropathy, unspecified: Secondary | ICD-10-CM | POA: Diagnosis not present

## 2015-09-07 DIAGNOSIS — Z951 Presence of aortocoronary bypass graft: Secondary | ICD-10-CM | POA: Insufficient documentation

## 2015-09-07 DIAGNOSIS — G709 Myoneural disorder, unspecified: Secondary | ICD-10-CM | POA: Insufficient documentation

## 2015-09-07 DIAGNOSIS — M722 Plantar fascial fibromatosis: Secondary | ICD-10-CM | POA: Insufficient documentation

## 2015-09-07 DIAGNOSIS — K219 Gastro-esophageal reflux disease without esophagitis: Secondary | ICD-10-CM | POA: Insufficient documentation

## 2015-09-07 DIAGNOSIS — Z86010 Personal history of colonic polyps: Secondary | ICD-10-CM | POA: Diagnosis not present

## 2015-09-07 DIAGNOSIS — E785 Hyperlipidemia, unspecified: Secondary | ICD-10-CM | POA: Insufficient documentation

## 2015-09-07 DIAGNOSIS — I10 Essential (primary) hypertension: Secondary | ICD-10-CM | POA: Insufficient documentation

## 2015-09-07 DIAGNOSIS — Z794 Long term (current) use of insulin: Secondary | ICD-10-CM | POA: Diagnosis not present

## 2015-09-07 DIAGNOSIS — I2510 Atherosclerotic heart disease of native coronary artery without angina pectoris: Secondary | ICD-10-CM | POA: Diagnosis not present

## 2015-09-07 DIAGNOSIS — Z862 Personal history of diseases of the blood and blood-forming organs and certain disorders involving the immune mechanism: Secondary | ICD-10-CM | POA: Diagnosis not present

## 2015-09-07 DIAGNOSIS — Z720 Tobacco use: Secondary | ICD-10-CM | POA: Diagnosis not present

## 2015-09-07 DIAGNOSIS — S93699A Other sprain of unspecified foot, initial encounter: Secondary | ICD-10-CM

## 2015-09-07 MED ORDER — TRAMADOL HCL 50 MG PO TABS
100.0000 mg | ORAL_TABLET | Freq: Four times a day (QID) | ORAL | Status: DC | PRN
Start: 2015-09-07 — End: 2015-11-03

## 2015-09-07 NOTE — ED Provider Notes (Signed)
CSN: [id number]     Arrival date & time 09/07/15  1223 History   First MD Initiated Contact with Patient 09/07/15 1323     Chief Complaint  Patient presents with  . Foot Pain     (Consider location/radiation/quality/duration/timing/severity/associated sxs/prior Treatment) HPI Patient was walking down a slope until yesterday and he had his left foot planted and it did not elevate as expected during the walking motion. He reports he got a sudden severe pain in the sole of his foot towards his heel. He reports since that time he has a lot of pain with direct weightbearing. He reports that he does not have weight on it is not very painful. He reports he can move it in all different directions the bones don't seem to hurt. Past Medical History  Diagnosis Date  . History of colonic polyps   . Diabetes mellitus type II   . GERD (gastroesophageal reflux disease)   . Hyperlipidemia   . Hypertension   . CAD (coronary artery disease)   . PVD (peripheral vascular disease) ([place])     PTA RLE 12/08 [place]  . Diabetic neuropathy ([place])   . Plantar fasciitis   . Chronic pain in right foot   . Diabetes ([place])   . Anemia   . Neuromuscular disorder ([place])     NEUROPATHY  . Insomnia 03/25/2013  . Preventative health care 05/03/2014   Past Surgical History  Procedure Laterality Date  . Coronary artery bypass graft      200 SVG to diagonal, LIMA to LAD, SVG to LCX  . Other surgical history      PAD surgery  . Revision total knee arthroplasty     Family History  Problem Relation Age of Onset  . Hyperlipidemia    . Hypertension    . Stroke    . Coronary artery disease Mother   . Hypertension Mother   . Hyperlipidemia Father   . Aneurysm Father     brain  . Colon cancer Neg Hx   . Esophageal cancer Neg Hx   . Cancer Maternal Uncle    Social History  Substance Use Topics  . Smoking status: Current Some Day Smoker -- 0.50 packs/day for 40 years    Types: Cigarettes  . Smokeless tobacco:  Never Used  . Alcohol Use: No    Review of Systems Constitutional: No recent fever chills or general illness.   Allergies  Review of patient's allergies indicates no known allergies.  Home Medications   Prior to Admission medications   Medication Sig Start Date End Date Taking? Authorizing Provider  amLODipine (NORVASC) 10 MG tablet TAKE ONE TABLET BY MOUTH ONCE DAILY 08/29/15  Yes Mosie Lukes, MD  atorvastatin (LIPITOR) 10 MG tablet TAKE ONE TABLET BY MOUTH ONCE DAILY 08/29/15  Yes Mosie Lukes, MD  BD ULTRA-FINE LANCETS lancets Check daily and prn  DX 250.00 04/09/14  Yes Mosie Lukes, MD  calcipotriene (DOVONEX) 0.005 % cream Apply topically 2 (two) times daily. Patient needs appointment for further refills. 08/21/15  Yes Mosie Lukes, MD  fish oil-omega-3 fatty acids 1000 MG capsule Take 1 g by mouth at bedtime.     Yes Historical Provider, MD  gabapentin (NEURONTIN) 600 MG tablet TAKE ONE TABLET BY MOUTH 4 TIMES DAILY 07/23/15  Yes Mosie Lukes, MD  glimepiride (AMARYL) 4 MG tablet TAKE ONE TABLET BY MOUTH ONCE DAILY WITH BREAKFAST 08/29/15  Yes Mosie Lukes, MD  Glucose Blood DISK  1 each by In Vitro route 3 (three) times daily. 05/23/12  Yes Burnice Logan, MD  Insulin Glargine (LANTUS SOLOSTAR) 100 UNIT/ML Solostar Pen Inject 32 Units into the skin daily at 10 pm. 11/01/14  Yes Mosie Lukes, MD  Insulin Pen Needle 29G X 12.7MM MISC Use daily and prn DX 250.00 04/23/14  Yes Mosie Lukes, MD  labetalol (NORMODYNE) 200 MG tablet TAKE ONE TABLET BY MOUTH TWICE DAILY 05/28/15  Yes Mosie Lukes, MD  lisinopril (PRINIVIL,ZESTRIL) 40 MG tablet TAKE ONE TABLET BY MOUTH ONCE DAILY 08/29/15  Yes Mosie Lukes, MD  metFORMIN (GLUCOPHAGE) 1000 MG tablet TAKE ONE TABLET BY MOUTH TWICE DAILY WITH MEALS 08/29/15  Yes Mosie Lukes, MD  Multiple Vitamins-Minerals (CENTRUM PO) Take by mouth daily.     Yes Historical Provider, MD  nitroGLYCERIN (NITROSTAT) 0.4 MG SL tablet Place 0.4  mg under the tongue every 5 (five) minutes as needed.     Yes Historical Provider, MD  omeprazole (PRILOSEC) 40 MG capsule TAKE ONE CAPSULE BY MOUTH ONCE DAILY 07/23/15  Yes Mosie Lukes, MD  RELION PEN NEEDLES 29G X 12MM MISC USE DAILY IN THE PM AS DIRECTED 12/23/14  Yes Mosie Lukes, MD  traMADol (ULTRAM) 50 MG tablet Take 2 tablets (100 mg total) by mouth every 6 (six) hours as needed. 09/07/15   Charlesetta Shanks, MD   BP 128/71 mmHg  Pulse 73  Temp(Src) 97.9 F (36.6 C) (Oral)  Resp 16  Ht 5\' 11"  (1.803 m)  Wt 208 lb (94.348 kg)  BMI 29.02 kg/m2  SpO2 100% Physical Exam  Constitutional: He is oriented to person, place, and time. He appears well-developed and well-nourished. No distress.  Pulmonary/Chest: Effort normal.  Musculoskeletal:  Left foot does not have any overt swelling. There is no deformity. Patient denies pain to palpation from the calf down to the ankle. The soft tissues are nontender without edema he has no pain with compression of the Achilles. No pain to palpation over the malleolar. He can perform flexion and extension of the foot. I can also perform rotational motion at the arch. Pain is localized to palpation into the sole of the foot at the anterior aspect of the heel. Area pain is very suggestive of the plantar fascia insertion.  Neurological: He is alert and oriented to person, place, and time. Coordination normal.  Skin: Skin is warm and dry.  Psychiatric: He has a normal mood and affect.    ED Course  Procedures (including critical care time) Labs Review Labs Reviewed - No data to display  Imaging Review Dg Foot Complete Left  09/07/2015  CLINICAL DATA:  Left foot pain/injury EXAM: LEFT FOOT - COMPLETE 3+ VIEW COMPARISON:  None. FINDINGS: No fracture or dislocation is seen. The joint spaces are preserved. The visualized soft tissues are unremarkable. IMPRESSION: No fracture or dislocation is seen. Electronically Signed   By: Julian Hy M.D.   On:  09/07/2015 13:32   I have personally reviewed and evaluated these images and lab results as part of my medical decision-making.   EKG Interpretation None      MDM   Final diagnoses:  Plantar fascia rupture   Suspect plantar fascia tear. Patient does not have forefoot or ankle pain that would suggest dislocation or fracture. At this time patient will be placed in a postoperative shoe and temporarily nonweightbearing. He will undergo conservative treatment of rest elevation and icing and follow-up with his orthopedist.  Charlesetta Shanks, MD 09/07/15 1356

## 2015-09-07 NOTE — ED Notes (Signed)
Pt reports that he was walking down a sloped hill yesterday and left foot turned wrong and immediately started burning,

## 2015-09-07 NOTE — Discharge Instructions (Signed)
Foot Sprain A foot sprain is an injury to one of the strong bands of tissue (ligaments) that connect and support the many bones in your feet. The ligament can be stretched too much or it can tear. A tear can be either partial or complete. The severity of the sprain depends on how much of the ligament was damaged or torn. Your injury is most likely a torn or stretched plantar fascia. CAUSES A foot sprain is usually caused by suddenly twisting or pivoting your foot. RISK FACTORS This injury is more likely to occur in people who:  Play a sport, such as basketball or football.  Exercise or play a sport without warming up.  Start a new workout or sport.  Suddenly increase how long or hard they exercise or play a sport. SYMPTOMS Symptoms of this condition start soon after an injury and include:  Pain, especially in the arch of the foot.  Bruising.  Swelling.  Inability to walk or use the foot to support body weight. DIAGNOSIS This condition is diagnosed with a medical history and physical exam. You may also have imaging tests, such as:  X-rays to make sure there are no broken bones (fractures).  MRI to see if the ligament has torn. TREATMENT Treatment varies depending on the severity of your sprain. Mild sprains can be treated with rest, ice, compression, and elevation (RICE). If your ligament is overstretched or partially torn, treatment usually involves keeping your foot in a fixed position (immobilization) for a period of time. To help you do this, your health care provider will apply a bandage, splint, or walking boot to keep your foot from moving until it heals. You may also be advised to use crutches or a scooter for a few weeks to avoid bearing weight on your foot while it is healing. If your ligament is fully torn, you may need surgery to reconnect the ligament to the bone. After surgery, a cast or splint will be applied and will need to stay on your foot while it heals. Your  health care provider may also suggest exercises or physical therapy to strengthen your foot. HOME CARE INSTRUCTIONS If You Have a Bandage, Splint, or Walking Boot:  Wear it as directed by your health care provider. Remove it only as directed by your health care provider.  Loosen the bandage, splint, or walking boot if your toes become numb and tingle, or if they turn cold and blue. Bathing  If your health care provider approves bathing and showering, cover the bandage or splint with a watertight plastic bag to protect it from water. Do not let the bandage or splint get wet. Managing Pain, Stiffness, and Swelling   If directed, apply ice to the injured area:  Put ice in a plastic bag.  Place a towel between your skin and the bag.  Leave the ice on for 20 minutes, 2-3 times per day.  Move your toes often to avoid stiffness and to lessen swelling.  Raise (elevate) the injured area above the level of your heart while you are sitting or lying down. Driving  Do not drive or operate heavy machinery while taking pain medicine.  Do not drive while wearing a bandage, splint, or walking boot on a foot that you use for driving. Activity  Rest as directed by your health care provider.  Do not use the injured foot to support your body weight until your health care provider says that you can. Use crutches or other supportive devices as  directed by your health care provider.  Ask your health care provider what activities are safe for you. Gradually increase how much and how far you walk until your health care provider says it is safe to return to full activity.  Do any exercise or physical therapy as directed by your health care provider. General Instructions  If a splint was applied, do not put pressure on any part of it until it is fully hardened. This may take several hours.  Take medicines only as directed by your health care provider. These include over-the-counter medicines and  prescription medicines.  Keep all follow-up visits as directed by your health care provider. This is important.  When you can walk without pain, wear supportive shoes that have stiff soles. Do not wear flip-flops, and do not walk barefoot. SEEK MEDICAL CARE IF:  Your pain is not controlled with medicine.  Your bruising or swelling gets worse or does not get better with treatment.  Your splint or walking boot is damaged. SEEK IMMEDIATE MEDICAL CARE IF:  Your foot is numb or blue.  Your foot feels colder than normal.   This information is not intended to replace advice given to you by your health care provider. Make sure you discuss any questions you have with your health care provider.   Document Released: 04/23/2002 Document Revised: 03/18/2015 Document Reviewed: 09/04/2014 Elsevier Interactive Patient Education [employer].

## 2015-09-09 ENCOUNTER — Other Ambulatory Visit (INDEPENDENT_AMBULATORY_CARE_PROVIDER_SITE_OTHER): Payer: Federal, State, Local not specified - PPO

## 2015-09-09 DIAGNOSIS — L409 Psoriasis, unspecified: Secondary | ICD-10-CM | POA: Diagnosis not present

## 2015-09-09 DIAGNOSIS — I10 Essential (primary) hypertension: Secondary | ICD-10-CM

## 2015-09-09 DIAGNOSIS — E1142 Type 2 diabetes mellitus with diabetic polyneuropathy: Secondary | ICD-10-CM

## 2015-09-09 DIAGNOSIS — E785 Hyperlipidemia, unspecified: Secondary | ICD-10-CM

## 2015-09-09 LAB — HEPATIC FUNCTION PANEL
ALT: 9 U/L (ref 0–53)
AST: 12 U/L (ref 0–37)
Albumin: 4 g/dL (ref 3.5–5.2)
Alkaline Phosphatase: 91 U/L (ref 39–117)
Bilirubin, Direct: 0.1 mg/dL (ref 0.0–0.3)
Total Bilirubin: 0.3 mg/dL (ref 0.2–1.2)
Total Protein: 7.1 g/dL (ref 6.0–8.3)

## 2015-09-09 LAB — LIPID PANEL
Cholesterol: 108 mg/dL (ref 0–200)
HDL: 31.7 mg/dL — ABNORMAL LOW (ref >39.00)
LDL Cholesterol: 59 mg/dL (ref 0–99)
NonHDL: 76.71
Total CHOL/HDL Ratio: 3
Triglycerides: 88 mg/dL (ref 0.0–149.0)
VLDL: 17.6 mg/dL (ref 0.0–40.0)

## 2015-09-09 LAB — RENAL FUNCTION PANEL
Albumin: 4 g/dL (ref 3.5–5.2)
BUN: 20 mg/dL (ref 6–23)
CO2: 32 mEq/L (ref 19–32)
Calcium: 9.2 mg/dL (ref 8.4–10.5)
Chloride: 105 mEq/L (ref 96–112)
Creatinine, Ser: 0.98 mg/dL (ref 0.40–1.50)
GFR: 82.74 mL/min (ref >60.00)
Glucose, Bld: 67 mg/dL — ABNORMAL LOW (ref 70–99)
Phosphorus: 2.9 mg/dL (ref 2.3–4.6)
Potassium: 4.5 mEq/L (ref 3.5–5.1)
Sodium: 144 mEq/L (ref 135–145)

## 2015-09-09 LAB — CBC
HCT: 35.4 % — ABNORMAL LOW (ref 39.0–52.0)
Hemoglobin: 11.6 g/dL — ABNORMAL LOW (ref 13.0–17.0)
MCHC: 32.8 g/dL (ref 30.0–36.0)
MCV: 89.5 fl (ref 78.0–100.0)
Platelets: 356 10*3/uL (ref 150.0–400.0)
RBC: 3.96 Mil/uL — ABNORMAL LOW (ref 4.22–5.81)
RDW: 14.2 % (ref 11.5–15.5)
WBC: 8.9 10*3/uL (ref 4.0–10.5)

## 2015-09-09 LAB — TSH: TSH: 1.12 u[IU]/mL (ref 0.35–4.50)

## 2015-09-09 LAB — HEMOGLOBIN A1C: Hgb A1c MFr Bld: 6.8 % — ABNORMAL HIGH (ref 4.6–6.5)

## 2015-09-16 ENCOUNTER — Encounter: Payer: Self-pay | Admitting: Family Medicine

## 2015-09-16 ENCOUNTER — Ambulatory Visit (INDEPENDENT_AMBULATORY_CARE_PROVIDER_SITE_OTHER): Payer: Federal, State, Local not specified - PPO | Admitting: Family Medicine

## 2015-09-16 VITALS — BP 132/70 | HR 81 | Temp 98.1°F | Ht 71.0 in | Wt 218.1 lb

## 2015-09-16 DIAGNOSIS — D649 Anemia, unspecified: Secondary | ICD-10-CM

## 2015-09-16 DIAGNOSIS — G4700 Insomnia, unspecified: Secondary | ICD-10-CM

## 2015-09-16 DIAGNOSIS — K219 Gastro-esophageal reflux disease without esophagitis: Secondary | ICD-10-CM

## 2015-09-16 DIAGNOSIS — F1010 Alcohol abuse, uncomplicated: Secondary | ICD-10-CM

## 2015-09-16 DIAGNOSIS — E1169 Type 2 diabetes mellitus with other specified complication: Secondary | ICD-10-CM

## 2015-09-16 DIAGNOSIS — E119 Type 2 diabetes mellitus without complications: Secondary | ICD-10-CM

## 2015-09-16 DIAGNOSIS — R972 Elevated prostate specific antigen [PSA]: Secondary | ICD-10-CM

## 2015-09-16 DIAGNOSIS — F17200 Nicotine dependence, unspecified, uncomplicated: Secondary | ICD-10-CM

## 2015-09-16 DIAGNOSIS — Z86010 Personal history of colonic polyps: Secondary | ICD-10-CM | POA: Diagnosis not present

## 2015-09-16 DIAGNOSIS — I10 Essential (primary) hypertension: Secondary | ICD-10-CM

## 2015-09-16 DIAGNOSIS — E782 Mixed hyperlipidemia: Secondary | ICD-10-CM

## 2015-09-16 DIAGNOSIS — E669 Obesity, unspecified: Secondary | ICD-10-CM

## 2015-09-16 MED ORDER — METFORMIN HCL ER 500 MG PO TB24: ORAL_TABLET | ORAL | Status: DC

## 2015-09-16 NOTE — Patient Instructions (Addendum)
No metformin x 2 week then can restart as directed on bottle only take what is tolerated, stop increasing and return to dose that was tolerated without diarrhea. Adjust Lantus to accomodate. For next week use Lantus to 34 units daily and adjust as Metformin is restarted   Diarrhea Diarrhea is frequent loose and watery bowel movements. It can cause you to feel weak and dehydrated. Dehydration can cause you to become tired and thirsty, have a dry mouth, and have decreased urination that often is dark yellow. Diarrhea is a sign of another problem, most often an infection that will not last long. In most cases, diarrhea typically lasts 2-3 days. However, it can last longer if it is a sign of something more serious. It is important to treat your diarrhea as directed by your caregiver to lessen or prevent future episodes of diarrhea. CAUSES  Some common causes include:  Gastrointestinal infections caused by viruses, bacteria, or parasites.  Food poisoning or food allergies.  Certain medicines, such as antibiotics, chemotherapy, and laxatives.  Artificial sweeteners and fructose.  Digestive disorders. HOME CARE INSTRUCTIONS  Ensure adequate fluid intake (hydration): Have 1 cup (8 oz) of fluid for each diarrhea episode. Avoid fluids that contain simple sugars or sports drinks, fruit juices, whole milk products, and sodas. Your urine should be clear or pale yellow if you are drinking enough fluids. Hydrate with an oral rehydration solution that you can purchase at pharmacies, retail stores, and online. You can prepare an oral rehydration solution at home by mixing the following ingredients together:   - tsp table salt.   tsp baking soda.   tsp salt substitute containing potassium chloride.  1  tablespoons sugar.  1 L (34 oz) of water.  Certain foods and beverages may increase the speed at which food moves through the gastrointestinal (GI) tract. These foods and beverages should be avoided and  include:  Caffeinated and alcoholic beverages.  High-fiber foods, such as raw fruits and vegetables, nuts, seeds, and whole grain breads and cereals.  Foods and beverages sweetened with sugar alcohols, such as xylitol, sorbitol, and mannitol.  Some foods may be well tolerated and may help thicken stool including:  Starchy foods, such as rice, toast, pasta, low-sugar cereal, oatmeal, grits, baked potatoes, crackers, and bagels.  Bananas.  Applesauce.  Add probiotic-rich foods to help increase healthy bacteria in the GI tract, such as yogurt and fermented milk products.  Wash your hands well after each diarrhea episode.  Only take over-the-counter or prescription medicines as directed by your caregiver.  Take a warm bath to relieve any burning or pain from frequent diarrhea episodes. SEEK IMMEDIATE MEDICAL CARE IF:   You are unable to keep fluids down.  You have persistent vomiting.  You have blood in your stool, or your stools are black and tarry.  You do not urinate in 6-8 hours, or there is only a small amount of very dark urine.  You have abdominal pain that increases or localizes.  You have weakness, dizziness, confusion, or light-headedness.  You have a severe headache.  Your diarrhea gets worse or does not get better.  You have a fever or persistent symptoms for more than 2-3 days.  You have a fever and your symptoms suddenly get worse. MAKE SURE YOU:   Understand these instructions.  Will watch your condition.  Will get help right away if you are not doing well or get worse.   This information is not intended to replace advice given to  you by your health care provider. Make sure you discuss any questions you have with your health care provider.   Document Released: 10/22/2002 Document Revised: 11/22/2014 Document Reviewed: 07/09/2012 Elsevier Interactive Patient Education 2016 [employer].   Anemia, Nonspecific Anemia is a condition in which the  concentration of red blood cells or hemoglobin in the blood is below normal. Hemoglobin is a substance in red blood cells that carries oxygen to the tissues of the body. Anemia results in not enough oxygen reaching these tissues.  CAUSES  Common causes of anemia include:   Excessive bleeding. Bleeding may be internal or external. This includes excessive bleeding from periods (in women) or from the intestine.   Poor nutrition.   Chronic kidney, thyroid, and liver disease.  Bone marrow disorders that decrease red blood cell production.  Cancer and treatments for cancer.  HIV, AIDS, and their treatments.  Spleen problems that increase red blood cell destruction.  Blood disorders.  Excess destruction of red blood cells due to infection, medicines, and autoimmune disorders. SIGNS AND SYMPTOMS   Minor weakness.   Dizziness.   Headache.  Palpitations.   Shortness of breath, especially with exercise.   Paleness.  Cold sensitivity.  Indigestion.  Nausea.  Difficulty sleeping.  Difficulty concentrating. Symptoms may occur suddenly or they may develop slowly.  DIAGNOSIS  Additional blood tests are often needed. These help your health care provider determine the best treatment. Your health care provider will check your stool for blood and look for other causes of blood loss.  TREATMENT  Treatment varies depending on the cause of the anemia. Treatment can include:   Supplements of iron, vitamin E17, or folic acid.   Hormone medicines.   A blood transfusion. This may be needed if blood loss is severe.   Hospitalization. This may be needed if there is significant continual blood loss.   Dietary changes.  Spleen removal. HOME CARE INSTRUCTIONS Keep all follow-up appointments. It often takes many weeks to correct anemia, and having your health care provider check on your condition and your response to treatment is very important. SEEK IMMEDIATE MEDICAL CARE  IF:   You develop extreme weakness, shortness of breath, or chest pain.   You become dizzy or have trouble concentrating.  You develop heavy vaginal bleeding.   You develop a rash.   You have bloody or black, tarry stools.   You faint.   You vomit up blood.   You vomit repeatedly.   You have abdominal pain.  You have a fever or persistent symptoms for more than 2-3 days.   You have a fever and your symptoms suddenly get worse.   You are dehydrated.  MAKE SURE YOU:  Understand these instructions.  Will watch your condition.  Will get help right away if you are not doing well or get worse.   This information is not intended to replace advice given to you by your health care provider. Make sure you discuss any questions you have with your health care provider.   Document Released: 12/09/2004 Document Revised: 07/04/2013 Document Reviewed: 04/27/2013 Elsevier Interactive Patient Education [employer].

## 2015-09-16 NOTE — Progress Notes (Signed)
Pre visit review using our clinic review tool, if applicable. No additional management support is needed unless otherwise documented below in the visit note. 

## 2015-09-16 NOTE — Assessment & Plan Note (Signed)
Last cigarette smoked May 30, 2015. Still vaping daily but without nicotine, encouraged to consider cessation

## 2015-09-28 NOTE — Assessment & Plan Note (Signed)
Encouraged good sleep hygiene such as dark, quiet room. No blue/green glowing lights such as computer screens in bedroom. No alcohol or stimulants in evening. Cut down on caffeine as able. Regular exercise is helpful but not just prior to bed time.  

## 2015-09-28 NOTE — Assessment & Plan Note (Signed)
Avoid offending foods, start probiotics. Do not eat large meals in late evening and consider raising head of bed.  

## 2015-09-28 NOTE — Assessment & Plan Note (Signed)
Increase leafy greens, consider increased lean red meat and using cast iron cookware. Continue to monitor, report any concerns 

## 2015-09-28 NOTE — Assessment & Plan Note (Signed)
Adenomatous and told to repeat in 3 years. Persistent mild anemia. Referred to gastroenterology

## 2015-09-28 NOTE — Assessment & Plan Note (Signed)
Tolerating statin, encouraged heart healthy diet, avoid trans fats, minimize simple carbs and saturated fats. Increase exercise as tolerated 

## 2015-09-28 NOTE — Assessment & Plan Note (Signed)
hgba1c acceptable, minimize simple carbs. Increase exercise as tolerated. Continue current meds 

## 2015-09-28 NOTE — Progress Notes (Signed)
Subjective:    Patient ID: Gene [name], male    DOB: [birth year], 59 y.o.   MRN: [id number]  Chief Complaint  Patient presents with  . Follow-up    HPI Patient is in today for ollow-up. Is feeling well. Does continue to struggle with some low back and ankle pain but is following with Dr. Sharol Given of orthopedics. Sugars are generally been well controlled although he has had a few low numbers. His lowest is been 50 8 at night and his highest 138. The low numbers have not been frequent. He denies polyuria or polydipsia. He has not smoked a cigarette since 06/04/2015 although he is continuing to use a vaporizer. There is no nicotine in the vap at this time. Is noting persistent peripheral neuropathy and burning in his feet as well as frequent stool but no bloody or tarry stool.  Past Medical History  Diagnosis Date  . History of colonic polyps   . Diabetes mellitus type II   . GERD (gastroesophageal reflux disease)   . Hyperlipidemia   . Hypertension   . CAD (coronary artery disease)   . PVD (peripheral vascular disease) ([place])     PTA RLE 12/08 [place]  . Diabetic neuropathy ([place])   . Plantar fasciitis   . Chronic pain in right foot   . Diabetes ([place])   . Anemia   . Neuromuscular disorder ([place])     NEUROPATHY  . Insomnia 03/25/2013  . Preventative health care 05/03/2014    Past Surgical History  Procedure Laterality Date  . Coronary artery bypass graft      200 SVG to diagonal, LIMA to LAD, SVG to LCX  . Other surgical history      PAD surgery  . Revision total knee arthroplasty      Family History  Problem Relation Age of Onset  . Hyperlipidemia    . Hypertension    . Stroke    . Coronary artery disease Mother   . Hypertension Mother   . Hyperlipidemia Father   . Aneurysm Father     brain  . Colon cancer Neg Hx   . Esophageal cancer Neg Hx   . Cancer Maternal Uncle     Social History   Social History  . Marital Status: Married    Spouse Name: N/A  . Number  of Children: N/A  . Years of Education: N/A   Occupational History  . Not on file.   Social History Main Topics  . Smoking status: Current Some Day Smoker -- 0.50 packs/day for 40 years    Types: Cigarettes  . Smokeless tobacco: Never Used  . Alcohol Use: No  . Drug Use: No     Comment: rare  . Sexual Activity: Yes     Comment: lives with wife, works at [employer].   Other Topics Concern  . Not on file   Social History Narrative   Occupation: grocery clerk   Second marriage - 3 children from first marriage   Tobacco Use - Yes.     Alcohol Use - yes(rare)            Outpatient Prescriptions Prior to Visit  Medication Sig Dispense Refill  . amLODipine (NORVASC) 10 MG tablet TAKE ONE TABLET BY MOUTH ONCE DAILY 90 tablet 0  . atorvastatin (LIPITOR) 10 MG tablet TAKE ONE TABLET BY MOUTH ONCE DAILY 90 tablet 0  . BD ULTRA-FINE LANCETS lancets Check daily and prn  DX 250.00 100 each 1  .  calcipotriene (DOVONEX) 0.005 % cream Apply topically 2 (two) times daily. Patient needs appointment for further refills. 60 g 0  . fish oil-omega-3 fatty acids 1000 MG capsule Take 1 g by mouth at bedtime.      . gabapentin (NEURONTIN) 600 MG tablet TAKE ONE TABLET BY MOUTH 4 TIMES DAILY 360 tablet 0  . glimepiride (AMARYL) 4 MG tablet TAKE ONE TABLET BY MOUTH ONCE DAILY WITH BREAKFAST 90 tablet 0  . Insulin Glargine (LANTUS SOLOSTAR) 100 UNIT/ML Solostar Pen Inject 32 Units into the skin daily at 10 pm. 45 mL 3  . labetalol (NORMODYNE) 200 MG tablet TAKE ONE TABLET BY MOUTH TWICE DAILY 180 tablet 0  . lisinopril (PRINIVIL,ZESTRIL) 40 MG tablet TAKE ONE TABLET BY MOUTH ONCE DAILY 90 tablet 0  . Multiple Vitamins-Minerals (CENTRUM PO) Take by mouth daily.      . nitroGLYCERIN (NITROSTAT) 0.4 MG SL tablet Place 0.4 mg under the tongue every 5 (five) minutes as needed.      Marland Kitchen omeprazole (PRILOSEC) 40 MG capsule TAKE ONE CAPSULE BY MOUTH ONCE DAILY 30 capsule 2  . RELION PEN NEEDLES 29G X 12MM  MISC USE DAILY IN THE PM AS DIRECTED 100 each 6  . traMADol (ULTRAM) 50 MG tablet Take 2 tablets (100 mg total) by mouth every 6 (six) hours as needed. 20 tablet 0  . Glucose Blood DISK 1 each by In Vitro route 3 (three) times daily. 100 each 3  . Insulin Pen Needle 29G X 12.7MM MISC Use daily and prn DX 250.00 100 each 1  . metFORMIN (GLUCOPHAGE) 1000 MG tablet TAKE ONE TABLET BY MOUTH TWICE DAILY WITH MEALS 180 tablet 0   No facility-administered medications prior to visit.    No Known Allergies  Review of Systems  Constitutional: Negative for fever and malaise/fatigue.  HENT: Negative for congestion.   Eyes: Negative for discharge.  Respiratory: Negative for shortness of breath.   Cardiovascular: Negative for chest pain, palpitations and leg swelling.  Gastrointestinal: Negative for nausea and abdominal pain.  Genitourinary: Negative for dysuria.  Musculoskeletal: Positive for back pain. Negative for falls.  Skin: Negative for rash.  Neurological: Negative for loss of consciousness and headaches.  Endo/Heme/Allergies: Negative for environmental allergies.  Psychiatric/Behavioral: Negative for depression. The patient is not nervous/anxious.        Objective:    Physical Exam  Constitutional: He is oriented to person, place, and time. He appears well-developed and well-nourished. No distress.  HENT:  Head: Normocephalic and atraumatic.  Nose: Nose normal.  Eyes: Right eye exhibits no discharge. Left eye exhibits no discharge.  Neck: Normal range of motion. Neck supple.  Cardiovascular: Normal rate and regular rhythm.   No murmur heard. Pulmonary/Chest: Effort normal and breath sounds normal.  Abdominal: Soft. Bowel sounds are normal. There is no tenderness.  Musculoskeletal: He exhibits no edema.  Neurological: He is alert and oriented to person, place, and time.  Skin: Skin is warm and dry.  Psychiatric: He has a normal mood and affect.  Nursing note and vitals  reviewed.   BP 132/70 mmHg  Pulse 81  Temp(Src) 98.1 F (36.7 C) (Oral)  Ht 5\' 11"  (1.803 m)  Wt 218 lb 2 oz (98.941 kg)  BMI 30.44 kg/m2  SpO2 96% Wt Readings from Last 3 Encounters:  09/16/15 218 lb 2 oz (98.941 kg)  09/07/15 208 lb (94.348 kg)  11/01/14 210 lb 6.4 oz (95.437 kg)     Lab Results  Component Value Date  WBC 8.9 09/09/2015   HGB 11.6* 09/09/2015   HCT 35.4* 09/09/2015   PLT 356.0 09/09/2015   GLUCOSE 67* 09/09/2015   CHOL 108 09/09/2015   TRIG 88.0 09/09/2015   HDL 31.70* 09/09/2015   LDLCALC 59 09/09/2015   ALT 9 09/09/2015   AST 12 09/09/2015   NA 144 09/09/2015   K 4.5 09/09/2015   CL 105 09/09/2015   CREATININE 0.98 09/09/2015   BUN 20 09/09/2015   CO2 32 09/09/2015   TSH 1.12 09/09/2015   PSA 5.38* 04/23/2014   INR 1.1 07/31/2008   HGBA1C 6.8* 09/09/2015   MICROALBUR 7.99* 08/09/2012    Lab Results  Component Value Date   TSH 1.12 09/09/2015   Lab Results  Component Value Date   WBC 8.9 09/09/2015   HGB 11.6* 09/09/2015   HCT 35.4* 09/09/2015   MCV 89.5 09/09/2015   PLT 356.0 09/09/2015   Lab Results  Component Value Date   NA 144 09/09/2015   K 4.5 09/09/2015   CO2 32 09/09/2015   GLUCOSE 67* 09/09/2015   BUN 20 09/09/2015   CREATININE 0.98 09/09/2015   BILITOT 0.3 09/09/2015   ALKPHOS 91 09/09/2015   AST 12 09/09/2015   ALT 9 09/09/2015   PROT 7.1 09/09/2015   ALBUMIN 4.0 09/09/2015   ALBUMIN 4.0 09/09/2015   CALCIUM 9.2 09/09/2015   GFR 82.74 09/09/2015   Lab Results  Component Value Date   CHOL 108 09/09/2015   Lab Results  Component Value Date   HDL 31.70* 09/09/2015   Lab Results  Component Value Date   LDLCALC 59 09/09/2015   Lab Results  Component Value Date   TRIG 88.0 09/09/2015   Lab Results  Component Value Date   CHOLHDL 3 09/09/2015   Lab Results  Component Value Date   HGBA1C 6.8* 09/09/2015       Assessment & Plan:   Problem List Items Addressed This Visit    TOBACCO USER -  Primary    Last cigarette smoked May 30, 2015. Still vaping daily but without nicotine, encouraged to consider cessation       Relevant Orders   TSH   CBC   TSH   Lipid panel   Hemoglobin A1c   Microalbumin / creatinine urine ratio   Comprehensive metabolic panel   PSA   Insomnia    Encouraged good sleep hygiene such as dark, quiet room. No blue/green glowing lights such as computer screens in bedroom. No alcohol or stimulants in evening. Cut down on caffeine as able. Regular exercise is helpful but not just prior to bed time.       Hyperlipidemia, mixed    Tolerating statin, encouraged heart healthy diet, avoid trans fats, minimize simple carbs and saturated fats. Increase exercise as tolerated      Relevant Orders   CBC   TSH   Lipid panel   Hemoglobin A1c   Microalbumin / creatinine urine ratio   Comprehensive metabolic panel   PSA   GERD    Avoid offending foods, start probiotics. Do not eat large meals in late evening and consider raising head of bed.       Essential hypertension   Relevant Orders   CBC   TSH   Lipid panel   Hemoglobin A1c   Microalbumin / creatinine urine ratio   Comprehensive metabolic panel   PSA   Elevated PSA   Relevant Orders   CBC   TSH   Lipid panel  Hemoglobin A1c   Microalbumin / creatinine urine ratio   Comprehensive metabolic panel   PSA   Diabetes mellitus type 2 in obese (HCC)    hgba1c acceptable, minimize simple carbs. Increase exercise as tolerated. Continue current meds      Relevant Medications   metFORMIN (GLUCOPHAGE XR) 500 MG 24 hr tablet   Other Relevant Orders   CBC   TSH   Lipid panel   Hemoglobin A1c   Microalbumin / creatinine urine ratio   Comprehensive metabolic panel   PSA   COLONIC POLYPS, HX OF    Adenomatous and told to repeat in 3 years. Persistent mild anemia. Referred to gastroenterology      Relevant Orders   CBC   TSH   Lipid panel   Hemoglobin A1c   Microalbumin / creatinine urine  ratio   Comprehensive metabolic panel   PSA   Anemia    Increase leafy greens, consider increased lean red meat and using cast iron cookware. Continue to monitor, report any concerns      Relevant Orders   CBC   TSH   Lipid panel   Hemoglobin A1c   Microalbumin / creatinine urine ratio   Comprehensive metabolic panel   PSA   ALCOHOL ABUSE, EPISODIC   Relevant Orders   CBC   TSH   Lipid panel   Hemoglobin A1c   Microalbumin / creatinine urine ratio   Comprehensive metabolic panel   PSA    Other Visit Diagnoses    History of colonic polyps        Relevant Orders    Ambulatory referral to Gastroenterology    CBC    TSH    Lipid panel    Hemoglobin A1c    Microalbumin / creatinine urine ratio    Comprehensive metabolic panel    PSA       I have discontinued Mr. Laurich Glucose Blood, Insulin Pen Needle, and metFORMIN. I am also having him start on metFORMIN. Additionally, I am having him maintain his Multiple Vitamins-Minerals (CENTRUM PO), nitroGLYCERIN, fish oil-omega-3 fatty acids, BD ULTRA-FINE LANCETS, Insulin Glargine, RELION PEN NEEDLES, labetalol, omeprazole, gabapentin, calcipotriene, glimepiride, atorvastatin, lisinopril, amLODipine, and traMADol.  Meds ordered this encounter  Medications  . metFORMIN (GLUCOPHAGE XR) 500 MG 24 hr tablet    Sig: 1 tab po daily x 3 days then increase to 1 tab po bid x 3 days then increase to 2 tabs po in am and 1 tab po q pm x 3 days then increase to 2 tabs po bid as tolerated    Dispense:  120 tablet    Refill:  3     Penni Homans, MD

## 2015-11-03 ENCOUNTER — Other Ambulatory Visit: Payer: Self-pay | Admitting: Family Medicine

## 2015-11-03 ENCOUNTER — Ambulatory Visit (INDEPENDENT_AMBULATORY_CARE_PROVIDER_SITE_OTHER): Payer: Federal, State, Local not specified - PPO | Admitting: Physician Assistant

## 2015-11-03 ENCOUNTER — Encounter: Payer: Self-pay | Admitting: Physician Assistant

## 2015-11-03 VITALS — BP 138/50 | HR 70 | Temp 98.3°F | Ht 71.0 in | Wt 233.6 lb

## 2015-11-03 DIAGNOSIS — M25562 Pain in left knee: Secondary | ICD-10-CM | POA: Insufficient documentation

## 2015-11-03 HISTORY — DX: Pain in left knee: M25.562

## 2015-11-03 MED ORDER — TRAMADOL HCL 50 MG PO TABS: 100.0000 mg | ORAL_TABLET | Freq: Two times a day (BID) | ORAL | Status: DC | PRN

## 2015-11-03 NOTE — Assessment & Plan Note (Signed)
Suspect mild MCL strain. Rx Tramadol. Topical Aspercreme. RICE discussed. Knee brace recommended.  Follow-up if not resolving.

## 2015-11-03 NOTE — Progress Notes (Signed)
Pre visit review using our clinic review tool, if applicable. No additional management support is needed unless otherwise documented below in the visit note. 

## 2015-11-03 NOTE — Patient Instructions (Signed)
Please get a knee sleeve for the knee and wear daily. Alternate Ice and Heat to the knee. Rest and elevate the extremity to help with pain and swelling. Apply topical Aspercreme to the area. Take Tramadol as directed for moderate to severe pain. Symptoms should continue to resolve.  If not, call or come see [place] in office.

## 2015-11-03 NOTE — Progress Notes (Signed)
Patient presents to clinic today c/o pain in left knee after kneeling for a prolonged period of time at work last week. Endorses significant pain and anterior knee swelling once getting up from kneeled position. Denies numbness/tingling or color change of extremity. Denies history of gout. Has taken Ibuprofen for symptoms relief with only relief in pain.  Past Medical History  Diagnosis Date  . History of colonic polyps   . Diabetes mellitus type II   . GERD (gastroesophageal reflux disease)   . Hyperlipidemia   . Hypertension   . CAD (coronary artery disease)   . PVD (peripheral vascular disease) ([place])     PTA RLE 12/08 [place]  . Diabetic neuropathy ([place])   . Plantar fasciitis   . Chronic pain in right foot   . Diabetes ([place])   . Anemia   . Neuromuscular disorder ([place])     NEUROPATHY  . Insomnia 03/25/2013  . Preventative health care 05/03/2014    Current Outpatient Prescriptions on File Prior to Visit  Medication Sig Dispense Refill  . amLODipine (NORVASC) 10 MG tablet TAKE ONE TABLET BY MOUTH ONCE DAILY 90 tablet 0  . atorvastatin (LIPITOR) 10 MG tablet TAKE ONE TABLET BY MOUTH ONCE DAILY 90 tablet 0  . BD ULTRA-FINE LANCETS lancets Check daily and prn  DX 250.00 100 each 1  . calcipotriene (DOVONEX) 0.005 % cream Apply topically 2 (two) times daily. Patient needs appointment for further refills. 60 g 0  . fish oil-omega-3 fatty acids 1000 MG capsule Take 1 g by mouth at bedtime.      . gabapentin (NEURONTIN) 600 MG tablet TAKE ONE TABLET BY MOUTH 4 TIMES DAILY 360 tablet 0  . glimepiride (AMARYL) 4 MG tablet TAKE ONE TABLET BY MOUTH ONCE DAILY WITH BREAKFAST 90 tablet 0  . Insulin Glargine (LANTUS SOLOSTAR) 100 UNIT/ML Solostar Pen Inject 32 Units into the skin daily at 10 pm. 45 mL 3  . labetalol (NORMODYNE) 200 MG tablet TAKE ONE TABLET BY MOUTH TWICE DAILY 180 tablet 0  . lisinopril (PRINIVIL,ZESTRIL) 40 MG tablet TAKE ONE TABLET BY MOUTH ONCE DAILY 90 tablet 0  .  metFORMIN (GLUCOPHAGE XR) 500 MG 24 hr tablet 1 tab po daily x 3 days then increase to 1 tab po bid x 3 days then increase to 2 tabs po in am and 1 tab po q pm x 3 days then increase to 2 tabs po bid as tolerated 120 tablet 3  . Multiple Vitamins-Minerals (CENTRUM PO) Take by mouth daily.      . nitroGLYCERIN (NITROSTAT) 0.4 MG SL tablet Place 0.4 mg under the tongue every 5 (five) minutes as needed.      Marland Kitchen omeprazole (PRILOSEC) 40 MG capsule TAKE ONE CAPSULE BY MOUTH ONCE DAILY 30 capsule 6  . RELION PEN NEEDLES 29G X 12MM MISC USE DAILY IN THE PM AS DIRECTED 100 each 6   No current facility-administered medications on file prior to visit.    No Known Allergies  Family History  Problem Relation Age of Onset  . Hyperlipidemia    . Hypertension    . Stroke    . Coronary artery disease Mother   . Hypertension Mother   . Hyperlipidemia Father   . Aneurysm Father     brain  . Colon cancer Neg Hx   . Esophageal cancer Neg Hx   . Cancer Maternal Uncle     Social History   Social History  . Marital Status: Married  Spouse Name: N/A  . Number of Children: N/A  . Years of Education: N/A   Social History Main Topics  . Smoking status: Former Smoker -- 0.50 packs/day for 40 years    Types: Cigarettes    Quit date: 05/04/2015  . Smokeless tobacco: Never Used  . Alcohol Use: No  . Drug Use: No     Comment: rare  . Sexual Activity: Yes     Comment: lives with wife, works at [employer].   Other Topics Concern  . None   Social History Narrative   Occupation: TEFL teacher   Second marriage - 3 children from first marriage   Tobacco Use - Yes.     Alcohol Use - yes(rare)           Review of Systems - See HPI.  All other ROS are negative.  BP 138/50 mmHg  Pulse 70  Temp(Src) 98.3 F (36.8 C) (Oral)  Ht 5\' 11"  (1.803 m)  Wt 233 lb 9.6 oz (105.96 kg)  BMI 32.59 kg/m2  SpO2 98%  Physical Exam  Constitutional: He is oriented to person, place, and time and  well-developed, well-nourished, and in no distress.  HENT:  Head: Normocephalic and atraumatic.  Cardiovascular: Normal rate, regular rhythm, normal heart sounds and intact distal pulses.   Musculoskeletal:       Left knee: He exhibits no erythema, normal alignment, no LCL laxity, normal patellar mobility, no bony tenderness, normal meniscus and no MCL laxity. Tenderness found. Medial joint line tenderness noted.  Neurological: He is alert and oriented to person, place, and time.  Skin: Skin is warm and dry. No rash noted.  Psychiatric: Affect normal.  Vitals reviewed.   Recent Results (from the past 2160 hour(s))  CBC     Status: Abnormal   Collection Time: 09/09/15  8:42 AM  Result Value Ref Range   WBC 8.9 4.0 - 10.5 K/uL   RBC 3.96 (L) 4.22 - 5.81 Mil/uL   Platelets 356.0 150.0 - 400.0 K/uL   Hemoglobin 11.6 (L) 13.0 - 17.0 g/dL   HCT 35.4 (L) 39.0 - 52.0 %   MCV 89.5 78.0 - 100.0 fl   MCHC 32.8 30.0 - 36.0 g/dL   RDW 14.2 11.5 - 15.5 %  TSH     Status: None   Collection Time: 09/09/15  8:42 AM  Result Value Ref Range   TSH 1.12 0.35 - 4.50 uIU/mL  Renal function panel     Status: Abnormal   Collection Time: 09/09/15  8:42 AM  Result Value Ref Range   Sodium 144 135 - 145 mEq/L   Potassium 4.5 3.5 - 5.1 mEq/L   Chloride 105 96 - 112 mEq/L   CO2 32 19 - 32 mEq/L   Calcium 9.2 8.4 - 10.5 mg/dL   Albumin 4.0 3.5 - 5.2 g/dL   BUN 20 6 - 23 mg/dL   Creatinine, Ser 0.98 0.40 - 1.50 mg/dL   Glucose, Bld 67 (L) 70 - 99 mg/dL   Phosphorus 2.9 2.3 - 4.6 mg/dL   GFR 82.74 >60.00 mL/min  Lipid panel     Status: Abnormal   Collection Time: 09/09/15  8:42 AM  Result Value Ref Range   Cholesterol 108 0 - 200 mg/dL    Comment: ATP III Classification       Desirable:  < 200 mg/dL               Borderline High:  200 - 239 mg/dL  High:  > = 240 mg/dL   Triglycerides 88.0 0.0 - 149.0 mg/dL    Comment: Normal:  <150 mg/dLBorderline High:  150 - 199 mg/dL   HDL 31.70 (L) >39.00  mg/dL   VLDL 17.6 0.0 - 40.0 mg/dL   LDL Cholesterol 59 0 - 99 mg/dL   Total CHOL/HDL Ratio 3     Comment:                Men          Women1/2 Average Risk     3.4          3.3Average Risk          5.0          4.42X Average Risk          9.6          7.13X Average Risk          15.0          11.0                       NonHDL 76.71     Comment: NOTE:  Non-HDL goal should be 30 mg/dL higher than patient's LDL goal (i.e. LDL goal of < 70 mg/dL, would have non-HDL goal of < 100 mg/dL)  Hepatic function panel     Status: None   Collection Time: 09/09/15  8:42 AM  Result Value Ref Range   Total Bilirubin 0.3 0.2 - 1.2 mg/dL   Bilirubin, Direct 0.1 0.0 - 0.3 mg/dL   Alkaline Phosphatase 91 39 - 117 U/L   AST 12 0 - 37 U/L   ALT 9 0 - 53 U/L   Total Protein 7.1 6.0 - 8.3 g/dL   Albumin 4.0 3.5 - 5.2 g/dL  Hemoglobin A1c     Status: Abnormal   Collection Time: 09/09/15  8:42 AM  Result Value Ref Range   Hgb A1c MFr Bld 6.8 (H) 4.6 - 6.5 %    Comment: Glycemic Control Guidelines for People with Diabetes:Non Diabetic:  <6%Goal of Therapy: <7%Additional Action Suggested:  >8%    Assessment/Plan: Left knee pain Suspect mild MCL strain. Rx Tramadol. Topical Aspercreme. RICE discussed. Knee brace recommended.  Follow-up if not resolving.

## 2015-11-22 ENCOUNTER — Other Ambulatory Visit: Payer: Self-pay | Admitting: Family Medicine

## 2015-11-26 ENCOUNTER — Other Ambulatory Visit: Payer: Self-pay | Admitting: Family Medicine

## 2015-11-26 NOTE — Telephone Encounter (Signed)
Refilled 3 of pt rx request #[id number] with 0 refills . Pt also requesting gabapentin refill, Please advise?

## 2015-12-02 ENCOUNTER — Other Ambulatory Visit: Payer: Self-pay | Admitting: Family Medicine

## 2015-12-17 ENCOUNTER — Encounter: Payer: Self-pay | Admitting: Gastroenterology

## 2016-01-05 ENCOUNTER — Other Ambulatory Visit: Payer: Self-pay | Admitting: Family Medicine

## 2016-01-13 ENCOUNTER — Ambulatory Visit (INDEPENDENT_AMBULATORY_CARE_PROVIDER_SITE_OTHER): Payer: Federal, State, Local not specified - PPO | Admitting: Medical

## 2016-01-13 ENCOUNTER — Encounter: Payer: Self-pay | Admitting: Medical

## 2016-01-13 VITALS — BP 138/70 | HR 75 | Temp 98.1°F | Ht 71.0 in | Wt 222.6 lb

## 2016-01-13 DIAGNOSIS — B029 Zoster without complications: Secondary | ICD-10-CM

## 2016-01-13 MED ORDER — FAMCICLOVIR 500 MG PO TABS: 500.0000 mg | ORAL_TABLET | Freq: Three times a day (TID) | ORAL | Status: DC

## 2016-01-13 MED ORDER — TRAMADOL HCL 50 MG PO TABS: 50.0000 mg | ORAL_TABLET | Freq: Three times a day (TID) | ORAL | Status: DC | PRN

## 2016-01-13 NOTE — Patient Instructions (Signed)
Very likely shingles. Rx famvir antiviral and tramadol for pain.  Work excuse.  Follow up 7-10 days or as needed  Will follow closely to see if you have post shingles nerve type pain.  Shingles Shingles, which is also known as herpes zoster, is an infection that causes a painful skin rash and fluid-filled blisters. Shingles is not related to genital herpes, which is a sexually transmitted infection.   Shingles only develops in people who:  Have had chickenpox.  Have received the chickenpox vaccine. (This is rare.) CAUSES Shingles is caused by varicella-zoster virus (VZV). This is the same virus that causes chickenpox. After exposure to VZV, the virus stays in the body in an inactive (dormant) state. Shingles develops if the virus reactivates. This can happen many years after the initial exposure to VZV. It is not known what causes this virus to reactivate. RISK FACTORS People who have had chickenpox or received the chickenpox vaccine are at risk for shingles. Infection is more common in people who:  Are older than age 11.  Have a weakened defense (immune) system, such as those with HIV, AIDS, or cancer.  Are taking medicines that weaken the immune system, such as transplant medicines.  Are under great stress. SYMPTOMS Early symptoms of this condition include itching, tingling, and pain in an area on your skin. Pain may be described as burning, stabbing, or throbbing. A few days or weeks after symptoms start, a painful red rash appears, usually on one side of the body in a bandlike or beltlike pattern. The rash eventually turns into fluid-filled blisters that break open, scab over, and dry up in about 2-3 weeks. At any time during the infection, you may also develop:  A fever.  Chills.  A headache.  An upset stomach. DIAGNOSIS This condition is diagnosed with a skin exam. Sometimes, skin or fluid samples are taken from the blisters before a diagnosis is made. These samples are  examined under a microscope or sent to a lab for testing. TREATMENT There is no specific cure for this condition. Your health care provider will probably prescribe medicines to help you manage pain, recover more quickly, and avoid long-term problems. Medicines may include:  Antiviral drugs.  Anti-inflammatory drugs.  Pain medicines. If the area involved is on your face, you may be referred to a specialist, such as an eye doctor (ophthalmologist) or an ear, nose, and throat (ENT) doctor to help you avoid eye problems, chronic pain, or disability. HOME CARE INSTRUCTIONS Medicines  Take medicines only as directed by your health care provider.  Apply an anti-itch or numbing cream to the affected area as directed by your health care provider. Blister and Rash Care  Take a cool bath or apply cool compresses to the area of the rash or blisters as directed by your health care provider. This may help with pain and itching.  Keep your rash covered with a loose bandage (dressing). Wear loose-fitting clothing to help ease the pain of material rubbing against the rash.  Keep your rash and blisters clean with mild soap and cool water or as directed by your health care provider.  Check your rash every day for signs of infection. These include redness, swelling, and pain that lasts or increases.  Do not pick your blisters.  Do not scratch your rash. General Instructions  Rest as directed by your health care provider.  Keep all follow-up visits as directed by your health care provider. This is important.  Until your blisters scab  over, your infection can cause chickenpox in people who have never had it or been vaccinated against it. To prevent this from happening, avoid contact with other people, especially:  Babies.  Pregnant women.  Children who have eczema.  Elderly people who have transplants.  People who have chronic illnesses, such as leukemia or AIDS. SEEK MEDICAL CARE IF:  Your  pain is not relieved with prescribed medicines.  Your pain does not get better after the rash heals.  Your rash looks infected. Signs of infection include redness, swelling, and pain that lasts or increases. SEEK IMMEDIATE MEDICAL CARE IF:  The rash is on your face or nose.  You have facial pain, pain around your eye area, or loss of feeling on one side of your face.  You have ear pain or you have ringing in your ear.  You have loss of taste.  Your condition gets worse.   This information is not intended to replace advice given to you by your health care provider. Make sure you discuss any questions you have with your health care provider.   Document Released: 11/01/2005 Document Revised: 11/22/2014 Document Reviewed: 09/12/2014 Elsevier Interactive Patient Education [employer].

## 2016-01-13 NOTE — Progress Notes (Signed)
Subjective:    Patient ID: Gene [name], male    DOB: 05-Oct-1956, 60 y.o.   MRN: [id number]  HPI  Pt had faint pinkish red area for 5 days over rt latisimus area. Then 2 days ago some cropped up lesions/bumps. Then last night small bumps rt axillary area and one on his chest. Pt states constant sharp pain in these area like sharp instrument digging into skin.   Review of Systems  Constitutional: Negative for fever, chills and fatigue.  Respiratory: Negative for cough, chest tightness, shortness of breath and wheezing.   Cardiovascular: Negative for chest pain and palpitations.  Skin: Positive for rash.  Neurological: Negative for dizziness and headaches.  Hematological: Negative for adenopathy. Does not bruise/bleed easily.   Past Medical History  Diagnosis Date  . History of colonic polyps   . Diabetes mellitus type II   . GERD (gastroesophageal reflux disease)   . Hyperlipidemia   . Hypertension   . CAD (coronary artery disease)   . PVD (peripheral vascular disease) ([place])     PTA RLE 12/08 [place]  . Diabetic neuropathy ([place])   . Plantar fasciitis   . Chronic pain in right foot   . Diabetes ([place])   . Anemia   . Neuromuscular disorder ([place])     NEUROPATHY  . Insomnia 03/25/2013  . Preventative health care 05/03/2014    Social History   Social History  . Marital Status: Married    Spouse Name: N/A  . Number of Children: N/A  . Years of Education: N/A   Occupational History  . Not on file.   Social History Main Topics  . Smoking status: Former Smoker -- 0.50 packs/day for 40 years    Types: Cigarettes    Quit date: 05/04/2015  . Smokeless tobacco: Never Used  . Alcohol Use: No  . Drug Use: No     Comment: rare  . Sexual Activity: Yes     Comment: lives with wife, works at [employer].   Other Topics Concern  . Not on file   Social History Narrative   Occupation: grocery clerk   Second marriage - 3 children from first marriage   Tobacco Use -  Yes.     Alcohol Use - yes(rare)            Past Surgical History  Procedure Laterality Date  . Coronary artery bypass graft      200 SVG to diagonal, LIMA to LAD, SVG to LCX  . Other surgical history      PAD surgery  . Revision total knee arthroplasty      Family History  Problem Relation Age of Onset  . Hyperlipidemia    . Hypertension    . Stroke    . Coronary artery disease Mother   . Hypertension Mother   . Hyperlipidemia Father   . Aneurysm Father     brain  . Colon cancer Neg Hx   . Esophageal cancer Neg Hx   . Cancer Maternal Uncle     No Known Allergies  Current Outpatient Prescriptions on File Prior to Visit  Medication Sig Dispense Refill  . amLODipine (NORVASC) 10 MG tablet TAKE ONE TABLET BY MOUTH ONCE DAILY 90 tablet 0  . atorvastatin (LIPITOR) 10 MG tablet TAKE ONE TABLET BY MOUTH ONCE DAILY 90 tablet 0  . BD ULTRA-FINE LANCETS lancets Check daily and prn  DX 250.00 100 each 1  . calcipotriene (DOVONEX) 0.005 % cream Apply topically  2 (two) times daily. Patient needs appointment for further refills. 60 g 0  . fish oil-omega-3 fatty acids 1000 MG capsule Take 1 g by mouth at bedtime.      . gabapentin (NEURONTIN) 600 MG tablet TAKE ONE TABLET BY MOUTH 4 TIMES DAILY 360 tablet 0  . glimepiride (AMARYL) 4 MG tablet TAKE ONE TABLET BY MOUTH ONCE DAILY WITH BREAKFAST 90 tablet 0  . labetalol (NORMODYNE) 200 MG tablet TAKE ONE TABLET BY MOUTH TWICE DAILY 180 tablet 0  . labetalol (NORMODYNE) 200 MG tablet TAKE ONE TABLET BY MOUTH TWICE DAILY 180 tablet 0  . LANTUS SOLOSTAR 100 UNIT/ML Solostar Pen INJECT 32 UNITS SUBCUTANEOUSLY ONCE DAILY AT 10 PM. 15 mL 5  . lisinopril (PRINIVIL,ZESTRIL) 40 MG tablet TAKE ONE TABLET BY MOUTH ONCE DAILY 90 tablet 0  . metFORMIN (GLUCOPHAGE XR) 500 MG 24 hr tablet 1 tab po daily x 3 days then increase to 1 tab po bid x 3 days then increase to 2 tabs po in am and 1 tab po q pm x 3 days then increase to 2 tabs po bid as tolerated  120 tablet 3  . Multiple Vitamins-Minerals (CENTRUM PO) Take by mouth daily.      . nitroGLYCERIN (NITROSTAT) 0.4 MG SL tablet Place 0.4 mg under the tongue every 5 (five) minutes as needed.      Marland Kitchen omeprazole (PRILOSEC) 40 MG capsule TAKE ONE CAPSULE BY MOUTH ONCE DAILY 30 capsule 6  . RELION PEN NEEDLES 29G X 12MM MISC USE PEN NEEDLES ONCE DAILY IN THE EVENING AS DIRECTED 100 each 0  . traMADol (ULTRAM) 50 MG tablet Take 2 tablets (100 mg total) by mouth every 12 (twelve) hours as needed. 45 tablet 0   No current facility-administered medications on file prior to visit.    BP 138/70 mmHg  Pulse 75  Temp(Src) 98.1 F (36.7 C) (Oral)  Ht 5\' 11"  (1.803 m)  Wt 222 lb 9.6 oz (100.971 kg)  BMI 31.06 kg/m2  SpO2 99%       Objective:   Physical Exam  General- No acute distress. Pleasant patient. Neck- Full range of motion, no jvd Lungs- Clear, even and unlabored. Heart- regular rate and rhythm. Neurologic- CNII- XII grossly intact.  Skin- 4 -5 papule appearing eruption grouped together rt lat area, 3 papular type eruption rt axillary and one on chest. All same level/dermatome.     Assessment & Plan:  Very likely shingles. Rx famvir antiviral and tramadol for pain.  Work excuse.  Follow up 7-10 days or as needed  Will follow closely to see if you have post shingles nerve type pain.

## 2016-01-13 NOTE — Progress Notes (Signed)
Pre visit review using our clinic review tool, if applicable. No additional management support is needed unless otherwise documented below in the visit note. 

## 2016-02-23 ENCOUNTER — Other Ambulatory Visit: Payer: Self-pay

## 2016-02-23 ENCOUNTER — Other Ambulatory Visit: Payer: Self-pay | Admitting: Family Medicine

## 2016-02-23 NOTE — Telephone Encounter (Signed)
Refilled patients rx request for #30 with 0 rf for glimepiride, lisinopril, amlodipine, and calcipotriene cream has lab apt in may.

## 2016-02-25 ENCOUNTER — Other Ambulatory Visit: Payer: Self-pay

## 2016-02-25 ENCOUNTER — Other Ambulatory Visit: Payer: Self-pay | Admitting: Family Medicine

## 2016-02-25 MED ORDER — METFORMIN HCL ER 500 MG PO TB24: 1000.0000 mg | ORAL_TABLET | Freq: Two times a day (BID) | ORAL | Status: DC

## 2016-03-15 ENCOUNTER — Other Ambulatory Visit (INDEPENDENT_AMBULATORY_CARE_PROVIDER_SITE_OTHER): Payer: Federal, State, Local not specified - PPO

## 2016-03-15 DIAGNOSIS — E1169 Type 2 diabetes mellitus with other specified complication: Secondary | ICD-10-CM

## 2016-03-15 DIAGNOSIS — F17200 Nicotine dependence, unspecified, uncomplicated: Secondary | ICD-10-CM | POA: Diagnosis not present

## 2016-03-15 DIAGNOSIS — R972 Elevated prostate specific antigen [PSA]: Secondary | ICD-10-CM

## 2016-03-15 DIAGNOSIS — F1010 Alcohol abuse, uncomplicated: Secondary | ICD-10-CM | POA: Diagnosis not present

## 2016-03-15 DIAGNOSIS — D649 Anemia, unspecified: Secondary | ICD-10-CM | POA: Diagnosis not present

## 2016-03-15 DIAGNOSIS — I10 Essential (primary) hypertension: Secondary | ICD-10-CM

## 2016-03-15 DIAGNOSIS — E669 Obesity, unspecified: Secondary | ICD-10-CM

## 2016-03-15 DIAGNOSIS — E782 Mixed hyperlipidemia: Secondary | ICD-10-CM

## 2016-03-15 DIAGNOSIS — Z86010 Personal history of colonic polyps: Secondary | ICD-10-CM

## 2016-03-15 DIAGNOSIS — E119 Type 2 diabetes mellitus without complications: Secondary | ICD-10-CM

## 2016-03-15 LAB — COMPREHENSIVE METABOLIC PANEL
ALT: 15 U/L (ref 0–53)
AST: 17 U/L (ref 0–37)
Albumin: 4.2 g/dL (ref 3.5–5.2)
Alkaline Phosphatase: 80 U/L (ref 39–117)
BUN: 17 mg/dL (ref 6–23)
CO2: 31 mEq/L (ref 19–32)
Calcium: 9.1 mg/dL (ref 8.4–10.5)
Chloride: 102 mEq/L (ref 96–112)
Creatinine, Ser: 1.06 mg/dL (ref 0.40–1.50)
GFR: 75.45 mL/min (ref >60.00)
Glucose, Bld: 74 mg/dL (ref 70–99)
Potassium: 4.8 mEq/L (ref 3.5–5.1)
Sodium: 140 mEq/L (ref 135–145)
Total Bilirubin: 0.3 mg/dL (ref 0.2–1.2)
Total Protein: 6.8 g/dL (ref 6.0–8.3)

## 2016-03-15 LAB — CBC
HCT: 34.6 % — ABNORMAL LOW (ref 39.0–52.0)
Hemoglobin: 11.5 g/dL — ABNORMAL LOW (ref 13.0–17.0)
MCHC: 33.3 g/dL (ref 30.0–36.0)
MCV: 85.7 fl (ref 78.0–100.0)
Platelets: 341 10*3/uL (ref 150.0–400.0)
RBC: 4.04 Mil/uL — ABNORMAL LOW (ref 4.22–5.81)
RDW: 14.8 % (ref 11.5–15.5)
WBC: 8.3 10*3/uL (ref 4.0–10.5)

## 2016-03-15 LAB — LIPID PANEL
Cholesterol: 121 mg/dL (ref 0–200)
HDL: 29.9 mg/dL — ABNORMAL LOW (ref >39.00)
LDL Cholesterol: 61 mg/dL (ref 0–99)
NonHDL: 90.73
Total CHOL/HDL Ratio: 4
Triglycerides: 151 mg/dL — ABNORMAL HIGH (ref 0.0–149.0)
VLDL: 30.2 mg/dL (ref 0.0–40.0)

## 2016-03-15 LAB — MICROALBUMIN / CREATININE URINE RATIO
Creatinine,U: 57.3 mg/dL
Microalb Creat Ratio: 7.2 mg/g (ref 0.0–30.0)
Microalb, Ur: 4.1 mg/dL — ABNORMAL HIGH (ref 0.0–1.9)

## 2016-03-15 LAB — HEMOGLOBIN A1C: Hgb A1c MFr Bld: 7.9 % — ABNORMAL HIGH (ref 4.6–6.5)

## 2016-03-15 LAB — TSH: TSH: 1.98 u[IU]/mL (ref 0.35–4.50)

## 2016-03-15 LAB — PSA: PSA: 5.16 ng/mL — ABNORMAL HIGH (ref 0.10–4.00)

## 2016-03-19 ENCOUNTER — Ambulatory Visit (INDEPENDENT_AMBULATORY_CARE_PROVIDER_SITE_OTHER): Payer: Federal, State, Local not specified - PPO | Admitting: Family Medicine

## 2016-03-19 ENCOUNTER — Encounter: Payer: Self-pay | Admitting: Family Medicine

## 2016-03-19 VITALS — BP 132/70 | HR 74 | Temp 98.2°F | Ht 71.0 in | Wt 227.5 lb

## 2016-03-19 DIAGNOSIS — I10 Essential (primary) hypertension: Secondary | ICD-10-CM | POA: Diagnosis not present

## 2016-03-19 DIAGNOSIS — E782 Mixed hyperlipidemia: Secondary | ICD-10-CM

## 2016-03-19 DIAGNOSIS — E669 Obesity, unspecified: Secondary | ICD-10-CM

## 2016-03-19 DIAGNOSIS — E1169 Type 2 diabetes mellitus with other specified complication: Secondary | ICD-10-CM

## 2016-03-19 DIAGNOSIS — K219 Gastro-esophageal reflux disease without esophagitis: Secondary | ICD-10-CM

## 2016-03-19 DIAGNOSIS — E119 Type 2 diabetes mellitus without complications: Secondary | ICD-10-CM

## 2016-03-19 DIAGNOSIS — Z0000 Encounter for general adult medical examination without abnormal findings: Secondary | ICD-10-CM

## 2016-03-19 DIAGNOSIS — F17200 Nicotine dependence, unspecified, uncomplicated: Secondary | ICD-10-CM

## 2016-03-19 DIAGNOSIS — R972 Elevated prostate specific antigen [PSA]: Secondary | ICD-10-CM

## 2016-03-19 DIAGNOSIS — G4700 Insomnia, unspecified: Secondary | ICD-10-CM

## 2016-03-19 NOTE — Progress Notes (Signed)
Pre visit review using our clinic review tool, if applicable. No additional management support is needed unless otherwise documented below in the visit note. 

## 2016-03-19 NOTE — Progress Notes (Signed)
Subjective:    Patient ID: Gene [name], male    DOB: 1956/10/01, 60 y.o.   MRN: [id number]  Chief Complaint  Patient presents with  . Annual Exam    HPI Patient is in today for Annual Exam.  Patient is seeing Dr. Sharol Given for crushed cartilage in knee, so patient currently has a knee brace on the left knee, patient reports has had past knee surgery on the right knee.  Patient runs between 150-170 nothing lower than 70.  Patient reports he is having loose stools but this is normal for him.  Patient reports his psorsis has been acting up but using the cream as prescribed to help treat.  Denies CP/palp/SOB/HA/congestion/fevers/GI or GU c/o. Taking meds as prescribed. He has just retired and is pleased but acknowledges he has been less active   Past Medical History  Diagnosis Date  . History of colonic polyps   . Diabetes mellitus type II   . GERD (gastroesophageal reflux disease)   . Hyperlipidemia   . Hypertension   . CAD (coronary artery disease)   . PVD (peripheral vascular disease) ([place])     PTA RLE 12/08 [place]  . Diabetic neuropathy ([place])   . Plantar fasciitis   . Chronic pain in right foot   . Diabetes ([place])   . Anemia   . Neuromuscular disorder ([place])     NEUROPATHY  . Insomnia 03/25/2013  . Preventative health care 05/03/2014    Past Surgical History  Procedure Laterality Date  . Coronary artery bypass graft      200 SVG to diagonal, LIMA to LAD, SVG to LCX  . Other surgical history      PAD surgery  . Revision total knee arthroplasty      Family History  Problem Relation Age of Onset  . Hyperlipidemia    . Hypertension    . Stroke    . Coronary artery disease Mother   . Hypertension Mother   . Hyperlipidemia Father   . Aneurysm Father     brain  . Colon cancer Neg Hx   . Esophageal cancer Neg Hx   . Cancer Maternal Uncle     Social History   Social History  . Marital Status: Married    Spouse Name: N/A  . Number of Children: N/A  . Years of  Education: N/A   Occupational History  . Not on file.   Social History Main Topics  . Smoking status: Former Smoker -- 0.50 packs/day for 40 years    Types: Cigarettes    Quit date: 05/04/2015  . Smokeless tobacco: Never Used  . Alcohol Use: No  . Drug Use: No     Comment: rare  . Sexual Activity: Yes     Comment: lives with wife, retired    Other Topics Concern  . Not on file   Social History Narrative   Occupation: grocery clerk   Second marriage - 3 children from first marriage   Tobacco Use - Yes.     Alcohol Use - yes(rare)            Outpatient Prescriptions Prior to Visit  Medication Sig Dispense Refill  . amLODipine (NORVASC) 10 MG tablet TAKE ONE TABLET BY MOUTH ONCE DAILY 30 tablet 0  . atorvastatin (LIPITOR) 10 MG tablet TAKE ONE TABLET BY MOUTH ONCE DAILY 90 tablet 0  . BD ULTRA-FINE LANCETS lancets Check daily and prn  DX 250.00 100 each 1  . calcipotriene (DOVONOX)  0.005 % cream APPLY CREAM TOPICALLY TWICE DAILY. PATIENT NEEDS AN APPOINTMENT 60 g 0  . fish oil-omega-3 fatty acids 1000 MG capsule Take 1 g by mouth at bedtime.      . gabapentin (NEURONTIN) 600 MG tablet Take 1 tablet (600 mg total) by mouth 4 (four) times daily. 360 tablet 0  . glimepiride (AMARYL) 4 MG tablet TAKE ONE TABLET BY MOUTH ONCE DAILY WITH BREAKFAST 30 tablet 0  . labetalol (NORMODYNE) 200 MG tablet TAKE ONE TABLET BY MOUTH TWICE DAILY 180 tablet 0  . LANTUS SOLOSTAR 100 UNIT/ML Solostar Pen INJECT 32 UNITS SUBCUTANEOUSLY ONCE DAILY AT 10 PM. 15 mL 5  . lisinopril (PRINIVIL,ZESTRIL) 40 MG tablet TAKE ONE TABLET BY MOUTH ONCE DAILY 30 tablet 0  . metFORMIN (GLUCOPHAGE XR) 500 MG 24 hr tablet Take 2 tablets (1,000 mg total) by mouth 2 (two) times daily. 120 tablet 1  . Multiple Vitamins-Minerals (CENTRUM PO) Take by mouth daily.      . nitroGLYCERIN (NITROSTAT) 0.4 MG SL tablet Place 0.4 mg under the tongue every 5 (five) minutes as needed.      Marland Kitchen omeprazole (PRILOSEC) 40 MG capsule  TAKE ONE CAPSULE BY MOUTH ONCE DAILY 30 capsule 6  . RELION PEN NEEDLES 29G X 12MM MISC USE PEN NEEDLES ONCE DAILY IN THE EVENING AS DIRECTED 100 each 0  . traMADol (ULTRAM) 50 MG tablet Take 1 tablet (50 mg total) by mouth every 8 (eight) hours as needed. 30 tablet 0  . famciclovir (FAMVIR) 500 MG tablet Take 1 tablet (500 mg total) by mouth 3 (three) times daily. 21 tablet 0  . labetalol (NORMODYNE) 200 MG tablet TAKE ONE TABLET BY MOUTH TWICE DAILY 180 tablet 0   No facility-administered medications prior to visit.    No Known Allergies  Review of Systems  Constitutional: Negative for fever and malaise/fatigue.  HENT: Negative for congestion.   Eyes: Negative for blurred vision.  Respiratory: Negative for shortness of breath.   Cardiovascular: Negative for chest pain, palpitations and leg swelling.  Gastrointestinal: Negative for nausea, abdominal pain and blood in stool.  Genitourinary: Negative for dysuria and frequency.  Musculoskeletal: Negative for falls.  Skin: Negative for rash.  Neurological: Negative for dizziness, loss of consciousness and headaches.  Endo/Heme/Allergies: Negative for environmental allergies.  Psychiatric/Behavioral: Negative for depression. The patient is not nervous/anxious.        Objective:    Physical Exam  Constitutional: He is oriented to person, place, and time. He appears well-developed and well-nourished. No distress.  HENT:  Head: Normocephalic and atraumatic.  Eyes: Conjunctivae are normal.  Neck: Neck supple. No thyromegaly present.  Cardiovascular: Normal rate, regular rhythm and normal heart sounds.   No murmur heard. Pulmonary/Chest: Effort normal and breath sounds normal. No respiratory distress. He has no wheezes.  Abdominal: Soft. Bowel sounds are normal. He exhibits no mass. There is no tenderness.  Musculoskeletal: He exhibits no edema.  Lymphadenopathy:    He has no cervical adenopathy.  Neurological: He is alert and  oriented to person, place, and time.  Skin: Skin is warm and dry.  Psychiatric: He has a normal mood and affect. His behavior is normal.    BP 132/70 mmHg  Pulse 74  Temp(Src) 98.2 F (36.8 C) (Oral)  Ht 5\' 11"  (1.803 m)  Wt 227 lb 8 oz (103.193 kg)  BMI 31.74 kg/m2  SpO2 97% Wt Readings from Last 3 Encounters:  03/19/16 227 lb 8 oz (103.193 kg)  01/13/16  222 lb 9.6 oz (100.971 kg)  11/03/15 233 lb 9.6 oz (105.96 kg)     Lab Results  Component Value Date   WBC 8.3 03/15/2016   HGB 11.5* 03/15/2016   HCT 34.6* 03/15/2016   PLT 341.0 03/15/2016   GLUCOSE 74 03/15/2016   CHOL 121 03/15/2016   TRIG 151.0* 03/15/2016   HDL 29.90* 03/15/2016   LDLCALC 61 03/15/2016   ALT 15 03/15/2016   AST 17 03/15/2016   NA 140 03/15/2016   K 4.8 03/15/2016   CL 102 03/15/2016   CREATININE 1.06 03/15/2016   BUN 17 03/15/2016   CO2 31 03/15/2016   TSH 1.98 03/15/2016   PSA 5.16* 03/15/2016   INR 1.1 07/31/2008   HGBA1C 7.9* 03/15/2016   MICROALBUR 4.1* 03/15/2016    Lab Results  Component Value Date   TSH 1.98 03/15/2016   Lab Results  Component Value Date   WBC 8.3 03/15/2016   HGB 11.5* 03/15/2016   HCT 34.6* 03/15/2016   MCV 85.7 03/15/2016   PLT 341.0 03/15/2016   Lab Results  Component Value Date   NA 140 03/15/2016   K 4.8 03/15/2016   CO2 31 03/15/2016   GLUCOSE 74 03/15/2016   BUN 17 03/15/2016   CREATININE 1.06 03/15/2016   BILITOT 0.3 03/15/2016   ALKPHOS 80 03/15/2016   AST 17 03/15/2016   ALT 15 03/15/2016   PROT 6.8 03/15/2016   ALBUMIN 4.2 03/15/2016   CALCIUM 9.1 03/15/2016   GFR 75.45 03/15/2016   Lab Results  Component Value Date   CHOL 121 03/15/2016   Lab Results  Component Value Date   HDL 29.90* 03/15/2016   Lab Results  Component Value Date   LDLCALC 61 03/15/2016   Lab Results  Component Value Date   TRIG 151.0* 03/15/2016   Lab Results  Component Value Date   CHOLHDL 4 03/15/2016   Lab Results  Component Value Date    HGBA1C 7.9* 03/15/2016       Assessment & Plan:   Problem List Items Addressed This Visit    TOBACCO USER    Still not smoking      Relevant Orders   PSA, total and free   CBC   TSH   Lipid panel   Comprehensive metabolic panel   Hemoglobin A1c   Preventative health care    Patient encouraged to maintain heart healthy diet, regular exercise, adequate sleep. Consider daily probiotics. Take medications as prescribed      Relevant Orders   PSA, total and free   CBC   TSH   Lipid panel   Comprehensive metabolic panel   Hemoglobin A1c   Insomnia    Encouraged good sleep hygiene such as dark, quiet room. No blue/green glowing lights such as computer screens in bedroom. No alcohol or stimulants in evening. Cut down on caffeine as able. Regular exercise is helpful but not just prior to bed time.       Relevant Orders   PSA, total and free   CBC   TSH   Lipid panel   Comprehensive metabolic panel   Hemoglobin A1c   Hyperlipidemia, mixed - Primary    Tolerating statin, encouraged heart healthy diet, avoid trans fats, minimize simple carbs and saturated fats. Increase exercise as tolerated      Relevant Orders   PSA, total and free   CBC   TSH   Lipid panel   Comprehensive metabolic panel   Hemoglobin A1c   GERD  Avoid offending foods, start probiotics. Do not eat large meals in late evening and consider raising head of bed.       Relevant Orders   PSA, total and free   CBC   TSH   Lipid panel   Comprehensive metabolic panel   Hemoglobin A1c   Essential hypertension    Well controlled, no changes to meds. Encouraged heart healthy diet such as the DASH diet and exercise as tolerated.       Relevant Orders   PSA, total and free   CBC   TSH   Lipid panel   Comprehensive metabolic panel   Hemoglobin A1c   Elevated PSA    Declines referral at this time      Relevant Orders   PSA, total and free   CBC   TSH   Lipid panel   Comprehensive metabolic  panel   Hemoglobin A1c   Diabetes mellitus type 2 in obese ([place])    Encouraged DASH diet, decrease po intake and increase exercise as tolerated. Needs 7-8 hours of sleep nightly. Avoid trans fats, eat small, frequent meals every 4-5 hours with lean proteins, complex carbs and healthy fats. Minimize simple carbs. hgba1c acceptable, minimize simple carbs. Increase exercise as tolerated. Continue current meds         I have discontinued Mr. Walker famciclovir. I am also having him maintain his Multiple Vitamins-Minerals (CENTRUM PO), nitroGLYCERIN, fish oil-omega-3 fatty acids, BD ULTRA-FINE LANCETS, omeprazole, LANTUS SOLOSTAR, atorvastatin, labetalol, RELION PEN NEEDLES, traMADol, glimepiride, lisinopril, amLODipine, calcipotriene, metFORMIN, and gabapentin.  No orders of the defined types were placed in this encounter.     Penni Homans, MD

## 2016-03-19 NOTE — Assessment & Plan Note (Signed)
Well controlled, no changes to meds. Encouraged heart healthy diet such as the DASH diet and exercise as tolerated.  °

## 2016-03-19 NOTE — Assessment & Plan Note (Signed)
Encouraged DASH diet, decrease po intake and increase exercise as tolerated. Needs 7-8 hours of sleep nightly. Avoid trans fats, eat small, frequent meals every 4-5 hours with lean proteins, complex carbs and healthy fats. Minimize simple carbs. hgba1c acceptable, minimize simple carbs. Increase exercise as tolerated. Continue current meds

## 2016-03-19 NOTE — Assessment & Plan Note (Signed)
Tolerating statin, encouraged heart healthy diet, avoid trans fats, minimize simple carbs and saturated fats. Increase exercise as tolerated 

## 2016-03-19 NOTE — Assessment & Plan Note (Signed)
Patient encouraged to maintain heart healthy diet, regular exercise, adequate sleep. Consider daily probiotics. Take medications as prescribed 

## 2016-03-19 NOTE — Assessment & Plan Note (Signed)
Declines referral at this time 

## 2016-03-19 NOTE — Assessment & Plan Note (Signed)
Still not smoking.

## 2016-03-19 NOTE — Assessment & Plan Note (Signed)
Encouraged good sleep hygiene such as dark, quiet room. No blue/green glowing lights such as computer screens in bedroom. No alcohol or stimulants in evening. Cut down on caffeine as able. Regular exercise is helpful but not just prior to bed time.  

## 2016-03-19 NOTE — Patient Instructions (Addendum)
NOW Probiotic Vitamin. Available at [facility] for Diabetes Mellitus Carbohydrate counting is a method for keeping track of the amount of carbohydrates you eat. Eating carbohydrates naturally increases the level of sugar (glucose) in your blood, so it is important for you to know the amount that is okay for you to have in every meal. Carbohydrate counting helps keep the level of glucose in your blood within normal limits. The amount of carbohydrates allowed is different for every person. A dietitian can help you calculate the amount that is right for you. Once you know the amount of carbohydrates you can have, you can count the carbohydrates in the foods you want to eat. Carbohydrates are found in the following foods:  Grains, such as breads and cereals.  Dried beans and soy products.  Starchy vegetables, such as potatoes, peas, and corn.  Fruit and fruit juices.  Milk and yogurt.  Sweets and snack foods, such as cake, cookies, candy, chips, soft drinks, and fruit drinks. CARBOHYDRATE COUNTING There are two ways to count the carbohydrates in your food. You can use either of the methods or a combination of both. Reading the "Nutrition Facts" on [place] The "Nutrition Facts" is an area that is included on the labels of almost all packaged food and beverages in the [place]. It includes the serving size of that food or beverage and information about the nutrients in each serving of the food, including the grams (g) of carbohydrate per serving.  Decide the number of servings of this food or beverage that you will be able to eat or drink. Multiply that number of servings by the number of grams of carbohydrate that is listed on the label for that serving. The total will be the amount of carbohydrates you will be having when you eat or drink this food or beverage. Learning Standard Serving Sizes of Food When you eat food that is not packaged or does not  include "Nutrition Facts" on the label, you need to measure the servings in order to count the amount of carbohydrates.A serving of most carbohydrate-rich foods contains about 15 g of carbohydrates. The following list includes serving sizes of carbohydrate-rich foods that provide 15 g ofcarbohydrate per serving:   1 slice of bread (1 oz) or 1 six-inch tortilla.    of a hamburger bun or English muffin.  4-6 crackers.   cup unsweetened dry cereal.    cup hot cereal.   cup rice or pasta.    cup mashed potatoes or  of a large baked potato.  1 cup fresh fruit or one small piece of fruit.    cup canned or frozen fruit or fruit juice.  1 cup milk.   cup plain fat-free yogurt or yogurt sweetened with artificial sweeteners.   cup cooked dried beans or starchy vegetable, such as peas, corn, or potatoes.  Decide the number of standard-size servings that you will eat. Multiply that number of servings by 15 (the grams of carbohydrates in that serving). For example, if you eat 2 cups of strawberries, you will have eaten 2 servings and 30 g of carbohydrates (2 servings x 15 g = 30 g). For foods such as soups and casseroles, in which more than one food is mixed in, you will need to count the carbohydrates in each food that is included. EXAMPLE OF CARBOHYDRATE COUNTING Sample Dinner  3 oz chicken breast.   cup of brown rice.   cup of corn.  1 cup  milk.   1 cup strawberries with sugar-free whipped topping.  Carbohydrate Calculation Step 1: Identify the foods that contain carbohydrates:   Rice.   Corn.   Milk.   Strawberries. Step 2:Calculate the number of servings eaten of each:   2 servings of rice.   1 serving of corn.   1 serving of milk.   1 serving of strawberries. Step 3: Multiply each of those number of servings by 15 g:   2 servings of rice x 15 g = 30 g.   1 serving of corn x 15 g = 15 g.   1 serving of milk x 15 g = 15 g.   1  serving of strawberries x 15 g = 15 g. Step 4: Add together all of the amounts to find the total grams of carbohydrates eaten: 30 g + 15 g + 15 g + 15 g = 75 g.   This information is not intended to replace advice given to you by your health care provider. Make sure you discuss any questions you have with your health care provider.   Document Released: 11/01/2005 Document Revised: 11/22/2014 Document Reviewed: 09/28/2013 Elsevier Interactive Patient Education [employer].

## 2016-03-19 NOTE — Assessment & Plan Note (Signed)
Avoid offending foods, start probiotics. Do not eat large meals in late evening and consider raising head of bed.  

## 2016-03-25 ENCOUNTER — Encounter: Payer: Self-pay | Admitting: Family Medicine

## 2016-03-29 ENCOUNTER — Other Ambulatory Visit: Payer: Self-pay | Admitting: Family Medicine

## 2016-04-28 ENCOUNTER — Other Ambulatory Visit: Payer: Self-pay | Admitting: Family Medicine

## 2016-06-01 ENCOUNTER — Other Ambulatory Visit: Payer: Self-pay | Admitting: Family Medicine

## 2016-06-02 ENCOUNTER — Other Ambulatory Visit: Payer: Self-pay

## 2016-06-02 MED ORDER — METFORMIN HCL ER 500 MG PO TB24: 1000.0000 mg | ORAL_TABLET | Freq: Two times a day (BID) | ORAL | Status: DC

## 2016-06-15 ENCOUNTER — Other Ambulatory Visit (INDEPENDENT_AMBULATORY_CARE_PROVIDER_SITE_OTHER): Payer: Federal, State, Local not specified - PPO

## 2016-06-15 DIAGNOSIS — I10 Essential (primary) hypertension: Secondary | ICD-10-CM | POA: Diagnosis not present

## 2016-06-15 DIAGNOSIS — G4700 Insomnia, unspecified: Secondary | ICD-10-CM

## 2016-06-15 DIAGNOSIS — E782 Mixed hyperlipidemia: Secondary | ICD-10-CM | POA: Diagnosis not present

## 2016-06-15 DIAGNOSIS — R972 Elevated prostate specific antigen [PSA]: Secondary | ICD-10-CM

## 2016-06-15 DIAGNOSIS — K219 Gastro-esophageal reflux disease without esophagitis: Secondary | ICD-10-CM

## 2016-06-15 DIAGNOSIS — F17200 Nicotine dependence, unspecified, uncomplicated: Secondary | ICD-10-CM | POA: Diagnosis not present

## 2016-06-15 DIAGNOSIS — Z0000 Encounter for general adult medical examination without abnormal findings: Secondary | ICD-10-CM

## 2016-06-15 LAB — COMPREHENSIVE METABOLIC PANEL
ALT: 14 U/L (ref 0–53)
AST: 17 U/L (ref 0–37)
Albumin: 4.2 g/dL (ref 3.5–5.2)
Alkaline Phosphatase: 80 U/L (ref 39–117)
BUN: 20 mg/dL (ref 6–23)
CO2: 30 mEq/L (ref 19–32)
Calcium: 9.2 mg/dL (ref 8.4–10.5)
Chloride: 102 mEq/L (ref 96–112)
Creatinine, Ser: 1.13 mg/dL (ref 0.40–1.50)
GFR: 70.02 mL/min (ref >60.00)
Glucose, Bld: 92 mg/dL (ref 70–99)
Potassium: 4.9 mEq/L (ref 3.5–5.1)
Sodium: 140 mEq/L (ref 135–145)
Total Bilirubin: 0.3 mg/dL (ref 0.2–1.2)
Total Protein: 7.3 g/dL (ref 6.0–8.3)

## 2016-06-15 LAB — CBC
HCT: 33.6 % — ABNORMAL LOW (ref 39.0–52.0)
Hemoglobin: 11.3 g/dL — ABNORMAL LOW (ref 13.0–17.0)
MCHC: 33.5 g/dL (ref 30.0–36.0)
MCV: 86.2 fl (ref 78.0–100.0)
Platelets: 400 10*3/uL (ref 150.0–400.0)
RBC: 3.9 Mil/uL — ABNORMAL LOW (ref 4.22–5.81)
RDW: 14.8 % (ref 11.5–15.5)
WBC: 7.9 10*3/uL (ref 4.0–10.5)

## 2016-06-15 LAB — TSH: TSH: 1.81 u[IU]/mL (ref 0.35–4.50)

## 2016-06-15 LAB — LIPID PANEL
Cholesterol: 129 mg/dL (ref 0–200)
HDL: 30.1 mg/dL — ABNORMAL LOW (ref >39.00)
LDL Cholesterol: 66 mg/dL (ref 0–99)
NonHDL: 99.15
Total CHOL/HDL Ratio: 4
Triglycerides: 166 mg/dL — ABNORMAL HIGH (ref 0.0–149.0)
VLDL: 33.2 mg/dL (ref 0.0–40.0)

## 2016-06-15 LAB — HEMOGLOBIN A1C: Hgb A1c MFr Bld: 7.4 % — ABNORMAL HIGH (ref 4.6–6.5)

## 2016-06-16 LAB — PSA, TOTAL AND FREE
PSA, Free Pct: 13 % — ABNORMAL LOW (ref >25)
PSA, Free: 0.8 ng/mL
PSA: 5.98 ng/mL — ABNORMAL HIGH (ref <=4.00)

## 2016-06-24 ENCOUNTER — Encounter: Payer: Self-pay | Admitting: Family Medicine

## 2016-06-24 ENCOUNTER — Other Ambulatory Visit: Payer: Self-pay | Admitting: Family Medicine

## 2016-06-24 ENCOUNTER — Ambulatory Visit (INDEPENDENT_AMBULATORY_CARE_PROVIDER_SITE_OTHER): Payer: Federal, State, Local not specified - PPO | Admitting: Family Medicine

## 2016-06-24 VITALS — BP 142/68 | HR 78 | Temp 97.9°F | Resp 16 | Ht 73.0 in | Wt 234.4 lb

## 2016-06-24 DIAGNOSIS — E1169 Type 2 diabetes mellitus with other specified complication: Secondary | ICD-10-CM

## 2016-06-24 DIAGNOSIS — D649 Anemia, unspecified: Secondary | ICD-10-CM

## 2016-06-24 DIAGNOSIS — E119 Type 2 diabetes mellitus without complications: Secondary | ICD-10-CM | POA: Diagnosis not present

## 2016-06-24 DIAGNOSIS — E663 Overweight: Secondary | ICD-10-CM | POA: Insufficient documentation

## 2016-06-24 DIAGNOSIS — E669 Obesity, unspecified: Secondary | ICD-10-CM | POA: Insufficient documentation

## 2016-06-24 DIAGNOSIS — R972 Elevated prostate specific antigen [PSA]: Secondary | ICD-10-CM

## 2016-06-24 DIAGNOSIS — E782 Mixed hyperlipidemia: Secondary | ICD-10-CM | POA: Diagnosis not present

## 2016-06-24 DIAGNOSIS — F17200 Nicotine dependence, unspecified, uncomplicated: Secondary | ICD-10-CM

## 2016-06-24 DIAGNOSIS — I10 Essential (primary) hypertension: Secondary | ICD-10-CM | POA: Diagnosis not present

## 2016-06-24 DIAGNOSIS — Z23 Encounter for immunization: Secondary | ICD-10-CM

## 2016-06-24 DIAGNOSIS — K219 Gastro-esophageal reflux disease without esophagitis: Secondary | ICD-10-CM

## 2016-06-24 HISTORY — DX: Obesity, unspecified: E66.9

## 2016-06-24 HISTORY — DX: Overweight: E66.3

## 2016-06-24 NOTE — Progress Notes (Signed)
Pre visit review using our clinic review tool, if applicable. No additional management support is needed unless otherwise documented below in the visit note. 

## 2016-06-24 NOTE — Assessment & Plan Note (Signed)
Still smokes an occasional cigar. Encouraged complete cessation

## 2016-06-24 NOTE — Assessment & Plan Note (Addendum)
Continues to trend up but patient declines referral at this time. No urinary complaints today. Will recheck at next visit. Has seen [facility] urology in past

## 2016-06-24 NOTE — Assessment & Plan Note (Signed)
Tolerating statin, encouraged heart healthy diet, avoid trans fats, minimize simple carbs and saturated fats. Increase exercise as tolerated 

## 2016-06-24 NOTE — Assessment & Plan Note (Signed)
hgba1c improved abut still above 7, minimize simple carbs. Increase exercise as tolerated. Continue current meds. Only checks sugar in am. Encouraged to check sugars after meals to learn where he spikes

## 2016-06-24 NOTE — Assessment & Plan Note (Signed)
Encouraged DASH diet, decrease po intake and increase exercise as tolerated. Needs 7-8 hours of sleep nightly. Avoid trans fats, eat small, frequent meals every 4-5 hours with lean proteins, complex carbs and healthy fats. Minimize simple carbs 

## 2016-06-24 NOTE — Patient Instructions (Signed)
NOW company probiotics daily at [facility] for Diabetes Mellitus Carbohydrate counting is a method for keeping track of the amount of carbohydrates you eat. Eating carbohydrates naturally increases the level of sugar (glucose) in your blood, so it is important for you to know the amount that is okay for you to have in every meal. Carbohydrate counting helps keep the level of glucose in your blood within normal limits. The amount of carbohydrates allowed is different for every person. A dietitian can help you calculate the amount that is right for you. Once you know the amount of carbohydrates you can have, you can count the carbohydrates in the foods you want to eat. Carbohydrates are found in the following foods:  Grains, such as breads and cereals.  Dried beans and soy products.  Starchy vegetables, such as potatoes, peas, and corn.  Fruit and fruit juices.  Milk and yogurt.  Sweets and snack foods, such as cake, cookies, candy, chips, soft drinks, and fruit drinks. CARBOHYDRATE COUNTING There are two ways to count the carbohydrates in your food. You can use either of the methods or a combination of both. Reading the "Nutrition Facts" on [place] The "Nutrition Facts" is an area that is included on the labels of almost all packaged food and beverages in the [place]. It includes the serving size of that food or beverage and information about the nutrients in each serving of the food, including the grams (g) of carbohydrate per serving.  Decide the number of servings of this food or beverage that you will be able to eat or drink. Multiply that number of servings by the number of grams of carbohydrate that is listed on the label for that serving. The total will be the amount of carbohydrates you will be having when you eat or drink this food or beverage. Learning Standard Serving Sizes of Food When you eat food that is not packaged or does not  include "Nutrition Facts" on the label, you need to measure the servings in order to count the amount of carbohydrates.A serving of most carbohydrate-rich foods contains about 15 g of carbohydrates. The following list includes serving sizes of carbohydrate-rich foods that provide 15 g ofcarbohydrate per serving:   1 slice of bread (1 oz) or 1 six-inch tortilla.    of a hamburger bun or English muffin.  4-6 crackers.   cup unsweetened dry cereal.    cup hot cereal.   cup rice or pasta.    cup mashed potatoes or  of a large baked potato.  1 cup fresh fruit or one small piece of fruit.    cup canned or frozen fruit or fruit juice.  1 cup milk.   cup plain fat-free yogurt or yogurt sweetened with artificial sweeteners.   cup cooked dried beans or starchy vegetable, such as peas, corn, or potatoes.  Decide the number of standard-size servings that you will eat. Multiply that number of servings by 15 (the grams of carbohydrates in that serving). For example, if you eat 2 cups of strawberries, you will have eaten 2 servings and 30 g of carbohydrates (2 servings x 15 g = 30 g). For foods such as soups and casseroles, in which more than one food is mixed in, you will need to count the carbohydrates in each food that is included. EXAMPLE OF CARBOHYDRATE COUNTING Sample Dinner  3 oz chicken breast.   cup of brown rice.   cup of corn.  1 cup milk.   1 cup strawberries with sugar-free whipped topping.  Carbohydrate Calculation Step 1: Identify the foods that contain carbohydrates:   Rice.   Corn.   Milk.   Strawberries. Step 2:Calculate the number of servings eaten of each:   2 servings of rice.   1 serving of corn.   1 serving of milk.   1 serving of strawberries. Step 3: Multiply each of those number of servings by 15 g:   2 servings of rice x 15 g = 30 g.   1 serving of corn x 15 g = 15 g.   1 serving of milk x 15 g = 15 g.   1  serving of strawberries x 15 g = 15 g. Step 4: Add together all of the amounts to find the total grams of carbohydrates eaten: 30 g + 15 g + 15 g + 15 g = 75 g.   This information is not intended to replace advice given to you by your health care provider. Make sure you discuss any questions you have with your health care provider.   Document Released: 11/01/2005 Document Revised: 11/22/2014 Document Reviewed: 09/28/2013 Elsevier Interactive Patient Education [employer].

## 2016-06-25 ENCOUNTER — Other Ambulatory Visit: Payer: Self-pay | Admitting: Family Medicine

## 2016-06-25 MED ORDER — METFORMIN HCL ER 500 MG PO TB24: 1000.0000 mg | ORAL_TABLET | Freq: Two times a day (BID) | ORAL | 2 refills | Status: DC

## 2016-06-25 MED ORDER — GABAPENTIN 600 MG PO TABS: 600.0000 mg | ORAL_TABLET | Freq: Four times a day (QID) | ORAL | 0 refills | Status: DC

## 2016-07-01 ENCOUNTER — Other Ambulatory Visit: Payer: Self-pay | Admitting: Family Medicine

## 2016-07-04 NOTE — Assessment & Plan Note (Signed)
Well controlled, no changes to meds. Encouraged heart healthy diet such as the DASH diet and exercise as tolerated.  °

## 2016-07-04 NOTE — Assessment & Plan Note (Signed)
Increase leafy greens, consider increased lean red meat and using cast iron cookware. Continue to monitor, report any concerns 

## 2016-07-04 NOTE — Assessment & Plan Note (Signed)
Avoid offending foods, start probiotics. Do not eat large meals in late evening and consider raising head of bed.  

## 2016-07-04 NOTE — Progress Notes (Signed)
Patient ID: Gene [name], male   DOB: June 16, 1956, 60 y.o.   MRN: [id number]   Subjective:    Patient ID: Gene [name], male    DOB: [birth year], 60 y.o.   MRN: [id number]  Chief Complaint  Patient presents with  . Diabetes    fasting.    HPI Patient is in today for follow up. No recent illness or acute complaints. Continues to struggle with some knee and hip ain at times. Denies CP/palp/SOB/HA/congestion/fevers/GI or GU c/o. Taking meds as prescribed  Past Medical History:  Diagnosis Date  . Anemia   . CAD (coronary artery disease)   . Chronic pain in right foot   . Diabetes ([place])   . Diabetes mellitus type II   . Diabetic neuropathy ([place])   . GERD (gastroesophageal reflux disease)   . History of colonic polyps   . Hyperlipidemia   . Hypertension   . Insomnia 03/25/2013  . Neuromuscular disorder ([place])    NEUROPATHY  . Obesity 06/24/2016  . Plantar fasciitis   . Preventative health care 05/03/2014  . PVD (peripheral vascular disease) ([place])    PTA RLE 12/08 [place]  . TOBACCO USER 05/21/2009   Qualifier: Diagnosis of  By: Wynona Luna  Last cigarette smoked May 30, 2015      Past Surgical History:  Procedure Laterality Date  . CORONARY ARTERY BYPASS GRAFT     200 SVG to diagonal, LIMA to LAD, SVG to LCX  . OTHER SURGICAL HISTORY     PAD surgery  . REVISION TOTAL KNEE ARTHROPLASTY      Family History  Problem Relation Age of Onset  . Hyperlipidemia    . Hypertension    . Stroke    . Coronary artery disease Mother   . Hypertension Mother   . Hyperlipidemia Father   . Aneurysm Father     brain  . Colon cancer Neg Hx   . Esophageal cancer Neg Hx   . Cancer Maternal Uncle     Social History   Social History  . Marital status: Married    Spouse name: N/A  . Number of children: N/A  . Years of education: N/A   Occupational History  . Not on file.   Social History Main Topics  . Smoking status: Former Smoker    Packs/day: 0.50    Years: 40.00      Types: Cigarettes    Quit date: 05/04/2015  . Smokeless tobacco: Never Used  . Alcohol use No  . Drug use: No     Comment: rare  . Sexual activity: Yes     Comment: lives with wife, retired    Other Topics Concern  . Not on file   Social History Narrative   Occupation: grocery clerk   Second marriage - 3 children from first marriage   Tobacco Use - Yes.     Alcohol Use - yes(rare)            Outpatient Medications Prior to Visit  Medication Sig Dispense Refill  . amLODipine (NORVASC) 10 MG tablet TAKE ONE TABLET BY MOUTH ONCE DAILY 90 tablet 2  . atorvastatin (LIPITOR) 10 MG tablet TAKE ONE TABLET BY MOUTH ONCE DAILY 90 tablet 2  . BD ULTRA-FINE LANCETS lancets Check daily and prn  DX 250.00 100 each 1  . calcipotriene (DOVONOX) 0.005 % cream APPLY CREAM TOPICALLY TWICE DAILY. PATIENT NEEDS AN APPOINTMENT 60 g 0  . fish oil-omega-3 fatty acids 1000 MG  capsule Take 1 g by mouth at bedtime.      Marland Kitchen glimepiride (AMARYL) 4 MG tablet TAKE ONE TABLET BY MOUTH ONCE DAILY WITH BREAKFAST 30 tablet 5  . labetalol (NORMODYNE) 200 MG tablet TAKE ONE TABLET BY MOUTH TWICE DAILY 180 tablet 2  . LANTUS SOLOSTAR 100 UNIT/ML Solostar Pen INJECT 32 UNITS SUBCUTANEOUSLY ONCE DAILY AT 10 PM. 15 mL 5  . lisinopril (PRINIVIL,ZESTRIL) 40 MG tablet TAKE ONE TABLET BY MOUTH ONCE DAILY 30 tablet 5  . Multiple Vitamins-Minerals (CENTRUM PO) Take by mouth daily.      . nitroGLYCERIN (NITROSTAT) 0.4 MG SL tablet Place 0.4 mg under the tongue every 5 (five) minutes as needed.      Marland Kitchen RELION PEN NEEDLES 29G X 12MM MISC USE PEN NEEDLES ONCE DAILY IN THE EVENING AS DIRECTED 100 each 0  . gabapentin (NEURONTIN) 600 MG tablet Take 1 tablet (600 mg total) by mouth 4 (four) times daily. 360 tablet 0  . metFORMIN (GLUCOPHAGE-XR) 500 MG 24 hr tablet Take 2 tablets (1,000 mg total) by mouth 2 (two) times daily. 120 tablet 0  . omeprazole (PRILOSEC) 40 MG capsule TAKE ONE CAPSULE BY MOUTH ONCE DAILY 30 capsule 6  .  amLODipine (NORVASC) 10 MG tablet TAKE ONE TABLET BY MOUTH ONCE DAILY 30 tablet 0  . traMADol (ULTRAM) 50 MG tablet Take 1 tablet (50 mg total) by mouth every 8 (eight) hours as needed. (Patient not taking: Reported on 06/24/2016) 30 tablet 0   No facility-administered medications prior to visit.     No Known Allergies  Review of Systems  Constitutional: Negative for fever and malaise/fatigue.  HENT: Negative for congestion.   Eyes: Negative for blurred vision.  Respiratory: Negative for shortness of breath.   Cardiovascular: Negative for chest pain, palpitations and leg swelling.  Gastrointestinal: Negative for abdominal pain, blood in stool and nausea.  Genitourinary: Negative for dysuria and frequency.  Musculoskeletal: Positive for joint pain. Negative for falls.  Skin: Negative for rash.  Neurological: Negative for dizziness, loss of consciousness and headaches.  Endo/Heme/Allergies: Negative for environmental allergies.  Psychiatric/Behavioral: Negative for depression. The patient is not nervous/anxious.        Objective:    Physical Exam  Constitutional: He is oriented to person, place, and time. He appears well-developed and well-nourished. No distress.  HENT:  Head: Normocephalic and atraumatic.  Nose: Nose normal.  Eyes: Right eye exhibits no discharge. Left eye exhibits no discharge.  Neck: Normal range of motion. Neck supple.  Cardiovascular: Normal rate and regular rhythm.   No murmur heard. Pulmonary/Chest: Effort normal and breath sounds normal.  Abdominal: Soft. Bowel sounds are normal. There is no tenderness.  Musculoskeletal: He exhibits no edema.  Neurological: He is alert and oriented to person, place, and time.  Skin: Skin is warm and dry.  Psychiatric: He has a normal mood and affect.  Nursing note and vitals reviewed.   BP (P) 126/60 (BP Location: Left Arm, Patient Position: Sitting, Cuff Size: Large)   Pulse 78   Temp 97.9 F (36.6 C) (Oral)    Resp 16   Ht 6\' 1"  (1.854 m)   Wt 234 lb 6.4 oz (106.3 kg)   SpO2 98%   BMI 30.93 kg/m  Wt Readings from Last 3 Encounters:  06/24/16 234 lb 6.4 oz (106.3 kg)  03/19/16 227 lb 8 oz (103.2 kg)  01/13/16 222 lb 9.6 oz (101 kg)     Lab Results  Component Value Date  WBC 7.9 06/15/2016   HGB 11.3 (L) 06/15/2016   HCT 33.6 (L) 06/15/2016   PLT 400.0 06/15/2016   GLUCOSE 92 06/15/2016   CHOL 129 06/15/2016   TRIG 166.0 (H) 06/15/2016   HDL 30.10 (L) 06/15/2016   LDLCALC 66 06/15/2016   ALT 14 06/15/2016   AST 17 06/15/2016   NA 140 06/15/2016   K 4.9 06/15/2016   CL 102 06/15/2016   CREATININE 1.13 06/15/2016   BUN 20 06/15/2016   CO2 30 06/15/2016   TSH 1.81 06/15/2016   PSA 5.98 (H) 06/15/2016   INR 1.1 07/31/2008   HGBA1C 7.4 (H) 06/15/2016   MICROALBUR 4.1 (H) 03/15/2016    Lab Results  Component Value Date   TSH 1.81 06/15/2016   Lab Results  Component Value Date   WBC 7.9 06/15/2016   HGB 11.3 (L) 06/15/2016   HCT 33.6 (L) 06/15/2016   MCV 86.2 06/15/2016   PLT 400.0 06/15/2016   Lab Results  Component Value Date   NA 140 06/15/2016   K 4.9 06/15/2016   CO2 30 06/15/2016   GLUCOSE 92 06/15/2016   BUN 20 06/15/2016   CREATININE 1.13 06/15/2016   BILITOT 0.3 06/15/2016   ALKPHOS 80 06/15/2016   AST 17 06/15/2016   ALT 14 06/15/2016   PROT 7.3 06/15/2016   ALBUMIN 4.2 06/15/2016   CALCIUM 9.2 06/15/2016   GFR 70.02 06/15/2016   Lab Results  Component Value Date   CHOL 129 06/15/2016   Lab Results  Component Value Date   HDL 30.10 (L) 06/15/2016   Lab Results  Component Value Date   LDLCALC 66 06/15/2016   Lab Results  Component Value Date   TRIG 166.0 (H) 06/15/2016   Lab Results  Component Value Date   CHOLHDL 4 06/15/2016   Lab Results  Component Value Date   HGBA1C 7.4 (H) 06/15/2016       Assessment & Plan:   Problem List Items Addressed This Visit    Diabetes mellitus type 2 in obese ([place])    hgba1c improved abut  still above 7, minimize simple carbs. Increase exercise as tolerated. Continue current meds. Only checks sugar in am. Encouraged to check sugars after meals to learn where he spikes      Relevant Orders   Comprehensive metabolic panel   Hemoglobin A1c   Hyperlipidemia, mixed    Tolerating statin, encouraged heart healthy diet, avoid trans fats, minimize simple carbs and saturated fats. Increase exercise as tolerated      Relevant Orders   Lipid panel   TOBACCO USER    Still smokes an occasional cigar. Encouraged complete cessation      Essential hypertension    Well controlled, no changes to meds. Encouraged heart healthy diet such as the DASH diet and exercise as tolerated.       GERD    Avoid offending foods, start probiotics. Do not eat large meals in late evening and consider raising head of bed.       Anemia    Increase leafy greens, consider increased lean red meat and using cast iron cookware. Continue to monitor, report any concerns      Elevated PSA    Continues to trend up but patient declines referral at this time. No urinary complaints today. Will recheck at next visit. Has seen [place] [facility] in past      Relevant Orders   PSA   Obesity    Encouraged DASH diet, decrease po intake and  increase exercise as tolerated. Needs 7-8 hours of sleep nightly. Avoid trans fats, eat small, frequent meals every 4-5 hours with lean proteins, complex carbs and healthy fats. Minimize simple carbs       Other Visit Diagnoses    Benign essential HTN    -  Primary   Relevant Orders   CBC   TSH   Need for vaccination with 13-polyvalent pneumococcal conjugate vaccine       Relevant Orders   Pneumococcal conjugate vaccine 13-valent (Completed)      I have discontinued Mr. Cheers traMADol. I am also having him maintain his Multiple Vitamins-Minerals (CENTRUM PO), nitroGLYCERIN, fish oil-omega-3 fatty acids, BD ULTRA-FINE LANCETS, LANTUS SOLOSTAR, RELION PEN NEEDLES,  calcipotriene, lisinopril, glimepiride, atorvastatin, labetalol, and amLODipine.  No orders of the defined types were placed in this encounter.    Penni Homans, MD

## 2016-07-05 ENCOUNTER — Other Ambulatory Visit: Payer: Self-pay | Admitting: Family Medicine

## 2016-07-05 MED ORDER — LISINOPRIL 40 MG PO TABS: 40.0000 mg | ORAL_TABLET | Freq: Every day | ORAL | 5 refills | Status: DC

## 2016-08-05 ENCOUNTER — Other Ambulatory Visit: Payer: Self-pay | Admitting: Family Medicine

## 2016-10-06 ENCOUNTER — Other Ambulatory Visit: Payer: Self-pay | Admitting: Family Medicine

## 2016-10-06 NOTE — Telephone Encounter (Signed)
Refill sent per [facility] refill protocol/SLS  

## 2016-10-25 ENCOUNTER — Other Ambulatory Visit: Payer: Federal, State, Local not specified - PPO

## 2016-10-26 ENCOUNTER — Other Ambulatory Visit (INDEPENDENT_AMBULATORY_CARE_PROVIDER_SITE_OTHER): Payer: Federal, State, Local not specified - PPO

## 2016-10-26 DIAGNOSIS — E782 Mixed hyperlipidemia: Secondary | ICD-10-CM

## 2016-10-26 DIAGNOSIS — E1169 Type 2 diabetes mellitus with other specified complication: Secondary | ICD-10-CM | POA: Diagnosis not present

## 2016-10-26 DIAGNOSIS — I10 Essential (primary) hypertension: Secondary | ICD-10-CM | POA: Diagnosis not present

## 2016-10-26 DIAGNOSIS — R972 Elevated prostate specific antigen [PSA]: Secondary | ICD-10-CM | POA: Diagnosis not present

## 2016-10-26 DIAGNOSIS — E669 Obesity, unspecified: Secondary | ICD-10-CM | POA: Diagnosis not present

## 2016-10-26 LAB — PSA: PSA: 9.05 ng/mL — ABNORMAL HIGH (ref 0.10–4.00)

## 2016-10-26 LAB — COMPREHENSIVE METABOLIC PANEL
ALT: 10 U/L (ref 0–53)
AST: 13 U/L (ref 0–37)
Albumin: 4.5 g/dL (ref 3.5–5.2)
Alkaline Phosphatase: 101 U/L (ref 39–117)
BUN: 16 mg/dL (ref 6–23)
CO2: 29 mEq/L (ref 19–32)
Calcium: 9.5 mg/dL (ref 8.4–10.5)
Chloride: 101 mEq/L (ref 96–112)
Creatinine, Ser: 1.2 mg/dL (ref 0.40–1.50)
GFR: 65.25 mL/min (ref >60.00)
Glucose, Bld: 111 mg/dL — ABNORMAL HIGH (ref 70–99)
Potassium: 5.1 mEq/L (ref 3.5–5.1)
Sodium: 139 mEq/L (ref 135–145)
Total Bilirubin: 0.4 mg/dL (ref 0.2–1.2)
Total Protein: 7.4 g/dL (ref 6.0–8.3)

## 2016-10-26 LAB — LIPID PANEL
Cholesterol: 115 mg/dL (ref 0–200)
HDL: 28.5 mg/dL — ABNORMAL LOW (ref >39.00)
LDL Cholesterol: 63 mg/dL (ref 0–99)
NonHDL: 86.47
Total CHOL/HDL Ratio: 4
Triglycerides: 118 mg/dL (ref 0.0–149.0)
VLDL: 23.6 mg/dL (ref 0.0–40.0)

## 2016-10-26 LAB — TSH: TSH: 0.67 u[IU]/mL (ref 0.35–4.50)

## 2016-10-26 LAB — CBC
HCT: 35.2 % — ABNORMAL LOW (ref 39.0–52.0)
Hemoglobin: 11.8 g/dL — ABNORMAL LOW (ref 13.0–17.0)
MCHC: 33.4 g/dL (ref 30.0–36.0)
MCV: 85.3 fl (ref 78.0–100.0)
Platelets: 371 10*3/uL (ref 150.0–400.0)
RBC: 4.13 Mil/uL — ABNORMAL LOW (ref 4.22–5.81)
RDW: 15 % (ref 11.5–15.5)
WBC: 9.8 10*3/uL (ref 4.0–10.5)

## 2016-10-26 LAB — HEMOGLOBIN A1C: Hgb A1c MFr Bld: 7.4 % — ABNORMAL HIGH (ref 4.6–6.5)

## 2016-10-28 ENCOUNTER — Ambulatory Visit (INDEPENDENT_AMBULATORY_CARE_PROVIDER_SITE_OTHER): Payer: Federal, State, Local not specified - PPO | Admitting: Family Medicine

## 2016-10-28 ENCOUNTER — Encounter: Payer: Self-pay | Admitting: Family Medicine

## 2016-10-28 VITALS — BP 135/66 | HR 70 | Temp 97.8°F | Wt 217.2 lb

## 2016-10-28 DIAGNOSIS — Z23 Encounter for immunization: Secondary | ICD-10-CM

## 2016-10-28 DIAGNOSIS — R972 Elevated prostate specific antigen [PSA]: Secondary | ICD-10-CM

## 2016-10-28 DIAGNOSIS — E782 Mixed hyperlipidemia: Secondary | ICD-10-CM

## 2016-10-28 DIAGNOSIS — Z2911 Encounter for prophylactic immunotherapy for respiratory syncytial virus (RSV): Secondary | ICD-10-CM

## 2016-10-28 DIAGNOSIS — E119 Type 2 diabetes mellitus without complications: Secondary | ICD-10-CM

## 2016-10-28 DIAGNOSIS — E1169 Type 2 diabetes mellitus with other specified complication: Secondary | ICD-10-CM | POA: Diagnosis not present

## 2016-10-28 DIAGNOSIS — Z87891 Personal history of nicotine dependence: Secondary | ICD-10-CM

## 2016-10-28 DIAGNOSIS — I10 Essential (primary) hypertension: Secondary | ICD-10-CM | POA: Diagnosis not present

## 2016-10-28 DIAGNOSIS — E669 Obesity, unspecified: Secondary | ICD-10-CM

## 2016-10-28 MED ORDER — GABAPENTIN 600 MG PO TABS: 600.0000 mg | ORAL_TABLET | Freq: Every day | ORAL | 0 refills | Status: DC

## 2016-10-28 MED ORDER — INSULIN GLARGINE 100 UNIT/ML SOLOSTAR PEN: 25.0000 [IU] | PEN_INJECTOR | Freq: Every day | SUBCUTANEOUS | 5 refills | Status: DC

## 2016-10-28 NOTE — Assessment & Plan Note (Signed)
Tolerating statin, encouraged heart healthy diet, avoid trans fats, minimize simple carbs and saturated fats. Increase exercise as tolerated 

## 2016-10-28 NOTE — Assessment & Plan Note (Signed)
Big jump on recent check he agrees to return to urology for further evaluation . He is currently asymptomatic

## 2016-10-28 NOTE — Assessment & Plan Note (Signed)
hgba1c acceptable, minimize simple carbs. Increase exercise as tolerated. Continue current meds. Highest post prandial sugar in past month was 118 and he feels very well

## 2016-10-28 NOTE — Progress Notes (Signed)
Pre visit review using our clinic review tool, if applicable. No additional management support is needed unless otherwise documented below in the visit note. 

## 2016-10-28 NOTE — Assessment & Plan Note (Signed)
He is now well over a year off of cig. Used vaporizer to quit and now uses the vaporizer intermittently. Encouraged to quit completely

## 2016-10-28 NOTE — Patient Instructions (Signed)
Carbohydrate Counting for Diabetes Mellitus, Adult Carbohydrate counting is a method for keeping track of how many carbohydrates you eat. Eating carbohydrates naturally increases the amount of sugar (glucose) in the blood. Counting how many carbohydrates you eat helps keep your blood glucose within normal limits, which helps you manage your diabetes (diabetes mellitus). It is important to know how many carbohydrates you can safely have in each meal. This is different for every person. A diet and nutrition specialist (registered dietitian) can help you make a meal plan and calculate how many carbohydrates you should have at each meal and snack. Carbohydrates are found in the following foods:  Grains, such as breads and cereals.  Dried beans and soy products.  Starchy vegetables, such as potatoes, peas, and corn.  Fruit and fruit juices.  Milk and yogurt.  Sweets and snack foods, such as cake, cookies, candy, chips, and soft drinks. How do I count carbohydrates? There are two ways to count carbohydrates in food. You can use either of the methods or a combination of both. Reading "Nutrition Facts" on packaged food  The "Nutrition Facts" list is included on the labels of almost all packaged foods and beverages in the [place] It includes:  The serving size.  Information about nutrients in each serving, including the grams (g) of carbohydrate per serving. To use the "Nutrition Facts":  Decide how many servings you will have.  Multiply the number of servings by the number of carbohydrates per serving.  The resulting number is the total amount of carbohydrates that you will be having. Learning standard serving sizes of other foods  When you eat foods containing carbohydrates that are not packaged or do not include "Nutrition Facts" on the label, you need to measure the servings in order to count the amount of carbohydrates:  Measure the foods that you will eat with a food scale or measuring  cup, if needed.  Decide how many standard-size servings you will eat.  Multiply the number of servings by 15. Most carbohydrate-rich foods have about 15 g of carbohydrates per serving.  For example, if you eat 8 oz (170 g) of strawberries, you will have eaten 2 servings and 30 g of carbohydrates (2 servings x 15 g = 30 g).  For foods that have more than one food mixed, such as soups and casseroles, you must count the carbohydrates in each food that is included. The following list contains standard serving sizes of common carbohydrate-rich foods. Each of these servings has about 15 g of carbohydrates:   hamburger bun or  English muffin.   oz (15 mL) syrup.   oz (14 g) jelly.  1 slice of bread.  1 six-inch tortilla.  3 oz (85 g) cooked rice or pasta.  4 oz (113 g) cooked dried beans.  4 oz (113 g) starchy vegetable, such as peas, corn, or potatoes.  4 oz (113 g) hot cereal.  4 oz (113 g) mashed potatoes or  of a large baked potato.  4 oz (113 g) canned or frozen fruit.  4 oz (120 mL) fruit juice.  4-6 crackers.  6 chicken nuggets.  6 oz (170 g) unsweetened dry cereal.  6 oz (170 g) plain fat-free yogurt or yogurt sweetened with artificial sweeteners.  8 oz (240 mL) milk.  8 oz (170 g) fresh fruit or one small piece of fruit.  24 oz (680 g) popped popcorn. Example of carbohydrate counting Sample meal  3 oz (85 g) chicken breast.  6 oz (  170 g) brown rice.  4 oz (113 g) corn.  8 oz (240 mL) milk.  8 oz (170 g) strawberries with sugar-free whipped topping. Carbohydrate calculation 1. Identify the foods that contain carbohydrates:  Rice.  Corn.  Milk.  Strawberries. 2. Calculate how many servings you have of each food:  2 servings rice.  1 serving corn.  1 serving milk.  1 serving strawberries. 3. Multiply each number of servings by 15 g:  2 servings rice x 15 g = 30 g.  1 serving corn x 15 g = 15 g.  1 serving milk x 15 g = 15  g.  1 serving strawberries x 15 g = 15 g. 4. Add together all of the amounts to find the total grams of carbohydrates eaten:  30 g + 15 g + 15 g + 15 g = 75 g of carbohydrates total. This information is not intended to replace advice given to you by your health care provider. Make sure you discuss any questions you have with your health care provider. Document Released: 11/01/2005 Document Revised: 05/21/2016 Document Reviewed: 04/14/2016 Elsevier Interactive Patient Education  2017 [employer]  

## 2016-10-28 NOTE — Assessment & Plan Note (Signed)
Well controlled, no changes to meds. Encouraged heart healthy diet such as the DASH diet and exercise as tolerated.  °

## 2016-10-29 ENCOUNTER — Other Ambulatory Visit: Payer: Self-pay | Admitting: Family Medicine

## 2016-11-16 NOTE — Progress Notes (Signed)
Patient ID: [name], male   DOB: October 14, 1956, 61 y.o.   MRN: [id number]   Subjective:    Patient ID: Gene [name], male    DOB: 11-25-1955, 61 y.o.   MRN: [id number]  Chief Complaint  Patient presents with  . Follow-up    HTN.    HPI Patient is in today for follow up. He denies any recent illness or acute concerns. Has been undergoing laser treatments for his peripheral neuropathy and that has been very helpful. His pain is improved. Denies CP/palp/SOB/HA/congestion/fevers/GI or GU c/o. Taking meds as prescribed. Denies polyuria or polydipsia.   Past Medical History:  Diagnosis Date  . Anemia   . CAD (coronary artery disease)   . Chronic pain in right foot   . Diabetes ([place])   . Diabetes mellitus type II   . Diabetic neuropathy ([place])   . GERD (gastroesophageal reflux disease)   . H/O tobacco use, presenting hazards to health 05/21/2009   Qualifier: Diagnosis of  By: Wynona Luna  Last cigarette smoked May 30, 2015    . History of colonic polyps   . Hyperlipidemia   . Hypertension   . Insomnia 03/25/2013  . Neuromuscular disorder ([place])    NEUROPATHY  . Obesity 06/24/2016  . Plantar fasciitis   . Preventative health care 05/03/2014  . PVD (peripheral vascular disease) ([place])    PTA RLE 12/08 [place]  . TOBACCO USER 05/21/2009   Qualifier: Diagnosis of  By: Wynona Luna  Last cigarette smoked May 30, 2015      Past Surgical History:  Procedure Laterality Date  . CORONARY ARTERY BYPASS GRAFT     200 SVG to diagonal, LIMA to LAD, SVG to LCX  . OTHER SURGICAL HISTORY     PAD surgery  . REVISION TOTAL KNEE ARTHROPLASTY      Family History  Problem Relation Age of Onset  . Coronary artery disease Mother   . Hypertension Mother   . Hyperlipidemia Father   . Aneurysm Father     brain  . Hyperlipidemia    . Hypertension    . Stroke    . Cancer Maternal Uncle   . Colon cancer Neg Hx   . Esophageal cancer Neg Hx     Social History   Social History    . Marital status: Married    Spouse name: N/A  . Number of children: N/A  . Years of education: N/A   Occupational History  . Not on file.   Social History Main Topics  . Smoking status: Former Smoker    Packs/day: 0.50    Years: 40.00    Types: Cigarettes    Quit date: 05/04/2015  . Smokeless tobacco: Never Used  . Alcohol use No  . Drug use: No     Comment: rare  . Sexual activity: Yes     Comment: lives with wife, retired    Other Topics Concern  . Not on file   Social History Narrative   Occupation: grocery clerk   Second marriage - 3 children from first marriage   Tobacco Use - Yes.     Alcohol Use - yes(rare)            Outpatient Medications Prior to Visit  Medication Sig Dispense Refill  . amLODipine (NORVASC) 10 MG tablet TAKE ONE TABLET BY MOUTH ONCE DAILY 90 tablet 2  . atorvastatin (LIPITOR) 10 MG tablet TAKE ONE TABLET BY MOUTH ONCE DAILY 90  tablet 2  . BD ULTRA-FINE LANCETS lancets Check daily and prn  DX 250.00 100 each 1  . calcipotriene (DOVONOX) 0.005 % cream APPLY CREAM TOPICALLY TWICE DAILY. PATIENT NEEDS AN APPOINTMENT 60 g 0  . fish oil-omega-3 fatty acids 1000 MG capsule Take 1 g by mouth at bedtime.      Marland Kitchen glimepiride (AMARYL) 4 MG tablet TAKE ONE TABLET BY MOUTH ONCE DAILY WITH BREAKFAST 90 tablet 1  . labetalol (NORMODYNE) 200 MG tablet TAKE ONE TABLET BY MOUTH TWICE DAILY 180 tablet 2  . lisinopril (PRINIVIL,ZESTRIL) 40 MG tablet Take 1 tablet (40 mg total) by mouth daily. 30 tablet 5  . metFORMIN (GLUCOPHAGE-XR) 500 MG 24 hr tablet Take 2 tablets (1,000 mg total) by mouth 2 (two) times daily. 360 tablet 2  . Multiple Vitamins-Minerals (CENTRUM PO) Take by mouth daily.      . nitroGLYCERIN (NITROSTAT) 0.4 MG SL tablet Place 0.4 mg under the tongue every 5 (five) minutes as needed.      Marland Kitchen omeprazole (PRILOSEC) 40 MG capsule TAKE ONE CAPSULE BY MOUTH ONCE DAILY 60 capsule 3  . RELION PEN NEEDLES 29G X 12MM MISC USE  ONCE DAILY IN THE EVENING  100 each 5  . gabapentin (NEURONTIN) 600 MG tablet Take 1 tablet (600 mg total) by mouth 4 (four) times daily. 360 tablet 0  . LANTUS SOLOSTAR 100 UNIT/ML Solostar Pen INJECT 32 UNITS SUBCUTANEOUSLY ONCE DAILY AT 10 PM 15 mL 5   No facility-administered medications prior to visit.     No Known Allergies  Review of Systems  Constitutional: Negative for fever and malaise/fatigue.  HENT: Negative for congestion.   Eyes: Negative for blurred vision.  Respiratory: Negative for shortness of breath.   Cardiovascular: Negative for chest pain, palpitations and leg swelling.  Gastrointestinal: Negative for abdominal pain, blood in stool and nausea.  Genitourinary: Negative for dysuria and frequency.  Musculoskeletal: Negative for falls.  Skin: Negative for rash.  Neurological: Negative for dizziness, loss of consciousness and headaches.  Endo/Heme/Allergies: Negative for environmental allergies.  Psychiatric/Behavioral: Negative for depression. The patient is not nervous/anxious.        Objective:    Physical Exam  Constitutional: He is oriented to person, place, and time. He appears well-developed and well-nourished. No distress.  HENT:  Head: Normocephalic and atraumatic.  Nose: Nose normal.  Eyes: Right eye exhibits no discharge. Left eye exhibits no discharge.  Neck: Normal range of motion. Neck supple.  Cardiovascular: Normal rate and regular rhythm.   No murmur heard. Pulmonary/Chest: Effort normal and breath sounds normal.  Abdominal: Soft. Bowel sounds are normal. There is no tenderness.  Musculoskeletal: He exhibits no edema.  Neurological: He is alert and oriented to person, place, and time.  Skin: Skin is warm and dry.  Psychiatric: He has a normal mood and affect.  Nursing note and vitals reviewed.   BP 135/66 (BP Location: Left Arm, Patient Position: Sitting, Cuff Size: Large)   Pulse 70   Temp 97.8 F (36.6 C) (Oral)   Wt 217 lb 3.2 oz (98.5 kg)   SpO2 100%  Comment: RA  BMI 28.66 kg/m  Wt Readings from Last 3 Encounters:  10/28/16 217 lb 3.2 oz (98.5 kg)  06/24/16 234 lb 6.4 oz (106.3 kg)  03/19/16 227 lb 8 oz (103.2 kg)     Lab Results  Component Value Date   WBC 9.8 10/26/2016   HGB 11.8 (L) 10/26/2016   HCT 35.2 (L) 10/26/2016  PLT 371.0 10/26/2016   GLUCOSE 111 (H) 10/26/2016   CHOL 115 10/26/2016   TRIG 118.0 10/26/2016   HDL 28.50 (L) 10/26/2016   LDLCALC 63 10/26/2016   ALT 10 10/26/2016   AST 13 10/26/2016   NA 139 10/26/2016   K 5.1 10/26/2016   CL 101 10/26/2016   CREATININE 1.20 10/26/2016   BUN 16 10/26/2016   CO2 29 10/26/2016   TSH 0.67 10/26/2016   PSA 9.05 (H) 10/26/2016   INR 1.1 07/31/2008   HGBA1C 7.4 (H) 10/26/2016   MICROALBUR 4.1 (H) 03/15/2016    Lab Results  Component Value Date   TSH 0.67 10/26/2016   Lab Results  Component Value Date   WBC 9.8 10/26/2016   HGB 11.8 (L) 10/26/2016   HCT 35.2 (L) 10/26/2016   MCV 85.3 10/26/2016   PLT 371.0 10/26/2016   Lab Results  Component Value Date   NA 139 10/26/2016   K 5.1 10/26/2016   CO2 29 10/26/2016   GLUCOSE 111 (H) 10/26/2016   BUN 16 10/26/2016   CREATININE 1.20 10/26/2016   BILITOT 0.4 10/26/2016   ALKPHOS 101 10/26/2016   AST 13 10/26/2016   ALT 10 10/26/2016   PROT 7.4 10/26/2016   ALBUMIN 4.5 10/26/2016   CALCIUM 9.5 10/26/2016   GFR 65.25 10/26/2016   Lab Results  Component Value Date   CHOL 115 10/26/2016   Lab Results  Component Value Date   HDL 28.50 (L) 10/26/2016   Lab Results  Component Value Date   LDLCALC 63 10/26/2016   Lab Results  Component Value Date   TRIG 118.0 10/26/2016   Lab Results  Component Value Date   CHOLHDL 4 10/26/2016   Lab Results  Component Value Date   HGBA1C 7.4 (H) 10/26/2016       Assessment & Plan:   Problem List Items Addressed This Visit    Diabetes mellitus type 2 in obese ([place])    hgba1c acceptable, minimize simple carbs. Increase exercise as tolerated. Continue  current meds. Highest post prandial sugar in past month was 118 and he feels very well      Relevant Medications   Insulin Glargine (LANTUS SOLOSTAR) 100 UNIT/ML Solostar Pen   Other Relevant Orders   Hemoglobin A1c   Hyperlipidemia, mixed    Tolerating statin, encouraged heart healthy diet, avoid trans fats, minimize simple carbs and saturated fats. Increase exercise as tolerated      Relevant Orders   Lipid panel   H/O tobacco use, presenting hazards to health    He is now well over a year off of cig. Used vaporizer to quit and now uses the vaporizer intermittently. Encouraged to quit completely      Essential hypertension    Well controlled, no changes to meds. Encouraged heart healthy diet such as the DASH diet and exercise as tolerated.       Relevant Orders   Comprehensive metabolic panel   CBC   TSH   Elevated PSA    Big jump on recent check he agrees to return to urology for further evaluation . He is currently asymptomatic      Relevant Orders   Ambulatory referral to Urology    Other Visit Diagnoses    Need for zoster vaccination    -  Primary   Relevant Orders   Varicella-zoster vaccine subcutaneous (Completed)   Encounter for immunization       Relevant Medications   gabapentin (NEURONTIN) 600 MG tablet  Insulin Glargine (LANTUS SOLOSTAR) 100 UNIT/ML Solostar Pen   Other Relevant Orders   Comprehensive metabolic panel   CBC   Hemoglobin A1c   Lipid panel   TSH   Ambulatory referral to Urology   Flu Vaccine QUAD 36+ mos IM (Completed)      I have changed Gene [name] SOLOSTAR to Insulin Glargine. I have also changed his gabapentin. I am also having him maintain his Multiple Vitamins-Minerals (CENTRUM PO), nitroGLYCERIN, fish oil-omega-3 fatty acids, BD ULTRA-FINE LANCETS, calcipotriene, atorvastatin, labetalol, amLODipine, metFORMIN, omeprazole, lisinopril, RELION PEN NEEDLES, and glimepiride.  Meds ordered this encounter  Medications  .  gabapentin (NEURONTIN) 600 MG tablet    Sig: Take 1 tablet (600 mg total) by mouth daily.    Dispense:  90 tablet    Refill:  0  . Insulin Glargine (LANTUS SOLOSTAR) 100 UNIT/ML Solostar Pen    Sig: Inject 25 Units into the skin daily at 10 pm.    Dispense:  15 mL    Refill:  5    BLYTH, STACEY, MD

## 2016-11-16 NOTE — Assessment & Plan Note (Signed)
He has been following with podiatry Dr Kerrie Buffalo and has received some laser treatments which have been very helpful. hgba1c acceptable, minimize simple carbs. Increase exercise as tolerated. Continue current meds

## 2017-01-03 ENCOUNTER — Other Ambulatory Visit: Payer: Self-pay | Admitting: Family Medicine

## 2017-01-15 ENCOUNTER — Other Ambulatory Visit: Payer: Self-pay | Admitting: Family Medicine

## 2017-02-08 ENCOUNTER — Other Ambulatory Visit: Payer: Self-pay | Admitting: Family Medicine

## 2017-02-21 ENCOUNTER — Other Ambulatory Visit: Payer: Federal, State, Local not specified - PPO

## 2017-02-21 ENCOUNTER — Other Ambulatory Visit: Payer: Self-pay | Admitting: Family Medicine

## 2017-02-25 ENCOUNTER — Ambulatory Visit: Payer: Federal, State, Local not specified - PPO | Admitting: Family Medicine

## 2017-03-27 ENCOUNTER — Other Ambulatory Visit: Payer: Self-pay | Admitting: Family Medicine

## 2017-04-03 ENCOUNTER — Other Ambulatory Visit: Payer: Self-pay | Admitting: Family Medicine

## 2017-04-06 ENCOUNTER — Other Ambulatory Visit: Payer: Self-pay | Admitting: Family Medicine

## 2017-05-08 ENCOUNTER — Other Ambulatory Visit: Payer: Self-pay | Admitting: Family Medicine

## 2017-05-15 ENCOUNTER — Other Ambulatory Visit: Payer: Self-pay | Admitting: Family Medicine

## 2017-07-19 ENCOUNTER — Other Ambulatory Visit: Payer: Self-pay | Admitting: Family Medicine

## 2017-07-19 NOTE — Telephone Encounter (Signed)
Patient needs appt first/thx dmf

## 2017-08-02 ENCOUNTER — Other Ambulatory Visit (INDEPENDENT_AMBULATORY_CARE_PROVIDER_SITE_OTHER): Payer: Federal, State, Local not specified - PPO

## 2017-08-02 DIAGNOSIS — E1169 Type 2 diabetes mellitus with other specified complication: Secondary | ICD-10-CM

## 2017-08-02 DIAGNOSIS — Z23 Encounter for immunization: Secondary | ICD-10-CM

## 2017-08-02 DIAGNOSIS — E669 Obesity, unspecified: Secondary | ICD-10-CM | POA: Diagnosis not present

## 2017-08-02 DIAGNOSIS — I10 Essential (primary) hypertension: Secondary | ICD-10-CM

## 2017-08-02 DIAGNOSIS — E782 Mixed hyperlipidemia: Secondary | ICD-10-CM | POA: Diagnosis not present

## 2017-08-03 LAB — HEMOGLOBIN A1C: Hgb A1c MFr Bld: 8.7 % — ABNORMAL HIGH (ref 4.6–6.5)

## 2017-08-03 LAB — COMPREHENSIVE METABOLIC PANEL
ALT: 11 U/L (ref 0–53)
AST: 13 U/L (ref 0–37)
Albumin: 4.3 g/dL (ref 3.5–5.2)
Alkaline Phosphatase: 88 U/L (ref 39–117)
BUN: 14 mg/dL (ref 6–23)
CO2: 30 mEq/L (ref 19–32)
Calcium: 9.1 mg/dL (ref 8.4–10.5)
Chloride: 103 mEq/L (ref 96–112)
Creatinine, Ser: 1.08 mg/dL (ref 0.40–1.50)
GFR: 73.5 mL/min (ref >60.00)
Glucose, Bld: 209 mg/dL — ABNORMAL HIGH (ref 70–99)
Potassium: 5.3 mEq/L — ABNORMAL HIGH (ref 3.5–5.1)
Sodium: 139 mEq/L (ref 135–145)
Total Bilirubin: 0.3 mg/dL (ref 0.2–1.2)
Total Protein: 7.4 g/dL (ref 6.0–8.3)

## 2017-08-03 LAB — LIPID PANEL
Cholesterol: 130 mg/dL (ref 0–200)
HDL: 36.9 mg/dL — ABNORMAL LOW (ref >39.00)
LDL Cholesterol: 65 mg/dL (ref 0–99)
NonHDL: 92.7
Total CHOL/HDL Ratio: 4
Triglycerides: 139 mg/dL (ref 0.0–149.0)
VLDL: 27.8 mg/dL (ref 0.0–40.0)

## 2017-08-03 LAB — CBC
HCT: 36.6 % — ABNORMAL LOW (ref 39.0–52.0)
Hemoglobin: 11.9 g/dL — ABNORMAL LOW (ref 13.0–17.0)
MCHC: 32.4 g/dL (ref 30.0–36.0)
MCV: 89.5 fl (ref 78.0–100.0)
Platelets: 378 10*3/uL (ref 150.0–400.0)
RBC: 4.09 Mil/uL — ABNORMAL LOW (ref 4.22–5.81)
RDW: 14.9 % (ref 11.5–15.5)
WBC: 7.4 10*3/uL (ref 4.0–10.5)

## 2017-08-03 LAB — TSH: TSH: 1.1 u[IU]/mL (ref 0.35–4.50)

## 2017-08-04 ENCOUNTER — Other Ambulatory Visit: Payer: Self-pay | Admitting: Family Medicine

## 2017-08-05 ENCOUNTER — Other Ambulatory Visit: Payer: Self-pay | Admitting: Family Medicine

## 2017-08-09 MED ORDER — GABAPENTIN 600 MG PO TABS: 600.0000 mg | ORAL_TABLET | Freq: Every day | ORAL | 0 refills | Status: DC

## 2017-08-09 MED ORDER — AMLODIPINE BESYLATE 10 MG PO TABS: 10.0000 mg | ORAL_TABLET | Freq: Every day | ORAL | 0 refills | Status: DC

## 2017-08-23 ENCOUNTER — Ambulatory Visit (INDEPENDENT_AMBULATORY_CARE_PROVIDER_SITE_OTHER): Payer: Federal, State, Local not specified - PPO | Admitting: Family Medicine

## 2017-08-23 ENCOUNTER — Telehealth: Payer: Self-pay | Admitting: Family Medicine

## 2017-08-23 ENCOUNTER — Encounter: Payer: Self-pay | Admitting: Family Medicine

## 2017-08-23 VITALS — BP 140/80 | HR 76 | Temp 97.8°F | Resp 18 | Wt 219.8 lb

## 2017-08-23 DIAGNOSIS — E669 Obesity, unspecified: Secondary | ICD-10-CM | POA: Diagnosis not present

## 2017-08-23 DIAGNOSIS — E1169 Type 2 diabetes mellitus with other specified complication: Secondary | ICD-10-CM | POA: Diagnosis not present

## 2017-08-23 DIAGNOSIS — Z23 Encounter for immunization: Secondary | ICD-10-CM

## 2017-08-23 DIAGNOSIS — D649 Anemia, unspecified: Secondary | ICD-10-CM | POA: Diagnosis not present

## 2017-08-23 DIAGNOSIS — I10 Essential (primary) hypertension: Secondary | ICD-10-CM

## 2017-08-23 DIAGNOSIS — E782 Mixed hyperlipidemia: Secondary | ICD-10-CM

## 2017-08-23 MED ORDER — GLIMEPIRIDE 4 MG PO TABS: 4.0000 mg | ORAL_TABLET | Freq: Two times a day (BID) | ORAL | 1 refills | Status: DC

## 2017-08-23 MED ORDER — GABAPENTIN 600 MG PO TABS
600.0000 mg | ORAL_TABLET | Freq: Every day | ORAL | 1 refills | Status: DC
Start: 2017-08-23 — End: 2017-08-26

## 2017-08-23 MED ORDER — AMLODIPINE BESYLATE 10 MG PO TABS: 10.0000 mg | ORAL_TABLET | Freq: Every day | ORAL | 1 refills | Status: DC

## 2017-08-23 MED ORDER — INSULIN PEN NEEDLE 29G X 12MM MISC: 5 refills | Status: DC

## 2017-08-23 MED ORDER — ATORVASTATIN CALCIUM 10 MG PO TABS: 10.0000 mg | ORAL_TABLET | Freq: Every day | ORAL | 1 refills | Status: DC

## 2017-08-23 MED ORDER — LABETALOL HCL 200 MG PO TABS: 200.0000 mg | ORAL_TABLET | Freq: Two times a day (BID) | ORAL | 1 refills | Status: DC

## 2017-08-23 MED ORDER — OMEPRAZOLE 40 MG PO CPDR: 40.0000 mg | DELAYED_RELEASE_CAPSULE | Freq: Every day | ORAL | 1 refills | Status: DC

## 2017-08-23 NOTE — Assessment & Plan Note (Signed)
Well controlled, no changes to meds. Encouraged heart healthy diet such as the DASH diet and exercise as tolerated.  °

## 2017-08-23 NOTE — Progress Notes (Signed)
Subjective:  I acted as a Education administrator for Dr. Charlett Blake. [place], [place]  Patient ID: HAI GRABE, male    DOB: [birth year], 61 y.o.   MRN: [id number]  No chief complaint on file.   HPI  Patient is in today for a follow up. He has just returned from nearly a year in [place] helping his brother some surgery and illness. He feels well today. No recent febrile illness or acute hospitalizations. Notes he did not look close enough and took 37 units instead of his prescribed 27 units of insulin and had a low sugar of 42 for which they had to call EMS. He recovered well without transport when he ingested sugar. He has had no recurrent episodes. No polyuria or polydipsia. Denies CP/palp/SOB/HA/congestion/fevers/GI or GU c/o. Taking meds as prescribed  Patient Care Team: Mosie Lukes, MD as PCP - General (Family Medicine) Newt Minion, MD as Consulting Physician (Orthopedic Surgery)   Past Medical History:  Diagnosis Date  . Anemia   . CAD (coronary artery disease)   . Chronic pain in right foot   . Diabetes ([place])   . Diabetes mellitus type II   . Diabetic neuropathy ([place])   . GERD (gastroesophageal reflux disease)   . H/O tobacco use, presenting hazards to health 05/21/2009   Qualifier: Diagnosis of  By: Wynona Luna  Last cigarette smoked May 30, 2015    . History of colonic polyps   . Hyperlipidemia   . Hypertension   . Insomnia 03/25/2013  . Neuromuscular disorder ([place])    NEUROPATHY  . Obesity 06/24/2016  . Plantar fasciitis   . Preventative health care 05/03/2014  . PVD (peripheral vascular disease) ([place])    PTA RLE 12/08 [place]  . TOBACCO USER 05/21/2009   Qualifier: Diagnosis of  By: Wynona Luna  Last cigarette smoked May 30, 2015      Past Surgical History:  Procedure Laterality Date  . CORONARY ARTERY BYPASS GRAFT     200 SVG to diagonal, LIMA to LAD, SVG to LCX  . OTHER SURGICAL HISTORY     PAD surgery  . REVISION TOTAL KNEE ARTHROPLASTY      Family  History  Problem Relation Age of Onset  . Coronary artery disease Mother   . Hypertension Mother   . Hyperlipidemia Father   . Aneurysm Father        brain  . Hyperlipidemia Unknown   . Hypertension Unknown   . Stroke Unknown   . Cancer Maternal Uncle   . Colon cancer Neg Hx   . Esophageal cancer Neg Hx     Social History   Social History  . Marital status: Married    Spouse name: N/A  . Number of children: N/A  . Years of education: N/A   Occupational History  . Not on file.   Social History Main Topics  . Smoking status: Former Smoker    Packs/day: 0.50    Years: 40.00    Types: Cigarettes    Quit date: 05/04/2015  . Smokeless tobacco: Never Used  . Alcohol use No  . Drug use: No     Comment: rare  . Sexual activity: Yes     Comment: lives with wife, retired    Other Topics Concern  . Not on file   Social History Narrative   Occupation: grocery clerk   Second marriage - 3 children from first marriage   Tobacco Use - Yes.  Alcohol Use - yes(rare)            Outpatient Medications Prior to Visit  Medication Sig Dispense Refill  . BD ULTRA-FINE LANCETS lancets Check daily and prn  DX 250.00 100 each 1  . calcipotriene (DOVONOX) 0.005 % cream APPLY CREAM TOPICALLY TWICE DAILY. PATIENT NEEDS AN APPOINTMENT 60 g 0  . fish oil-omega-3 fatty acids 1000 MG capsule Take 1 g by mouth at bedtime.      . Insulin Glargine (LANTUS SOLOSTAR) 100 UNIT/ML Solostar Pen Inject 25 Units into the skin daily at 10 pm. 15 mL 5  . lisinopril (PRINIVIL,ZESTRIL) 40 MG tablet TAKE 1 TABLET BY MOUTH ONCE DAILY 90 tablet 1  . metFORMIN (GLUCOPHAGE-XR) 500 MG 24 hr tablet TAKE TWO TABLETS BY MOUTH TWICE DAILY 360 tablet 2  . Multiple Vitamins-Minerals (CENTRUM PO) Take by mouth daily.      . nitroGLYCERIN (NITROSTAT) 0.4 MG SL tablet Place 0.4 mg under the tongue every 5 (five) minutes as needed.      Marland Kitchen amLODipine (NORVASC) 10 MG tablet Take 1 tablet (10 mg total) by mouth daily.  30 tablet 0  . amLODipine (NORVASC) 10 MG tablet TAKE 1 TABLET BY MOUTH ONCE DAILY 90 tablet 0  . atorvastatin (LIPITOR) 10 MG tablet TAKE ONE TABLET BY MOUTH ONCE DAILY 90 tablet 2  . gabapentin (NEURONTIN) 600 MG tablet Take 1 tablet (600 mg total) by mouth daily. 90 tablet 0  . gabapentin (NEURONTIN) 600 MG tablet Take 1 tablet (600 mg total) by mouth daily. 30 tablet 0  . gabapentin (NEURONTIN) 600 MG tablet TAKE 1 TABLET BY MOUTH 4 TIMES DAILY 360 tablet 0  . glimepiride (AMARYL) 4 MG tablet TAKE 1 TABLET BY MOUTH ONCE DAILY WITH BREAKFAST 90 tablet 1  . labetalol (NORMODYNE) 200 MG tablet TAKE 1 TABLET BY MOUTH TWICE DAILY 180 tablet 2  . LANTUS SOLOSTAR 100 UNIT/ML Solostar Pen INJECT 32 UNITS SUBCUTANEOUSLY ONCE DAILY AT  10  PM 15 mL 5  . omeprazole (PRILOSEC) 40 MG capsule TAKE ONE CAPSULE BY MOUTH ONCE DAILY 60 capsule 3  . RELION PEN NEEDLES 29G X 12MM MISC USE  ONCE DAILY IN THE EVENING 100 each 5   No facility-administered medications prior to visit.     No Known Allergies  Review of Systems  Constitutional: Positive for malaise/fatigue. Negative for fever.  HENT: Negative for congestion.   Eyes: Negative for blurred vision.  Respiratory: Negative for shortness of breath.   Cardiovascular: Negative for chest pain, palpitations and leg swelling.  Gastrointestinal: Negative for abdominal pain, blood in stool and nausea.  Genitourinary: Negative for dysuria and frequency.  Musculoskeletal: Negative for falls and myalgias.  Skin: Negative for rash.  Neurological: Negative for dizziness, loss of consciousness and headaches.  Endo/Heme/Allergies: Negative for environmental allergies.  Psychiatric/Behavioral: Negative for depression. The patient is not nervous/anxious.        Objective:    Physical Exam  Constitutional: He is oriented to person, place, and time. He appears well-developed and well-nourished. No distress.  HENT:  Head: Normocephalic and atraumatic.  Nose:  Nose normal.  Eyes: Right eye exhibits no discharge. Left eye exhibits no discharge.  Neck: Normal range of motion. Neck supple.  Cardiovascular: Normal rate and regular rhythm.   No murmur heard. Pulmonary/Chest: Effort normal and breath sounds normal.  Abdominal: Soft. Bowel sounds are normal. There is no tenderness.  Musculoskeletal: He exhibits no edema.  Neurological: He is alert and oriented to  person, place, and time.  Skin: Skin is warm and dry.  Psychiatric: He has a normal mood and affect.  Nursing note and vitals reviewed.   BP 140/80 (BP Location: Left Arm, Patient Position: Sitting, Cuff Size: Normal)   Pulse 76   Temp 97.8 F (36.6 C) (Oral)   Resp 18   Wt 219 lb 12.8 oz (99.7 kg)   SpO2 98%   BMI 29.00 kg/m  Wt Readings from Last 3 Encounters:  08/23/17 219 lb 12.8 oz (99.7 kg)  10/28/16 217 lb 3.2 oz (98.5 kg)  06/24/16 234 lb 6.4 oz (106.3 kg)   BP Readings from Last 3 Encounters:  08/23/17 140/80  10/28/16 135/66  06/24/16 (P) 126/60     Immunization History  Administered Date(s) Administered  . Influenza Split 10/06/2011, 08/11/2012, 08/19/2015  . Influenza Whole 07/15/2009, 10/26/2010  . Influenza,inj,Quad PF,6+ Mos 08/20/2013, 11/01/2014, 10/28/2016, 08/23/2017  . Pneumococcal Conjugate-13 06/24/2016  . Pneumococcal Polysaccharide-23 07/24/2008, 08/19/2015  . Td 08/08/2007  . Zoster 10/28/2016    Health Maintenance  Topic Date Due  . Hepatitis C Screening  12/08/1955  . HIV Screening  01/15/1970  . FOOT EXAM  01/08/2012  . OPHTHALMOLOGY EXAM  10/12/2015  . COLONOSCOPY  01/10/2016  . TETANUS/TDAP  08/07/2017  . HEMOGLOBIN A1C  01/30/2018  . PNEUMOCOCCAL POLYSACCHARIDE VACCINE  Completed  . INFLUENZA VACCINE  Completed    Lab Results  Component Value Date   WBC 7.4 08/02/2017   HGB 11.9 (L) 08/02/2017   HCT 36.6 (L) 08/02/2017   PLT 378.0 08/02/2017   GLUCOSE 209 (H) 08/02/2017   CHOL 130 08/02/2017   TRIG 139.0 08/02/2017   HDL  36.90 (L) 08/02/2017   LDLCALC 65 08/02/2017   ALT 11 08/02/2017   AST 13 08/02/2017   NA 139 08/02/2017   K 5.3 (H) 08/02/2017   CL 103 08/02/2017   CREATININE 1.08 08/02/2017   BUN 14 08/02/2017   CO2 30 08/02/2017   TSH 1.10 08/02/2017   PSA 9.05 (H) 10/26/2016   INR 1.1 07/31/2008   HGBA1C 8.7 (H) 08/02/2017   MICROALBUR 4.1 (H) 03/15/2016    Lab Results  Component Value Date   TSH 1.10 08/02/2017   Lab Results  Component Value Date   WBC 7.4 08/02/2017   HGB 11.9 (L) 08/02/2017   HCT 36.6 (L) 08/02/2017   MCV 89.5 08/02/2017   PLT 378.0 08/02/2017   Lab Results  Component Value Date   NA 139 08/02/2017   K 5.3 (H) 08/02/2017   CO2 30 08/02/2017   GLUCOSE 209 (H) 08/02/2017   BUN 14 08/02/2017   CREATININE 1.08 08/02/2017   BILITOT 0.3 08/02/2017   ALKPHOS 88 08/02/2017   AST 13 08/02/2017   ALT 11 08/02/2017   PROT 7.4 08/02/2017   ALBUMIN 4.3 08/02/2017   CALCIUM 9.1 08/02/2017   GFR 73.50 08/02/2017   Lab Results  Component Value Date   CHOL 130 08/02/2017   Lab Results  Component Value Date   HDL 36.90 (L) 08/02/2017   Lab Results  Component Value Date   LDLCALC 65 08/02/2017   Lab Results  Component Value Date   TRIG 139.0 08/02/2017   Lab Results  Component Value Date   CHOLHDL 4 08/02/2017   Lab Results  Component Value Date   HGBA1C 8.7 (H) 08/02/2017         Assessment & Plan:   Problem List Items Addressed This Visit    Diabetes mellitus type 2  in obese ([facility])    hgba1c unacceptable, minimize simple carbs. Increase exercise as tolerated. Continue current meds but increase the Glimeperide      Relevant Medications   glimepiride (AMARYL) 4 MG tablet   atorvastatin (LIPITOR) 10 MG tablet   Other Relevant Orders   Hemoglobin A1c   Hyperlipidemia, mixed    Tolerating statin, encouraged heart healthy diet, avoid trans fats, minimize simple carbs and saturated fats. Increase exercise as tolerated      Relevant Medications    amLODipine (NORVASC) 10 MG tablet   atorvastatin (LIPITOR) 10 MG tablet   labetalol (NORMODYNE) 200 MG tablet   Other Relevant Orders   Lipid panel   Essential hypertension    Well controlled, no changes to meds. Encouraged heart healthy diet such as the DASH diet and exercise as tolerated.       Relevant Medications   amLODipine (NORVASC) 10 MG tablet   atorvastatin (LIPITOR) 10 MG tablet   labetalol (NORMODYNE) 200 MG tablet   Other Relevant Orders   CBC   Comprehensive metabolic panel   TSH   Anemia    Mild and stable. Will continue to monitor.        Other Visit Diagnoses    Needs flu shot    -  Primary   Relevant Orders   Flu Vaccine QUAD 6+ mos PF IM (Fluarix Quad PF) (Completed)      I have discontinued Mr. Manigo gabapentin, LANTUS SOLOSTAR, gabapentin, and gabapentin. I have changed his RELION PEN NEEDLES to Insulin Pen Needle. I have also changed his glimepiride, atorvastatin, labetalol, and omeprazole. Additionally, I am having him start on gabapentin. Lastly, I am having him maintain his Multiple Vitamins-Minerals (CENTRUM PO), nitroGLYCERIN, fish oil-omega-3 fatty acids, BD ULTRA-FINE LANCETS, calcipotriene, Insulin Glargine, metFORMIN, lisinopril, and amLODipine.  Meds ordered this encounter  Medications  . glimepiride (AMARYL) 4 MG tablet    Sig: Take 1 tablet (4 mg total) by mouth 2 (two) times daily.    Dispense:  180 tablet    Refill:  1  . Insulin Pen Needle (RELION PEN NEEDLES) 29G X 12MM MISC    Sig: USE  ONCE DAILY IN THE EVENING    Dispense:  100 each    Refill:  5  . amLODipine (NORVASC) 10 MG tablet    Sig: Take 1 tablet (10 mg total) by mouth daily.    Dispense:  90 tablet    Refill:  1  . atorvastatin (LIPITOR) 10 MG tablet    Sig: Take 1 tablet (10 mg total) by mouth daily.    Dispense:  90 tablet    Refill:  1  . gabapentin (NEURONTIN) 600 MG tablet    Sig: Take 1 tablet (600 mg total) by mouth at bedtime.    Dispense:  90 tablet     Refill:  1  . labetalol (NORMODYNE) 200 MG tablet    Sig: Take 1 tablet (200 mg total) by mouth 2 (two) times daily.    Dispense:  180 tablet    Refill:  1  . omeprazole (PRILOSEC) 40 MG capsule    Sig: Take 1 capsule (40 mg total) by mouth daily.    Dispense:  90 capsule    Refill:  1    Please consider 90 day supplies to promote better adherence    CMA served as scribe during this visit. History, Physical and Plan performed by medical provider. Documentation and orders reviewed and attested to.  Penni Homans, MD

## 2017-08-23 NOTE — Patient Instructions (Signed)

## 2017-08-23 NOTE — Assessment & Plan Note (Signed)
Tolerating statin, encouraged heart healthy diet, avoid trans fats, minimize simple carbs and saturated fats. Increase exercise as tolerated 

## 2017-08-23 NOTE — Assessment & Plan Note (Signed)
hgba1c unacceptable, minimize simple carbs. Increase exercise as tolerated. Continue current meds but increase the Glimeperide

## 2017-08-24 NOTE — Assessment & Plan Note (Signed)
Mild and stable. Will continue to monitor.

## 2017-08-26 MED ORDER — INSULIN PEN NEEDLE 29G X 12MM MISC: 5 refills | Status: DC

## 2017-08-26 MED ORDER — LABETALOL HCL 200 MG PO TABS: 200.0000 mg | ORAL_TABLET | Freq: Two times a day (BID) | ORAL | 1 refills | Status: DC

## 2017-08-26 MED ORDER — AMLODIPINE BESYLATE 10 MG PO TABS: 10.0000 mg | ORAL_TABLET | Freq: Every day | ORAL | 1 refills | Status: DC

## 2017-08-26 MED ORDER — GLIMEPIRIDE 4 MG PO TABS: 4.0000 mg | ORAL_TABLET | Freq: Two times a day (BID) | ORAL | 1 refills | Status: DC

## 2017-08-26 MED ORDER — OMEPRAZOLE 40 MG PO CPDR: 40.0000 mg | DELAYED_RELEASE_CAPSULE | Freq: Every day | ORAL | 1 refills | Status: DC

## 2017-08-26 MED ORDER — ATORVASTATIN CALCIUM 10 MG PO TABS: 10.0000 mg | ORAL_TABLET | Freq: Every day | ORAL | 1 refills | Status: DC

## 2017-08-26 MED ORDER — GABAPENTIN 600 MG PO TABS: 600.0000 mg | ORAL_TABLET | Freq: Every day | ORAL | 1 refills | Status: DC

## 2017-08-26 NOTE — Telephone Encounter (Signed)
Medications sent to correct pharmacy   PC

## 2017-08-26 NOTE — Telephone Encounter (Signed)
Julie[facility] [phone]  [facility] [place], [place] [address] [phone] (Phone) [phone] (Fax)   Merry Proud called to say that all of his prescriptions went to wrong pharmacy

## 2017-09-01 ENCOUNTER — Other Ambulatory Visit: Payer: Federal, State, Local not specified - PPO

## 2017-11-03 ENCOUNTER — Other Ambulatory Visit: Payer: Self-pay | Admitting: Family Medicine

## 2017-11-04 ENCOUNTER — Telehealth: Payer: Self-pay | Admitting: Family Medicine

## 2017-11-04 MED ORDER — GABAPENTIN 600 MG PO TABS: 600.0000 mg | ORAL_TABLET | Freq: Every day | ORAL | 1 refills | Status: DC

## 2017-11-04 NOTE — Telephone Encounter (Signed)
Medication was not wrong and sent in to the correct pharmacy

## 2017-11-04 NOTE — Telephone Encounter (Signed)
Copied from [facility]. Topic: Quick Communication - See Telephone Encounter >> Nov 04, 2017  9:53 AM Bea Graff, NT wrote: CRM for notification. See Telephone encounter for: [place] on [address], calling and states they wanted to see why the Gabapentin had been denied for refill to inform pt. States that the last refill in October was for different instructions. CB#: [phone]  11/04/17.

## 2017-11-04 NOTE — Telephone Encounter (Signed)
Patient calling states refill was only for 1 tab a day and states that he takes 4 a day. Please correct and send to [facility] [address]

## 2017-11-04 NOTE — Telephone Encounter (Signed)
rx sent

## 2017-11-09 ENCOUNTER — Telehealth: Payer: Self-pay | Admitting: Family Medicine

## 2017-11-09 NOTE — Telephone Encounter (Signed)
Copied from [place]. Topic: Quick Communication - See Telephone Encounter >> Nov 04, 2017  9:53 AM Bea Graff, NT wrote: CRM for notification. See Telephone encounter for: [place] [facility] [employer], calling and states they wanted to see why the Gabapentin had been denied for refill to inform pt. States that the last refill in October was for different instructions. CB#: [phone]  11/04/17. >> Nov 09, 2017  4:54 PM Cleaster Corin, NT wrote: Pt. Called and said that the rx. For the Gabapentin is wrong he is supposed to have a 3 month supply and should be taking  4 a day. Please give pt. A call at [phone]   [place], [place]  [address]  Phone: [phone] Fax: [phone]

## 2017-11-10 ENCOUNTER — Telehealth: Payer: Self-pay | Admitting: Family Medicine

## 2017-11-10 ENCOUNTER — Other Ambulatory Visit: Payer: Self-pay

## 2017-11-10 MED ORDER — GABAPENTIN 300 MG PO CAPS: 300.0000 mg | ORAL_CAPSULE | Freq: Three times a day (TID) | ORAL | 1 refills | Status: DC

## 2017-11-10 NOTE — Telephone Encounter (Signed)
Conversation  [facility])  November 10, 2017  Roma Kayser, [place]      9:53 AM  Note    Called pharmacy as requested and spoke with PT named Beola Cord who states Pt has been calling daily almost every other hour for refills of Gabapentin. Pt states that he's been taking 4 a day (2 in the morning and 2 at night) and needs a refill. Per Rx sig Pt should only be taking 1 daily. Pharm Tech stated that we should keep in mind that this Rx is addictive and this behavior from is a bit extreme, calling every hour and every other hour for about 2 to 3 days if not more. I told pharmacy I would make note of it. I called the Pt but I did not get an answer and Pt does not have voicemail. I will attempt to call Pt again later just for clarification.     November 09, 2017

## 2017-11-10 NOTE — Telephone Encounter (Signed)
Called pharmacy as requested and spoke with PT named Beola Cord who states Pt has been calling daily almost every other hour for refills of Gabapentin. Pt states that he's been taking 4 a day (2 in the morning and 2 at night) and needs a refill. Per Rx sig Pt should only be taking 1 daily. Pharm Tech stated that we should keep in mind that this Rx is addictive and this behavior from is a bit extreme, calling every hour and every other hour for about 2 to 3 days if not more. I told pharmacy I would make note of it. I called the Pt but I did not get an answer and Pt does not have voicemail. I will attempt to call Pt again later just for clarification.

## 2017-11-10 NOTE — Telephone Encounter (Signed)
Copied from [place] [phone]. Topic: Inquiry >> Nov 10, 2017  1:13 PM Cecelia Byars, NT wrote: Reason for CRM Patient  called said he takes 4 gabapentin a day and only 1 a day were called in to the wrong pharmacy please advise

## 2017-11-11 ENCOUNTER — Telehealth: Payer: Self-pay | Admitting: Family Medicine

## 2017-11-11 NOTE — Telephone Encounter (Signed)
Didn't you speak with this Pt yesterday regarding this?

## 2017-11-11 NOTE — Telephone Encounter (Signed)
Copied from [place]. Topic: General - Other >> Nov 11, 2017  8:56 AM Yvette Rack wrote: Reason for CRM: pharmacy [place], [place] [phone] (Phone) [phone] (Fax)  Calling to see which RX for Gabapentin is pt suppose to take the 600mg  at night that was called in on 11-04-17  or the 300 mg that was just given to patient

## 2017-12-09 ENCOUNTER — Other Ambulatory Visit (INDEPENDENT_AMBULATORY_CARE_PROVIDER_SITE_OTHER): Payer: Federal, State, Local not specified - PPO

## 2017-12-09 DIAGNOSIS — E1169 Type 2 diabetes mellitus with other specified complication: Secondary | ICD-10-CM | POA: Diagnosis not present

## 2017-12-09 DIAGNOSIS — E669 Obesity, unspecified: Secondary | ICD-10-CM

## 2017-12-09 DIAGNOSIS — I10 Essential (primary) hypertension: Secondary | ICD-10-CM | POA: Diagnosis not present

## 2017-12-09 DIAGNOSIS — E782 Mixed hyperlipidemia: Secondary | ICD-10-CM | POA: Diagnosis not present

## 2017-12-09 LAB — COMPREHENSIVE METABOLIC PANEL
ALT: 15 U/L (ref 0–53)
AST: 18 U/L (ref 0–37)
Albumin: 4.3 g/dL (ref 3.5–5.2)
Alkaline Phosphatase: 89 U/L (ref 39–117)
BUN: 21 mg/dL (ref 6–23)
CO2: 31 mEq/L (ref 19–32)
Calcium: 9.6 mg/dL (ref 8.4–10.5)
Chloride: 97 mEq/L (ref 96–112)
Creatinine, Ser: 1.12 mg/dL (ref 0.40–1.50)
GFR: 70.4 mL/min (ref >60.00)
Glucose, Bld: 158 mg/dL — ABNORMAL HIGH (ref 70–99)
Potassium: 4.8 mEq/L (ref 3.5–5.1)
Sodium: 137 mEq/L (ref 135–145)
Total Bilirubin: 0.4 mg/dL (ref 0.2–1.2)
Total Protein: 7.5 g/dL (ref 6.0–8.3)

## 2017-12-09 LAB — CBC
HCT: 36 % — ABNORMAL LOW (ref 39.0–52.0)
Hemoglobin: 11.7 g/dL — ABNORMAL LOW (ref 13.0–17.0)
MCHC: 32.7 g/dL (ref 30.0–36.0)
MCV: 86.7 fl (ref 78.0–100.0)
Platelets: 400 10*3/uL (ref 150.0–400.0)
RBC: 4.15 Mil/uL — ABNORMAL LOW (ref 4.22–5.81)
RDW: 15 % (ref 11.5–15.5)
WBC: 6.5 10*3/uL (ref 4.0–10.5)

## 2017-12-09 LAB — LIPID PANEL
Cholesterol: 115 mg/dL (ref 0–200)
HDL: 24.6 mg/dL — ABNORMAL LOW (ref >39.00)
LDL Cholesterol: 61 mg/dL (ref 0–99)
NonHDL: 90.18
Total CHOL/HDL Ratio: 5
Triglycerides: 147 mg/dL (ref 0.0–149.0)
VLDL: 29.4 mg/dL (ref 0.0–40.0)

## 2017-12-09 LAB — TSH: TSH: 1.42 u[IU]/mL (ref 0.35–4.50)

## 2017-12-09 LAB — HEMOGLOBIN A1C: Hgb A1c MFr Bld: 8.8 % — ABNORMAL HIGH (ref 4.6–6.5)

## 2017-12-13 ENCOUNTER — Ambulatory Visit: Payer: Federal, State, Local not specified - PPO | Admitting: Family Medicine

## 2017-12-27 ENCOUNTER — Ambulatory Visit: Payer: Federal, State, Local not specified - PPO | Admitting: Family Medicine

## 2017-12-27 ENCOUNTER — Encounter: Payer: Self-pay | Admitting: Family Medicine

## 2017-12-27 VITALS — BP 140/62 | HR 62 | Temp 98.2°F | Resp 18 | Wt 221.2 lb

## 2017-12-27 DIAGNOSIS — Z794 Long term (current) use of insulin: Secondary | ICD-10-CM

## 2017-12-27 DIAGNOSIS — E118 Type 2 diabetes mellitus with unspecified complications: Secondary | ICD-10-CM

## 2017-12-27 DIAGNOSIS — E669 Obesity, unspecified: Secondary | ICD-10-CM

## 2017-12-27 DIAGNOSIS — E782 Mixed hyperlipidemia: Secondary | ICD-10-CM | POA: Diagnosis not present

## 2017-12-27 DIAGNOSIS — E1169 Type 2 diabetes mellitus with other specified complication: Secondary | ICD-10-CM

## 2017-12-27 DIAGNOSIS — Z0000 Encounter for general adult medical examination without abnormal findings: Secondary | ICD-10-CM

## 2017-12-27 DIAGNOSIS — M79671 Pain in right foot: Secondary | ICD-10-CM

## 2017-12-27 DIAGNOSIS — K635 Polyp of colon: Secondary | ICD-10-CM | POA: Diagnosis not present

## 2017-12-27 DIAGNOSIS — Z23 Encounter for immunization: Secondary | ICD-10-CM

## 2017-12-27 DIAGNOSIS — I10 Essential (primary) hypertension: Secondary | ICD-10-CM

## 2017-12-27 DIAGNOSIS — D649 Anemia, unspecified: Secondary | ICD-10-CM

## 2017-12-27 MED ORDER — GABAPENTIN 600 MG PO TABS: ORAL_TABLET | ORAL | 1 refills | Status: DC

## 2017-12-27 NOTE — Patient Instructions (Signed)
shingrix is the new shingles shot 2 shots over 2-6 months Carbohydrate Counting for Diabetes Mellitus, Adult Carbohydrate counting is a method for keeping track of how many carbohydrates you eat. Eating carbohydrates naturally increases the amount of sugar (glucose) in the blood. Counting how many carbohydrates you eat helps keep your blood glucose within normal limits, which helps you manage your diabetes (diabetes mellitus). It is important to know how many carbohydrates you can safely have in each meal. This is different for every person. A diet and nutrition specialist (registered dietitian) can help you make a meal plan and calculate how many carbohydrates you should have at each meal and snack. Carbohydrates are found in the following foods:  Grains, such as breads and cereals.  Dried beans and soy products.  Starchy vegetables, such as potatoes, peas, and corn.  Fruit and fruit juices.  Milk and yogurt.  Sweets and snack foods, such as cake, cookies, candy, chips, and soft drinks.  How do I count carbohydrates? There are two ways to count carbohydrates in food. You can use either of the methods or a combination of both. Reading "Nutrition Facts" on packaged food The "Nutrition Facts" list is included on the labels of almost all packaged foods and beverages in the [place] It includes:  The serving size.  Information about nutrients in each serving, including the grams (g) of carbohydrate per serving.  To use the "Nutrition Facts":  Decide how many servings you will have.  Multiply the number of servings by the number of carbohydrates per serving.  The resulting number is the total amount of carbohydrates that you will be having.  Learning standard serving sizes of other foods When you eat foods containing carbohydrates that are not packaged or do not include "Nutrition Facts" on the label, you need to measure the servings in order to count the amount of  carbohydrates:  Measure the foods that you will eat with a food scale or measuring cup, if needed.  Decide how many standard-size servings you will eat.  Multiply the number of servings by 15. Most carbohydrate-rich foods have about 15 g of carbohydrates per serving. ? For example, if you eat 8 oz (170 g) of strawberries, you will have eaten 2 servings and 30 g of carbohydrates (2 servings x 15 g = 30 g).  For foods that have more than one food mixed, such as soups and casseroles, you must count the carbohydrates in each food that is included.  The following list contains standard serving sizes of common carbohydrate-rich foods. Each of these servings has about 15 g of carbohydrates:   hamburger bun or  English muffin.   oz (15 mL) syrup.   oz (14 g) jelly.  1 slice of bread.  1 six-inch tortilla.  3 oz (85 g) cooked rice or pasta.  4 oz (113 g) cooked dried beans.  4 oz (113 g) starchy vegetable, such as peas, corn, or potatoes.  4 oz (113 g) hot cereal.  4 oz (113 g) mashed potatoes or  of a large baked potato.  4 oz (113 g) canned or frozen fruit.  4 oz (120 mL) fruit juice.  4-6 crackers.  6 chicken nuggets.  6 oz (170 g) unsweetened dry cereal.  6 oz (170 g) plain fat-free yogurt or yogurt sweetened with artificial sweeteners.  8 oz (240 mL) milk.  8 oz (170 g) fresh fruit or one small piece of fruit.  24 oz (680 g) popped popcorn.  Example of  carbohydrate counting Sample meal  3 oz (85 g) chicken breast.  6 oz (170 g) brown rice.  4 oz (113 g) corn.  8 oz (240 mL) milk.  8 oz (170 g) strawberries with sugar-free whipped topping. Carbohydrate calculation 1. Identify the foods that contain carbohydrates: ? Rice. ? Corn. ? Milk. ? Strawberries. 2. Calculate how many servings you have of each food: ? 2 servings rice. ? 1 serving corn. ? 1 serving milk. ? 1 serving strawberries. 3. Multiply each number of servings by 15 g: ? 2 servings  rice x 15 g = 30 g. ? 1 serving corn x 15 g = 15 g. ? 1 serving milk x 15 g = 15 g. ? 1 serving strawberries x 15 g = 15 g. 4. Add together all of the amounts to find the total grams of carbohydrates eaten: ? 30 g + 15 g + 15 g + 15 g = 75 g of carbohydrates total. This information is not intended to replace advice given to you by your health care provider. Make sure you discuss any questions you have with your health care provider. Document Released: 11/01/2005 Document Revised: 05/21/2016 Document Reviewed: 04/14/2016 Elsevier Interactive Patient Education  [employer].

## 2017-12-27 NOTE — Assessment & Plan Note (Signed)
Well controlled, no changes to meds. Encouraged heart healthy diet such as the DASH diet and exercise as tolerated.  °

## 2017-12-27 NOTE — Assessment & Plan Note (Signed)
Tolerating statin, encouraged heart healthy diet, avoid trans fats, minimize simple carbs and saturated fats. Increase exercise as tolerated 

## 2017-12-27 NOTE — Progress Notes (Signed)
Subjective:  I acted as a Education administrator for Dr. Charlett Blake. [place], [place]  Patient ID: Gene [name], male    DOB: [birth year], 62 y.o.   MRN: [id number]  No chief complaint on file.   HPI  Patient is in today for a 3 month follow up and over all he is doing well. No polyuria or polydipsia. No recent febrile illness or acute hospitalizations. Notes his foot pain and peripheral neuropathy are much improved with orthotics and gabapentin. Denies CP/palp/SOB/HA/congestion/fevers/GI or GU c/o. Taking meds as prescribed  Patient Care Team: Mosie Lukes, MD as PCP - General (Family Medicine) Newt Minion, MD as Consulting Physician (Orthopedic Surgery)   Past Medical History:  Diagnosis Date  . Anemia   . CAD (coronary artery disease)   . Chronic pain in right foot   . Diabetes ([place])   . Diabetes mellitus type II   . Diabetic neuropathy ([place])   . GERD (gastroesophageal reflux disease)   . H/O tobacco use, presenting hazards to health 05/21/2009   Qualifier: Diagnosis of  By: Wynona Luna  Last cigarette smoked May 30, 2015    . History of colonic polyps   . Hyperlipidemia   . Hypertension   . Insomnia 03/25/2013  . Neuromuscular disorder ([place])    NEUROPATHY  . Obesity 06/24/2016  . Plantar fasciitis   . Preventative health care 05/03/2014  . PVD (peripheral vascular disease) ([place])    PTA RLE 12/08 [place]  . TOBACCO USER 05/21/2009   Qualifier: Diagnosis of  By: Wynona Luna  Last cigarette smoked May 30, 2015      Past Surgical History:  Procedure Laterality Date  . CORONARY ARTERY BYPASS GRAFT     200 SVG to diagonal, LIMA to LAD, SVG to LCX  . OTHER SURGICAL HISTORY     PAD surgery  . REVISION TOTAL KNEE ARTHROPLASTY      Family History  Problem Relation Age of Onset  . Coronary artery disease Mother   . Hypertension Mother   . Hyperlipidemia Father   . Aneurysm Father        brain  . Hyperlipidemia Unknown   . Hypertension Unknown   . Stroke Unknown   .  Cancer Maternal Uncle   . Colon cancer Neg Hx   . Esophageal cancer Neg Hx     Social History   Socioeconomic History  . Marital status: Married    Spouse name: Not on file  . Number of children: Not on file  . Years of education: Not on file  . Highest education level: Not on file  Social Needs  . Financial resource strain: Not on file  . Food insecurity - worry: Not on file  . Food insecurity - inability: Not on file  . Transportation needs - medical: Not on file  . Transportation needs - non-medical: Not on file  Occupational History  . Not on file  Tobacco Use  . Smoking status: Former Smoker    Packs/day: 0.50    Years: 40.00    Pack years: 20.00    Types: Cigarettes    Last attempt to quit: 05/04/2015    Years since quitting: 2.6  . Smokeless tobacco: Never Used  Substance and Sexual Activity  . Alcohol use: No    Alcohol/week: 0.0 oz  . Drug use: No    Comment: rare  . Sexual activity: Yes    Comment: lives with wife, retired   Other Topics  Concern  . Not on file  Social History Narrative   Occupation: grocery clerk   Second marriage - 3 children from first marriage   Tobacco Use - Yes.     Alcohol Use - yes(rare)            Outpatient Medications Prior to Visit  Medication Sig Dispense Refill  . amLODipine (NORVASC) 10 MG tablet Take 1 tablet (10 mg total) by mouth daily. 90 tablet 1  . atorvastatin (LIPITOR) 10 MG tablet Take 1 tablet (10 mg total) by mouth daily. 90 tablet 1  . fish oil-omega-3 fatty acids 1000 MG capsule Take 1 g by mouth at bedtime.      Marland Kitchen glimepiride (AMARYL) 4 MG tablet Take 1 tablet (4 mg total) by mouth 2 (two) times daily. 180 tablet 1  . Insulin Glargine (LANTUS SOLOSTAR) 100 UNIT/ML Solostar Pen Inject 25 Units into the skin daily at 10 pm. 15 mL 5  . Insulin Pen Needle (RELION PEN NEEDLES) 29G X 12MM MISC USE  ONCE DAILY IN THE EVENING 100 each 5  . labetalol (NORMODYNE) 200 MG tablet Take 1 tablet (200 mg total) by mouth 2  (two) times daily. 180 tablet 1  . lisinopril (PRINIVIL,ZESTRIL) 40 MG tablet TAKE 1 TABLET BY MOUTH ONCE DAILY 90 tablet 1  . metFORMIN (GLUCOPHAGE-XR) 500 MG 24 hr tablet TAKE TWO TABLETS BY MOUTH TWICE DAILY 360 tablet 2  . Multiple Vitamins-Minerals (CENTRUM PO) Take by mouth daily.      . nitroGLYCERIN (NITROSTAT) 0.4 MG SL tablet Place 0.4 mg under the tongue every 5 (five) minutes as needed.      Marland Kitchen omeprazole (PRILOSEC) 40 MG capsule Take 1 capsule (40 mg total) by mouth daily. 90 capsule 1  . BD ULTRA-FINE LANCETS lancets Check daily and prn  DX 250.00 100 each 1  . calcipotriene (DOVONOX) 0.005 % cream APPLY CREAM TOPICALLY TWICE DAILY. PATIENT NEEDS AN APPOINTMENT 60 g 0  . gabapentin (NEURONTIN) 300 MG capsule Take 1 capsule (300 mg total) by mouth 3 (three) times daily. 270 capsule 1   No facility-administered medications prior to visit.     No Known Allergies  Review of Systems  Constitutional: Negative for fever and malaise/fatigue.  HENT: Negative for congestion.   Eyes: Negative for blurred vision.  Respiratory: Negative for shortness of breath.   Cardiovascular: Negative for chest pain, palpitations and leg swelling.  Gastrointestinal: Negative for abdominal pain, blood in stool and nausea.  Genitourinary: Negative for dysuria and frequency.  Musculoskeletal: Positive for joint pain. Negative for falls.  Skin: Negative for rash.  Neurological: Negative for dizziness, loss of consciousness and headaches.  Endo/Heme/Allergies: Negative for environmental allergies.  Psychiatric/Behavioral: Negative for depression. The patient is not nervous/anxious.        Objective:    Physical Exam  Constitutional: He is oriented to person, place, and time. He appears well-developed and well-nourished. No distress.  HENT:  Head: Normocephalic and atraumatic.  Right Ear: External ear normal.  Left Ear: External ear normal.  Nose: Nose normal.  Mouth/Throat: Oropharynx is clear  and moist. No oropharyngeal exudate.  Eyes: Conjunctivae and EOM are normal. Pupils are equal, round, and reactive to light. Right eye exhibits no discharge. Left eye exhibits no discharge.  Neck: Normal range of motion. Neck supple. No JVD present. No thyromegaly present.  Cardiovascular: Normal rate, regular rhythm and intact distal pulses.  No murmur heard. Pulmonary/Chest: Effort normal and breath sounds normal. No respiratory distress. He  has no wheezes. He has no rales. He exhibits no tenderness.  Abdominal: Soft. Bowel sounds are normal. He exhibits no distension and no mass. There is no tenderness. There is no rebound and no guarding.  Genitourinary: Prostate normal and penis normal.  Musculoskeletal: Normal range of motion. He exhibits no edema or tenderness.  Lymphadenopathy:    He has no cervical adenopathy.  Neurological: He is alert and oriented to person, place, and time. He displays normal reflexes. No cranial nerve deficit. He exhibits normal muscle tone.  Skin: Skin is warm and dry. No rash noted. He is not diaphoretic. No erythema.  Psychiatric: He has a normal mood and affect. His behavior is normal. Judgment and thought content normal.    BP 140/62   Pulse 62   Temp 98.2 F (36.8 C) (Oral)   Resp 18   Wt 221 lb 3.2 oz (100.3 kg)   SpO2 98%   BMI 29.18 kg/m  Wt Readings from Last 3 Encounters:  12/27/17 221 lb 3.2 oz (100.3 kg)  08/23/17 219 lb 12.8 oz (99.7 kg)  10/28/16 217 lb 3.2 oz (98.5 kg)   BP Readings from Last 3 Encounters:  12/28/17 140/62  08/23/17 140/80  10/28/16 135/66     Immunization History  Administered Date(s) Administered  . Influenza Split 10/06/2011, 08/11/2012, 08/19/2015  . Influenza Whole 07/15/2009, 10/26/2010  . Influenza,inj,Quad PF,6+ Mos 08/20/2013, 11/01/2014, 10/28/2016, 08/23/2017  . Pneumococcal Conjugate-13 06/24/2016  . Pneumococcal Polysaccharide-23 07/24/2008, 08/19/2015  . Td 08/08/2007  . Tdap 12/27/2017  .  Zoster 10/28/2016    Health Maintenance  Topic Date Due  . Hepatitis C Screening  1956/02/21  . HIV Screening  01/15/1970  . FOOT EXAM  01/08/2012  . OPHTHALMOLOGY EXAM  10/12/2015  . COLONOSCOPY  01/10/2016  . HEMOGLOBIN A1C  06/08/2018  . TETANUS/TDAP  12/28/2027  . PNEUMOCOCCAL POLYSACCHARIDE VACCINE  Completed  . INFLUENZA VACCINE  Completed    Lab Results  Component Value Date   WBC 6.5 12/09/2017   HGB 11.7 (L) 12/09/2017   HCT 36.0 (L) 12/09/2017   PLT 400.0 12/09/2017   GLUCOSE 158 (H) 12/09/2017   CHOL 115 12/09/2017   TRIG 147.0 12/09/2017   HDL 24.60 (L) 12/09/2017   LDLCALC 61 12/09/2017   ALT 15 12/09/2017   AST 18 12/09/2017   NA 137 12/09/2017   K 4.8 12/09/2017   CL 97 12/09/2017   CREATININE 1.12 12/09/2017   BUN 21 12/09/2017   CO2 31 12/09/2017   TSH 1.42 12/09/2017   PSA 9.05 (H) 10/26/2016   INR 1.1 07/31/2008   HGBA1C 8.8 (H) 12/09/2017   MICROALBUR 4.1 (H) 03/15/2016    Lab Results  Component Value Date   TSH 1.42 12/09/2017   Lab Results  Component Value Date   WBC 6.5 12/09/2017   HGB 11.7 (L) 12/09/2017   HCT 36.0 (L) 12/09/2017   MCV 86.7 12/09/2017   PLT 400.0 12/09/2017   Lab Results  Component Value Date   NA 137 12/09/2017   K 4.8 12/09/2017   CO2 31 12/09/2017   GLUCOSE 158 (H) 12/09/2017   BUN 21 12/09/2017   CREATININE 1.12 12/09/2017   BILITOT 0.4 12/09/2017   ALKPHOS 89 12/09/2017   AST 18 12/09/2017   ALT 15 12/09/2017   PROT 7.5 12/09/2017   ALBUMIN 4.3 12/09/2017   CALCIUM 9.6 12/09/2017   GFR 70.40 12/09/2017   Lab Results  Component Value Date   CHOL 115 12/09/2017   Lab  Results  Component Value Date   HDL 24.60 (L) 12/09/2017   Lab Results  Component Value Date   LDLCALC 61 12/09/2017   Lab Results  Component Value Date   TRIG 147.0 12/09/2017   Lab Results  Component Value Date   CHOLHDL 5 12/09/2017   Lab Results  Component Value Date   HGBA1C 8.8 (H) 12/09/2017          Assessment & Plan:   Problem List Items Addressed This Visit    Diabetes mellitus type 2 in obese ([place])    hgba1c minimally elevated, minimize simple carbs. Increase exercise as tolerated. Continue current meds      Hyperlipidemia, mixed    Tolerating statin, encouraged heart healthy diet, avoid trans fats, minimize simple carbs and saturated fats. Increase exercise as tolerated      Relevant Orders   Lipid panel   Essential hypertension    Well controlled, no changes to meds. Encouraged heart healthy diet such as the DASH diet and exercise as tolerated.       Relevant Orders   CBC   Comprehensive metabolic panel   TSH   FOOT PAIN, RIGHT    Greatly improved with orthotics and Gabapentin. He typically takes 600 mg twice daily but on a long day at work with increased pain he has to take a third tab.       Anemia   Relevant Orders   Ambulatory referral to Gastroenterology   CBC   Preventative health care    Other Visit Diagnoses    Polyp of colon, unspecified part of colon, unspecified type    -  Primary   Relevant Orders   Ambulatory referral to Gastroenterology   Controlled type 2 diabetes mellitus with complication, with long-term current use of insulin ([place])       Relevant Orders   Hemoglobin A1c      I have discontinued Shanon Payor BD ULTRA-FINE LANCETS, calcipotriene, and gabapentin. I am also having him start on gabapentin. Additionally, I am having him maintain his Multiple Vitamins-Minerals (CENTRUM PO), nitroGLYCERIN, fish oil-omega-3 fatty acids, Insulin Glargine, metFORMIN, lisinopril, labetalol, amLODipine, atorvastatin, Insulin Pen Needle, omeprazole, and glimepiride.  Meds ordered this encounter  Medications  . gabapentin (NEURONTIN) 600 MG tablet    Sig: 1 tab po bid and a third tab daily as needed for severe    Dispense:  270 tablet    Refill:  1    Patient not in need of refill presently he will call when he needs refill    CMA served  as scribe during this visit. History, Physical and Plan performed by medical provider. Documentation and orders reviewed and attested to.  Penni Homans, MD

## 2017-12-28 ENCOUNTER — Encounter: Payer: Self-pay | Admitting: Gastroenterology

## 2017-12-28 NOTE — Assessment & Plan Note (Signed)
hgba1c minimally elevated, minimize simple carbs. Increase exercise as tolerated. Continue current meds

## 2017-12-28 NOTE — Assessment & Plan Note (Signed)
Greatly improved with orthotics and Gabapentin. He typically takes 600 mg twice daily but on a long day at work with increased pain he has to take a third tab.

## 2018-01-19 ENCOUNTER — Other Ambulatory Visit: Payer: Self-pay

## 2018-01-19 ENCOUNTER — Ambulatory Visit (AMBULATORY_SURGERY_CENTER): Payer: Self-pay

## 2018-01-19 VITALS — Ht 71.0 in | Wt 223.0 lb

## 2018-01-19 DIAGNOSIS — Z86010 Personal history of colonic polyps: Secondary | ICD-10-CM

## 2018-01-19 MED ORDER — NA SULFATE-K SULFATE-MG SULF 17.5-3.13-1.6 GM/177ML PO SOLN: 1.0000 | Freq: Once | ORAL | 0 refills | Status: AC

## 2018-01-19 NOTE — Progress Notes (Signed)
Denies allergies to eggs or soy products. Denies complication of anesthesia or sedation. Denies use of weight loss medication. Denies use of O2.   Emmi instructions declined.  

## 2018-01-25 ENCOUNTER — Encounter: Payer: Self-pay | Admitting: Gastroenterology

## 2018-01-31 ENCOUNTER — Other Ambulatory Visit: Payer: Self-pay | Admitting: Family Medicine

## 2018-02-08 ENCOUNTER — Other Ambulatory Visit: Payer: Self-pay

## 2018-02-08 ENCOUNTER — Encounter: Payer: Self-pay | Admitting: Gastroenterology

## 2018-02-08 ENCOUNTER — Ambulatory Visit (AMBULATORY_SURGERY_CENTER): Payer: Federal, State, Local not specified - PPO | Admitting: Gastroenterology

## 2018-02-08 VITALS — BP 128/73 | HR 75 | Temp 98.9°F | Resp 12 | Ht 71.0 in | Wt 223.0 lb

## 2018-02-08 DIAGNOSIS — D124 Benign neoplasm of descending colon: Secondary | ICD-10-CM

## 2018-02-08 DIAGNOSIS — D123 Benign neoplasm of transverse colon: Secondary | ICD-10-CM

## 2018-02-08 DIAGNOSIS — D125 Benign neoplasm of sigmoid colon: Secondary | ICD-10-CM

## 2018-02-08 DIAGNOSIS — Z86010 Personal history of colonic polyps: Secondary | ICD-10-CM | POA: Diagnosis present

## 2018-02-08 DIAGNOSIS — K635 Polyp of colon: Secondary | ICD-10-CM

## 2018-02-08 MED ORDER — SODIUM CHLORIDE 0.9 % IV SOLN: 500.0000 mL | Freq: Once | INTRAVENOUS | Status: DC

## 2018-02-08 NOTE — Progress Notes (Signed)
Called to room to assist during endoscopic procedure.  Patient ID and intended procedure confirmed with present staff. Received instructions for my participation in the procedure from the performing physician.  

## 2018-02-08 NOTE — Progress Notes (Signed)
Report to RN, VSS, adequate respirations noted, no c/o pain or discomfort 

## 2018-02-08 NOTE — Op Note (Signed)
[facility] Patient Name: Gene [name] Procedure Date: 02/08/2018 8:28 AM MRN: [id number] Endoscopist: Ladene Artist , MD Age: 62 Referring MD:  Date of Birth: [birth year] Gender: Male Account #: [id number] Procedure:                Colonoscopy Indications:              Surveillance: Personal history of adenomatous                            polyps on last colonoscopy 5 years ago Medicines:                Monitored Anesthesia Care Procedure:                Pre-Anesthesia Assessment:                           - Prior to the procedure, a History and Physical                            was performed, and patient medications and                            allergies were reviewed. The patient's tolerance of                            previous anesthesia was also reviewed. The risks                            and benefits of the procedure and the sedation                            options and risks were discussed with the patient.                            All questions were answered, and informed consent                            was obtained. Prior Anticoagulants: The patient has                            taken no previous anticoagulant or antiplatelet                            agents. ASA Grade Assessment: II - A patient with                            mild systemic disease. After reviewing the risks                            and benefits, the patient was deemed in                            satisfactory condition to undergo the procedure.  After obtaining informed consent, the colonoscope                            was passed under direct vision. Throughout the                            procedure, the patient's blood pressure, pulse, and                            oxygen saturations were monitored continuously. The                            Colonoscope was introduced through the anus and                            advanced to the the cecum,  identified by                            appendiceal orifice and ileocecal valve. The                            ileocecal valve, appendiceal orifice, and rectum                            were photographed. The quality of the bowel                            preparation was good. The colonoscopy was performed                            without difficulty. The patient tolerated the                            procedure well. Scope In: 8:36:29 AM Scope Out: 9:13:11 AM Scope Withdrawal Time: 0 hours 31 minutes 56 seconds  Total Procedure Duration: 0 hours 36 minutes 42 seconds  Findings:                 The perianal and digital rectal examinations were                            normal.                           A 30 mm polyp was found in the transverse colon.                            The polyp was sessile. It wrapped around a fold.                            The polyp was removed with a piecemeal technique                            using a hot snare. Resection and retrieval were  complete. Two areas were tattooed with two                            injections of 2 mL of Spot (carbon black) each on                            the distal margin of the polypectomy site.                           Six sessile polyps were found in the sigmoid colon                            (1), descending colon (4) and transverse colon (1).                            The polyps were 5 to 7 mm in size. These polyps                            were removed with a cold snare. Resection and                            retrieval were complete.                           Internal hemorrhoids were found during                            retroflexion. The hemorrhoids were small and Grade                            I (internal hemorrhoids that do not prolapse).                           The exam was otherwise without abnormality on                            direct and retroflexion  views. Complications:            No immediate complications. Estimated blood loss:                            None. Estimated Blood Loss:     Estimated blood loss: none. Impression:               - One 30 mm polyp in the transverse colon, removed                            piecemeal using a hot snare. Resected and                            retrieved. Tattooed.                           - Six 5 to 7 mm polyps in the sigmoid colon, in the  descending colon and in the transverse colon,                            removed with a cold snare. Resected and retrieved.                           - Internal hemorrhoids.                           - The examination was otherwise normal on direct                            and retroflexion views. Recommendation:           - Repeat colonoscopy in 6 months for surveillance                            after piecemeal polypectomy.                           - Patient has a contact number available for                            emergencies. The signs and symptoms of potential                            delayed complications were discussed with the                            patient. Return to normal activities tomorrow.                            Written discharge instructions were provided to the                            patient.                           - Resume previous diet.                           - Continue present medications.                           - Await pathology results.                           - No aspirin, ibuprofen, naproxen, or other                            non-steroidal anti-inflammatory drugs for 2 weeks                            after polyp removal. Ladene Artist, MD 02/08/2018 9:19:00 AM This report has been signed electronically.

## 2018-02-08 NOTE — Patient Instructions (Signed)
NO NSAIDS ( ASPIRIN, IBUPROFEN NAPROXEN ) FOR 2 WEEKS.  HANDOUTS GIVEN FOR POLYPS AND HEMORRHOIDS.  YOU HAD AN ENDOSCOPIC PROCEDURE TODAY AT [facility]:   Refer to the procedure report that was given to you for any specific questions about what was found during the examination.  If the procedure report does not answer your questions, please call your gastroenterologist to clarify.  If you requested that your care partner not be given the details of your procedure findings, then the procedure report has been included in a sealed envelope for you to review at your convenience later.  YOU SHOULD EXPECT: Some feelings of bloating in the abdomen. Passage of more gas than usual.  Walking can help get rid of the air that was put into your GI tract during the procedure and reduce the bloating. If you had a lower endoscopy (such as a colonoscopy or flexible sigmoidoscopy) you may notice spotting of blood in your stool or on the toilet paper. If you underwent a bowel prep for your procedure, you may not have a normal bowel movement for a few days.  Please Note:  You might notice some irritation and congestion in your nose or some drainage.  This is from the oxygen used during your procedure.  There is no need for concern and it should clear up in a day or so.  SYMPTOMS TO REPORT IMMEDIATELY:   Following lower endoscopy (colonoscopy or flexible sigmoidoscopy):  Excessive amounts of blood in the stool  Significant tenderness or worsening of abdominal pains  Swelling of the abdomen that is new, acute  Fever of 100F or higher  For urgent or emergent issues, a gastroenterologist can be reached at any hour by calling [phone].   DIET:  We do recommend a small meal at first, but then you may proceed to your regular diet.  Drink plenty of fluids but you should avoid alcoholic beverages for 24 hours.  ACTIVITY:  You should plan to take it easy for the rest of today and you should NOT  DRIVE or use heavy machinery until tomorrow (because of the sedation medicines used during the test).    FOLLOW UP: Our staff will call the number listed on your records the next business day following your procedure to check on you and address any questions or concerns that you may have regarding the information given to you following your procedure. If we do not reach you, we will leave a message.  However, if you are feeling well and you are not experiencing any problems, there is no need to return our call.  We will assume that you have returned to your regular daily activities without incident.  If any biopsies were taken you will be contacted by phone or by letter within the next 1-3 weeks.  Please call us at [phone] if you have not heard about the biopsies in 3 weeks.    SIGNATURES/CONFIDENTIALITY: You and/or your care partner have signed paperwork which will be entered into your electronic medical record.  These signatures attest to the fact that that the information above on your After Visit Summary has been reviewed and is understood.  Full responsibility of the confidentiality of this discharge information lies with you and/or your care-partner.

## 2018-02-09 ENCOUNTER — Telehealth: Payer: Self-pay | Admitting: *Deleted

## 2018-02-09 ENCOUNTER — Telehealth: Payer: Self-pay | Admitting: Gastroenterology

## 2018-02-09 NOTE — Telephone Encounter (Signed)
  Follow up Call-  Call back number 02/08/2018  Post procedure Call Back phone  # [phone]  Permission to leave phone message Yes  Some recent data might be hidden     Patient questions:  Message left to call us if necessary. Second call.

## 2018-02-09 NOTE — Telephone Encounter (Signed)
Left message for patient to call back  

## 2018-02-09 NOTE — Telephone Encounter (Signed)
  Follow up Call-  Call back number 02/08/2018  Post procedure Call Back phone  # [phone]  Permission to leave phone message Yes  Some recent data might be hidden     Patient questions:  Message left to call us if necessary.

## 2018-02-10 NOTE — Telephone Encounter (Signed)
I spoke with the patient at approximately 02/09/18 1650.  Patient had reported that he had pain in his back and shoulder area   and chills during the night.  He did state that he had not been feeling well and his wife had similar symptoms.  He was tolerating a diet with no complaints. He did not notice if he was passing gas, but had had no BM.   No further chills, fever, or abdominal pain, rectal bleeding,  and he denied CP today.  He did state that he was still very stiff in the back and neck area "but otherwise I feel fine".  He was advised that chills and stiffness were not normal.  He is advised that stiffness could be due to positioning during the procedure.  He will call back if his symptoms return or has any other worrisome complaints.  He verbalized understanding that he should avoid all NSAID due to polyp removal.

## 2018-02-15 ENCOUNTER — Encounter: Payer: Self-pay | Admitting: Gastroenterology

## 2018-02-18 ENCOUNTER — Other Ambulatory Visit: Payer: Self-pay | Admitting: Family Medicine

## 2018-02-21 ENCOUNTER — Ambulatory Visit: Payer: Federal, State, Local not specified - PPO | Admitting: Family Medicine

## 2018-03-23 ENCOUNTER — Other Ambulatory Visit: Payer: Self-pay | Admitting: Family Medicine

## 2018-04-14 ENCOUNTER — Other Ambulatory Visit: Payer: Self-pay | Admitting: Family Medicine

## 2018-04-24 ENCOUNTER — Other Ambulatory Visit (INDEPENDENT_AMBULATORY_CARE_PROVIDER_SITE_OTHER): Payer: Federal, State, Local not specified - PPO

## 2018-04-24 DIAGNOSIS — I10 Essential (primary) hypertension: Secondary | ICD-10-CM | POA: Diagnosis not present

## 2018-04-24 DIAGNOSIS — D649 Anemia, unspecified: Secondary | ICD-10-CM

## 2018-04-24 DIAGNOSIS — Z794 Long term (current) use of insulin: Secondary | ICD-10-CM

## 2018-04-24 DIAGNOSIS — E782 Mixed hyperlipidemia: Secondary | ICD-10-CM | POA: Diagnosis not present

## 2018-04-24 DIAGNOSIS — E118 Type 2 diabetes mellitus with unspecified complications: Secondary | ICD-10-CM | POA: Diagnosis not present

## 2018-04-24 LAB — LIPID PANEL
Cholesterol: 111 mg/dL (ref 0–200)
HDL: 24.4 mg/dL — ABNORMAL LOW (ref >39.00)
LDL Cholesterol: 58 mg/dL (ref 0–99)
NonHDL: 87.07
Total CHOL/HDL Ratio: 5
Triglycerides: 146 mg/dL (ref 0.0–149.0)
VLDL: 29.2 mg/dL (ref 0.0–40.0)

## 2018-04-24 LAB — COMPREHENSIVE METABOLIC PANEL
ALT: 11 U/L (ref 0–53)
AST: 15 U/L (ref 0–37)
Albumin: 4.3 g/dL (ref 3.5–5.2)
Alkaline Phosphatase: 91 U/L (ref 39–117)
BUN: 17 mg/dL (ref 6–23)
CO2: 31 mEq/L (ref 19–32)
Calcium: 9.3 mg/dL (ref 8.4–10.5)
Chloride: 102 mEq/L (ref 96–112)
Creatinine, Ser: 1.06 mg/dL (ref 0.40–1.50)
GFR: 74.93 mL/min (ref >60.00)
Glucose, Bld: 91 mg/dL (ref 70–99)
Potassium: 4.5 mEq/L (ref 3.5–5.1)
Sodium: 141 mEq/L (ref 135–145)
Total Bilirubin: 0.3 mg/dL (ref 0.2–1.2)
Total Protein: 7 g/dL (ref 6.0–8.3)

## 2018-04-24 LAB — CBC
HCT: 35.3 % — ABNORMAL LOW (ref 39.0–52.0)
Hemoglobin: 11.5 g/dL — ABNORMAL LOW (ref 13.0–17.0)
MCHC: 32.5 g/dL (ref 30.0–36.0)
MCV: 86.3 fl (ref 78.0–100.0)
Platelets: 394 10*3/uL (ref 150.0–400.0)
RBC: 4.09 Mil/uL — ABNORMAL LOW (ref 4.22–5.81)
RDW: 15.1 % (ref 11.5–15.5)
WBC: 7.3 10*3/uL (ref 4.0–10.5)

## 2018-04-24 LAB — TSH: TSH: 2.43 u[IU]/mL (ref 0.35–4.50)

## 2018-04-24 LAB — HEMOGLOBIN A1C: Hgb A1c MFr Bld: 8.2 % — ABNORMAL HIGH (ref 4.6–6.5)

## 2018-04-27 ENCOUNTER — Encounter: Payer: Federal, State, Local not specified - PPO | Admitting: Family Medicine

## 2018-05-02 ENCOUNTER — Ambulatory Visit (INDEPENDENT_AMBULATORY_CARE_PROVIDER_SITE_OTHER): Payer: Federal, State, Local not specified - PPO | Admitting: Family Medicine

## 2018-05-02 ENCOUNTER — Encounter: Payer: Self-pay | Admitting: Family Medicine

## 2018-05-02 VITALS — BP 112/68 | HR 65 | Temp 97.8°F | Resp 18 | Ht 71.0 in | Wt 221.8 lb

## 2018-05-02 DIAGNOSIS — R0602 Shortness of breath: Secondary | ICD-10-CM | POA: Diagnosis not present

## 2018-05-02 DIAGNOSIS — Z86010 Personal history of colonic polyps: Secondary | ICD-10-CM

## 2018-05-02 DIAGNOSIS — Z0000 Encounter for general adult medical examination without abnormal findings: Secondary | ICD-10-CM

## 2018-05-02 DIAGNOSIS — E782 Mixed hyperlipidemia: Secondary | ICD-10-CM

## 2018-05-02 DIAGNOSIS — R350 Frequency of micturition: Secondary | ICD-10-CM | POA: Diagnosis not present

## 2018-05-02 DIAGNOSIS — F1010 Alcohol abuse, uncomplicated: Secondary | ICD-10-CM

## 2018-05-02 DIAGNOSIS — E1169 Type 2 diabetes mellitus with other specified complication: Secondary | ICD-10-CM

## 2018-05-02 DIAGNOSIS — D649 Anemia, unspecified: Secondary | ICD-10-CM

## 2018-05-02 DIAGNOSIS — E669 Obesity, unspecified: Secondary | ICD-10-CM

## 2018-05-02 DIAGNOSIS — I10 Essential (primary) hypertension: Secondary | ICD-10-CM | POA: Diagnosis not present

## 2018-05-02 DIAGNOSIS — G63 Polyneuropathy in diseases classified elsewhere: Secondary | ICD-10-CM

## 2018-05-02 HISTORY — DX: Shortness of breath: R06.02

## 2018-05-02 LAB — MICROALBUMIN / CREATININE URINE RATIO
Creatinine,U: 53.3 mg/dL
Microalb Creat Ratio: 16.8 mg/g (ref 0.0–30.0)
Microalb, Ur: 8.9 mg/dL — ABNORMAL HIGH (ref 0.0–1.9)

## 2018-05-02 LAB — FERRITIN: Ferritin: 10.1 ng/mL — ABNORMAL LOW (ref 22.0–322.0)

## 2018-05-02 LAB — PSA: PSA: 6.95 ng/mL — ABNORMAL HIGH (ref 0.10–4.00)

## 2018-05-02 MED ORDER — ATORVASTATIN CALCIUM 10 MG PO TABS: 10.0000 mg | ORAL_TABLET | Freq: Every day | ORAL | 1 refills | Status: DC

## 2018-05-02 MED ORDER — LABETALOL HCL 200 MG PO TABS: 200.0000 mg | ORAL_TABLET | Freq: Two times a day (BID) | ORAL | 1 refills | Status: DC

## 2018-05-02 MED ORDER — GABAPENTIN 600 MG PO TABS: ORAL_TABLET | ORAL | 1 refills | Status: DC

## 2018-05-02 MED ORDER — OMEPRAZOLE 40 MG PO CPDR: 40.0000 mg | DELAYED_RELEASE_CAPSULE | Freq: Every day | ORAL | 1 refills | Status: DC

## 2018-05-02 MED ORDER — AMLODIPINE BESYLATE 10 MG PO TABS: 10.0000 mg | ORAL_TABLET | Freq: Every day | ORAL | 1 refills | Status: DC

## 2018-05-02 NOTE — Assessment & Plan Note (Signed)
Stable but does struggle with daily foot pain encouraged compression hose, orthotics, referral to podiatry he declines for now.

## 2018-05-02 NOTE — Assessment & Plan Note (Signed)
Tolerating statin, encouraged heart healthy diet, avoid trans fats, minimize simple carbs and saturated fats. Increase exercise as tolerated 

## 2018-05-02 NOTE — Assessment & Plan Note (Signed)
No consumption recently

## 2018-05-02 NOTE — Progress Notes (Signed)
Subjective:  I acted as a Education administrator for Dr. Charlett Blake. [place], [place]  Patient ID: Gene [name], male    DOB: 1955-12-29, 62 y.o.   MRN: [id number]  No chief complaint on file.   HPI  Patient is in today for an annual exam and follow up on chronic medical concerns including hypertension, coronary artery disease, diabetes and hyperlipidemia. He acknowledges he has had 2 episodes of chest discomfort with some shortness of breath and palpitations. No pattern as to when this occurs. Denies polyuria or polydipsia. Tries to maintain a diabetic diet. Manages activities of daily living well. He continues to struggle with peripheral neuropathy and bilateral foot pain. Denies HA/congestion/fevers/GI or GU c/o. Taking meds as prescribed  Patient Care Team: Mosie Lukes, MD as PCP - General (Family Medicine) Newt Minion, MD as Consulting Physician (Orthopedic Surgery)   Past Medical History:  Diagnosis Date  . Anemia   . CAD (coronary artery disease)   . Chronic pain in right foot   . Diabetes ([place])   . Diabetes mellitus type II   . Diabetic neuropathy ([place])   . GERD (gastroesophageal reflux disease)   . H/O tobacco use, presenting hazards to health 05/21/2009   Qualifier: Diagnosis of  By: Wynona Luna  Last cigarette smoked May 30, 2015    . History of colonic polyps   . Hyperlipidemia   . Hypertension   . Insomnia 03/25/2013  . Myocardial infarction ([place])   . Neuromuscular disorder ([place])    NEUROPATHY  . Obesity 06/24/2016  . Plantar fasciitis   . Preventative health care 05/03/2014  . PVD (peripheral vascular disease) ([place])    PTA RLE 12/08 [place]  . TOBACCO USER 05/21/2009   Qualifier: Diagnosis of  By: Wynona Luna  Last cigarette smoked May 30, 2015      Past Surgical History:  Procedure Laterality Date  . CORONARY ARTERY BYPASS GRAFT     200 SVG to diagonal, LIMA to LAD, SVG to LCX  . OTHER SURGICAL HISTORY     PAD surgery  . REVISION TOTAL KNEE ARTHROPLASTY        Family History  Problem Relation Age of Onset  . Coronary artery disease Mother   . Hypertension Mother   . Hyperlipidemia Father   . Aneurysm Father        brain  . Hyperlipidemia Unknown   . Hypertension Unknown   . Stroke Unknown   . Cancer Maternal Uncle   . Colon cancer Neg Hx   . Esophageal cancer Neg Hx   . Liver cancer Neg Hx   . Pancreatic cancer Neg Hx   . Rectal cancer Neg Hx   . Stomach cancer Neg Hx     Social History   Socioeconomic History  . Marital status: Married    Spouse name: Not on file  . Number of children: Not on file  . Years of education: Not on file  . Highest education level: Not on file  Occupational History  . Not on file  Social Needs  . Financial resource strain: Not on file  . Food insecurity:    Worry: Not on file    Inability: Not on file  . Transportation needs:    Medical: Not on file    Non-medical: Not on file  Tobacco Use  . Smoking status: Former Smoker    Packs/day: 0.50    Years: 40.00    Pack years: 20.00  Types: Cigarettes    Last attempt to quit: 05/04/2015    Years since quitting: 2.9  . Smokeless tobacco: Never Used  Substance and Sexual Activity  . Alcohol use: No    Alcohol/week: 0.0 oz  . Drug use: No    Comment: rare  . Sexual activity: Yes    Comment: lives with wife, retired   Lifestyle  . Physical activity:    Days per week: Not on file    Minutes per session: Not on file  . Stress: Not on file  Relationships  . Social connections:    Talks on phone: Not on file    Gets together: Not on file    Attends religious service: Not on file    Active member of club or organization: Not on file    Attends meetings of clubs or organizations: Not on file    Relationship status: Not on file  . Intimate partner violence:    Fear of current or ex partner: Not on file    Emotionally abused: Not on file    Physically abused: Not on file    Forced sexual activity: Not on file  Other Topics Concern  .  Not on file  Social History Narrative   Occupation: grocery clerk   Second marriage - 3 children from first marriage   Tobacco Use - Yes.     Alcohol Use - yes(rare)            Outpatient Medications Prior to Visit  Medication Sig Dispense Refill  . fish oil-omega-3 fatty acids 1000 MG capsule Take 1 g by mouth at bedtime.      Marland Kitchen glimepiride (AMARYL) 4 MG tablet TAKE 1 TABLET BY MOUTH TWICE DAILY 180 tablet 1  . Insulin Pen Needle (RELION PEN NEEDLES) 29G X 12MM MISC USE  ONCE DAILY IN THE EVENING 100 each 5  . LANTUS SOLOSTAR 100 UNIT/ML Solostar Pen INJECT 32 UNITS SUBCUTANEOUSLY ONCE DAILY AT 10 PM. 15 mL 5  . lisinopril (PRINIVIL,ZESTRIL) 40 MG tablet TAKE 1 TABLET BY MOUTH ONCE DAILY 90 tablet 1  . metFORMIN (GLUCOPHAGE-XR) 500 MG 24 hr tablet TAKE 2 TABLETS BY MOUTH TWICE DAILY 360 tablet 2  . Multiple Vitamins-Minerals (CENTRUM PO) Take by mouth daily.      . nitroGLYCERIN (NITROSTAT) 0.4 MG SL tablet Place 0.4 mg under the tongue every 5 (five) minutes as needed.      Marland Kitchen amLODipine (NORVASC) 10 MG tablet Take 1 tablet (10 mg total) by mouth daily. 90 tablet 1  . atorvastatin (LIPITOR) 10 MG tablet Take 1 tablet (10 mg total) by mouth daily. 90 tablet 1  . gabapentin (NEURONTIN) 600 MG tablet 1 tab po bid and a third tab daily as needed for severe 270 tablet 1  . labetalol (NORMODYNE) 200 MG tablet Take 1 tablet (200 mg total) by mouth 2 (two) times daily. 180 tablet 1  . omeprazole (PRILOSEC) 40 MG capsule Take 1 capsule (40 mg total) by mouth daily. 90 capsule 1  . 0.9 %  sodium chloride infusion      No facility-administered medications prior to visit.     No Known Allergies  Review of Systems  Constitutional: Positive for malaise/fatigue. Negative for chills and fever.  HENT: Negative for congestion and hearing loss.   Eyes: Negative for discharge.  Respiratory: Positive for shortness of breath. Negative for cough and sputum production.   Cardiovascular: Positive for  chest pain. Negative for palpitations and leg swelling.  Gastrointestinal: Negative for abdominal pain, blood in stool, constipation, diarrhea, heartburn, nausea and vomiting.  Genitourinary: Negative for dysuria, frequency, hematuria and urgency.  Musculoskeletal: Positive for joint pain and myalgias. Negative for back pain and falls.  Skin: Negative for rash.  Neurological: Negative for dizziness, sensory change, loss of consciousness, weakness and headaches.  Endo/Heme/Allergies: Negative for environmental allergies. Does not bruise/bleed easily.  Psychiatric/Behavioral: Negative for depression and suicidal ideas. The patient is not nervous/anxious and does not have insomnia.        Objective:    Physical Exam  Constitutional: He is oriented to person, place, and time. He appears well-developed and well-nourished. No distress.  HENT:  Head: Normocephalic and atraumatic.  Eyes: Conjunctivae are normal.  Neck: Neck supple. No thyromegaly present.  Cardiovascular: Normal rate, regular rhythm and normal heart sounds.  No murmur heard. Pulmonary/Chest: Effort normal and breath sounds normal. No respiratory distress. He has no wheezes.  Abdominal: Soft. Bowel sounds are normal. He exhibits no mass. There is no tenderness.  Musculoskeletal: He exhibits no edema.  Lymphadenopathy:    He has no cervical adenopathy.  Neurological: He is alert and oriented to person, place, and time.  Skin: Skin is warm and dry.  Psychiatric: He has a normal mood and affect. His behavior is normal.    BP 112/68 (BP Location: Left Arm, Patient Position: Sitting, Cuff Size: Normal)   Pulse 65   Temp 97.8 F (36.6 C) (Oral)   Resp 18   Ht 5\' 11"  (1.803 m)   Wt 221 lb 12.8 oz (100.6 kg)   SpO2 98%   BMI 30.93 kg/m  Wt Readings from Last 3 Encounters:  05/02/18 221 lb 12.8 oz (100.6 kg)  02/08/18 223 lb (101.2 kg)  01/19/18 223 lb (101.2 kg)   BP Readings from Last 3 Encounters:  05/02/18 112/68    02/08/18 128/73  12/28/17 140/62     Immunization History  Administered Date(s) Administered  . Influenza Split 10/06/2011, 08/11/2012, 08/19/2015  . Influenza Whole 07/15/2009, 10/26/2010  . Influenza,inj,Quad PF,6+ Mos 08/20/2013, 11/01/2014, 10/28/2016, 08/23/2017  . Pneumococcal Conjugate-13 06/24/2016  . Pneumococcal Polysaccharide-23 07/24/2008, 08/19/2015  . Td 08/08/2007  . Tdap 12/27/2017  . Zoster 10/28/2016    Health Maintenance  Topic Date Due  . Hepatitis C Screening  1956-01-27  . HIV Screening  01/15/1970  . FOOT EXAM  01/08/2012  . OPHTHALMOLOGY EXAM  10/12/2015  . INFLUENZA VACCINE  06/15/2018  . HEMOGLOBIN A1C  10/24/2018  . COLONOSCOPY  02/08/2021  . TETANUS/TDAP  12/28/2027  . PNEUMOCOCCAL POLYSACCHARIDE VACCINE  Completed    Lab Results  Component Value Date   WBC 7.3 04/24/2018   HGB 11.5 (L) 04/24/2018   HCT 35.3 (L) 04/24/2018   PLT 394.0 04/24/2018   GLUCOSE 91 04/24/2018   CHOL 111 04/24/2018   TRIG 146.0 04/24/2018   HDL 24.40 (L) 04/24/2018   LDLCALC 58 04/24/2018   ALT 11 04/24/2018   AST 15 04/24/2018   NA 141 04/24/2018   K 4.5 04/24/2018   CL 102 04/24/2018   CREATININE 1.06 04/24/2018   BUN 17 04/24/2018   CO2 31 04/24/2018   TSH 2.43 04/24/2018   PSA 9.05 (H) 10/26/2016   INR 1.1 07/31/2008   HGBA1C 8.2 (H) 04/24/2018   MICROALBUR 4.1 (H) 03/15/2016    Lab Results  Component Value Date   TSH 2.43 04/24/2018   Lab Results  Component Value Date   WBC 7.3 04/24/2018   HGB 11.5 (L)  04/24/2018   HCT 35.3 (L) 04/24/2018   MCV 86.3 04/24/2018   PLT 394.0 04/24/2018   Lab Results  Component Value Date   NA 141 04/24/2018   K 4.5 04/24/2018   CO2 31 04/24/2018   GLUCOSE 91 04/24/2018   BUN 17 04/24/2018   CREATININE 1.06 04/24/2018   BILITOT 0.3 04/24/2018   ALKPHOS 91 04/24/2018   AST 15 04/24/2018   ALT 11 04/24/2018   PROT 7.0 04/24/2018   ALBUMIN 4.3 04/24/2018   CALCIUM 9.3 04/24/2018   GFR 74.93  04/24/2018   Lab Results  Component Value Date   CHOL 111 04/24/2018   Lab Results  Component Value Date   HDL 24.40 (L) 04/24/2018   Lab Results  Component Value Date   LDLCALC 58 04/24/2018   Lab Results  Component Value Date   TRIG 146.0 04/24/2018   Lab Results  Component Value Date   CHOLHDL 5 04/24/2018   Lab Results  Component Value Date   HGBA1C 8.2 (H) 04/24/2018         Assessment & Plan:   Problem List Items Addressed This Visit    Diabetes mellitus type 2 in obese ([place])    hgba1c acceptable, minimize simple carbs. Increase exercise as tolerated. Continue current meds      Relevant Medications   atorvastatin (LIPITOR) 10 MG tablet   Other Relevant Orders   EKG 12-Lead   ECHOCARDIOGRAM COMPLETE   Ambulatory referral to Cardiology   Hemoglobin A1c   Microalbumin / creatinine urine ratio   Peripheral neuropathy    Stable but does struggle with daily foot pain encouraged compression hose, orthotics, referral to podiatry he declines for now.       Relevant Medications   gabapentin (NEURONTIN) 600 MG tablet   Hyperlipidemia, mixed    Tolerating statin, encouraged heart healthy diet, avoid trans fats, minimize simple carbs and saturated fats. Increase exercise as tolerated      Relevant Medications   labetalol (NORMODYNE) 200 MG tablet   atorvastatin (LIPITOR) 10 MG tablet   amLODipine (NORVASC) 10 MG tablet   Other Relevant Orders   Lipid panel   ALCOHOL ABUSE, EPISODIC    No consumption recently      Essential hypertension    Well controlled, no changes to meds. Encouraged heart healthy diet such as the DASH diet and exercise as tolerated.       Relevant Medications   labetalol (NORMODYNE) 200 MG tablet   atorvastatin (LIPITOR) 10 MG tablet   amLODipine (NORVASC) 10 MG tablet   Other Relevant Orders   CBC   Comprehensive metabolic panel   TSH   Microalbumin / creatinine urine ratio   COLONIC POLYPS, HX OF    Recent colonoscopy  showed 5 polyps to repeat in 6 months in September with Dr Fuller Plan      Anemia    Mild stable, check ferritin level      Relevant Orders   Ferritin   Preventative health care    Patient encouraged to maintain heart healthy diet, regular exercise, adequate sleep. Consider daily probiotics. Take medications as prescribed.       Obesity    Encouraged DASH diet, decrease po intake and increase exercise as tolerated. Needs 7-8 hours of sleep nightly. Avoid trans fats, eat small, frequent meals every 4-5 hours with lean proteins, complex carbs and healthy fats. Minimize simple carbs.      SOB (shortness of breath) on exertion - Primary    With  some chest tightness. 2 episodes in past 6 months that resolved spontaneously. With his history will proceed with EKG, Echo and referred back to cardiology for further consideration continue ECASA 162 to bid and keep NTG on his person to use prn      Relevant Orders   EKG 12-Lead   ECHOCARDIOGRAM COMPLETE   Ambulatory referral to Cardiology    Other Visit Diagnoses    Urinary frequency       Relevant Orders   PSA   Microalbumin / creatinine urine ratio      I am having [name] maintain his Multiple Vitamins-Minerals (CENTRUM PO), nitroGLYCERIN, fish oil-omega-3 fatty acids, Insulin Pen Needle, metFORMIN, LANTUS SOLOSTAR, lisinopril, glimepiride, labetalol, gabapentin, atorvastatin, amLODipine, and omeprazole. We will stop administering sodium chloride.  Meds ordered this encounter  Medications  . labetalol (NORMODYNE) 200 MG tablet    Sig: Take 1 tablet (200 mg total) by mouth 2 (two) times daily.    Dispense:  180 tablet    Refill:  1  . gabapentin (NEURONTIN) 600 MG tablet    Sig: 1 tab po bid and a third tab daily as needed for severe    Dispense:  270 tablet    Refill:  1    Patient not in need of refill presently he will call when he needs refill  . atorvastatin (LIPITOR) 10 MG tablet    Sig: Take 1 tablet (10 mg total) by  mouth daily.    Dispense:  90 tablet    Refill:  1  . amLODipine (NORVASC) 10 MG tablet    Sig: Take 1 tablet (10 mg total) by mouth daily.    Dispense:  90 tablet    Refill:  1  . omeprazole (PRILOSEC) 40 MG capsule    Sig: Take 1 capsule (40 mg total) by mouth daily.    Dispense:  90 capsule    Refill:  1    Please consider 90 day supplies to promote better adherence    CMA served as scribe during this visit. History, Physical and Plan performed by medical provider. Documentation and orders reviewed and attested to.  Penni Homans, MD

## 2018-05-02 NOTE — Assessment & Plan Note (Signed)
Well controlled, no changes to meds. Encouraged heart healthy diet such as the DASH diet and exercise as tolerated.  °

## 2018-05-02 NOTE — Assessment & Plan Note (Signed)
hgba1c acceptable, minimize simple carbs. Increase exercise as tolerated. Continue current meds 

## 2018-05-02 NOTE — Patient Instructions (Addendum)
Consider Hyland's leg cramp medicine from the pharmacy OTC  Preventive Care 40-64 Years, Male Preventive care refers to lifestyle choices and visits with your health care provider that can promote health and wellness. What does preventive care include?  A yearly physical exam. This is also called an annual well check.  Dental exams once or twice a year.  Routine eye exams. Ask your health care provider how often you should have your eyes checked.  Personal lifestyle choices, including: ? Daily care of your teeth and gums. ? Regular physical activity. ? Eating a healthy diet. ? Avoiding tobacco and drug use. ? Limiting alcohol use. ? Practicing safe sex. ? Taking low-dose aspirin every day starting at age 58. What happens during an annual well check? The services and screenings done by your health care provider during your annual well check will depend on your age, overall health, lifestyle risk factors, and family history of disease. Counseling Your health care provider may ask you questions about your:  Alcohol use.  Tobacco use.  Drug use.  Emotional well-being.  Home and relationship well-being.  Sexual activity.  Eating habits.  Work and work Statistician.  Screening You may have the following tests or measurements:  Height, weight, and BMI.  Blood pressure.  Lipid and cholesterol levels. These may be checked every 5 years, or more frequently if you are over 61 years old.  Skin check.  Lung cancer screening. You may have this screening every year starting at age 31 if you have a 30-pack-year history of smoking and currently smoke or have quit within the past 15 years.  Fecal occult blood test (FOBT) of the stool. You may have this test every year starting at age 83.  Flexible sigmoidoscopy or colonoscopy. You may have a sigmoidoscopy every 5 years or a colonoscopy every 10 years starting at age 48.  Prostate cancer screening. Recommendations will vary  depending on your family history and other risks.  Hepatitis C blood test.  Hepatitis B blood test.  Sexually transmitted disease (STD) testing.  Diabetes screening. This is done by checking your blood sugar (glucose) after you have not eaten for a while (fasting). You may have this done every 1-3 years.  Discuss your test results, treatment options, and if necessary, the need for more tests with your health care provider. Vaccines Your health care provider may recommend certain vaccines, such as:  Influenza vaccine. This is recommended every year.  Tetanus, diphtheria, and acellular pertussis (Tdap, Td) vaccine. You may need a Td booster every 10 years.  Varicella vaccine. You may need this if you have not been vaccinated.  Zoster vaccine. You may need this after age 61.  Measles, mumps, and rubella (MMR) vaccine. You may need at least one dose of MMR if you were born in 1957 or later. You may also need a second dose.  Pneumococcal 13-valent conjugate (PCV13) vaccine. You may need this if you have certain conditions and have not been vaccinated.  Pneumococcal polysaccharide (PPSV23) vaccine. You may need one or two doses if you smoke cigarettes or if you have certain conditions.  Meningococcal vaccine. You may need this if you have certain conditions.  Hepatitis A vaccine. You may need this if you have certain conditions or if you travel or work in places where you may be exposed to hepatitis A.  Hepatitis B vaccine. You may need this if you have certain conditions or if you travel or work in places where you may be exposed  to hepatitis B.  Haemophilus influenzae type b (Hib) vaccine. You may need this if you have certain risk factors.  Talk to your health care provider about which screenings and vaccines you need and how often you need them. This information is not intended to replace advice given to you by your health care provider. Make sure you discuss any questions you have  with your health care provider. Document Released: 11/28/2015 Document Revised: 07/21/2016 Document Reviewed: 09/02/2015 Elsevier Interactive Patient Education  [employer].

## 2018-05-02 NOTE — Assessment & Plan Note (Signed)
Mild stable, check ferritin level

## 2018-05-02 NOTE — Assessment & Plan Note (Signed)
Encouraged DASH diet, decrease po intake and increase exercise as tolerated. Needs 7-8 hours of sleep nightly. Avoid trans fats, eat small, frequent meals every 4-5 hours with lean proteins, complex carbs and healthy fats. Minimize simple carbs 

## 2018-05-02 NOTE — Assessment & Plan Note (Signed)
Recent colonoscopy showed 5 polyps to repeat in 6 months in September with Dr Fuller Plan

## 2018-05-02 NOTE — Assessment & Plan Note (Signed)
Patient encouraged to maintain heart healthy diet, regular exercise, adequate sleep. Consider daily probiotics. Take medications as prescribed 

## 2018-05-02 NOTE — Assessment & Plan Note (Signed)
With some chest tightness. 2 episodes in past 6 months that resolved spontaneously. With his history will proceed with EKG, Echo and referred back to cardiology for further consideration continue ECASA 162 to bid and keep NTG on his person to use prn

## 2018-05-08 MED ORDER — FERROUS FUMARATE 324 (106 FE) MG PO TABS: 1.0000 | ORAL_TABLET | Freq: Every day | ORAL | 3 refills | Status: DC

## 2018-05-08 NOTE — Addendum Note (Signed)
Addended by: Magdalene Molly A on: 05/08/2018 03:54 PM   Modules accepted: Orders

## 2018-05-11 ENCOUNTER — Encounter: Payer: Self-pay | Admitting: *Deleted

## 2018-05-11 ENCOUNTER — Encounter: Payer: Self-pay | Admitting: Cardiology

## 2018-05-11 ENCOUNTER — Ambulatory Visit: Payer: Federal, State, Local not specified - PPO | Admitting: Cardiology

## 2018-05-11 VITALS — BP 118/62 | HR 63 | Ht 71.0 in | Wt 217.0 lb

## 2018-05-11 DIAGNOSIS — Z951 Presence of aortocoronary bypass graft: Secondary | ICD-10-CM | POA: Diagnosis not present

## 2018-05-11 DIAGNOSIS — E782 Mixed hyperlipidemia: Secondary | ICD-10-CM

## 2018-05-11 DIAGNOSIS — I739 Peripheral vascular disease, unspecified: Secondary | ICD-10-CM

## 2018-05-11 DIAGNOSIS — I25118 Atherosclerotic heart disease of native coronary artery with other forms of angina pectoris: Secondary | ICD-10-CM

## 2018-05-11 DIAGNOSIS — G63 Polyneuropathy in diseases classified elsewhere: Secondary | ICD-10-CM | POA: Diagnosis not present

## 2018-05-11 HISTORY — DX: Presence of aortocoronary bypass graft: Z95.1

## 2018-05-11 MED ORDER — RANEXA 500 MG PO TB12: 500.0000 mg | ORAL_TABLET | Freq: Two times a day (BID) | ORAL | 3 refills | Status: DC

## 2018-05-11 MED ORDER — ATORVASTATIN CALCIUM 40 MG PO TABS: 40.0000 mg | ORAL_TABLET | Freq: Every day | ORAL | 3 refills | Status: DC

## 2018-05-11 NOTE — Patient Instructions (Signed)
Medication Instructions:  Your physician has recommended you make the following change in your medication:   INCREASE atorvastatin (lipitor) 40 mg daily  START ranexa 500 mg twice daily  Labwork: Your physician recommends that you return for lab work in 1 month: lipid panel, AST, ALT. You can come back to our office for follow up lab work, no appointment needed. Please fast beforehand.   Testing/Procedures: You had an EKG today.   Your physician has requested that you have a lexiscan myoview. For further information please visit [email or web address]. Please follow instruction sheet, as given.  Your physician has requested that you have a lower extremity arterial duplex. This test is an ultrasound of the arteries in the legs. It looks at arterial blood flow in the legs. Allow one hour for Lower Arterial scans. There are no restrictions or special instructions.  Your physician has requested that you have a duplex of your aorta and iliac arteries. During this test, an ultrasound is used to evaluate the aorta and iliac arteries. Allow 30 minutes for this exam. Do not eat after midnight the day before and avoid carbonated beverages.   Follow-Up: Your physician recommends that you schedule a follow-up appointment in: 1 month.  If you need a refill on your cardiac medications before your next appointment, please call your pharmacy.   Thank you for choosing [facility]! Robyne Peers, RN [phone]

## 2018-05-11 NOTE — Progress Notes (Signed)
Cardiology Consultation:    Date:  05/11/2018   ID:  Gene [name], DOB Feb 11, 1956, MRN [id number]  PCP:  Mosie Lukes, MD  Cardiologist:  Jenne Campus, MD   Referring MD: Mosie Lukes, MD   No chief complaint on file. I am having some chest pain  History of Present Illness:    Gene [name] is a 62 y.o. male who is being seen today for the evaluation of chest pain at the request of Mosie Lukes, MD.  He does have coronary artery disease.  In 2000 he ended up having bypass surgery.  Since that time it looks like in 2009 2010 he had episode of chest pain that required cardiac catheterization.  After that he was told to have some problem with the arteries in the back of his heart but no intervention has been needed.  Since that time he seems to be doing well but now he complained of having some chest pain as being on for about a year.  He can walk and climb stairs no difficulties but sometimes he will get some uneasy sensation in the chest that lasts for a few minutes there is no sweating no shortness of breath associated with it.  That has been going on for about a year there is no acceleration of the symptoms.  He also complained of having pain in his legs he thinks this is neuropathy but it looks also suspicion for claudication he walks he will develop pain in his legs.  He stopped pain goes away. He does have long-standing diabetes which is poorly controlled with latest hemoglobin A1c 8.1. He used to smoke but quit. He does have hypertension but that appears to be controlled. Does have dyslipidemia which is on cholesterol medication.  Past Medical History:  Diagnosis Date  . Anemia   . CAD (coronary artery disease)   . Chronic pain in right foot   . Diabetes ([place])   . Diabetes mellitus type II   . Diabetic neuropathy ([place])   . GERD (gastroesophageal reflux disease)   . H/O tobacco use, presenting hazards to health 05/21/2009   Qualifier: Diagnosis of  By: Wynona Luna  Last cigarette smoked May 30, 2015    . History of colonic polyps   . Hyperlipidemia   . Hypertension   . Insomnia 03/25/2013  . Myocardial infarction ([place])   . Neuromuscular disorder ([place])    NEUROPATHY  . Obesity 06/24/2016  . Plantar fasciitis   . Preventative health care 05/03/2014  . PVD (peripheral vascular disease) ([place])    PTA RLE 12/08 [place]  . TOBACCO USER 05/21/2009   Qualifier: Diagnosis of  By: Wynona Luna  Last cigarette smoked May 30, 2015      Past Surgical History:  Procedure Laterality Date  . CORONARY ARTERY BYPASS GRAFT     200 SVG to diagonal, LIMA to LAD, SVG to LCX  . OTHER SURGICAL HISTORY     PAD surgery  . REVISION TOTAL KNEE ARTHROPLASTY      Current Medications: Current Meds  Medication Sig  . amLODipine (NORVASC) 10 MG tablet Take 1 tablet (10 mg total) by mouth daily.  Marland Kitchen atorvastatin (LIPITOR) 10 MG tablet Take 1 tablet (10 mg total) by mouth daily.  . Ferrous Fumarate (HEMOCYTE) 324 (106 Fe) MG TABS tablet Take 1 tablet (106 mg of iron total) by mouth daily.  . fish oil-omega-3 fatty acids 1000 MG capsule Take 1  g by mouth at bedtime.    . gabapentin (NEURONTIN) 600 MG tablet 1 tab po bid and a third tab daily as needed for severe  . glimepiride (AMARYL) 4 MG tablet TAKE 1 TABLET BY MOUTH TWICE DAILY  . Insulin Pen Needle (RELION PEN NEEDLES) 29G X 12MM MISC USE  ONCE DAILY IN THE EVENING  . labetalol (NORMODYNE) 200 MG tablet Take 1 tablet (200 mg total) by mouth 2 (two) times daily.  Marland Kitchen LANTUS SOLOSTAR 100 UNIT/ML Solostar Pen INJECT 32 UNITS SUBCUTANEOUSLY ONCE DAILY AT 10 PM.  . lisinopril (PRINIVIL,ZESTRIL) 40 MG tablet TAKE 1 TABLET BY MOUTH ONCE DAILY  . metFORMIN (GLUCOPHAGE-XR) 500 MG 24 hr tablet TAKE 2 TABLETS BY MOUTH TWICE DAILY  . Multiple Vitamins-Minerals (CENTRUM PO) Take by mouth daily.    . nitroGLYCERIN (NITROSTAT) 0.4 MG SL tablet Place 0.4 mg under the tongue every 5 (five) minutes as needed.    Marland Kitchen omeprazole  (PRILOSEC) 40 MG capsule Take 1 capsule (40 mg total) by mouth daily.     Allergies:   Patient has no known allergies.   Social History   Socioeconomic History  . Marital status: Married    Spouse name: Not on file  . Number of children: Not on file  . Years of education: Not on file  . Highest education level: Not on file  Occupational History  . Not on file  Social Needs  . Financial resource strain: Not on file  . Food insecurity:    Worry: Not on file    Inability: Not on file  . Transportation needs:    Medical: Not on file    Non-medical: Not on file  Tobacco Use  . Smoking status: Former Smoker    Packs/day: 0.50    Years: 40.00    Pack years: 20.00    Types: Cigarettes    Last attempt to quit: 05/04/2015    Years since quitting: 3.0  . Smokeless tobacco: Never Used  Substance and Sexual Activity  . Alcohol use: No    Alcohol/week: 0.0 oz  . Drug use: No    Comment: rare  . Sexual activity: Yes    Comment: lives with wife, retired   Lifestyle  . Physical activity:    Days per week: Not on file    Minutes per session: Not on file  . Stress: Not on file  Relationships  . Social connections:    Talks on phone: Not on file    Gets together: Not on file    Attends religious service: Not on file    Active member of club or organization: Not on file    Attends meetings of clubs or organizations: Not on file    Relationship status: Not on file  Other Topics Concern  . Not on file  Social History Narrative   Occupation: grocery clerk   Second marriage - 3 children from first marriage   Tobacco Use - Yes.     Alcohol Use - yes(rare)             Family History: The patient's family history includes Aneurysm in his father; Cancer in his maternal uncle; Coronary artery disease in his mother; Hyperlipidemia in his father and unknown relative; Hypertension in his mother and unknown relative; Stroke in his unknown relative. There is no history of Colon cancer,  Esophageal cancer, Liver cancer, Pancreatic cancer, Rectal cancer, or Stomach cancer. ROS:   Please see the history of present illness.  All 14 point review of systems negative except as described per history of present illness.  EKGs/Labs/Other Studies Reviewed:    The following studies were reviewed today:   EKG:  EKG is  ordered today.  The ekg ordered today demonstrates normal sinus rhythm normal PR interval normal QS complex duration morphology nonspecific ST segment changes  Recent Labs: 04/24/2018: ALT 11; BUN 17; Creatinine, Ser 1.06; Hemoglobin 11.5; Platelets 394.0; Potassium 4.5; Sodium 141; TSH 2.43  Recent Lipid Panel    Component Value Date/Time   CHOL 111 04/24/2018 0906   TRIG 146.0 04/24/2018 0906   HDL 24.40 (L) 04/24/2018 0906   CHOLHDL 5 04/24/2018 0906   VLDL 29.2 04/24/2018 0906   LDLCALC 58 04/24/2018 0906    Physical Exam:    VS:  BP 118/62 (BP Location: Right Arm, Patient Position: Sitting, Cuff Size: Normal)   Pulse 63   Ht 5\' 11"  (1.803 m)   Wt 217 lb (98.4 kg)   SpO2 98%   BMI 30.27 kg/m     Wt Readings from Last 3 Encounters:  05/11/18 217 lb (98.4 kg)  05/02/18 221 lb 12.8 oz (100.6 kg)  02/08/18 223 lb (101.2 kg)     GEN:  Well nourished, well developed in no acute distress HEENT: Normal NECK: No JVD; No carotid bruits LYMPHATICS: No lymphadenopathy CARDIAC: RRR, no murmurs, no rubs, no gallops RESPIRATORY:  Clear to auscultation without rales, wheezing or rhonchi  ABDOMEN: Soft, non-tender, non-distended MUSCULOSKELETAL:  No edema; No deformity  SKIN: Warm and dry NEUROLOGIC:  Alert and oriented x 3 PSYCHIATRIC:  Normal affect   ASSESSMENT:    1. Coronary artery disease of native artery of native heart with stable angina pectoris ([place])   2. PERIPHERAL VASCULAR DISEASE   3. Polyneuropathy associated with underlying disease ([place])   4. Hyperlipidemia, mixed   5. Status post coronary artery bypass graft    PLAN:    In order  of problems listed above:  1. Coronary artery disease status post coronary artery bypass graft 19 years ago.  He does have some symptoms that are worrisome for reactivation of the problem overall it appears to be stable pattern.  Its [place] classification of 1-2.  I will ask him to start taking ranolazine 500 mg twice daily.  He is already on aspirin which I will continue he is already on statin which I will increase the dose.  He will be scheduled to have [place].  As a part of evaluation he is already scheduled to have echocardiogram which is very appropriate. 2. Peripheral vascular disease with apparently some stenting done in the right iliac arteries years ago.  He has somewhat suspicious symptoms I will ask him to have a aortoiliac duplex as well as duplex of lower extremities and arteries. 3. Dyslipidemia he should be on high intensity statin.  His LDL at baseline is 58 right now.  I will increase his Lipitor from 10-40.  I will ask him to have fasting lipid profile done as well as AST ALT within next 4 weeks.  See him back in my office in about a month or sooner if he has a problem   Medication Adjustments/Labs and Tests Ordered: Current medicines are reviewed at length with the patient today.  Concerns regarding medicines are outlined above.  No orders of the defined types were placed in this encounter.  No orders of the defined types were placed in this encounter.   Signed, Park Liter, MD, [facility]. 05/11/2018  11:32 AM    [facility]

## 2018-05-16 ENCOUNTER — Telehealth (HOSPITAL_COMMUNITY): Payer: Self-pay | Admitting: *Deleted

## 2018-05-16 NOTE — Telephone Encounter (Signed)
Patient given detailed instructions per Myocardial Perfusion Study Information Sheet for the test on 05/22/18 at 1000. Patient notified to arrive 15 minutes early and that it is imperative to arrive on time for appointment to keep from having the test rescheduled.  If you need to cancel or reschedule your appointment, please call the office within 24 hours of your appointment. . Patient verbalized understanding.Gene [name], Ranae Palms

## 2018-05-22 ENCOUNTER — Ambulatory Visit (HOSPITAL_BASED_OUTPATIENT_CLINIC_OR_DEPARTMENT_OTHER): Payer: Federal, State, Local not specified - PPO

## 2018-05-22 VITALS — Ht 71.0 in | Wt 217.0 lb

## 2018-05-22 DIAGNOSIS — E1169 Type 2 diabetes mellitus with other specified complication: Secondary | ICD-10-CM | POA: Diagnosis not present

## 2018-05-22 DIAGNOSIS — I700 Atherosclerosis of aorta: Secondary | ICD-10-CM | POA: Diagnosis not present

## 2018-05-22 DIAGNOSIS — E669 Obesity, unspecified: Secondary | ICD-10-CM | POA: Diagnosis not present

## 2018-05-22 DIAGNOSIS — I25118 Atherosclerotic heart disease of native coronary artery with other forms of angina pectoris: Secondary | ICD-10-CM

## 2018-05-22 DIAGNOSIS — Z951 Presence of aortocoronary bypass graft: Secondary | ICD-10-CM

## 2018-05-22 DIAGNOSIS — R0602 Shortness of breath: Secondary | ICD-10-CM | POA: Diagnosis not present

## 2018-05-22 DIAGNOSIS — I119 Hypertensive heart disease without heart failure: Secondary | ICD-10-CM | POA: Diagnosis not present

## 2018-05-22 LAB — MYOCARDIAL PERFUSION IMAGING
LV dias vol: 142 mL (ref 62–150)
LV sys vol: 80 mL
Peak HR: 96 {beats}/min
RATE: 0.31
Rest HR: 71 {beats}/min
SDS: 6
SRS: 14
SSS: 20
TID: 1.1

## 2018-05-22 MED ORDER — TECHNETIUM TC 99M TETROFOSMIN IV KIT
10.4000 | PACK | Freq: Once | INTRAVENOUS | Status: AC | PRN
  Administered 2018-05-22: 10.4 via INTRAVENOUS
  Filled 2018-05-22: qty 11

## 2018-05-22 MED ORDER — REGADENOSON 0.4 MG/5ML IV SOLN
0.4000 mg | Freq: Once | INTRAVENOUS | Status: AC
  Administered 2018-05-22: 0.4 mg via INTRAVENOUS

## 2018-05-22 MED ORDER — TECHNETIUM TC 99M TETROFOSMIN IV KIT
32.6000 | PACK | Freq: Once | INTRAVENOUS | Status: AC | PRN
  Administered 2018-05-22: 32.6 via INTRAVENOUS
  Filled 2018-05-22: qty 33

## 2018-05-23 ENCOUNTER — Ambulatory Visit (HOSPITAL_BASED_OUTPATIENT_CLINIC_OR_DEPARTMENT_OTHER)
Admission: RE | Admit: 2018-05-23 | Discharge: 2018-05-23 | Disposition: A | Discharge status: Home / Self Care | Payer: Federal, State, Local not specified - PPO | Source: Ambulatory Visit | Attending: Family Medicine | Admitting: Family Medicine

## 2018-05-23 DIAGNOSIS — E669 Obesity, unspecified: Secondary | ICD-10-CM

## 2018-05-23 DIAGNOSIS — I119 Hypertensive heart disease without heart failure: Secondary | ICD-10-CM | POA: Insufficient documentation

## 2018-05-23 DIAGNOSIS — E1169 Type 2 diabetes mellitus with other specified complication: Secondary | ICD-10-CM | POA: Insufficient documentation

## 2018-05-23 DIAGNOSIS — R0602 Shortness of breath: Secondary | ICD-10-CM

## 2018-05-23 DIAGNOSIS — I25118 Atherosclerotic heart disease of native coronary artery with other forms of angina pectoris: Secondary | ICD-10-CM | POA: Insufficient documentation

## 2018-05-23 DIAGNOSIS — I700 Atherosclerosis of aorta: Secondary | ICD-10-CM | POA: Insufficient documentation

## 2018-05-23 DIAGNOSIS — Z951 Presence of aortocoronary bypass graft: Secondary | ICD-10-CM | POA: Insufficient documentation

## 2018-05-23 NOTE — Progress Notes (Signed)
  Echocardiogram 2D Echocardiogram has been performed.  Joelene Millin 05/23/2018, 1:20 PM

## 2018-05-25 ENCOUNTER — Ambulatory Visit (HOSPITAL_BASED_OUTPATIENT_CLINIC_OR_DEPARTMENT_OTHER)
Admission: RE | Admit: 2018-05-25 | Discharge: 2018-05-25 | Disposition: A | Discharge status: Home / Self Care | Payer: Federal, State, Local not specified - PPO | Source: Ambulatory Visit | Attending: Cardiology | Admitting: Cardiology

## 2018-05-25 DIAGNOSIS — E1151 Type 2 diabetes mellitus with diabetic peripheral angiopathy without gangrene: Secondary | ICD-10-CM | POA: Diagnosis not present

## 2018-05-25 DIAGNOSIS — I10 Essential (primary) hypertension: Secondary | ICD-10-CM | POA: Insufficient documentation

## 2018-05-25 DIAGNOSIS — I739 Peripheral vascular disease, unspecified: Secondary | ICD-10-CM

## 2018-05-25 DIAGNOSIS — I708 Atherosclerosis of other arteries: Secondary | ICD-10-CM | POA: Insufficient documentation

## 2018-05-25 DIAGNOSIS — I2510 Atherosclerotic heart disease of native coronary artery without angina pectoris: Secondary | ICD-10-CM | POA: Diagnosis not present

## 2018-05-25 DIAGNOSIS — I70203 Unspecified atherosclerosis of native arteries of extremities, bilateral legs: Secondary | ICD-10-CM | POA: Diagnosis not present

## 2018-05-25 NOTE — Progress Notes (Signed)
*  PRELIMINARY RESULTS* Vascular Ultrasound Lower Extremity Arterial Duplex bilateral exam has been completed.  Preliminary findings: Bilateral diffuse atherosclerosis and mild to moderate stenosis.  Joelene Millin 05/25/2018, 2:50 PM

## 2018-05-25 NOTE — Progress Notes (Signed)
*  PRELIMINARY RESULTS* Vascular Ultrasound Abdominal artery duplex exam has been completed.  Preliminary findings: No Aortic aneurysm was identified.  Gene [name] 05/25/2018, 2:52 PM

## 2018-06-01 ENCOUNTER — Ambulatory Visit (HOSPITAL_BASED_OUTPATIENT_CLINIC_OR_DEPARTMENT_OTHER)
Admission: RE | Admit: 2018-06-01 | Discharge: 2018-06-01 | Disposition: A | Discharge status: Home / Self Care | Payer: Federal, State, Local not specified - PPO | Source: Ambulatory Visit | Attending: Cardiology | Admitting: Cardiology

## 2018-06-01 ENCOUNTER — Encounter: Payer: Self-pay | Admitting: Cardiology

## 2018-06-01 ENCOUNTER — Ambulatory Visit: Payer: Federal, State, Local not specified - PPO | Admitting: Cardiology

## 2018-06-01 VITALS — BP 118/70 | HR 66 | Ht 71.0 in | Wt 216.0 lb

## 2018-06-01 DIAGNOSIS — I10 Essential (primary) hypertension: Secondary | ICD-10-CM

## 2018-06-01 DIAGNOSIS — I739 Peripheral vascular disease, unspecified: Secondary | ICD-10-CM | POA: Diagnosis not present

## 2018-06-01 DIAGNOSIS — E782 Mixed hyperlipidemia: Secondary | ICD-10-CM

## 2018-06-01 DIAGNOSIS — R9439 Abnormal result of other cardiovascular function study: Secondary | ICD-10-CM | POA: Insufficient documentation

## 2018-06-01 DIAGNOSIS — Z951 Presence of aortocoronary bypass graft: Secondary | ICD-10-CM

## 2018-06-01 DIAGNOSIS — I25118 Atherosclerotic heart disease of native coronary artery with other forms of angina pectoris: Secondary | ICD-10-CM

## 2018-06-01 DIAGNOSIS — Z01818 Encounter for other preprocedural examination: Secondary | ICD-10-CM | POA: Diagnosis not present

## 2018-06-01 DIAGNOSIS — E1169 Type 2 diabetes mellitus with other specified complication: Secondary | ICD-10-CM

## 2018-06-01 DIAGNOSIS — E669 Obesity, unspecified: Secondary | ICD-10-CM | POA: Diagnosis not present

## 2018-06-01 HISTORY — DX: Abnormal result of other cardiovascular function study: R94.39

## 2018-06-01 MED ORDER — RANOLAZINE ER 500 MG PO TB12: 500.0000 mg | ORAL_TABLET | Freq: Two times a day (BID) | ORAL | Status: DC

## 2018-06-01 MED ORDER — ASPIRIN EC 81 MG PO TBEC: 81.0000 mg | DELAYED_RELEASE_TABLET | Freq: Every day | ORAL | 3 refills | Status: DC

## 2018-06-01 NOTE — H&P (View-Only) (Signed)
Cardiology Office Note:    Date:  06/01/2018   ID:  Gene [name], DOB 08-Aug-1956, MRN [id number]  PCP:  Mosie Lukes, MD  Cardiologist:  Jenne Campus, MD    Referring MD: Mosie Lukes, MD   Chief Complaint  Patient presents with  . 1 month follow up    Discuss echo  I am here to discuss results of my tests  History of Present Illness:    Gene [name] is a 62 y.o. male with past medical history significant for coronary artery disease.  More than 15 years ago he had coronary artery bypass graft.  Couple years ago he had cardiac catheterization done because of some symptoms which showed apparently some blockages on the artery on the back of his heart.  No intervention were needed for last few months he started experiencing some chest pain very concerning for angina stress test was done stress that showed ischemia involving LAD territory.  Even worse ejection fraction deteriorated during the stress testing.  I brought him to the office to talk about options in this situation option being medical therapy and he already try ranolazine with fairly good response a cardiac catheterization.  If his situation he is young age and quite extensive area of ischemia on the stress test as well as deterioration of left ventricular ejection fraction with exercise I think it would be reasonable to proceed with cardiac catheterization that is what I told him he had very procedure was explained to him including all risk benefits of weight loss alternatives.  We will proceed with cardiac catheterization.  Past Medical History:  Diagnosis Date  . Anemia   . CAD (coronary artery disease)   . Chronic pain in right foot   . Diabetes ([place])   . Diabetes mellitus type II   . Diabetic neuropathy ([place])   . GERD (gastroesophageal reflux disease)   . H/O tobacco use, presenting hazards to health 05/21/2009   Qualifier: Diagnosis of  By: Wynona Luna  Last cigarette smoked May 30, 2015    . History  of colonic polyps   . Hyperlipidemia   . Hypertension   . Insomnia 03/25/2013  . Myocardial infarction ([place])   . Neuromuscular disorder ([place])    NEUROPATHY  . Obesity 06/24/2016  . Plantar fasciitis   . Preventative health care 05/03/2014  . PVD (peripheral vascular disease) ([place])    PTA RLE 12/08 [place]  . TOBACCO USER 05/21/2009   Qualifier: Diagnosis of  By: Wynona Luna  Last cigarette smoked May 30, 2015      Past Surgical History:  Procedure Laterality Date  . CORONARY ARTERY BYPASS GRAFT     200 SVG to diagonal, LIMA to LAD, SVG to LCX  . OTHER SURGICAL HISTORY     PAD surgery  . REVISION TOTAL KNEE ARTHROPLASTY      Current Medications: Current Meds  Medication Sig  . amLODipine (NORVASC) 10 MG tablet Take 1 tablet (10 mg total) by mouth daily.  Marland Kitchen atorvastatin (LIPITOR) 40 MG tablet Take 1 tablet (40 mg total) by mouth daily.  . Cyanocobalamin (B-12) 100 MCG TABS Take by mouth.  . Ferrous Fumarate (HEMOCYTE) 324 (106 Fe) MG TABS tablet Take 1 tablet (106 mg of iron total) by mouth daily.  . fish oil-omega-3 fatty acids 1000 MG capsule Take 1 g by mouth at bedtime.    . gabapentin (NEURONTIN) 600 MG tablet 1 tab po bid and a third  tab daily as needed for severe  . glimepiride (AMARYL) 4 MG tablet TAKE 1 TABLET BY MOUTH TWICE DAILY  . Insulin Pen Needle (RELION PEN NEEDLES) 29G X 12MM MISC USE  ONCE DAILY IN THE EVENING  . labetalol (NORMODYNE) 200 MG tablet Take 1 tablet (200 mg total) by mouth 2 (two) times daily.  Marland Kitchen LANTUS SOLOSTAR 100 UNIT/ML Solostar Pen INJECT 32 UNITS SUBCUTANEOUSLY ONCE DAILY AT 10 PM.  . lisinopril (PRINIVIL,ZESTRIL) 40 MG tablet TAKE 1 TABLET BY MOUTH ONCE DAILY  . metFORMIN (GLUCOPHAGE-XR) 500 MG 24 hr tablet TAKE 2 TABLETS BY MOUTH TWICE DAILY  . Multiple Vitamins-Minerals (CENTRUM PO) Take by mouth daily.    . nitroGLYCERIN (NITROSTAT) 0.4 MG SL tablet Place 0.4 mg under the tongue every 5 (five) minutes as needed.    Marland Kitchen omeprazole  (PRILOSEC) 40 MG capsule Take 1 capsule (40 mg total) by mouth daily.  Marland Kitchen Specialty Vitamins Products (MAGNESIUM, AMINO ACID CHELATE,) 133 MG tablet Take 1 tablet by mouth daily.     Allergies:   Patient has no known allergies.   Social History   Socioeconomic History  . Marital status: Married    Spouse name: Not on file  . Number of children: Not on file  . Years of education: Not on file  . Highest education level: Not on file  Occupational History  . Not on file  Social Needs  . Financial resource strain: Not on file  . Food insecurity:    Worry: Not on file    Inability: Not on file  . Transportation needs:    Medical: Not on file    Non-medical: Not on file  Tobacco Use  . Smoking status: Former Smoker    Packs/day: 0.50    Years: 40.00    Pack years: 20.00    Types: Cigarettes    Last attempt to quit: 05/04/2015    Years since quitting: 3.0  . Smokeless tobacco: Never Used  Substance and Sexual Activity  . Alcohol use: No    Alcohol/week: 0.0 oz  . Drug use: No    Comment: rare  . Sexual activity: Yes    Comment: lives with wife, retired   Lifestyle  . Physical activity:    Days per week: Not on file    Minutes per session: Not on file  . Stress: Not on file  Relationships  . Social connections:    Talks on phone: Not on file    Gets together: Not on file    Attends religious service: Not on file    Active member of club or organization: Not on file    Attends meetings of clubs or organizations: Not on file    Relationship status: Not on file  Other Topics Concern  . Not on file  Social History Narrative   Occupation: grocery clerk   Second marriage - 3 children from first marriage   Tobacco Use - Yes.     Alcohol Use - yes(rare)             Family History: The patient's family history includes Aneurysm in his father; Cancer in his maternal uncle; Coronary artery disease in his mother; Hyperlipidemia in his father and unknown relative; Hypertension  in his mother and unknown relative; Stroke in his unknown relative. There is no history of Colon cancer, Esophageal cancer, Liver cancer, Pancreatic cancer, Rectal cancer, or Stomach cancer. ROS:   Please see the history of present illness.    All 14  point review of systems negative except as described per history of present illness  EKGs/Labs/Other Studies Reviewed:      Recent Labs: 04/24/2018: ALT 11; BUN 17; Creatinine, Ser 1.06; Hemoglobin 11.5; Platelets 394.0; Potassium 4.5; Sodium 141; TSH 2.43  Recent Lipid Panel    Component Value Date/Time   CHOL 111 04/24/2018 0906   TRIG 146.0 04/24/2018 0906   HDL 24.40 (L) 04/24/2018 0906   CHOLHDL 5 04/24/2018 0906   VLDL 29.2 04/24/2018 0906   LDLCALC 58 04/24/2018 0906    Physical Exam:    VS:  BP 118/70   Pulse 66   Ht 5\' 11"  (1.803 m)   Wt 216 lb (98 kg)   SpO2 98%   BMI 30.13 kg/m     Wt Readings from Last 3 Encounters:  06/01/18 216 lb (98 kg)  05/22/18 217 lb (98.4 kg)  05/11/18 217 lb (98.4 kg)     GEN:  Well nourished, well developed in no acute distress HEENT: Normal NECK: No JVD; No carotid bruits LYMPHATICS: No lymphadenopathy CARDIAC: RRR, no murmurs, no rubs, no gallops RESPIRATORY:  Clear to auscultation without rales, wheezing or rhonchi  ABDOMEN: Soft, non-tender, non-distended MUSCULOSKELETAL:  No edema; No deformity  SKIN: Warm and dry LOWER EXTREMITIES: no swelling NEUROLOGIC:  Alert and oriented x 3 PSYCHIATRIC:  Normal affect   ASSESSMENT:    1. Coronary artery disease of native artery of native heart with stable angina pectoris ([place])   2. Essential hypertension   3. PERIPHERAL VASCULAR DISEASE   4. Diabetes mellitus type 2 in obese ([place])   5. Hyperlipidemia, mixed   6. Status post coronary artery bypass graft   7. Abnormal stress test    PLAN:    In order of problems listed above:  1. Coronary artery disease abnormal stress test indicating ischemia in LAD territory.  Options  discussed as described above.  He agreed to proceed with cardiac catheterization procedure was explained to him including all risk benefits as well as alternatives.  Will proceed. 2. Essential hypertension blood pressure well controlled continue present management. 3. Peripheral vascular disease abdominal aortic ultrasounds as well as ultrasounds of lower extremities reviewed with the patient no critical lesion however multiple left lesions at multiple levels with 50 up to 75% stenosis. 4. Dyslipidemia last time I seen him by a increased dose of statin to reach high intensity statin.  Will recheck fasting lipid profile as well as AST ALT. 5. Status post coronary artery bypass graft done more than 15 years ago.  No intervention since that time required.   Medication Adjustments/Labs and Tests Ordered: Current medicines are reviewed at length with the patient today.  Concerns regarding medicines are outlined above.  No orders of the defined types were placed in this encounter.  Medication changes: No orders of the defined types were placed in this encounter.   Signed, Park Liter, MD, [facility] 06/01/2018 11:20 AM    [place]

## 2018-06-01 NOTE — Patient Instructions (Addendum)
Medication Instructions:  Your physician recommends that you continue on your current medications as directed. Please refer to the Current Medication list given to you today.   Labwork: Your physician recommends that you return for lab work today: BMP, CBC.   Testing/Procedures: A chest x-ray takes a picture of the organs and structures inside the chest, including the heart, lungs, and blood vessels. This test can show several things, including, whether the heart is enlarges; whether fluid is building up in the lungs; and whether pacemaker / defibrillator leads are still in place.    Gene [name] [place] [address], [place] [address] Dept: [phone] Loc: [phone]  Gene [name]  06/01/2018  You are scheduled for a Cardiac Catheterization on Thursday, July 25 with Dr. Glenetta Hew.  1. Please arrive at the [facility] (Main Entrance A) at [facility]: [address] [place], [place] [address] at 8:00 AM (two hours before your procedure to ensure your preparation). Free valet parking service is available.   Special note: Every effort is made to have your procedure done on time. Please understand that emergencies sometimes delay scheduled procedures.  2. Diet: Do not eat or drink anything after midnight prior to your procedure except sips of water to take medications.  3. Labs: None needed.  4. Medication instructions in preparation for your procedure:   Current Outpatient Medications (Endocrine & Metabolic):  .  glimepiride (AMARYL) 4 MG tablet, TAKE 1 TABLET BY MOUTH TWICE DAILY .  LANTUS SOLOSTAR 100 UNIT/ML Solostar Pen, INJECT 32 UNITS SUBCUTANEOUSLY ONCE DAILY AT 10 PM. .  metFORMIN (GLUCOPHAGE-XR) 500 MG 24 hr tablet, TAKE 2 TABLETS BY MOUTH TWICE DAILY  Current Outpatient Medications (Cardiovascular):  .  amLODipine (NORVASC) 10 MG tablet, Take 1 tablet (10 mg  total) by mouth daily. Marland Kitchen  atorvastatin (LIPITOR) 40 MG tablet, Take 1 tablet (40 mg total) by mouth daily. Marland Kitchen  labetalol (NORMODYNE) 200 MG tablet, Take 1 tablet (200 mg total) by mouth 2 (two) times daily. Marland Kitchen  lisinopril (PRINIVIL,ZESTRIL) 40 MG tablet, TAKE 1 TABLET BY MOUTH ONCE DAILY .  nitroGLYCERIN (NITROSTAT) 0.4 MG SL tablet, Place 0.4 mg under the tongue every 5 (five) minutes as needed.      Current Outpatient Medications (Hematological):  Marland Kitchen  Cyanocobalamin (B-12) 100 MCG TABS, Take by mouth. .  Ferrous Fumarate (HEMOCYTE) 324 (106 Fe) MG TABS tablet, Take 1 tablet (106 mg of iron total) by mouth daily.  Current Outpatient Medications (Other):  .  fish oil-omega-3 fatty acids 1000 MG capsule, Take 1 g by mouth at bedtime.   .  gabapentin (NEURONTIN) 600 MG tablet, 1 tab po bid and a third tab daily as needed for severe .  Insulin Pen Needle (RELION PEN NEEDLES) 29G X 12MM MISC, USE  ONCE DAILY IN THE EVENING .  Multiple Vitamins-Minerals (CENTRUM PO), Take by mouth daily.   Marland Kitchen  omeprazole (PRILOSEC) 40 MG capsule, Take 1 capsule (40 mg total) by mouth daily. Marland Kitchen  Specialty Vitamins Products (MAGNESIUM, AMINO ACID CHELATE,) 133 MG tablet, Take 1 tablet by mouth daily. *For reference purposes while preparing patient instructions.   Delete this med list prior to printing instructions for patient.*  Stop taking, Glucophage (Metformin) on Thursday, July 25.    Take only 17 units of insulin the night before your procedure. Do not take any insulin on the day of the procedure. Do not take your  morning dose of glimepiride (amaryl) the morning of the procedure.   On the morning of your procedure, take your Aspirin and any morning medicines NOT listed above.  You may use sips of water.  5. Plan for one night stay--bring personal belongings. 6. Bring a current list of your medications and current insurance cards. 7. You MUST have a responsible person to drive you home. 8. Someone MUST be with  you the first 24 hours after you arrive home or your discharge will be delayed. 9. Please wear clothes that are easy to get on and off and wear slip-on shoes.  Thank you for allowing [place] to care for you!   -- [facility]   Follow-Up: Your physician recommends that you schedule a follow-up appointment in: 1 month.  If you need a refill on your cardiac medications before your next appointment, please call your pharmacy.   Thank you for choosing [facility]! Robyne Peers, RN [phone]    Coronary Angiogram With Stent Coronary angiogram with stent placement is a procedure to widen or open a narrow blood vessel of the heart (coronary artery). Arteries may become blocked by cholesterol buildup (plaques) in the lining of the wall. When a coronary artery becomes partially blocked, blood flow to that area decreases. This may lead to chest pain or a heart attack (myocardial infarction). A stent is a small piece of metal that looks like mesh or a spring. Stent placement may be done as treatment for a heart attack or right after a coronary angiogram in which a blocked artery is found. Let your health care provider know about:  Any allergies you have.  All medicines you are taking, including vitamins, herbs, eye drops, creams, and over-the-counter medicines.  Any problems you or family members have had with anesthetic medicines.  Any blood disorders you have.  Any surgeries you have had.  Any medical conditions you have.  Whether you are pregnant or may be pregnant. What are the risks? Generally, this is a safe procedure. However, problems may occur, including:  Damage to the heart or its blood vessels.  A return of blockage.  Bleeding, infection, or bruising at the insertion site.  A collection of blood under the skin (hematoma) at the insertion site.  A blood clot in another part of the body.  Kidney injury.  Allergic reaction to  the dye or contrast that is used.  Bleeding into the abdomen (retroperitoneal bleeding).  What happens before the procedure? Staying hydrated Follow instructions from your health care provider about hydration, which may include:  Up to 2 hours before the procedure - you may continue to drink clear liquids, such as water, clear fruit juice, black coffee, and plain tea.  Eating and drinking restrictions Follow instructions from your health care provider about eating and drinking, which may include:  8 hours before the procedure - stop eating heavy meals or foods such as meat, fried foods, or fatty foods.  6 hours before the procedure - stop eating light meals or foods, such as toast or cereal.  2 hours before the procedure - stop drinking clear liquids.  Ask your health care provider about:  Changing or stopping your regular medicines. This is especially important if you are taking diabetes medicines or blood thinners.  Taking medicines such as ibuprofen. These medicines can thin your blood. Do not take these medicines before your procedure if your health care provider instructs you not to. Generally, aspirin is recommended before a  procedure of passing a small, thin tube (catheter) through a blood vessel and into the heart (cardiac catheterization).  What happens during the procedure?  An IV tube will be inserted into one of your veins.  You will be given one or more of the following: ? A medicine to help you relax (sedative). ? A medicine to numb the area where the catheter will be inserted into an artery (local anesthetic).  To reduce your risk of infection: ? Your health care team will wash or sanitize their hands. ? Your skin will be washed with soap. ? Hair may be removed from the area where the catheter will be inserted.  Using a guide wire, the catheter will be inserted into an artery. The location may be in your groin, in your wrist, or in the fold of your arm (near your  elbow).  A type of X-ray (fluoroscopy) will be used to help guide the catheter to the opening of the arteries in the heart.  A dye will be injected into the catheter, and X-rays will be taken. The dye will help to show where any narrowing or blockages are located in the arteries.  A tiny wire will be guided to the blocked spot, and a balloon will be inflated to make the artery wider.  The stent will be expanded and will crush the plaques into the wall of the vessel. The stent will hold the area open and improve the blood flow. Most stents have a drug coating to reduce the risk of the stent narrowing over time.  The artery may be made wider using a drill, laser, or other tools to remove plaques.  When the blood flow is better, the catheter will be removed. The lining of the artery will grow over the stent, which stays where it was placed. This procedure may vary among health care providers and hospitals. What happens after the procedure?  If the procedure is done through the leg, you will be kept in bed lying flat for about 6 hours. You will be instructed to not bend and not cross your legs.  The insertion site will be checked frequently.  The pulse in your foot or wrist will be checked frequently.  You may have additional blood tests, X-rays, and a test that records the electrical activity of your heart (electrocardiogram, or ECG). This information is not intended to replace advice given to you by your health care provider. Make sure you discuss any questions you have with your health care provider. Document Released: 05/08/2003 Document Revised: 07/01/2016 Document Reviewed: 06/06/2016 Elsevier Interactive Patient Education  [employer].

## 2018-06-01 NOTE — Progress Notes (Signed)
Cardiology Office Note:    Date:  06/01/2018   ID:  Gene [name], DOB 12-Oct-1956, MRN [id number]  PCP:  Mosie Lukes, MD  Cardiologist:  Jenne Campus, MD    Referring MD: Mosie Lukes, MD   Chief Complaint  Patient presents with  . 1 month follow up    Discuss echo  I am here to discuss results of my tests  History of Present Illness:    Gene [name] is a 62 y.o. male with past medical history significant for coronary artery disease.  More than 15 years ago he had coronary artery bypass graft.  Couple years ago he had cardiac catheterization done because of some symptoms which showed apparently some blockages on the artery on the back of his heart.  No intervention were needed for last few months he started experiencing some chest pain very concerning for angina stress test was done stress that showed ischemia involving LAD territory.  Even worse ejection fraction deteriorated during the stress testing.  I brought him to the office to talk about options in this situation option being medical therapy and he already try ranolazine with fairly good response a cardiac catheterization.  If his situation he is young age and quite extensive area of ischemia on the stress test as well as deterioration of left ventricular ejection fraction with exercise I think it would be reasonable to proceed with cardiac catheterization that is what I told him he had very procedure was explained to him including all risk benefits of weight loss alternatives.  We will proceed with cardiac catheterization.  Past Medical History:  Diagnosis Date  . Anemia   . CAD (coronary artery disease)   . Chronic pain in right foot   . Diabetes ([place])   . Diabetes mellitus type II   . Diabetic neuropathy ([place])   . GERD (gastroesophageal reflux disease)   . H/O tobacco use, presenting hazards to health 05/21/2009   Qualifier: Diagnosis of  By: Wynona Luna  Last cigarette smoked May 30, 2015    . History  of colonic polyps   . Hyperlipidemia   . Hypertension   . Insomnia 03/25/2013  . Myocardial infarction ([place])   . Neuromuscular disorder ([place])    NEUROPATHY  . Obesity 06/24/2016  . Plantar fasciitis   . Preventative health care 05/03/2014  . PVD (peripheral vascular disease) ([place])    PTA RLE 12/08 [place]  . TOBACCO USER 05/21/2009   Qualifier: Diagnosis of  By: Wynona Luna  Last cigarette smoked May 30, 2015      Past Surgical History:  Procedure Laterality Date  . CORONARY ARTERY BYPASS GRAFT     200 SVG to diagonal, LIMA to LAD, SVG to LCX  . OTHER SURGICAL HISTORY     PAD surgery  . REVISION TOTAL KNEE ARTHROPLASTY      Current Medications: Current Meds  Medication Sig  . amLODipine (NORVASC) 10 MG tablet Take 1 tablet (10 mg total) by mouth daily.  Marland Kitchen atorvastatin (LIPITOR) 40 MG tablet Take 1 tablet (40 mg total) by mouth daily.  . Cyanocobalamin (B-12) 100 MCG TABS Take by mouth.  . Ferrous Fumarate (HEMOCYTE) 324 (106 Fe) MG TABS tablet Take 1 tablet (106 mg of iron total) by mouth daily.  . fish oil-omega-3 fatty acids 1000 MG capsule Take 1 g by mouth at bedtime.    . gabapentin (NEURONTIN) 600 MG tablet 1 tab po bid and a third  tab daily as needed for severe  . glimepiride (AMARYL) 4 MG tablet TAKE 1 TABLET BY MOUTH TWICE DAILY  . Insulin Pen Needle (RELION PEN NEEDLES) 29G X 12MM MISC USE  ONCE DAILY IN THE EVENING  . labetalol (NORMODYNE) 200 MG tablet Take 1 tablet (200 mg total) by mouth 2 (two) times daily.  Marland Kitchen LANTUS SOLOSTAR 100 UNIT/ML Solostar Pen INJECT 32 UNITS SUBCUTANEOUSLY ONCE DAILY AT 10 PM.  . lisinopril (PRINIVIL,ZESTRIL) 40 MG tablet TAKE 1 TABLET BY MOUTH ONCE DAILY  . metFORMIN (GLUCOPHAGE-XR) 500 MG 24 hr tablet TAKE 2 TABLETS BY MOUTH TWICE DAILY  . Multiple Vitamins-Minerals (CENTRUM PO) Take by mouth daily.    . nitroGLYCERIN (NITROSTAT) 0.4 MG SL tablet Place 0.4 mg under the tongue every 5 (five) minutes as needed.    Marland Kitchen omeprazole  (PRILOSEC) 40 MG capsule Take 1 capsule (40 mg total) by mouth daily.  Marland Kitchen Specialty Vitamins Products (MAGNESIUM, AMINO ACID CHELATE,) 133 MG tablet Take 1 tablet by mouth daily.     Allergies:   Patient has no known allergies.   Social History   Socioeconomic History  . Marital status: Married    Spouse name: Not on file  . Number of children: Not on file  . Years of education: Not on file  . Highest education level: Not on file  Occupational History  . Not on file  Social Needs  . Financial resource strain: Not on file  . Food insecurity:    Worry: Not on file    Inability: Not on file  . Transportation needs:    Medical: Not on file    Non-medical: Not on file  Tobacco Use  . Smoking status: Former Smoker    Packs/day: 0.50    Years: 40.00    Pack years: 20.00    Types: Cigarettes    Last attempt to quit: 05/04/2015    Years since quitting: 3.0  . Smokeless tobacco: Never Used  Substance and Sexual Activity  . Alcohol use: No    Alcohol/week: 0.0 oz  . Drug use: No    Comment: rare  . Sexual activity: Yes    Comment: lives with wife, retired   Lifestyle  . Physical activity:    Days per week: Not on file    Minutes per session: Not on file  . Stress: Not on file  Relationships  . Social connections:    Talks on phone: Not on file    Gets together: Not on file    Attends religious service: Not on file    Active member of club or organization: Not on file    Attends meetings of clubs or organizations: Not on file    Relationship status: Not on file  Other Topics Concern  . Not on file  Social History Narrative   Occupation: grocery clerk   Second marriage - 3 children from first marriage   Tobacco Use - Yes.     Alcohol Use - yes(rare)             Family History: The patient's family history includes Aneurysm in his father; Cancer in his maternal uncle; Coronary artery disease in his mother; Hyperlipidemia in his father and unknown relative; Hypertension  in his mother and unknown relative; Stroke in his unknown relative. There is no history of Colon cancer, Esophageal cancer, Liver cancer, Pancreatic cancer, Rectal cancer, or Stomach cancer. ROS:   Please see the history of present illness.    All 14  point review of systems negative except as described per history of present illness  EKGs/Labs/Other Studies Reviewed:      Recent Labs: 04/24/2018: ALT 11; BUN 17; Creatinine, Ser 1.06; Hemoglobin 11.5; Platelets 394.0; Potassium 4.5; Sodium 141; TSH 2.43  Recent Lipid Panel    Component Value Date/Time   CHOL 111 04/24/2018 0906   TRIG 146.0 04/24/2018 0906   HDL 24.40 (L) 04/24/2018 0906   CHOLHDL 5 04/24/2018 0906   VLDL 29.2 04/24/2018 0906   LDLCALC 58 04/24/2018 0906    Physical Exam:    VS:  BP 118/70   Pulse 66   Ht 5\' 11"  (1.803 m)   Wt 216 lb (98 kg)   SpO2 98%   BMI 30.13 kg/m     Wt Readings from Last 3 Encounters:  06/01/18 216 lb (98 kg)  05/22/18 217 lb (98.4 kg)  05/11/18 217 lb (98.4 kg)     GEN:  Well nourished, well developed in no acute distress HEENT: Normal NECK: No JVD; No carotid bruits LYMPHATICS: No lymphadenopathy CARDIAC: RRR, no murmurs, no rubs, no gallops RESPIRATORY:  Clear to auscultation without rales, wheezing or rhonchi  ABDOMEN: Soft, non-tender, non-distended MUSCULOSKELETAL:  No edema; No deformity  SKIN: Warm and dry LOWER EXTREMITIES: no swelling NEUROLOGIC:  Alert and oriented x 3 PSYCHIATRIC:  Normal affect   ASSESSMENT:    1. Coronary artery disease of native artery of native heart with stable angina pectoris ([place])   2. Essential hypertension   3. PERIPHERAL VASCULAR DISEASE   4. Diabetes mellitus type 2 in obese ([place])   5. Hyperlipidemia, mixed   6. Status post coronary artery bypass graft   7. Abnormal stress test    PLAN:    In order of problems listed above:  1. Coronary artery disease abnormal stress test indicating ischemia in LAD territory.  Options  discussed as described above.  He agreed to proceed with cardiac catheterization procedure was explained to him including all risk benefits as well as alternatives.  Will proceed. 2. Essential hypertension blood pressure well controlled continue present management. 3. Peripheral vascular disease abdominal aortic ultrasounds as well as ultrasounds of lower extremities reviewed with the patient no critical lesion however multiple left lesions at multiple levels with 50 up to 75% stenosis. 4. Dyslipidemia last time I seen him by a increased dose of statin to reach high intensity statin.  Will recheck fasting lipid profile as well as AST ALT. 5. Status post coronary artery bypass graft done more than 15 years ago.  No intervention since that time required.   Medication Adjustments/Labs and Tests Ordered: Current medicines are reviewed at length with the patient today.  Concerns regarding medicines are outlined above.  No orders of the defined types were placed in this encounter.  Medication changes: No orders of the defined types were placed in this encounter.   Signed, Park Liter, MD, [facility] 06/01/2018 11:20 AM    [place]

## 2018-06-02 LAB — BASIC METABOLIC PANEL
BUN/Creatinine Ratio: 15 (ref 10–24)
BUN: 18 mg/dL (ref 8–27)
CO2: 25 mmol/L (ref 20–29)
Calcium: 9.9 mg/dL (ref 8.6–10.2)
Chloride: 97 mmol/L (ref 96–106)
Creatinine, Ser: 1.17 mg/dL (ref 0.76–1.27)
GFR calc Af Amer: 76 mL/min/{1.73_m2} (ref >59)
GFR calc non Af Amer: 66 mL/min/{1.73_m2} (ref >59)
Glucose: 189 mg/dL — ABNORMAL HIGH (ref 65–99)
Potassium: 5.6 mmol/L — ABNORMAL HIGH (ref 3.5–5.2)
Sodium: 138 mmol/L (ref 134–144)

## 2018-06-02 LAB — CBC
Hematocrit: 35.2 % — ABNORMAL LOW (ref 37.5–51.0)
Hemoglobin: 11.4 g/dL — ABNORMAL LOW (ref 13.0–17.7)
MCH: 28 pg (ref 26.6–33.0)
MCHC: 32.4 g/dL (ref 31.5–35.7)
MCV: 87 fL (ref 79–97)
Platelets: 394 10*3/uL (ref 150–450)
RBC: 4.07 x10E6/uL — ABNORMAL LOW (ref 4.14–5.80)
RDW: 14.2 % (ref 12.3–15.4)
WBC: 7.4 10*3/uL (ref 3.4–10.8)

## 2018-06-02 LAB — AST: AST: 14 IU/L (ref 0–40)

## 2018-06-02 LAB — ALT: ALT: 12 IU/L (ref 0–44)

## 2018-06-06 ENCOUNTER — Encounter (INDEPENDENT_AMBULATORY_CARE_PROVIDER_SITE_OTHER): Payer: Self-pay

## 2018-06-06 ENCOUNTER — Telehealth: Payer: Self-pay | Admitting: *Deleted

## 2018-06-06 DIAGNOSIS — I10 Essential (primary) hypertension: Secondary | ICD-10-CM

## 2018-06-06 MED ORDER — LISINOPRIL 20 MG PO TABS: 20.0000 mg | ORAL_TABLET | Freq: Every day | ORAL | 3 refills | Status: DC

## 2018-06-06 NOTE — Telephone Encounter (Signed)
-----   Message from Park Liter, MD sent at 06/06/2018  9:29 AM EDT ----- Potassium elevated,  Cut lisinopril in a half chem7 in 1 week

## 2018-06-06 NOTE — Telephone Encounter (Signed)
Patient informed of lab results and advised to decrease lisinopril from 40 mg to 20 mg daily. New prescription sent.  Informed patient to return to our [facility] office in 1 week for repeat lab work, no appointment needed. Patient verbalized understanding. No further questions.

## 2018-06-07 ENCOUNTER — Telehealth: Payer: Self-pay | Admitting: *Deleted

## 2018-06-07 NOTE — Telephone Encounter (Addendum)
Catheterization scheduled at [facility] for: Thursday June 08, 2018 10:30 AM Verify arrival time and place: [place] Entrance A at: 8 AM  No solid food after midnight prior to cath, clear liquids until 5 AM day of procedure.   Hold: Metformin day of procedure and 48 hours post procedure. Glimepiride AM of procedure Insulin AM of procedure. 1/2 Insulin PM prior to procedure.   Except hold medications AM meds can be  taken pre-cath with sip of water including: ASA 81 mg  Confirm patient has responsible person to drive home post procedure and for 24 hours after you arrive home.  LMTCB to discuss instructions with patient.  Left detailed message (DPR) at [phone] with instructions

## 2018-06-08 ENCOUNTER — Other Ambulatory Visit: Payer: Self-pay

## 2018-06-08 ENCOUNTER — Encounter (HOSPITAL_COMMUNITY): Payer: Self-pay | Admitting: *Deleted

## 2018-06-08 ENCOUNTER — Encounter (HOSPITAL_COMMUNITY)
Admission: RE | Disposition: A | Discharge status: Home / Self Care | Payer: Self-pay | Source: Ambulatory Visit | Attending: Interventional Cardiology

## 2018-06-08 ENCOUNTER — Ambulatory Visit (HOSPITAL_COMMUNITY)
Admission: RE | Admit: 2018-06-08 | Discharge: 2018-06-08 | Disposition: A | Discharge status: Home / Self Care | Payer: Federal, State, Local not specified - PPO | Source: Ambulatory Visit | Attending: Interventional Cardiology | Admitting: Interventional Cardiology

## 2018-06-08 DIAGNOSIS — E1140 Type 2 diabetes mellitus with diabetic neuropathy, unspecified: Secondary | ICD-10-CM | POA: Diagnosis not present

## 2018-06-08 DIAGNOSIS — E669 Obesity, unspecified: Secondary | ICD-10-CM | POA: Diagnosis present

## 2018-06-08 DIAGNOSIS — I10 Essential (primary) hypertension: Secondary | ICD-10-CM | POA: Insufficient documentation

## 2018-06-08 DIAGNOSIS — Z86010 Personal history of colonic polyps: Secondary | ICD-10-CM | POA: Diagnosis not present

## 2018-06-08 DIAGNOSIS — E782 Mixed hyperlipidemia: Secondary | ICD-10-CM | POA: Diagnosis present

## 2018-06-08 DIAGNOSIS — I252 Old myocardial infarction: Secondary | ICD-10-CM | POA: Insufficient documentation

## 2018-06-08 DIAGNOSIS — Z8249 Family history of ischemic heart disease and other diseases of the circulatory system: Secondary | ICD-10-CM | POA: Insufficient documentation

## 2018-06-08 DIAGNOSIS — I25810 Atherosclerosis of coronary artery bypass graft(s) without angina pectoris: Secondary | ICD-10-CM | POA: Insufficient documentation

## 2018-06-08 DIAGNOSIS — Z6830 Body mass index (BMI) 30.0-30.9, adult: Secondary | ICD-10-CM | POA: Insufficient documentation

## 2018-06-08 DIAGNOSIS — Z9889 Other specified postprocedural states: Secondary | ICD-10-CM | POA: Insufficient documentation

## 2018-06-08 DIAGNOSIS — G4700 Insomnia, unspecified: Secondary | ICD-10-CM | POA: Diagnosis not present

## 2018-06-08 DIAGNOSIS — I209 Angina pectoris, unspecified: Secondary | ICD-10-CM

## 2018-06-08 DIAGNOSIS — Z823 Family history of stroke: Secondary | ICD-10-CM | POA: Insufficient documentation

## 2018-06-08 DIAGNOSIS — Z794 Long term (current) use of insulin: Secondary | ICD-10-CM | POA: Insufficient documentation

## 2018-06-08 DIAGNOSIS — E1151 Type 2 diabetes mellitus with diabetic peripheral angiopathy without gangrene: Secondary | ICD-10-CM | POA: Diagnosis not present

## 2018-06-08 DIAGNOSIS — I2510 Atherosclerotic heart disease of native coronary artery without angina pectoris: Secondary | ICD-10-CM | POA: Diagnosis present

## 2018-06-08 DIAGNOSIS — I25708 Atherosclerosis of coronary artery bypass graft(s), unspecified, with other forms of angina pectoris: Secondary | ICD-10-CM

## 2018-06-08 DIAGNOSIS — E1169 Type 2 diabetes mellitus with other specified complication: Secondary | ICD-10-CM | POA: Diagnosis not present

## 2018-06-08 DIAGNOSIS — K219 Gastro-esophageal reflux disease without esophagitis: Secondary | ICD-10-CM | POA: Insufficient documentation

## 2018-06-08 DIAGNOSIS — Z951 Presence of aortocoronary bypass graft: Secondary | ICD-10-CM

## 2018-06-08 DIAGNOSIS — R9439 Abnormal result of other cardiovascular function study: Secondary | ICD-10-CM | POA: Diagnosis not present

## 2018-06-08 DIAGNOSIS — Z79899 Other long term (current) drug therapy: Secondary | ICD-10-CM | POA: Diagnosis not present

## 2018-06-08 DIAGNOSIS — Z87891 Personal history of nicotine dependence: Secondary | ICD-10-CM

## 2018-06-08 HISTORY — PX: LEFT HEART CATH AND CORS/GRAFTS ANGIOGRAPHY: CATH118250

## 2018-06-08 LAB — GLUCOSE, CAPILLARY
Glucose-Capillary: 148 mg/dL — ABNORMAL HIGH (ref 70–99)
Glucose-Capillary: 121 mg/dL — ABNORMAL HIGH (ref 70–99)

## 2018-06-08 LAB — BASIC METABOLIC PANEL
Anion gap: 14 (ref 5–15)
BUN: 19 mg/dL (ref 8–23)
CO2: 25 mmol/L (ref 22–32)
Calcium: 9.5 mg/dL (ref 8.9–10.3)
Chloride: 103 mmol/L (ref 98–111)
Creatinine, Ser: 1.1 mg/dL (ref 0.61–1.24)
GFR calc Af Amer: >60 mL/min (ref >60)
GFR calc non Af Amer: >60 mL/min (ref >60)
Glucose, Bld: 129 mg/dL — ABNORMAL HIGH (ref 70–99)
Potassium: 4.5 mmol/L (ref 3.5–5.1)
Sodium: 142 mmol/L (ref 135–145)

## 2018-06-08 SURGERY — LEFT HEART CATH AND CORS/GRAFTS ANGIOGRAPHY
Anesthesia: LOCAL

## 2018-06-08 MED ORDER — SODIUM CHLORIDE 0.9% FLUSH: 3.0000 mL | Freq: Two times a day (BID) | INTRAVENOUS | Status: DC

## 2018-06-08 MED ORDER — LIDOCAINE HCL (PF) 1 % IJ SOLN
INTRAMUSCULAR | Status: AC
  Filled 2018-06-08: qty 30

## 2018-06-08 MED ORDER — SODIUM CHLORIDE 0.9 % WEIGHT BASED INFUSION
3.0000 mL/kg/h | INTRAVENOUS | Status: DC
  Administered 2018-06-08: 3 mL/kg/h via INTRAVENOUS

## 2018-06-08 MED ORDER — HEPARIN (PORCINE) IN NACL 1000-0.9 UT/500ML-% IV SOLN
INTRAVENOUS | Status: DC | PRN
  Administered 2018-06-08: 500 mL

## 2018-06-08 MED ORDER — SODIUM CHLORIDE 0.9 % IV SOLN: 250.0000 mL | INTRAVENOUS | Status: DC | PRN

## 2018-06-08 MED ORDER — SODIUM CHLORIDE 0.9% FLUSH: 3.0000 mL | INTRAVENOUS | Status: DC | PRN

## 2018-06-08 MED ORDER — SODIUM CHLORIDE 0.9 % WEIGHT BASED INFUSION: 1.0000 mL/kg/h | INTRAVENOUS | Status: DC

## 2018-06-08 MED ORDER — MIDAZOLAM HCL 2 MG/2ML IJ SOLN
INTRAMUSCULAR | Status: AC
  Filled 2018-06-08: qty 2

## 2018-06-08 MED ORDER — HEPARIN (PORCINE) IN NACL 1000-0.9 UT/500ML-% IV SOLN
INTRAVENOUS | Status: AC
  Filled 2018-06-08: qty 1000

## 2018-06-08 MED ORDER — ONDANSETRON HCL 4 MG/2ML IJ SOLN
4.0000 mg | Freq: Four times a day (QID) | INTRAMUSCULAR | Status: DC | PRN
Start: 2018-06-08 — End: 2018-06-08

## 2018-06-08 MED ORDER — LIDOCAINE HCL (PF) 1 % IJ SOLN
INTRAMUSCULAR | Status: DC | PRN
  Administered 2018-06-08: 2 mL

## 2018-06-08 MED ORDER — SODIUM CHLORIDE 0.9 % IV SOLN: INTRAVENOUS | Status: DC

## 2018-06-08 MED ORDER — HEPARIN SODIUM (PORCINE) 1000 UNIT/ML IJ SOLN
INTRAMUSCULAR | Status: DC | PRN
  Administered 2018-06-08: 5000 [IU] via INTRAVENOUS

## 2018-06-08 MED ORDER — IOPAMIDOL (ISOVUE-370) INJECTION 76%
INTRAVENOUS | Status: DC | PRN
  Administered 2018-06-08: 195 mL via INTRA_ARTERIAL

## 2018-06-08 MED ORDER — ACETAMINOPHEN 325 MG PO TABS: 650.0000 mg | ORAL_TABLET | ORAL | Status: DC | PRN

## 2018-06-08 MED ORDER — FENTANYL CITRATE (PF) 100 MCG/2ML IJ SOLN
INTRAMUSCULAR | Status: DC | PRN
  Administered 2018-06-08: 50 ug via INTRAVENOUS

## 2018-06-08 MED ORDER — MIDAZOLAM HCL 2 MG/2ML IJ SOLN
INTRAMUSCULAR | Status: DC | PRN
  Administered 2018-06-08: 1 mg via INTRAVENOUS

## 2018-06-08 MED ORDER — IOPAMIDOL (ISOVUE-370) INJECTION 76%
INTRAVENOUS | Status: AC
  Filled 2018-06-08: qty 100

## 2018-06-08 MED ORDER — VERAPAMIL HCL 2.5 MG/ML IV SOLN
INTRAVENOUS | Status: AC
  Filled 2018-06-08: qty 2

## 2018-06-08 MED ORDER — IOPAMIDOL (ISOVUE-370) INJECTION 76%
INTRAVENOUS | Status: AC
  Filled 2018-06-08: qty 125

## 2018-06-08 MED ORDER — ASPIRIN 81 MG PO CHEW: 81.0000 mg | CHEWABLE_TABLET | ORAL | Status: DC

## 2018-06-08 MED ORDER — HEPARIN (PORCINE) IN NACL 2-0.9 UNITS/ML
INTRAMUSCULAR | Status: DC | PRN
  Administered 2018-06-08: 10:00:00 via INTRA_ARTERIAL

## 2018-06-08 MED ORDER — FENTANYL CITRATE (PF) 100 MCG/2ML IJ SOLN
INTRAMUSCULAR | Status: AC
  Filled 2018-06-08: qty 2

## 2018-06-08 MED ORDER — HEPARIN SODIUM (PORCINE) 1000 UNIT/ML IJ SOLN
INTRAMUSCULAR | Status: AC
  Filled 2018-06-08: qty 1

## 2018-06-08 SURGICAL SUPPLY — 13 items
CATH INFINITI 5FR AL1 (CATHETERS) ×2 IMPLANT
CATH INFINITI 5FR MPB2 (CATHETERS) ×2 IMPLANT
CATH INFINITI 6F ANG MULTIPACK (CATHETERS) ×2 IMPLANT
DEVICE RAD COMP TR BAND LRG (VASCULAR PRODUCTS) ×2 IMPLANT
GLIDESHEATH SLEND A-KIT 6F 22G (SHEATH) ×2 IMPLANT
GUIDEWIRE INQWIRE 1.5J.035X260 (WIRE) ×1 IMPLANT
INQWIRE 1.5J .035X260CM (WIRE) ×2
KIT HEART LEFT (KITS) ×2 IMPLANT
PACK CARDIAC CATHETERIZATION (CUSTOM PROCEDURE TRAY) ×2 IMPLANT
SHEATH PROBE COVER 6X72 (BAG) ×2 IMPLANT
SHIELD RADPAD SCOOP 12X17 (MISCELLANEOUS) ×2 IMPLANT
TRANSDUCER W/STOPCOCK (MISCELLANEOUS) ×2 IMPLANT
TUBING CIL FLEX 10 FLL-RA (TUBING) ×2 IMPLANT

## 2018-06-08 NOTE — Interval H&P Note (Signed)
Cath Lab Visit (complete for each Cath Lab visit)  Clinical Evaluation Leading to the Procedure:   ACS: No.  Non-ACS:    Anginal Classification: CCS [name]  Anti-ischemic medical therapy: Maximal Therapy (2 or more classes of medications)  Non-Invasive Test Results: Intermediate-risk stress test findings: cardiac mortality 1-3%/year  Prior CABG: Previous CABG      History and Physical Interval Note:  06/08/2018 11:42 AM  Gene [name]  has presented today for surgery, with the diagnosis of abnormal stress test  The various methods of treatment have been discussed with the patient and family. After consideration of risks, benefits and other options for treatment, the patient has consented to  Procedure(s): LEFT HEART CATH AND CORS/GRAFTS ANGIOGRAPHY (N/A) as a surgical intervention .  The patient's history has been reviewed, patient examined, no change in status, stable for surgery.  I have reviewed the patient's chart and labs.  Questions were answered to the patient's satisfaction.     Gene [name]

## 2018-06-08 NOTE — Research (Signed)
CADFEM Informed Consent   Subject Name: Gene [name]  Subject met inclusion and exclusion criteria.  The informed consent form, study requirements and expectations were reviewed with the subject and questions and concerns were addressed prior to the signing of the consent form.  The subject verbalized understanding of the trail requirements.  The subject agreed to participate in the CADFEM trial and signed the informed consent.  The informed consent was obtained prior to performance of any protocol-specific procedures for the subject.  A copy of the signed informed consent was given to the subject and a copy was placed in the subject's medical record.  [name] 06/08/2018, 9:22 AM

## 2018-06-08 NOTE — Discharge Instructions (Signed)

## 2018-06-09 ENCOUNTER — Ambulatory Visit: Payer: Federal, State, Local not specified - PPO | Admitting: Cardiology

## 2018-06-09 ENCOUNTER — Telehealth: Payer: Self-pay | Admitting: *Deleted

## 2018-06-09 NOTE — Telephone Encounter (Signed)
Patient informed that his potassium level is now within normal range and Dr. Agustin Cree advised that he continue lisinopril 20 mg daily. Patient's follow up appointment post heart catheterization moved from 07/11/18 to 06/16/18 at 1:20 pm. No further questions.

## 2018-06-16 ENCOUNTER — Ambulatory Visit: Payer: Federal, State, Local not specified - PPO | Admitting: Cardiology

## 2018-06-16 ENCOUNTER — Encounter: Payer: Self-pay | Admitting: Cardiology

## 2018-06-16 VITALS — BP 134/80 | HR 71 | Ht 71.0 in | Wt 216.0 lb

## 2018-06-16 DIAGNOSIS — Z951 Presence of aortocoronary bypass graft: Secondary | ICD-10-CM | POA: Diagnosis not present

## 2018-06-16 DIAGNOSIS — I10 Essential (primary) hypertension: Secondary | ICD-10-CM

## 2018-06-16 DIAGNOSIS — I25118 Atherosclerotic heart disease of native coronary artery with other forms of angina pectoris: Secondary | ICD-10-CM | POA: Diagnosis not present

## 2018-06-16 DIAGNOSIS — I209 Angina pectoris, unspecified: Secondary | ICD-10-CM

## 2018-06-16 NOTE — Patient Instructions (Signed)
Medication Instructions:  Your physician recommends that you continue on your current medications as directed. Please refer to the Current Medication list given to you today.   Labwork: None  Testing/Procedures: None  Follow-Up: Your physician wants you to follow-up in: 3 months. You will receive a reminder letter in the mail two months in advance. If you don't receive a letter, please call our office to schedule the follow-up appointment.   If you need a refill on your cardiac medications before your next appointment, please call your pharmacy.   Thank you for choosing [facility]! Robyne Peers, RN [phone]

## 2018-06-16 NOTE — Progress Notes (Signed)
Cardiology Office Note:    Date:  06/16/2018   ID:  [name], DOB 08-04-1956, MRN [id number]  PCP:  Mosie Lukes, MD  Cardiologist:  Jenne Campus, MD    Referring MD: Mosie Lukes, MD   Chief Complaint  Patient presents with  . Follow-up    1 month follow up from heart cath  Doing well  History of Present Illness:    Gene [name] is a 62 y.o. male coronary artery disease status post coronary bypass graft recent cardiac catheterization showing significant disease but no target lesions for intervention the key is to maximize his medical therapy and he said he feels good with ranolazine we will continue this medication the key also will be to advise his risk factors the best we can.  Past Medical History:  Diagnosis Date  . Anemia   . CAD (coronary artery disease)   . Chronic pain in right foot   . Diabetes ([place])   . Diabetes mellitus type II   . Diabetic neuropathy ([place])   . GERD (gastroesophageal reflux disease)   . H/O tobacco use, presenting hazards to health 05/21/2009   Qualifier: Diagnosis of  By: Wynona Luna  Last cigarette smoked May 30, 2015    . History of colonic polyps   . Hyperlipidemia   . Hypertension   . Insomnia 03/25/2013  . Myocardial infarction ([place])   . Neuromuscular disorder ([place])    NEUROPATHY  . Obesity 06/24/2016  . Plantar fasciitis   . Preventative health care 05/03/2014  . PVD (peripheral vascular disease) ([place])    PTA RLE 12/08 [place]  . TOBACCO USER 05/21/2009   Qualifier: Diagnosis of  By: Wynona Luna  Last cigarette smoked May 30, 2015      Past Surgical History:  Procedure Laterality Date  . CORONARY ARTERY BYPASS GRAFT     200 SVG to diagonal, LIMA to LAD, SVG to LCX  . LEFT HEART CATH AND CORS/GRAFTS ANGIOGRAPHY N/A 06/08/2018   Procedure: LEFT HEART CATH AND CORS/GRAFTS ANGIOGRAPHY;  Surgeon: Belva Crome, MD;  Location: [facility];  Service: Cardiovascular;  Laterality: N/A;  . OTHER  SURGICAL HISTORY     PAD surgery  . REVISION TOTAL KNEE ARTHROPLASTY      Current Medications: Current Meds  Medication Sig  . amLODipine (NORVASC) 10 MG tablet Take 1 tablet (10 mg total) by mouth daily.  Marland Kitchen aspirin EC 81 MG tablet Take 1 tablet (81 mg total) by mouth daily.  Marland Kitchen atorvastatin (LIPITOR) 40 MG tablet Take 1 tablet (40 mg total) by mouth daily.  . Cyanocobalamin (B-12) 100 MCG TABS Take 100 mcg by mouth daily.   . Ferrous Fumarate (HEMOCYTE) 324 (106 Fe) MG TABS tablet Take 1 tablet (106 mg of iron total) by mouth daily.  . fish oil-omega-3 fatty acids 1000 MG capsule Take 1 g by mouth at bedtime.    . gabapentin (NEURONTIN) 600 MG tablet 1 tab po bid and a third tab daily as needed for severe (Patient taking differently: Take 600-1,200 mg by mouth See admin instructions. Take 1200 mg by mouth in the morning and take 600 mg by mouth at bedtime)  . glimepiride (AMARYL) 4 MG tablet TAKE 1 TABLET BY MOUTH TWICE DAILY  . Insulin Pen Needle (RELION PEN NEEDLES) 29G X 12MM MISC USE  ONCE DAILY IN THE EVENING  . labetalol (NORMODYNE) 200 MG tablet Take 1 tablet (200 mg total) by  mouth 2 (two) times daily.  Marland Kitchen LANTUS SOLOSTAR 100 UNIT/ML Solostar Pen INJECT 32 UNITS SUBCUTANEOUSLY ONCE DAILY AT 10 PM. (Patient taking differently: INJECT 30-35 UNITS SUBCUTANEOUSLY ONCE DAILY AT 10 PM PER SLIDING SCALE)  . lisinopril (PRINIVIL,ZESTRIL) 20 MG tablet Take 1 tablet (20 mg total) by mouth daily.  . metFORMIN (GLUCOPHAGE-XR) 500 MG 24 hr tablet TAKE 2 TABLETS BY MOUTH TWICE DAILY  . Multiple Vitamins-Minerals (CENTRUM PO) Take 1 tablet by mouth daily.   . nitroGLYCERIN (NITROSTAT) 0.4 MG SL tablet Place 0.4 mg under the tongue every 5 (five) minutes as needed for chest pain.   Marland Kitchen omeprazole (PRILOSEC) 40 MG capsule Take 1 capsule (40 mg total) by mouth daily.  . ranolazine (RANEXA) 500 MG 12 hr tablet Take 1 tablet (500 mg total) by mouth 2 (two) times daily.  Marland Kitchen Specialty Vitamins Products  (MAGNESIUM, AMINO ACID CHELATE,) 133 MG tablet Take 1 tablet by mouth daily.     Allergies:   Patient has no known allergies.   Social History   Socioeconomic History  . Marital status: Married    Spouse name: Not on file  . Number of children: Not on file  . Years of education: Not on file  . Highest education level: Not on file  Occupational History  . Not on file  Social Needs  . Financial resource strain: Not on file  . Food insecurity:    Worry: Not on file    Inability: Not on file  . Transportation needs:    Medical: Not on file    Non-medical: Not on file  Tobacco Use  . Smoking status: Former Smoker    Packs/day: 0.50    Years: 40.00    Pack years: 20.00    Types: Cigarettes    Last attempt to quit: 05/04/2015    Years since quitting: 3.1  . Smokeless tobacco: Never Used  Substance and Sexual Activity  . Alcohol use: No    Alcohol/week: 0.0 oz  . Drug use: No    Comment: rare  . Sexual activity: Yes    Comment: lives with wife, retired   Lifestyle  . Physical activity:    Days per week: Not on file    Minutes per session: Not on file  . Stress: Not on file  Relationships  . Social connections:    Talks on phone: Not on file    Gets together: Not on file    Attends religious service: Not on file    Active member of club or organization: Not on file    Attends meetings of clubs or organizations: Not on file    Relationship status: Not on file  Other Topics Concern  . Not on file  Social History Narrative   Occupation: grocery clerk   Second marriage - 3 children from first marriage   Tobacco Use - Yes.     Alcohol Use - yes(rare)             Family History: The patient's family history includes Aneurysm in his father; Cancer in his maternal uncle; Coronary artery disease in his mother; Hyperlipidemia in his father and unknown relative; Hypertension in his mother and unknown relative; Stroke in his unknown relative. There is no history of Colon  cancer, Esophageal cancer, Liver cancer, Pancreatic cancer, Rectal cancer, or Stomach cancer. ROS:   Please see the history of present illness.    All 14 point review of systems negative except as described per history of present  illness  EKGs/Labs/Other Studies Reviewed:      Recent Labs: 04/24/2018: TSH 2.43 06/01/2018: ALT 12; Hemoglobin 11.4; Platelets 394 06/08/2018: BUN 19; Creatinine, Ser 1.10; Potassium 4.5; Sodium 142  Recent Lipid Panel    Component Value Date/Time   CHOL 111 04/24/2018 0906   TRIG 146.0 04/24/2018 0906   HDL 24.40 (L) 04/24/2018 0906   CHOLHDL 5 04/24/2018 0906   VLDL 29.2 04/24/2018 0906   LDLCALC 58 04/24/2018 0906    Physical Exam:    VS:  BP 134/80 (BP Location: Left Arm)   Pulse 71   Ht 5\' 11"  (1.803 m)   Wt 216 lb (98 kg)   SpO2 97%   BMI 30.13 kg/m     Wt Readings from Last 3 Encounters:  06/16/18 216 lb (98 kg)  06/08/18 216 lb (98 kg)  06/01/18 216 lb (98 kg)     GEN:  Well nourished, well developed in no acute distress HEENT: Normal NECK: No JVD; No carotid bruits LYMPHATICS: No lymphadenopathy CARDIAC: RRR, no murmurs, no rubs, no gallops RESPIRATORY:  Clear to auscultation without rales, wheezing or rhonchi  ABDOMEN: Soft, non-tender, non-distended MUSCULOSKELETAL:  No edema; No deformity  SKIN: Warm and dry LOWER EXTREMITIES: no swelling NEUROLOGIC:  Alert and oriented x 3 PSYCHIATRIC:  Normal affect   ASSESSMENT:    1. Angina pectoris ([place])   2. Coronary artery disease of native artery of native heart with stable angina pectoris ([place])   3. Essential hypertension   4. Status post coronary artery bypass graft    PLAN:    In order of problems listed above:  1. Angina pectoris: Denies having any now on ranolazine which I will continue. 2. Coronary artery disease doing well from that point review asymptomatic.  Last cardiac catheterization revealed no target lesion for intervention 3. Essential hypertension: Doing  well from that point review we will continue present management 4. Status post coronary artery bypass graft: Noted we will continue present management  With talking length about risk factors modification good control of his blood pressure diabetes exercises on the regular basis that he will try to do. See him back in my office 3 months sooner if he get a problem   Medication Adjustments/Labs and Tests Ordered: Current medicines are reviewed at length with the patient today.  Concerns regarding medicines are outlined above.  No orders of the defined types were placed in this encounter.  Medication changes: No orders of the defined types were placed in this encounter.   Signed, Park Liter, MD, [facility] 06/16/2018 1:52 PM    [place]

## 2018-06-22 ENCOUNTER — Encounter: Payer: Self-pay | Admitting: Gastroenterology

## 2018-07-03 ENCOUNTER — Other Ambulatory Visit: Payer: Self-pay | Admitting: *Deleted

## 2018-07-03 MED ORDER — RANOLAZINE ER 500 MG PO TB12: 500.0000 mg | ORAL_TABLET | Freq: Two times a day (BID) | ORAL | 3 refills | Status: DC

## 2018-07-11 ENCOUNTER — Ambulatory Visit: Payer: Federal, State, Local not specified - PPO | Admitting: Cardiology

## 2018-08-01 ENCOUNTER — Other Ambulatory Visit (INDEPENDENT_AMBULATORY_CARE_PROVIDER_SITE_OTHER): Payer: Federal, State, Local not specified - PPO

## 2018-08-01 DIAGNOSIS — E1169 Type 2 diabetes mellitus with other specified complication: Secondary | ICD-10-CM | POA: Diagnosis not present

## 2018-08-01 DIAGNOSIS — I10 Essential (primary) hypertension: Secondary | ICD-10-CM

## 2018-08-01 DIAGNOSIS — E669 Obesity, unspecified: Secondary | ICD-10-CM

## 2018-08-01 DIAGNOSIS — E782 Mixed hyperlipidemia: Secondary | ICD-10-CM

## 2018-08-01 LAB — LIPID PANEL
Cholesterol: 98 mg/dL (ref 0–200)
HDL: 26 mg/dL — ABNORMAL LOW (ref >39.00)
LDL Cholesterol: 48 mg/dL (ref 0–99)
NonHDL: 72
Total CHOL/HDL Ratio: 4
Triglycerides: 118 mg/dL (ref 0.0–149.0)
VLDL: 23.6 mg/dL (ref 0.0–40.0)

## 2018-08-01 LAB — HEMOGLOBIN A1C: Hgb A1c MFr Bld: 8.1 % — ABNORMAL HIGH (ref 4.6–6.5)

## 2018-08-01 LAB — COMPREHENSIVE METABOLIC PANEL
ALT: 12 U/L (ref 0–53)
AST: 16 U/L (ref 0–37)
Albumin: 4.2 g/dL (ref 3.5–5.2)
Alkaline Phosphatase: 87 U/L (ref 39–117)
BUN: 20 mg/dL (ref 6–23)
CO2: 28 mEq/L (ref 19–32)
Calcium: 9.4 mg/dL (ref 8.4–10.5)
Chloride: 102 mEq/L (ref 96–112)
Creatinine, Ser: 0.96 mg/dL (ref 0.40–1.50)
GFR: 83.94 mL/min (ref >60.00)
Glucose, Bld: 63 mg/dL — ABNORMAL LOW (ref 70–99)
Potassium: 4.8 mEq/L (ref 3.5–5.1)
Sodium: 140 mEq/L (ref 135–145)
Total Bilirubin: 0.5 mg/dL (ref 0.2–1.2)
Total Protein: 7 g/dL (ref 6.0–8.3)

## 2018-08-01 LAB — CBC
HCT: 35.6 % — ABNORMAL LOW (ref 39.0–52.0)
Hemoglobin: 11.8 g/dL — ABNORMAL LOW (ref 13.0–17.0)
MCHC: 33.1 g/dL (ref 30.0–36.0)
MCV: 87.8 fl (ref 78.0–100.0)
Platelets: 343 10*3/uL (ref 150.0–400.0)
RBC: 4.05 Mil/uL — ABNORMAL LOW (ref 4.22–5.81)
RDW: 15.4 % (ref 11.5–15.5)
WBC: 7.1 10*3/uL (ref 4.0–10.5)

## 2018-08-01 LAB — TSH: TSH: 1.89 u[IU]/mL (ref 0.35–4.50)

## 2018-08-03 ENCOUNTER — Ambulatory Visit: Payer: Federal, State, Local not specified - PPO | Admitting: Family Medicine

## 2018-08-03 VITALS — BP 126/66 | HR 76 | Temp 97.7°F | Resp 18 | Wt 215.4 lb

## 2018-08-03 DIAGNOSIS — E1169 Type 2 diabetes mellitus with other specified complication: Secondary | ICD-10-CM

## 2018-08-03 DIAGNOSIS — E782 Mixed hyperlipidemia: Secondary | ICD-10-CM

## 2018-08-03 DIAGNOSIS — Z23 Encounter for immunization: Secondary | ICD-10-CM | POA: Diagnosis not present

## 2018-08-03 DIAGNOSIS — D649 Anemia, unspecified: Secondary | ICD-10-CM | POA: Diagnosis not present

## 2018-08-03 DIAGNOSIS — I10 Essential (primary) hypertension: Secondary | ICD-10-CM

## 2018-08-03 DIAGNOSIS — E669 Obesity, unspecified: Secondary | ICD-10-CM

## 2018-08-03 NOTE — Assessment & Plan Note (Signed)
hgba1c acceptable, minimize simple carbs. Increase exercise as tolerated. Cont current meds 

## 2018-08-03 NOTE — Assessment & Plan Note (Signed)
Increase leafy greens, consider increased lean red meat and using cast iron cookware. Continue to monitor, report any concerns 

## 2018-08-03 NOTE — Assessment & Plan Note (Signed)
Well controlled, no changes to meds. Encouraged heart healthy diet such as the DASH diet and exercise as tolerated.  °

## 2018-08-03 NOTE — Patient Instructions (Signed)

## 2018-08-03 NOTE — Assessment & Plan Note (Signed)
Encouraged heart healthy diet, increase exercise, avoid trans fats, consider a krill oil cap daily 

## 2018-08-03 NOTE — Progress Notes (Signed)
Subjective:    Patient ID: Gene [name], male    DOB: 05-Feb-1956, 62 y.o.   MRN: [id number]  No chief complaint on file.   HPI Patient is in today for follow up and overall is doing OK. His Gene [name] was very ill and has been diagnosed with Addison's Disease. He acknowledges during the stressful times he was eating very badly and too many carbs. No polyuria or polydipsia. Denies CP/palp/SOB/HA/congestion/fevers/GI or GU c/o. Taking meds as prescribed  Past Medical History:  Diagnosis Date  . Anemia   . CAD (coronary artery disease)   . Chronic pain in right foot   . Diabetes ([place])   . Diabetes mellitus type II   . Diabetic neuropathy ([place])   . GERD (gastroesophageal reflux disease)   . H/O tobacco use, presenting hazards to health 05/21/2009   Qualifier: Diagnosis of  By: Wynona Luna  Last cigarette smoked May 30, 2015    . History of colonic polyps   . Hyperlipidemia   . Hypertension   . Insomnia 03/25/2013  . Myocardial infarction ([place])   . Neuromuscular disorder ([place])    NEUROPATHY  . Obesity 06/24/2016  . Plantar fasciitis   . Preventative health care 05/03/2014  . PVD (peripheral vascular disease) ([place])    PTA RLE 12/08 [place]  . TOBACCO USER 05/21/2009   Qualifier: Diagnosis of  By: Wynona Luna  Last cigarette smoked May 30, 2015      Past Surgical History:  Procedure Laterality Date  . CORONARY ARTERY BYPASS GRAFT     200 SVG to diagonal, LIMA to LAD, SVG to LCX  . LEFT HEART CATH AND CORS/GRAFTS ANGIOGRAPHY N/A 06/08/2018   Procedure: LEFT HEART CATH AND CORS/GRAFTS ANGIOGRAPHY;  Surgeon: Belva Crome, MD;  Location: [facility];  Service: Cardiovascular;  Laterality: N/A;  . OTHER SURGICAL HISTORY     PAD surgery  . REVISION TOTAL KNEE ARTHROPLASTY      Family History  Problem Relation Age of Onset  . Coronary artery disease Mother   . Hypertension Mother   . Hyperlipidemia Father   . Aneurysm Father        brain  .  Hyperlipidemia Unknown   . Hypertension Unknown   . Stroke Unknown   . Cancer Maternal Uncle   . Colon cancer Neg Hx   . Esophageal cancer Neg Hx   . Liver cancer Neg Hx   . Pancreatic cancer Neg Hx   . Rectal cancer Neg Hx   . Stomach cancer Neg Hx     Social History   Socioeconomic History  . Marital status: Married    Spouse name: Not on file  . Number of children: Not on file  . Years of education: Not on file  . Highest education level: Not on file  Occupational History  . Not on file  Social Needs  . Financial resource strain: Not on file  . Food insecurity:    Worry: Not on file    Inability: Not on file  . Transportation needs:    Medical: Not on file    Non-medical: Not on file  Tobacco Use  . Smoking status: Former Smoker    Packs/day: 0.50    Years: 40.00    Pack years: 20.00    Types: Cigarettes    Last attempt to quit: 05/04/2015    Years since quitting: 3.2  . Smokeless tobacco: Never Used  Substance and Sexual  Activity  . Alcohol use: No    Alcohol/week: 0.0 standard drinks  . Drug use: No    Comment: rare  . Sexual activity: Yes    Comment: lives with wife, retired   Lifestyle  . Physical activity:    Days per week: Not on file    Minutes per session: Not on file  . Stress: Not on file  Relationships  . Social connections:    Talks on phone: Not on file    Gets together: Not on file    Attends religious service: Not on file    Active member of club or organization: Not on file    Attends meetings of clubs or organizations: Not on file    Relationship status: Not on file  . Intimate partner violence:    Fear of current or ex partner: Not on file    Emotionally abused: Not on file    Physically abused: Not on file    Forced sexual activity: Not on file  Other Topics Concern  . Not on file  Social History Narrative   Occupation: grocery clerk   Second marriage - 3 children from first marriage   Tobacco Use - Yes.     Alcohol Use -  yes(rare)            Outpatient Medications Prior to Visit  Medication Sig Dispense Refill  . amLODipine (NORVASC) 10 MG tablet Take 1 tablet (10 mg total) by mouth daily. 90 tablet 1  . aspirin EC 81 MG tablet Take 1 tablet (81 mg total) by mouth daily. 90 tablet 3  . atorvastatin (LIPITOR) 40 MG tablet Take 1 tablet (40 mg total) by mouth daily. 90 tablet 3  . Cyanocobalamin (B-12) 100 MCG TABS Take 100 mcg by mouth daily.     . Ferrous Fumarate (HEMOCYTE) 324 (106 Fe) MG TABS tablet Take 1 tablet (106 mg of iron total) by mouth daily. 30 tablet 3  . fish oil-omega-3 fatty acids 1000 MG capsule Take 1 g by mouth at bedtime.      . gabapentin (NEURONTIN) 600 MG tablet 1 tab po bid and a third tab daily as needed for severe (Patient taking differently: Take 600-1,200 mg by mouth See admin instructions. Take 1200 mg by mouth in the morning and take 600 mg by mouth at bedtime) 270 tablet 1  . glimepiride (AMARYL) 4 MG tablet TAKE 1 TABLET BY MOUTH TWICE DAILY 180 tablet 1  . Insulin Pen Needle (RELION PEN NEEDLES) 29G X 12MM MISC USE  ONCE DAILY IN THE EVENING 100 each 5  . labetalol (NORMODYNE) 200 MG tablet Take 1 tablet (200 mg total) by mouth 2 (two) times daily. 180 tablet 1  . LANTUS SOLOSTAR 100 UNIT/ML Solostar Pen INJECT 32 UNITS SUBCUTANEOUSLY ONCE DAILY AT 10 PM. (Patient taking differently: INJECT 30-35 UNITS SUBCUTANEOUSLY ONCE DAILY AT 10 PM PER SLIDING SCALE) 15 mL 5  . lisinopril (PRINIVIL,ZESTRIL) 20 MG tablet Take 1 tablet (20 mg total) by mouth daily. 30 tablet 3  . metFORMIN (GLUCOPHAGE-XR) 500 MG 24 hr tablet TAKE 2 TABLETS BY MOUTH TWICE DAILY 360 tablet 2  . Multiple Vitamins-Minerals (CENTRUM PO) Take 1 tablet by mouth daily.     . nitroGLYCERIN (NITROSTAT) 0.4 MG SL tablet Place 0.4 mg under the tongue every 5 (five) minutes as needed for chest pain.     Marland Kitchen omeprazole (PRILOSEC) 40 MG capsule Take 1 capsule (40 mg total) by mouth daily. 90 capsule 1  .  ranolazine (RANEXA)  500 MG 12 hr tablet Take 1 tablet (500 mg total) by mouth 2 (two) times daily. 180 tablet 3  . Specialty Vitamins Products (MAGNESIUM, AMINO ACID CHELATE,) 133 MG tablet Take 1 tablet by mouth daily.     No facility-administered medications prior to visit.     No Known Allergies  Review of Systems  Constitutional: Negative for fever and malaise/fatigue.  HENT: Negative for congestion.   Eyes: Negative for blurred vision.  Respiratory: Negative for shortness of breath.   Cardiovascular: Negative for chest pain, palpitations and leg swelling.  Gastrointestinal: Negative for abdominal pain, blood in stool and nausea.  Genitourinary: Negative for dysuria and frequency.  Musculoskeletal: Negative for falls.  Skin: Negative for rash.  Neurological: Negative for dizziness, loss of consciousness and headaches.  Endo/Heme/Allergies: Negative for environmental allergies.  Psychiatric/Behavioral: Negative for depression. The patient is not nervous/anxious.        Objective:    Physical Exam  Constitutional: He is oriented to person, place, and time. He appears well-developed and well-nourished. No distress.  HENT:  Head: Normocephalic and atraumatic.  Nose: Nose normal.  Eyes: Right eye exhibits no discharge. Left eye exhibits no discharge.  Neck: Normal range of motion. Neck supple.  Cardiovascular: Normal rate and regular rhythm.  No murmur heard. Pulmonary/Chest: Effort normal and breath sounds normal.  Abdominal: Soft. Bowel sounds are normal. There is no tenderness.  Musculoskeletal: He exhibits no edema.  Neurological: He is alert and oriented to person, place, and time.  Skin: Skin is warm and dry.  Psychiatric: He has a normal mood and affect.  Nursing note and vitals reviewed.   BP 126/66 (BP Location: Left Arm, Patient Position: Sitting, Cuff Size: Normal)   Pulse 76   Temp 97.7 F (36.5 C) (Oral)   Resp 18   Wt 215 lb 6.4 oz (97.7 kg)   SpO2 98%   BMI 30.04 kg/m    Wt Readings from Last 3 Encounters:  08/03/18 215 lb 6.4 oz (97.7 kg)  06/16/18 216 lb (98 kg)  06/08/18 216 lb (98 kg)     Lab Results  Component Value Date   WBC 7.1 08/01/2018   HGB 11.8 (L) 08/01/2018   HCT 35.6 (L) 08/01/2018   PLT 343.0 08/01/2018   GLUCOSE 63 (L) 08/01/2018   CHOL 98 08/01/2018   TRIG 118.0 08/01/2018   HDL 26.00 (L) 08/01/2018   LDLCALC 48 08/01/2018   ALT 12 08/01/2018   AST 16 08/01/2018   NA 140 08/01/2018   K 4.8 08/01/2018   CL 102 08/01/2018   CREATININE 0.96 08/01/2018   BUN 20 08/01/2018   CO2 28 08/01/2018   TSH 1.89 08/01/2018   PSA 6.95 (H) 05/02/2018   INR 1.1 07/31/2008   HGBA1C 8.1 (H) 08/01/2018   MICROALBUR 8.9 (H) 05/02/2018    Lab Results  Component Value Date   TSH 1.89 08/01/2018   Lab Results  Component Value Date   WBC 7.1 08/01/2018   HGB 11.8 (L) 08/01/2018   HCT 35.6 (L) 08/01/2018   MCV 87.8 08/01/2018   PLT 343.0 08/01/2018   Lab Results  Component Value Date   NA 140 08/01/2018   K 4.8 08/01/2018   CO2 28 08/01/2018   GLUCOSE 63 (L) 08/01/2018   BUN 20 08/01/2018   CREATININE 0.96 08/01/2018   BILITOT 0.5 08/01/2018   ALKPHOS 87 08/01/2018   AST 16 08/01/2018   ALT 12 08/01/2018   PROT 7.0 08/01/2018  ALBUMIN 4.2 08/01/2018   CALCIUM 9.4 08/01/2018   ANIONGAP 14 06/08/2018   GFR 83.94 08/01/2018   Lab Results  Component Value Date   CHOL 98 08/01/2018   Lab Results  Component Value Date   HDL 26.00 (L) 08/01/2018   Lab Results  Component Value Date   LDLCALC 48 08/01/2018   Lab Results  Component Value Date   TRIG 118.0 08/01/2018   Lab Results  Component Value Date   CHOLHDL 4 08/01/2018   Lab Results  Component Value Date   HGBA1C 8.1 (H) 08/01/2018       Assessment & Plan:   Problem List Items Addressed This Visit    Diabetes mellitus type 2 in obese ([place])    hgba1c acceptable, minimize simple carbs. Increase exercise as tolerated. Cont current meds      Relevant  Orders   Hemoglobin A1c   Hyperlipidemia, mixed    Encouraged heart healthy diet, increase exercise, avoid trans fats, consider a krill oil cap daily      Relevant Orders   Lipid panel   Essential hypertension    Well controlled, no changes to meds. Encouraged heart healthy diet such as the DASH diet and exercise as tolerated.       Relevant Orders   Comprehensive metabolic panel   TSH   Anemia    Increase leafy greens, consider increased lean red meat and using cast iron cookware. Continue to monitor, report any concerns      Relevant Orders   CBC    Other Visit Diagnoses    Needs flu shot    -  Primary   Relevant Orders   Flu Vaccine QUAD 6+ mos PF IM (Fluarix Quad PF) (Completed)   Need for shingles vaccine       Relevant Orders   Varicella-zoster vaccine IM (Shingrix) (Completed)      I am having [name] maintain his Multiple Vitamins-Minerals (CENTRUM PO), nitroGLYCERIN, fish oil-omega-3 fatty acids, Insulin Pen Needle, metFORMIN, LANTUS SOLOSTAR, glimepiride, labetalol, gabapentin, amLODipine, omeprazole, Ferrous Fumarate, atorvastatin, magnesium (amino acid chelate), B-12, aspirin EC, lisinopril, and ranolazine.  No orders of the defined types were placed in this encounter.    Penni Homans, MD

## 2018-09-16 ENCOUNTER — Other Ambulatory Visit: Payer: Self-pay | Admitting: Cardiology

## 2018-09-18 ENCOUNTER — Telehealth: Payer: Self-pay | Admitting: Cardiology

## 2018-09-18 NOTE — Telephone Encounter (Signed)
done

## 2018-09-22 ENCOUNTER — Ambulatory Visit: Payer: Federal, State, Local not specified - PPO | Admitting: Cardiology

## 2018-10-30 ENCOUNTER — Other Ambulatory Visit: Payer: Self-pay | Admitting: Family Medicine

## 2018-11-02 ENCOUNTER — Ambulatory Visit: Payer: Federal, State, Local not specified - PPO | Admitting: Family Medicine

## 2018-11-28 ENCOUNTER — Other Ambulatory Visit: Payer: Self-pay | Admitting: Family Medicine

## 2018-11-30 ENCOUNTER — Other Ambulatory Visit: Payer: Self-pay | Admitting: Emergency Medicine

## 2018-11-30 MED ORDER — LISINOPRIL 20 MG PO TABS: 20.0000 mg | ORAL_TABLET | Freq: Every day | ORAL | 1 refills | Status: DC

## 2018-12-21 ENCOUNTER — Other Ambulatory Visit (INDEPENDENT_AMBULATORY_CARE_PROVIDER_SITE_OTHER): Payer: Federal, State, Local not specified - PPO

## 2018-12-21 DIAGNOSIS — E782 Mixed hyperlipidemia: Secondary | ICD-10-CM | POA: Diagnosis not present

## 2018-12-21 DIAGNOSIS — E669 Obesity, unspecified: Secondary | ICD-10-CM

## 2018-12-21 DIAGNOSIS — D649 Anemia, unspecified: Secondary | ICD-10-CM | POA: Diagnosis not present

## 2018-12-21 DIAGNOSIS — I10 Essential (primary) hypertension: Secondary | ICD-10-CM | POA: Diagnosis not present

## 2018-12-21 DIAGNOSIS — E1169 Type 2 diabetes mellitus with other specified complication: Secondary | ICD-10-CM

## 2018-12-21 LAB — COMPREHENSIVE METABOLIC PANEL
ALT: 10 U/L (ref 0–53)
AST: 12 U/L (ref 0–37)
Albumin: 4.2 g/dL (ref 3.5–5.2)
Alkaline Phosphatase: 82 U/L (ref 39–117)
BUN: 18 mg/dL (ref 6–23)
CO2: 33 mEq/L — ABNORMAL HIGH (ref 19–32)
Calcium: 9.3 mg/dL (ref 8.4–10.5)
Chloride: 101 mEq/L (ref 96–112)
Creatinine, Ser: 0.88 mg/dL (ref 0.40–1.50)
GFR: 87.21 mL/min (ref >60.00)
Glucose, Bld: 71 mg/dL (ref 70–99)
Potassium: 4.4 mEq/L (ref 3.5–5.1)
Sodium: 141 mEq/L (ref 135–145)
Total Bilirubin: 0.3 mg/dL (ref 0.2–1.2)
Total Protein: 6.6 g/dL (ref 6.0–8.3)

## 2018-12-21 LAB — HEMOGLOBIN A1C: Hgb A1c MFr Bld: 7.7 % — ABNORMAL HIGH (ref 4.6–6.5)

## 2018-12-21 LAB — CBC
HCT: 37 % — ABNORMAL LOW (ref 39.0–52.0)
Hemoglobin: 12.3 g/dL — ABNORMAL LOW (ref 13.0–17.0)
MCHC: 33.2 g/dL (ref 30.0–36.0)
MCV: 91.7 fl (ref 78.0–100.0)
Platelets: 384 10*3/uL (ref 150.0–400.0)
RBC: 4.04 Mil/uL — ABNORMAL LOW (ref 4.22–5.81)
RDW: 14.1 % (ref 11.5–15.5)
WBC: 8.6 10*3/uL (ref 4.0–10.5)

## 2018-12-21 LAB — LIPID PANEL
Cholesterol: 100 mg/dL (ref 0–200)
HDL: 25.7 mg/dL — ABNORMAL LOW (ref >39.00)
LDL Cholesterol: 43 mg/dL (ref 0–99)
NonHDL: 73.84
Total CHOL/HDL Ratio: 4
Triglycerides: 152 mg/dL — ABNORMAL HIGH (ref 0.0–149.0)
VLDL: 30.4 mg/dL (ref 0.0–40.0)

## 2018-12-21 LAB — TSH: TSH: 1.51 u[IU]/mL (ref 0.35–4.50)

## 2018-12-25 ENCOUNTER — Encounter: Payer: Self-pay | Admitting: Family Medicine

## 2018-12-25 ENCOUNTER — Ambulatory Visit: Payer: Federal, State, Local not specified - PPO | Admitting: Family Medicine

## 2018-12-25 DIAGNOSIS — I10 Essential (primary) hypertension: Secondary | ICD-10-CM | POA: Diagnosis not present

## 2018-12-25 DIAGNOSIS — W540XXA Bitten by dog, initial encounter: Secondary | ICD-10-CM

## 2018-12-25 DIAGNOSIS — E669 Obesity, unspecified: Secondary | ICD-10-CM

## 2018-12-25 DIAGNOSIS — Z0000 Encounter for general adult medical examination without abnormal findings: Secondary | ICD-10-CM

## 2018-12-25 DIAGNOSIS — E782 Mixed hyperlipidemia: Secondary | ICD-10-CM | POA: Diagnosis not present

## 2018-12-25 DIAGNOSIS — E1169 Type 2 diabetes mellitus with other specified complication: Secondary | ICD-10-CM

## 2018-12-25 DIAGNOSIS — R972 Elevated prostate specific antigen [PSA]: Secondary | ICD-10-CM | POA: Diagnosis not present

## 2018-12-25 DIAGNOSIS — D649 Anemia, unspecified: Secondary | ICD-10-CM

## 2018-12-25 DIAGNOSIS — S81851A Open bite, right lower leg, initial encounter: Secondary | ICD-10-CM | POA: Insufficient documentation

## 2018-12-25 DIAGNOSIS — Z23 Encounter for immunization: Secondary | ICD-10-CM

## 2018-12-25 DIAGNOSIS — E119 Type 2 diabetes mellitus without complications: Secondary | ICD-10-CM

## 2018-12-25 HISTORY — DX: Open bite, right lower leg, initial encounter: W54.0XXA

## 2018-12-25 HISTORY — DX: Open bite, right lower leg, initial encounter: S81.851A

## 2018-12-25 NOTE — Assessment & Plan Note (Signed)
Given second Shingrix shot today

## 2018-12-25 NOTE — Assessment & Plan Note (Signed)
Well controlled, no changes to meds. Encouraged heart healthy diet such as the DASH diet and exercise as tolerated.  °

## 2018-12-25 NOTE — Progress Notes (Signed)
Subjective:    Patient ID: Gene [name], male    DOB: 1956-02-06, 63 y.o.   MRN: [id number]  No chief complaint on file.   HPI Patient is in today for follow-up.  He feels well today.  No recent febrile illness or hospitalizations.  Over the winter he has struggled with some congestion and postnasal drip but no signs of acute illness such as fevers, chills or significant cough.  He has been walking more and watching his portion sizes and is happy with his weight loss.  He has been working for [employer] recently and was bit by a dog yesterday while working.  No real significant skin breakdown just a very shallow abrasion at one spot his wife cleaned it well and he has no significant pain warmth or redness today. Denies CP/palp/SOB/HA/congestion/fevers/GI or GU c/o. Taking meds as prescribed  Past Medical History:  Diagnosis Date  . Anemia   . CAD (coronary artery disease)   . Chronic pain in right foot   . Diabetes ([place])   . Diabetes mellitus type II   . Diabetic neuropathy ([place])   . GERD (gastroesophageal reflux disease)   . H/O tobacco use, presenting hazards to health 05/21/2009   Qualifier: Diagnosis of  By: Wynona Luna  Last cigarette smoked May 30, 2015    . History of colonic polyps   . Hyperlipidemia   . Hypertension   . Insomnia 03/25/2013  . Myocardial infarction ([place])   . Neuromuscular disorder ([place])    NEUROPATHY  . Obesity 06/24/2016  . Plantar fasciitis   . Preventative health care 05/03/2014  . PVD (peripheral vascular disease) ([place])    PTA RLE 12/08 [place]  . TOBACCO USER 05/21/2009   Qualifier: Diagnosis of  By: Wynona Luna  Last cigarette smoked May 30, 2015      Past Surgical History:  Procedure Laterality Date  . CORONARY ARTERY BYPASS GRAFT     200 SVG to diagonal, LIMA to LAD, SVG to LCX  . LEFT HEART CATH AND CORS/GRAFTS ANGIOGRAPHY N/A 06/08/2018   Procedure: LEFT HEART CATH AND CORS/GRAFTS ANGIOGRAPHY;  Surgeon: Belva Crome, MD;   Location: [facility];  Service: Cardiovascular;  Laterality: N/A;  . OTHER SURGICAL HISTORY     PAD surgery  . REVISION TOTAL KNEE ARTHROPLASTY      Family History  Problem Relation Age of Onset  . Coronary artery disease Mother   . Hypertension Mother   . Hyperlipidemia Father   . Aneurysm Father        brain  . Hyperlipidemia Unknown   . Hypertension Unknown   . Stroke Unknown   . Cancer Maternal Uncle   . Colon cancer Neg Hx   . Esophageal cancer Neg Hx   . Liver cancer Neg Hx   . Pancreatic cancer Neg Hx   . Rectal cancer Neg Hx   . Stomach cancer Neg Hx     Social History   Socioeconomic History  . Marital status: Married    Spouse name: Not on file  . Number of children: Not on file  . Years of education: Not on file  . Highest education level: Not on file  Occupational History  . Not on file  Social Needs  . Financial resource strain: Not on file  . Food insecurity:    Worry: Not on file    Inability: Not on file  . Transportation needs:    Medical: Not  on file    Non-medical: Not on file  Tobacco Use  . Smoking status: Former Smoker    Packs/day: 0.50    Years: 40.00    Pack years: 20.00    Types: Cigarettes    Last attempt to quit: 05/04/2015    Years since quitting: 3.6  . Smokeless tobacco: Never Used  Substance and Sexual Activity  . Alcohol use: No    Alcohol/week: 0.0 standard drinks  . Drug use: No    Comment: rare  . Sexual activity: Yes    Comment: lives with wife, retired   Lifestyle  . Physical activity:    Days per week: Not on file    Minutes per session: Not on file  . Stress: Not on file  Relationships  . Social connections:    Talks on phone: Not on file    Gets together: Not on file    Attends religious service: Not on file    Active member of club or organization: Not on file    Attends meetings of clubs or organizations: Not on file    Relationship status: Not on file  . Intimate partner violence:    Fear of  current or ex partner: Not on file    Emotionally abused: Not on file    Physically abused: Not on file    Forced sexual activity: Not on file  Other Topics Concern  . Not on file  Social History Narrative   Occupation: grocery clerk   Second marriage - 3 children from first marriage   Tobacco Use - Yes.     Alcohol Use - yes(rare)            Outpatient Medications Prior to Visit  Medication Sig Dispense Refill  . amLODipine (NORVASC) 10 MG tablet TAKE 1 TABLET BY MOUTH ONCE DAILY 90 tablet 0  . aspirin EC 81 MG tablet Take 1 tablet (81 mg total) by mouth daily. 90 tablet 3  . atorvastatin (LIPITOR) 40 MG tablet Take 1 tablet (40 mg total) by mouth daily. 90 tablet 3  . Cyanocobalamin (B-12) 100 MCG TABS Take 100 mcg by mouth daily.     . Ferrous Fumarate (HEMOCYTE) 324 (106 Fe) MG TABS tablet Take 1 tablet (106 mg of iron total) by mouth daily. 30 tablet 3  . fish oil-omega-3 fatty acids 1000 MG capsule Take 1 g by mouth at bedtime.      . gabapentin (NEURONTIN) 600 MG tablet TAKE 1 TABLET BY MOUTH TWICE DAILY AND MAY TAKE AN EXTRA TABLET AS NEEDED FOR SEVERE PAIN **MAX 3 TABLETS A DAY** 270 tablet 0  . glimepiride (AMARYL) 4 MG tablet TAKE 1 TABLET BY MOUTH TWICE DAILY 180 tablet 1  . Insulin Glargine (LANTUS SOLOSTAR) 100 UNIT/ML Solostar Pen INJECT 30-35 UNITS SUBCUTANEOUSLY ONCE DAILY AT 10 PM PER SLIDING SCALE 15 mL 0  . Insulin Pen Needle (RELION PEN NEEDLES) 29G X 12MM MISC USE  ONCE DAILY IN THE EVENING 100 each 5  . labetalol (NORMODYNE) 200 MG tablet Take 1 tablet (200 mg total) by mouth 2 (two) times daily. 180 tablet 1  . lisinopril (PRINIVIL,ZESTRIL) 20 MG tablet Take 1 tablet (20 mg total) by mouth daily. 90 tablet 1  . metFORMIN (GLUCOPHAGE-XR) 500 MG 24 hr tablet TAKE 2 TABLETS BY MOUTH TWICE DAILY 360 tablet 0  . Multiple Vitamins-Minerals (CENTRUM PO) Take 1 tablet by mouth daily.     . nitroGLYCERIN (NITROSTAT) 0.4 MG SL tablet Place 0.4 mg  under the tongue every 5  (five) minutes as needed for chest pain.     Marland Kitchen omeprazole (PRILOSEC) 40 MG capsule TAKE 1 CAPSULE BY MOUTH ONCE DAILY 90 capsule 0  . ranolazine (RANEXA) 500 MG 12 hr tablet Take 1 tablet (500 mg total) by mouth 2 (two) times daily. 180 tablet 3  . Specialty Vitamins Products (MAGNESIUM, AMINO ACID CHELATE,) 133 MG tablet Take 1 tablet by mouth daily.     No facility-administered medications prior to visit.     No Known Allergies  Review of Systems  Constitutional: Negative for fever and malaise/fatigue.  HENT: Positive for congestion. Negative for sinus pain.   Eyes: Negative for blurred vision.  Respiratory: Negative for shortness of breath.   Cardiovascular: Negative for chest pain, palpitations and leg swelling.  Gastrointestinal: Negative for abdominal pain, blood in stool and nausea.  Genitourinary: Negative for dysuria and frequency.  Musculoskeletal: Negative for falls.  Skin: Negative for rash.  Neurological: Negative for dizziness, loss of consciousness and headaches.  Endo/Heme/Allergies: Negative for environmental allergies.  Psychiatric/Behavioral: Negative for depression. The patient is not nervous/anxious.        Objective:    Physical Exam Vitals signs and nursing note reviewed.  Constitutional:      General: He is not in acute distress.    Appearance: He is well-developed.  HENT:     Head: Normocephalic and atraumatic.     Nose: Nose normal.  Eyes:     General:        Right eye: No discharge.        Left eye: No discharge.  Neck:     Musculoskeletal: Normal range of motion and neck supple.  Cardiovascular:     Rate and Rhythm: Normal rate and regular rhythm.     Heart sounds: No murmur.  Pulmonary:     Effort: Pulmonary effort is normal.     Breath sounds: Normal breath sounds.  Abdominal:     General: Bowel sounds are normal.     Palpations: Abdomen is soft.     Tenderness: There is no abdominal tenderness.  Skin:    General: Skin is warm and  dry.     Comments: r tooth marks only one with broken skin, no redness or fluctuance. Only bruising upper posterior calf on right leg.   Neurological:     Mental Status: He is alert and oriented to person, place, and time.     BP (!) 118/56 (BP Location: Left Arm, Patient Position: Sitting, Cuff Size: Normal)   Pulse 79   Temp 97.8 F (36.6 C) (Oral)   Resp 18   Wt 209 lb 9.6 oz (95.1 kg)   SpO2 98%   BMI 29.23 kg/m  Wt Readings from Last 3 Encounters:  12/25/18 209 lb 9.6 oz (95.1 kg)  08/03/18 215 lb 6.4 oz (97.7 kg)  06/16/18 216 lb (98 kg)     Lab Results  Component Value Date   WBC 8.6 12/21/2018   HGB 12.3 (L) 12/21/2018   HCT 37.0 (L) 12/21/2018   PLT 384.0 12/21/2018   GLUCOSE 71 12/21/2018   CHOL 100 12/21/2018   TRIG 152.0 (H) 12/21/2018   HDL 25.70 (L) 12/21/2018   LDLCALC 43 12/21/2018   ALT 10 12/21/2018   AST 12 12/21/2018   NA 141 12/21/2018   K 4.4 12/21/2018   CL 101 12/21/2018   CREATININE 0.88 12/21/2018   BUN 18 12/21/2018   CO2 33 (H) 12/21/2018  TSH 1.51 12/21/2018   PSA 6.95 (H) 05/02/2018   INR 1.1 07/31/2008   HGBA1C 7.7 (H) 12/21/2018   MICROALBUR 8.9 (H) 05/02/2018    Lab Results  Component Value Date   TSH 1.51 12/21/2018   Lab Results  Component Value Date   WBC 8.6 12/21/2018   HGB 12.3 (L) 12/21/2018   HCT 37.0 (L) 12/21/2018   MCV 91.7 12/21/2018   PLT 384.0 12/21/2018   Lab Results  Component Value Date   NA 141 12/21/2018   K 4.4 12/21/2018   CO2 33 (H) 12/21/2018   GLUCOSE 71 12/21/2018   BUN 18 12/21/2018   CREATININE 0.88 12/21/2018   BILITOT 0.3 12/21/2018   ALKPHOS 82 12/21/2018   AST 12 12/21/2018   ALT 10 12/21/2018   PROT 6.6 12/21/2018   ALBUMIN 4.2 12/21/2018   CALCIUM 9.3 12/21/2018   ANIONGAP 14 06/08/2018   GFR 87.21 12/21/2018   Lab Results  Component Value Date   CHOL 100 12/21/2018   Lab Results  Component Value Date   HDL 25.70 (L) 12/21/2018   Lab Results  Component Value Date     LDLCALC 43 12/21/2018   Lab Results  Component Value Date   TRIG 152.0 (H) 12/21/2018   Lab Results  Component Value Date   CHOLHDL 4 12/21/2018   Lab Results  Component Value Date   HGBA1C 7.7 (H) 12/21/2018       Assessment & Plan:   Problem List Items Addressed This Visit    Diabetes mellitus type 2 in obese ([place])    hgba1c acceptable, minimize simple carbs. Increase exercise as tolerated. Continue current meds      Relevant Orders   Hemoglobin A1c   Hyperlipidemia, mixed    Encouraged heart healthy diet, increase exercise, avoid trans fats, consider a krill oil cap daily      Relevant Orders   Lipid panel   Essential hypertension    Well controlled, no changes to meds. Encouraged heart healthy diet such as the DASH diet and exercise as tolerated.       Relevant Orders   CBC   Comprehensive metabolic panel   TSH   Anemia    monitor      Elevated PSA    Was improved on last check but still elevated. He never saw urology when referred and declines today agrees to recheck at next visit and consider referral at that time if still elevated      Relevant Orders   PSA   Preventative health care    Given second Shingrix shot today      Dog bite of calf, right, initial encounter    [place] for [employer] now. He was bit by a dog yesterday but the skin was barely broken. No significant pain or redness. They cleaned it well. He will report if any redness, heat or fluctuance occurs         I am having [name] maintain his Multiple Vitamins-Minerals (CENTRUM PO), nitroGLYCERIN, fish oil-omega-3 fatty acids, Insulin Pen Needle, glimepiride, labetalol, Ferrous Fumarate, atorvastatin, magnesium (amino acid chelate), B-12, aspirin EC, ranolazine, omeprazole, metFORMIN, gabapentin, amLODipine, Insulin Glargine, and lisinopril.  No orders of the defined types were placed in this encounter.    Penni Homans, MD

## 2018-12-25 NOTE — Assessment & Plan Note (Signed)
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 He was bit by a dog yesterday but the skin was barely broken. No significant pain or redness. They cleaned it well. He will report if any redness, heat or fluctuance occurs

## 2018-12-25 NOTE — Assessment & Plan Note (Signed)
Was improved on last check but still elevated. He never saw urology when referred and declines today agrees to recheck at next visit and consider referral at that time if still elevated

## 2018-12-25 NOTE — Assessment & Plan Note (Signed)
Encouraged heart healthy diet, increase exercise, avoid trans fats, consider a krill oil cap daily 

## 2018-12-25 NOTE — Assessment & Plan Note (Signed)
monitor

## 2018-12-25 NOTE — Assessment & Plan Note (Signed)
hgba1c acceptable, minimize simple carbs. Increase exercise as tolerated. Continue current meds 

## 2018-12-25 NOTE — Patient Instructions (Signed)
Carbohydrate Counting for Diabetes Mellitus, Adult  Carbohydrate counting is a method of keeping track of how many carbohydrates you eat. Eating carbohydrates naturally increases the amount of sugar (glucose) in the blood. Counting how many carbohydrates you eat helps keep your blood glucose within normal limits, which helps you manage your diabetes (diabetes mellitus). It is important to know how many carbohydrates you can safely have in each meal. This is different for every person. A diet and nutrition specialist (registered dietitian) can help you make a meal plan and calculate how many carbohydrates you should have at each meal and snack. Carbohydrates are found in the following foods:  Grains, such as breads and cereals.  Dried beans and soy products.  Starchy vegetables, such as potatoes, peas, and corn.  Fruit and fruit juices.  Milk and yogurt.  Sweets and snack foods, such as cake, cookies, candy, chips, and soft drinks. How do I count carbohydrates? There are two ways to count carbohydrates in food. You can use either of the methods or a combination of both. Reading "Nutrition Facts" on packaged food The "Nutrition Facts" list is included on the labels of almost all packaged foods and beverages in the [place] It includes:  The serving size.  Information about nutrients in each serving, including the grams (g) of carbohydrate per serving. To use the "Nutrition Facts":  Decide how many servings you will have.  Multiply the number of servings by the number of carbohydrates per serving.  The resulting number is the total amount of carbohydrates that you will be having. Learning standard serving sizes of other foods When you eat carbohydrate foods that are not packaged or do not include "Nutrition Facts" on the label, you need to measure the servings in order to count the amount of carbohydrates:  Measure the foods that you will eat with a food scale or measuring cup, if needed.   Decide how many standard-size servings you will eat.  Multiply the number of servings by 15. Most carbohydrate-rich foods have about 15 g of carbohydrates per serving. ? For example, if you eat 8 oz (170 g) of strawberries, you will have eaten 2 servings and 30 g of carbohydrates (2 servings x 15 g = 30 g).  For foods that have more than one food mixed, such as soups and casseroles, you must count the carbohydrates in each food that is included. The following list contains standard serving sizes of common carbohydrate-rich foods. Each of these servings has about 15 g of carbohydrates:   hamburger bun or  English muffin.   oz (15 mL) syrup.   oz (14 g) jelly.  1 slice of bread.  1 six-inch tortilla.  3 oz (85 g) cooked rice or pasta.  4 oz (113 g) cooked dried beans.  4 oz (113 g) starchy vegetable, such as peas, corn, or potatoes.  4 oz (113 g) hot cereal.  4 oz (113 g) mashed potatoes or  of a large baked potato.  4 oz (113 g) canned or frozen fruit.  4 oz (120 mL) fruit juice.  4-6 crackers.  6 chicken nuggets.  6 oz (170 g) unsweetened dry cereal.  6 oz (170 g) plain fat-free yogurt or yogurt sweetened with artificial sweeteners.  8 oz (240 mL) milk.  8 oz (170 g) fresh fruit or one small piece of fruit.  24 oz (680 g) popped popcorn. Example of carbohydrate counting Sample meal  3 oz (85 g) chicken breast.  6 oz (170 g)   brown rice.  4 oz (113 g) corn.  8 oz (240 mL) milk.  8 oz (170 g) strawberries with sugar-free whipped topping. Carbohydrate calculation 1. Identify the foods that contain carbohydrates: ? Rice. ? Corn. ? Milk. ? Strawberries. 2. Calculate how many servings you have of each food: ? 2 servings rice. ? 1 serving corn. ? 1 serving milk. ? 1 serving strawberries. 3. Multiply each number of servings by 15 g: ? 2 servings rice x 15 g = 30 g. ? 1 serving corn x 15 g = 15 g. ? 1 serving milk x 15 g = 15 g. ? 1 serving  strawberries x 15 g = 15 g. 4. Add together all of the amounts to find the total grams of carbohydrates eaten: ? 30 g + 15 g + 15 g + 15 g = 75 g of carbohydrates total. Summary  Carbohydrate counting is a method of keeping track of how many carbohydrates you eat.  Eating carbohydrates naturally increases the amount of sugar (glucose) in the blood.  Counting how many carbohydrates you eat helps keep your blood glucose within normal limits, which helps you manage your diabetes.  A diet and nutrition specialist (registered dietitian) can help you make a meal plan and calculate how many carbohydrates you should have at each meal and snack. This information is not intended to replace advice given to you by your health care provider. Make sure you discuss any questions you have with your health care provider. Document Released: 11/01/2005 Document Revised: 05/11/2017 Document Reviewed: 04/14/2016 Elsevier Interactive Patient Education  2019 [employer]  

## 2019-01-02 NOTE — Addendum Note (Signed)
Addended by: Magdalene Molly A on: 01/02/2019 09:20 AM   Modules accepted: Orders

## 2019-01-03 ENCOUNTER — Ambulatory Visit: Payer: Federal, State, Local not specified - PPO | Admitting: Cardiology

## 2019-01-03 ENCOUNTER — Encounter: Payer: Self-pay | Admitting: Cardiology

## 2019-01-03 VITALS — BP 150/72 | HR 83 | Ht 71.0 in | Wt 210.8 lb

## 2019-01-03 DIAGNOSIS — E782 Mixed hyperlipidemia: Secondary | ICD-10-CM

## 2019-01-03 DIAGNOSIS — Z951 Presence of aortocoronary bypass graft: Secondary | ICD-10-CM

## 2019-01-03 DIAGNOSIS — R9439 Abnormal result of other cardiovascular function study: Secondary | ICD-10-CM | POA: Diagnosis not present

## 2019-01-03 DIAGNOSIS — I10 Essential (primary) hypertension: Secondary | ICD-10-CM

## 2019-01-03 DIAGNOSIS — I209 Angina pectoris, unspecified: Secondary | ICD-10-CM

## 2019-01-03 NOTE — Progress Notes (Signed)
Cardiology Office Note:    Date:  01/03/2019   ID:  Gene [name], DOB 1956/01/13, MRN [id number]  PCP:  Gene Lukes, MD  Cardiologist:  Gene Campus, MD    Referring MD: Gene Lukes, MD   Chief Complaint  Patient presents with  . Follow-up  Doing very well  History of Present Illness:    Gene [name] is a 63 y.o. male coronary disease status post coronary bypass graft.  He did have a stress test done last summer which showed some ischemia in LAD territory.  He preferred medical treatment with medications and we succeeded.  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  He is overall very happy the way he feels.  Past Medical History:  Diagnosis Date  . Anemia   . CAD (coronary artery disease)   . Chronic pain in right foot   . Diabetes ([place])   . Diabetes mellitus type II   . Diabetic neuropathy ([place])   . GERD (gastroesophageal reflux disease)   . H/O tobacco use, presenting hazards to health 05/21/2009   Qualifier: Diagnosis of  By: Wynona Luna  Last cigarette smoked May 30, 2015    . History of colonic polyps   . Hyperlipidemia   . Hypertension   . Insomnia 03/25/2013  . Myocardial infarction ([place])   . Neuromuscular disorder ([place])    NEUROPATHY  . Obesity 06/24/2016  . Plantar fasciitis   . Preventative health care 05/03/2014  . PVD (peripheral vascular disease) ([place])    PTA RLE 12/08 [place]  . TOBACCO USER 05/21/2009   Qualifier: Diagnosis of  By: Wynona Luna  Last cigarette smoked May 30, 2015      Past Surgical History:  Procedure Laterality Date  . CORONARY ARTERY BYPASS  GRAFT     200 SVG to diagonal, LIMA to LAD, SVG to LCX  . LEFT HEART CATH AND CORS/GRAFTS ANGIOGRAPHY N/A 06/08/2018   Procedure: LEFT HEART CATH AND CORS/GRAFTS ANGIOGRAPHY;  Surgeon: Belva Crome, MD;  Location: [facility];  Service: Cardiovascular;  Laterality: N/A;  . OTHER SURGICAL HISTORY     PAD surgery  . REVISION TOTAL KNEE ARTHROPLASTY      Current Medications: Current Meds  Medication Sig  . amLODipine (NORVASC) 10 MG tablet TAKE 1 TABLET BY MOUTH ONCE DAILY  . aspirin EC 81 MG tablet Take 1 tablet (81 mg total) by mouth daily.  Marland Kitchen atorvastatin (LIPITOR) 40 MG tablet Take 1 tablet (40 mg total) by mouth daily.  . Cyanocobalamin (B-12) 100 MCG TABS Take 100 mcg by mouth daily.   . Ferrous Fumarate (HEMOCYTE) 324 (106 Fe) MG TABS tablet Take 1 tablet (106 mg of iron total) by mouth daily.  . fish oil-omega-3 fatty acids 1000 MG capsule Take 1 g by mouth at bedtime.    . gabapentin (NEURONTIN) 600 MG tablet TAKE 1 TABLET BY MOUTH TWICE DAILY AND MAY TAKE AN EXTRA TABLET AS NEEDED FOR SEVERE PAIN **MAX 3 TABLETS A DAY**  . glimepiride (AMARYL) 4 MG tablet  TAKE 1 TABLET BY MOUTH TWICE DAILY  . Insulin Glargine (LANTUS SOLOSTAR) 100 UNIT/ML Solostar Pen INJECT 30-35 UNITS SUBCUTANEOUSLY ONCE DAILY AT 10 PM PER SLIDING SCALE  . Insulin Pen Needle (RELION PEN NEEDLES) 29G X 12MM MISC USE  ONCE DAILY IN THE EVENING  . labetalol (NORMODYNE) 200 MG tablet Take 1 tablet (200 mg total) by mouth 2 (two) times daily.  Marland Kitchen lisinopril (PRINIVIL,ZESTRIL) 20 MG tablet Take 1 tablet (20 mg total) by mouth daily.  . metFORMIN (GLUCOPHAGE-XR) 500 MG 24 hr tablet TAKE 2 TABLETS BY MOUTH TWICE DAILY  . Multiple Vitamins-Minerals (CENTRUM PO) Take 1 tablet by mouth daily.   . nitroGLYCERIN (NITROSTAT) 0.4 MG SL tablet Place 0.4 mg under the tongue every 5 (five) minutes as needed for chest pain.   Marland Kitchen omeprazole (PRILOSEC) 40 MG capsule TAKE 1 CAPSULE BY MOUTH ONCE DAILY  . ranolazine (RANEXA) 500  MG 12 hr tablet Take 1 tablet (500 mg total) by mouth 2 (two) times daily.  Marland Kitchen Specialty Vitamins Products (MAGNESIUM, AMINO ACID CHELATE,) 133 MG tablet Take 1 tablet by mouth daily.     Allergies:   Patient has no known allergies.   Social History   Socioeconomic History  . Marital status: Married    Spouse name: Not on file  . Number of children: Not on file  . Years of education: Not on file  . Highest education level: Not on file  Occupational History  . Not on file  Social Needs  . Financial resource strain: Not on file  . Food insecurity:    Worry: Not on file    Inability: Not on file  . Transportation needs:    Medical: Not on file    Non-medical: Not on file  Tobacco Use  . Smoking status: Former Smoker    Packs/day: 0.50    Years: 40.00    Pack years: 20.00    Types: Cigarettes    Last attempt to quit: 05/04/2015    Years since quitting: 3.6  . Smokeless tobacco: Never Used  Substance and Sexual Activity  . Alcohol use: No    Alcohol/week: 0.0 standard drinks  . Drug use: No    Comment: rare  . Sexual activity: Yes    Comment: lives with wife, retired   Lifestyle  . Physical activity:    Days per week: Not on file    Minutes per session: Not on file  . Stress: Not on file  Relationships  . Social connections:    Talks on phone: Not on file    Gets together: Not on file    Attends religious service: Not on file    Active member of club or organization: Not on file    Attends meetings of clubs or organizations: Not on file    Relationship status: Not on file  Other Topics Concern  . Not on file  Social History Narrative   Occupation: grocery clerk   Second marriage - 3 children from first marriage   Tobacco Use - Yes.     Alcohol Use - yes(rare)             Family History: The patient's family history includes Aneurysm in his father; Cancer in his maternal uncle; Coronary artery disease in his mother; Hyperlipidemia in his father and unknown  relative; Hypertension in his mother and unknown relative; Stroke in his unknown relative. There is no history of Colon cancer, Esophageal cancer, Liver cancer, Pancreatic cancer, Rectal cancer, or  Stomach cancer. ROS:   Please see the history of present illness.    All 14 point review of systems negative except as described per history of present illness  EKGs/Labs/Other Studies Reviewed:      Recent Labs: 12/21/2018: ALT 10; BUN 18; Creatinine, Ser 0.88; Hemoglobin 12.3; Platelets 384.0; Potassium 4.4; Sodium 141; TSH 1.51  Recent Lipid Panel    Component Value Date/Time   CHOL 100 12/21/2018 0902   TRIG 152.0 (H) 12/21/2018 0902   HDL 25.70 (L) 12/21/2018 0902   CHOLHDL 4 12/21/2018 0902   VLDL 30.4 12/21/2018 0902   LDLCALC 43 12/21/2018 0902    Physical Exam:    VS:  BP (!) 150/72   Pulse 83   Ht 5\' 11"  (1.803 m)   Wt 210 lb 12.8 oz (95.6 kg)   SpO2 98%   BMI 29.40 kg/m     Wt Readings from Last 3 Encounters:  01/03/19 210 lb 12.8 oz (95.6 kg)  12/25/18 209 lb 9.6 oz (95.1 kg)  08/03/18 215 lb 6.4 oz (97.7 kg)     GEN:  Well nourished, well developed in no acute distress HEENT: Normal NECK: No JVD; No carotid bruits LYMPHATICS: No lymphadenopathy CARDIAC: RRR, no murmurs, no rubs, no gallops RESPIRATORY:  Clear to auscultation without rales, wheezing or rhonchi  ABDOMEN: Soft, non-tender, non-distended MUSCULOSKELETAL:  No edema; No deformity  SKIN: Warm and dry LOWER EXTREMITIES: no swelling NEUROLOGIC:  Alert and oriented x 3 PSYCHIATRIC:  Normal affect   ASSESSMENT:    1. Status post coronary artery bypass graft   2. Hyperlipidemia, mixed   3. Abnormal stress test   4. Essential hypertension   5. Angina pectoris ([place])    PLAN:    In order of problems listed above:  1. Status post coronary artery bypass graft doing well from that point review continue present management. 2. Lipidemia his last fasting lipid profile is excellent we will continue  present management. 3. Stress test managed medically.  He is asymptomatic. 4. Essential hypertension blood pressure well controlled we will continue present management. 5. Angina pectoris denies having any episode of chest pain now   Medication Adjustments/Labs and Tests Ordered: Current medicines are reviewed at length with the patient today.  Concerns regarding medicines are outlined above.  No orders of the defined types were placed in this encounter.  Medication changes: No orders of the defined types were placed in this encounter.   Signed, Park Liter, MD, [facility] 01/03/2019 12:07 PM    [place]

## 2019-01-03 NOTE — Patient Instructions (Signed)

## 2019-01-22 ENCOUNTER — Other Ambulatory Visit: Payer: Self-pay | Admitting: Family Medicine

## 2019-01-23 ENCOUNTER — Telehealth: Payer: Self-pay | Admitting: Family Medicine

## 2019-01-23 MED ORDER — INSULIN GLARGINE 100 UNIT/ML SOLOSTAR PEN: PEN_INJECTOR | SUBCUTANEOUS | 0 refills | Status: DC

## 2019-01-23 NOTE — Telephone Encounter (Signed)
Can this Rx request be reviewed- when I go to pend it- it looks like it is pended already waiting for review by PCP- patient is out of medication and is waiting for refill.

## 2019-01-23 NOTE — Telephone Encounter (Signed)
Medications sent in.

## 2019-01-23 NOTE — Telephone Encounter (Signed)
Copied from [place] [phone]. Topic: Quick Communication - Rx Refill/Question >> Jan 23, 2019 12:24 PM Gustavus Messing wrote: Medication: Insulin Glargine (LANTUS SOLOSTAR) 100 UNIT/ML Solostar Pen   Has the patient contacted their pharmacy? Yes.   (Agent: If yes, when and what did the pharmacy advise?) Has no refills. Called Sunday and has not heard back. Is completely out of Insulin  Preferred Pharmacy (with phone number or street name): [place], [place] [phone] (Phone) [phone] (Fax)    Agent: Please be advised that RX refills may take up to 3 business days. We ask that you follow-up with your pharmacy.

## 2019-02-08 ENCOUNTER — Other Ambulatory Visit: Payer: Self-pay | Admitting: Family Medicine

## 2019-02-22 ENCOUNTER — Other Ambulatory Visit: Payer: Self-pay | Admitting: Family Medicine

## 2019-03-05 ENCOUNTER — Other Ambulatory Visit: Payer: Self-pay | Admitting: Family Medicine

## 2019-05-02 ENCOUNTER — Other Ambulatory Visit: Payer: Self-pay | Admitting: Family Medicine

## 2019-05-07 ENCOUNTER — Telehealth: Payer: Self-pay | Admitting: Family Medicine

## 2019-05-07 NOTE — Telephone Encounter (Signed)
So some Metformin has been recalled but not all. He should discuss with his pharmacist to see if he can get a replacement for the XR version I they cannot then we can switch him to the IR version which has not been recalled but has possibly more side effects sometimes.

## 2019-05-07 NOTE — Telephone Encounter (Signed)
Patient got letter from [facility] regarding his metFORMIN medication.  Patient would like to discuss this and is curious what he needs to take.  Call back # [phone]

## 2019-05-07 NOTE — Telephone Encounter (Signed)
Please advise 

## 2019-05-09 ENCOUNTER — Other Ambulatory Visit: Payer: Self-pay | Admitting: Family Medicine

## 2019-05-14 NOTE — Telephone Encounter (Signed)
Spoke with patient he has gotten his medication

## 2019-05-17 ENCOUNTER — Other Ambulatory Visit: Payer: Self-pay | Admitting: Cardiology

## 2019-05-17 NOTE — Telephone Encounter (Signed)
Atorvastatin refill sent to [facility] in [facility]

## 2019-05-28 ENCOUNTER — Other Ambulatory Visit (INDEPENDENT_AMBULATORY_CARE_PROVIDER_SITE_OTHER): Payer: Federal, State, Local not specified - PPO

## 2019-05-28 ENCOUNTER — Other Ambulatory Visit: Payer: Self-pay

## 2019-05-28 DIAGNOSIS — E782 Mixed hyperlipidemia: Secondary | ICD-10-CM

## 2019-05-28 DIAGNOSIS — E1169 Type 2 diabetes mellitus with other specified complication: Secondary | ICD-10-CM

## 2019-05-28 DIAGNOSIS — I10 Essential (primary) hypertension: Secondary | ICD-10-CM

## 2019-05-28 DIAGNOSIS — E669 Obesity, unspecified: Secondary | ICD-10-CM

## 2019-05-28 DIAGNOSIS — R972 Elevated prostate specific antigen [PSA]: Secondary | ICD-10-CM

## 2019-05-28 LAB — COMPREHENSIVE METABOLIC PANEL
ALT: 10 U/L (ref 0–53)
AST: 11 U/L (ref 0–37)
Albumin: 4 g/dL (ref 3.5–5.2)
Alkaline Phosphatase: 82 U/L (ref 39–117)
BUN: 23 mg/dL (ref 6–23)
CO2: 29 mEq/L (ref 19–32)
Calcium: 8.8 mg/dL (ref 8.4–10.5)
Chloride: 100 mEq/L (ref 96–112)
Creatinine, Ser: 1.08 mg/dL (ref 0.40–1.50)
GFR: 68.76 mL/min (ref >60.00)
Glucose, Bld: 187 mg/dL — ABNORMAL HIGH (ref 70–99)
Potassium: 4.2 mEq/L (ref 3.5–5.1)
Sodium: 139 mEq/L (ref 135–145)
Total Bilirubin: 0.3 mg/dL (ref 0.2–1.2)
Total Protein: 6.3 g/dL (ref 6.0–8.3)

## 2019-05-28 LAB — LIPID PANEL
Cholesterol: 84 mg/dL (ref 0–200)
HDL: 21.4 mg/dL — ABNORMAL LOW (ref >39.00)
LDL Cholesterol: 38 mg/dL (ref 0–99)
NonHDL: 62.9
Total CHOL/HDL Ratio: 4
Triglycerides: 126 mg/dL (ref 0.0–149.0)
VLDL: 25.2 mg/dL (ref 0.0–40.0)

## 2019-05-28 LAB — PSA: PSA: 7.99 ng/mL — ABNORMAL HIGH (ref 0.10–4.00)

## 2019-05-28 LAB — CBC
HCT: 35.6 % — ABNORMAL LOW (ref 39.0–52.0)
Hemoglobin: 11.9 g/dL — ABNORMAL LOW (ref 13.0–17.0)
MCHC: 33.5 g/dL (ref 30.0–36.0)
MCV: 92.7 fl (ref 78.0–100.0)
Platelets: 355 10*3/uL (ref 150.0–400.0)
RBC: 3.84 Mil/uL — ABNORMAL LOW (ref 4.22–5.81)
RDW: 13.9 % (ref 11.5–15.5)
WBC: 12.6 10*3/uL — ABNORMAL HIGH (ref 4.0–10.5)

## 2019-05-28 LAB — TSH: TSH: 0.89 u[IU]/mL (ref 0.35–4.50)

## 2019-05-28 LAB — HEMOGLOBIN A1C: Hgb A1c MFr Bld: 10 % — ABNORMAL HIGH (ref 4.6–6.5)

## 2019-05-28 NOTE — Progress Notes (Signed)
la 

## 2019-05-30 ENCOUNTER — Other Ambulatory Visit: Payer: Self-pay | Admitting: Family Medicine

## 2019-05-31 ENCOUNTER — Telehealth: Payer: Self-pay | Admitting: Family Medicine

## 2019-05-31 ENCOUNTER — Encounter: Payer: Federal, State, Local not specified - PPO | Admitting: Family Medicine

## 2019-05-31 NOTE — Telephone Encounter (Signed)
LVM for pt to reshedule or do VOV for his cpe if pt still with insurance [employer]. Provider will not be in office today and needed to reschedule or do VV.

## 2019-06-04 ENCOUNTER — Other Ambulatory Visit: Payer: Self-pay | Admitting: Cardiology

## 2019-06-04 MED ORDER — RANOLAZINE ER 500 MG PO TB12: 500.0000 mg | ORAL_TABLET | Freq: Two times a day (BID) | ORAL | 0 refills | Status: DC

## 2019-06-04 NOTE — Telephone Encounter (Signed)
°*  STAT* If patient is at the pharmacy, call can be transferred to refill team.   1. Which medications need to be refilled? (please list name of each medication and dose if known) Ranexa 500mg  tablet  2. Which pharmacy/location (including street and city if local pharmacy) is medication to be sent to?[id number]  3. Do they need a 30 day or 90 day supply? [place]

## 2019-06-04 NOTE — Telephone Encounter (Signed)
Rx refill sent to pharmacy. 

## 2019-07-09 ENCOUNTER — Ambulatory Visit (INDEPENDENT_AMBULATORY_CARE_PROVIDER_SITE_OTHER): Payer: Federal, State, Local not specified - PPO | Admitting: Cardiology

## 2019-07-09 ENCOUNTER — Encounter: Payer: Self-pay | Admitting: Cardiology

## 2019-07-09 ENCOUNTER — Other Ambulatory Visit: Payer: Self-pay

## 2019-07-09 VITALS — BP 138/72 | HR 89 | Ht 71.0 in | Wt 195.8 lb

## 2019-07-09 DIAGNOSIS — I739 Peripheral vascular disease, unspecified: Secondary | ICD-10-CM | POA: Diagnosis not present

## 2019-07-09 DIAGNOSIS — R9439 Abnormal result of other cardiovascular function study: Secondary | ICD-10-CM | POA: Diagnosis not present

## 2019-07-09 DIAGNOSIS — Z951 Presence of aortocoronary bypass graft: Secondary | ICD-10-CM

## 2019-07-09 DIAGNOSIS — I25118 Atherosclerotic heart disease of native coronary artery with other forms of angina pectoris: Secondary | ICD-10-CM

## 2019-07-09 DIAGNOSIS — I10 Essential (primary) hypertension: Secondary | ICD-10-CM | POA: Diagnosis not present

## 2019-07-09 NOTE — Patient Instructions (Signed)

## 2019-07-09 NOTE — Progress Notes (Signed)
Cardiology Office Note:    Date:  07/09/2019   ID:  Gene [name], DOB 1956/07/28, MRN [id number]  PCP:  Mosie Lukes, MD  Cardiologist:  Jenne Campus, MD    Referring MD: Mosie Lukes, MD   Chief Complaint  Patient presents with  . Follow-up  Doing well  History of Present Illness:    Gene [name] is a 63 y.o. male with coronary artery disease.  Last year he had a stress test done which was abnormal after that he got cardiac catheterization done diffuse lesions no target for intervention medical therapy has been recommended.  He works for [employer] and he has to walk a lot he does at least 15,000 steps every single day.  He does well doing it.  Described very rare episode of shortness of breath and chest pain.  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  Recently he fell down.  He tripped and fell down injuring his left elbow he is out of work right now on leave because of that.  Denies having any otherwise chest pain, tightness, pressure, burning in the chest except as described above.  He lost significant amount of weight because of this he stopped taking his insulin thinking that he does not need it anymore.  His last hemoglobin A1c is 10.  He was put back on appropriate medication things are getting better right now.  Still complain of having some neuropathy in the lower extremities.  Past Medical History:  Diagnosis Date  . Anemia   . CAD (coronary artery disease)   . Chronic pain in right foot   . Diabetes ([place])   . Diabetes mellitus type II   . Diabetic neuropathy ([place])   . GERD (gastroesophageal reflux disease)   . H/O tobacco use, presenting hazards to health 05/21/2009   Qualifier: Diagnosis of  By: Wynona Luna  Last cigarette smoked May 30, 2015    . History of colonic polyps   . Hyperlipidemia   . Hypertension   . Insomnia 03/25/2013  . Myocardial infarction ([place])   . Neuromuscular disorder ([place])    NEUROPATHY  . Obesity 06/24/2016  . Plantar fasciitis   . Preventative health care 05/03/2014  . PVD (peripheral vascular disease) ([place])    PTA RLE 12/08 [place]  . TOBACCO USER 05/21/2009   Qualifier: Diagnosis of  By: Wynona Luna  Last cigarette smoked May 30, 2015      Past Surgical History:  Procedure Laterality Date  . CORONARY ARTERY BYPASS GRAFT     200 SVG to diagonal, LIMA to LAD, SVG to LCX  . LEFT HEART CATH AND CORS/GRAFTS ANGIOGRAPHY N/A 06/08/2018   Procedure: LEFT HEART CATH AND CORS/GRAFTS ANGIOGRAPHY;  Surgeon: Belva Crome, MD;  Location: [facility];  Service: Cardiovascular;  Laterality: N/A;  . OTHER SURGICAL HISTORY     PAD surgery  . REVISION TOTAL KNEE ARTHROPLASTY      Current Medications: Current Meds  Medication Sig  . amLODipine (NORVASC) 10 MG tablet Take 1 tablet by mouth once daily  . aspirin EC 81 MG tablet Take 1 tablet (81 mg total) by mouth daily.  Marland Kitchen atorvastatin (LIPITOR) 40 MG tablet Take 1 tablet by mouth once daily  . Cyanocobalamin (B-12) 100 MCG TABS Take 100 mcg by mouth daily.   . Ferrous Fumarate (HEMOCYTE) 324 (106 Fe) MG TABS tablet Take 1 tablet (  106 mg of iron total) by mouth daily.  . fish oil-omega-3 fatty acids 1000 MG capsule Take 1 g by mouth at bedtime.    . gabapentin (NEURONTIN) 600 MG tablet TAKE 1 TABLET BY MOUTH TWICE DAILY AND MAY TAKE AN EXTRA TABLET AS NEEDED FOR SEVERE PAIN ** MAX 3 TABLETS A DAY **  . glimepiride (AMARYL) 4 MG tablet Take 1 tablet by mouth twice daily  . Insulin Glargine (LANTUS SOLOSTAR) 100 UNIT/ML Solostar Pen INJECT 30-35 UNITS SUBCUTANEOUSLY ONCE DAILY AT 10 PM PER SLIDING SCALE  . Insulin Glargine (LANTUS SOLOSTAR) 100 UNIT/ML Solostar Pen INJECT 30 TO 35 UNITS SUBCUTANEOUSLY ONCE DAILY AT  10  PM  PER  SLIDING  SCALE  . Insulin Pen Needle (RELION PEN NEEDLES) 29G X 12MM MISC USE  ONCE DAILY IN THE EVENING  . labetalol (NORMODYNE) 200 MG tablet Take 1 tablet by mouth twice daily  .  lisinopril (PRINIVIL,ZESTRIL) 20 MG tablet Take 1 tablet (20 mg total) by mouth daily.  . metFORMIN (GLUCOPHAGE-XR) 500 MG 24 hr tablet Take 2 tablets by mouth twice daily  . Multiple Vitamins-Minerals (CENTRUM PO) Take 1 tablet by mouth daily.   . nitroGLYCERIN (NITROSTAT) 0.4 MG SL tablet Place 0.4 mg under the tongue every 5 (five) minutes as needed for chest pain.   Marland Kitchen omeprazole (PRILOSEC) 40 MG capsule Take 1 capsule by mouth once daily  . ranolazine (RANEXA) 500 MG 12 hr tablet Take 1 tablet (500 mg total) by mouth 2 (two) times daily.  Marland Kitchen Specialty Vitamins Products (MAGNESIUM, AMINO ACID CHELATE,) 133 MG tablet Take 1 tablet by mouth daily.     Allergies:   Patient has no known allergies.   Social History   Socioeconomic History  . Marital status: Married    Spouse name: Not on file  . Number of children: Not on file  . Years of education: Not on file  . Highest education level: Not on file  Occupational History  . Not on file  Social Needs  . Financial resource strain: Not on file  . Food insecurity    Worry: Not on file    Inability: Not on file  . Transportation needs    Medical: Not on file    Non-medical: Not on file  Tobacco Use  . Smoking status: Former Smoker    Packs/day: 0.50    Years: 40.00    Pack years: 20.00    Types: Cigarettes    Quit date: 05/04/2015    Years since quitting: 4.1  . Smokeless tobacco: Never Used  Substance and Sexual Activity  . Alcohol use: No    Alcohol/week: 0.0 standard drinks  . Drug use: No    Comment: rare  . Sexual activity: Yes    Comment: lives with wife, retired   Lifestyle  . Physical activity    Days per week: Not on file    Minutes per session: Not on file  . Stress: Not on file  Relationships  . Social Herbalist on phone: Not on file    Gets together: Not on file    Attends religious service: Not on file    Active member of club or organization: Not on file    Attends meetings of clubs or  organizations: Not on file    Relationship status: Not on file  Other Topics Concern  . Not on file  Social History Narrative   Occupation: TEFL teacher   Second marriage - 3  children from first marriage   Tobacco Use - Yes.     Alcohol Use - yes(rare)             Family History: The patient's family history includes Aneurysm in his father; Cancer in his maternal uncle; Coronary artery disease in his mother; Hyperlipidemia in his father and unknown relative; Hypertension in his mother and unknown relative; Stroke in his unknown relative. There is no history of Colon cancer, Esophageal cancer, Liver cancer, Pancreatic cancer, Rectal cancer, or Stomach cancer. ROS:   Please see the history of present illness.    All 14 point review of systems negative except as described per history of present illness  EKGs/Labs/Other Studies Reviewed:      Recent Labs: 05/28/2019: ALT 10; BUN 23; Creatinine, Ser 1.08; Hemoglobin 11.9; Platelets 355.0; Potassium 4.2; Sodium 139; TSH 0.89  Recent Lipid Panel    Component Value Date/Time   CHOL 84 05/28/2019 0856   TRIG 126.0 05/28/2019 0856   HDL 21.40 (L) 05/28/2019 0856   CHOLHDL 4 05/28/2019 0856   VLDL 25.2 05/28/2019 0856   LDLCALC 38 05/28/2019 0856    Physical Exam:    VS:  BP 138/72   Pulse 89   Ht 5\' 11"  (1.803 m)   Wt 195 lb 12.8 oz (88.8 kg)   SpO2 98%   BMI 27.31 kg/m     Wt Readings from Last 3 Encounters:  07/09/19 195 lb 12.8 oz (88.8 kg)  01/03/19 210 lb 12.8 oz (95.6 kg)  12/25/18 209 lb 9.6 oz (95.1 kg)     GEN:  Well nourished, well developed in no acute distress HEENT: Normal NECK: No JVD; No carotid bruits LYMPHATICS: No lymphadenopathy CARDIAC: RRR, no murmurs, no rubs, no gallops RESPIRATORY:  Clear to auscultation without rales, wheezing or rhonchi  ABDOMEN: Soft, non-tender, non-distended MUSCULOSKELETAL:  No edema; No deformity  SKIN: Warm and dry LOWER EXTREMITIES: no swelling NEUROLOGIC:  Alert and  oriented x 3 PSYCHIATRIC:  Normal affect   ASSESSMENT:    1. Coronary artery disease of native artery of native heart with stable angina pectoris ([place])   2. PERIPHERAL VASCULAR DISEASE   3. Essential hypertension   4. Abnormal stress test   5. Status post coronary artery bypass graft    PLAN:    In order of problems listed above:  1. Coronary disease stable from that point review continue present management. 2. Peripheral vascular disease asymptomatic.  Decrease risk factors modifications. 3. Essential hypertension blood pressure well controlled continue present management. 4. Abnormal stress test, status post coronary artery bypass graft, cardiac catheterization last year after abnormal stress test showing no target lesion for intervention.  Medical therapy with good response. 5. Risk factors modification we talked in length again about the 5 that he need to take any insulin he needs to be active we need to keep good diet and he is trying to do that.  I congratulated him on weight loss and encouraged him to be very active.   Medication Adjustments/Labs and Tests Ordered: Current medicines are reviewed at length with the patient today.  Concerns regarding medicines are outlined above.  No orders of the defined types were placed in this encounter.  Medication changes: No orders of the defined types were placed in this encounter.   Signed, Park Liter, MD, [facility] 07/09/2019 10:27 AM    [place]

## 2019-07-12 ENCOUNTER — Encounter: Payer: Self-pay | Admitting: Orthopaedic Surgery

## 2019-07-12 ENCOUNTER — Ambulatory Visit (INDEPENDENT_AMBULATORY_CARE_PROVIDER_SITE_OTHER): Payer: Federal, State, Local not specified - PPO | Admitting: Orthopaedic Surgery

## 2019-07-12 ENCOUNTER — Ambulatory Visit: Payer: Self-pay

## 2019-07-12 VITALS — Ht 71.0 in | Wt 196.0 lb

## 2019-07-12 DIAGNOSIS — M25522 Pain in left elbow: Secondary | ICD-10-CM | POA: Diagnosis not present

## 2019-07-12 NOTE — Progress Notes (Signed)
Office Visit Note   Patient: Gene [name]           Date of Birth: April 19, 1956           MRN: [id number] Visit Date: 07/12/2019              Requested by: Mosie Lukes, MD [address] [place],  [place] [address] PCP: Mosie Lukes, MD   Assessment & Plan: Visit Diagnoses:  1. Pain in left elbow     Plan: Impression is left elbow contusion and traumatic bursitis.  Recommended ice and heat and compression as well as anti-inflammatories.  We will write write a work note for no driving for the next 3 weeks.  He will follow-up with [place] as needed.  He will call with any worsening symptoms.  Follow-Up Instructions: Return if symptoms worsen or fail to improve.   Orders:  Orders Placed This Encounter  Procedures  . XR Elbow Complete Left (3+View)   No orders of the defined types were placed in this encounter.     Procedures: No procedures performed   Clinical Data: No additional findings.   Subjective: Chief Complaint  Patient presents with  . Left Elbow - Injury    DOI 07/01/2019    HPI patient is a pleasant 63 year old right-hand-dominant gentleman who presents our clinic today with left elbow pain.  On 07/01/2019 while at work, he tripped over a pallet landing on his left elbow.  He was seen in urgent care setting where x-rays were obtained.  He states that these were inconclusive.  He has had pain to the posterior elbow since.  He noticed significant ecchymosis throughout following the immediate injury.  He has had swelling to the olecranon bursa as well.  No fevers or chills.  He is not taking any anti-inflammatories other than aspirin which he takes daily anyway for the pain.  He notes occasional numbness and tingling to his entire hand and all 5 fingers.  Review of Systems as detailed in HPI.  All others reviewed and are negative.   Objective: Vital Signs: Ht 5\' 11"  (1.803 m)   Wt 196 lb (88.9 kg)   BMI 27.34 kg/m   Physical Exam  well-developed well-nourished gentleman in no acute distress.  Alert oriented x3.  Ortho Exam examination of his left elbow reveals a moderate sized olecranon bursa.  He does have tenderness throughout the olecranon.  No tenderness to the radial head.  He does lack approximately 3 to 5 degrees of elbow extension.  He is unable to fully flex his elbow secondary to the pain.  No pain with supination or pronation.  Full strength with resisted elbow flexion.  He has approximately 4 out of 5 strength with resisted elbow extension.  He is neurovascular intact distally.  Specialty Comments:  No specialty comments available.  Imaging: Xr Elbow Complete Left (3+view)  Result Date: 07/12/2019 X-rays show a small bony fragment to the posterior lateral aspect    PMFS History: Patient Active Problem List   Diagnosis Date Noted  . Dog bite of calf, right, initial encounter 12/25/2018  . Angina pectoris ([place])   . Abnormal stress test 06/01/2018  . Status post coronary artery bypass graft 05/11/2018  . SOB (shortness of breath) on exertion 05/02/2018  . Obesity 06/24/2016  . Left knee pain 11/03/2015  . Psoriasis 05/03/2014  . Elevated PSA 05/03/2014  . Preventative health care 05/03/2014  . Insomnia 03/25/2013  . Anemia 11/10/2012  .  H/O tobacco use, presenting hazards to health 05/21/2009  . Peripheral neuropathy 09/10/2008  . FOOT PAIN, RIGHT 09/10/2008  . Diabetes mellitus type 2 in obese ([place]) 08/07/2008  . Hyperlipidemia, mixed 08/07/2008  . ALCOHOL ABUSE, EPISODIC 08/07/2008  . Essential hypertension 08/07/2008  . Coronary artery disease 08/07/2008  . PERIPHERAL VASCULAR DISEASE 08/07/2008  . GERD 08/07/2008  . COLONIC POLYPS, HX OF 08/07/2008   Past Medical History:  Diagnosis Date  . Anemia   . CAD (coronary artery disease)   . Chronic pain in right foot   . Diabetes ([place])   . Diabetes mellitus type II   . Diabetic neuropathy ([place])   . GERD (gastroesophageal reflux disease)    . H/O tobacco use, presenting hazards to health 05/21/2009   Qualifier: Diagnosis of  By: Wynona Luna  Last cigarette smoked May 30, 2015    . History of colonic polyps   . Hyperlipidemia   . Hypertension   . Insomnia 03/25/2013  . Myocardial infarction ([place])   . Neuromuscular disorder ([place])    NEUROPATHY  . Obesity 06/24/2016  . Plantar fasciitis   . Preventative health care 05/03/2014  . PVD (peripheral vascular disease) ([place])    PTA RLE 12/08 [place]  . TOBACCO USER 05/21/2009   Qualifier: Diagnosis of  By: Wynona Luna  Last cigarette smoked May 30, 2015      Family History  Problem Relation Age of Onset  . Coronary artery disease Mother   . Hypertension Mother   . Hyperlipidemia Father   . Aneurysm Father        brain  . Hyperlipidemia Unknown   . Hypertension Unknown   . Stroke Unknown   . Cancer Maternal Uncle   . Colon cancer Neg Hx   . Esophageal cancer Neg Hx   . Liver cancer Neg Hx   . Pancreatic cancer Neg Hx   . Rectal cancer Neg Hx   . Stomach cancer Neg Hx     Past Surgical History:  Procedure Laterality Date  . CORONARY ARTERY BYPASS GRAFT     200 SVG to diagonal, LIMA to LAD, SVG to LCX  . LEFT HEART CATH AND CORS/GRAFTS ANGIOGRAPHY N/A 06/08/2018   Procedure: LEFT HEART CATH AND CORS/GRAFTS ANGIOGRAPHY;  Surgeon: Belva Crome, MD;  Location: [facility];  Service: Cardiovascular;  Laterality: N/A;  . OTHER SURGICAL HISTORY     PAD surgery  . REVISION TOTAL KNEE ARTHROPLASTY     Social History   Occupational History  . Not on file  Tobacco Use  . Smoking status: Former Smoker    Packs/day: 0.50    Years: 40.00    Pack years: 20.00    Types: Cigarettes    Quit date: 05/04/2015    Years since quitting: 4.1  . Smokeless tobacco: Never Used  Substance and Sexual Activity  . Alcohol use: No    Alcohol/week: 0.0 standard drinks  . Drug use: No    Comment: rare  . Sexual activity: Yes    Comment: lives with wife, retired

## 2019-07-13 ENCOUNTER — Other Ambulatory Visit: Payer: Self-pay | Admitting: Cardiology

## 2019-07-22 ENCOUNTER — Other Ambulatory Visit: Payer: Self-pay | Admitting: Family Medicine

## 2019-07-25 ENCOUNTER — Telehealth: Payer: Self-pay

## 2019-07-25 NOTE — Telephone Encounter (Signed)
Received request for records as they are investigating a wc claim for this pt and need note to determine compensability. Faxed the 07/12/19 office and work note. Claim#[id number] DOI:07/01/19 [employer] [address] [place] [address]

## 2019-07-27 ENCOUNTER — Other Ambulatory Visit: Payer: Self-pay | Admitting: Family Medicine

## 2019-08-12 ENCOUNTER — Other Ambulatory Visit: Payer: Self-pay | Admitting: Family Medicine

## 2019-08-27 ENCOUNTER — Other Ambulatory Visit: Payer: Self-pay | Admitting: Cardiology

## 2019-09-08 ENCOUNTER — Other Ambulatory Visit: Payer: Self-pay | Admitting: Family Medicine

## 2019-09-09 ENCOUNTER — Other Ambulatory Visit: Payer: Self-pay | Admitting: Cardiology

## 2019-10-09 ENCOUNTER — Other Ambulatory Visit: Payer: Self-pay | Admitting: Cardiology

## 2019-11-07 ENCOUNTER — Encounter: Payer: Self-pay | Admitting: Physician Assistant

## 2019-11-07 ENCOUNTER — Ambulatory Visit: Payer: Federal, State, Local not specified - PPO | Admitting: Physician Assistant

## 2019-11-07 ENCOUNTER — Other Ambulatory Visit: Payer: Self-pay

## 2019-11-07 VITALS — Ht 71.0 in | Wt 196.0 lb

## 2019-11-07 DIAGNOSIS — G8929 Other chronic pain: Secondary | ICD-10-CM | POA: Diagnosis not present

## 2019-11-07 DIAGNOSIS — M25562 Pain in left knee: Secondary | ICD-10-CM

## 2019-11-07 NOTE — Progress Notes (Signed)
Office Visit Note   Patient: Gene [name]           Date of Birth: August 31, 1956           MRN: [id number] Visit Date: 11/07/2019              Requested by: Mosie Lukes, MD [address] [place],  [place] [address] PCP: Mosie Lukes, MD  Chief Complaint  Patient presents with  . Left Knee - Pain      HPI: This is a pleasant 63 year old gentleman with a history of left knee arthritis and feelings of instability.  He was given a hinged left knee brace several years ago and has done very well with that he would like a prescription for a new knee brace as the brace is worn and not functioning  Assessment & Plan: Visit Diagnoses: No diagnosis found.  Plan: He may follow-up as needed  Follow-Up Instructions: No follow-ups on file.   Ortho Exam  Patient is alert, oriented, no adenopathy, well-dressed, normal affect, normal respiratory effort. Left knee mild soft tissue swelling some tenderness with range of motion no erythema or cellulitis he did show me his current brace and the Velcro has completely worn out and torn and much of the fabric of the brace has torn away from the metal frame  Imaging: No results found. No images are attached to the encounter.  Labs: Lab Results  Component Value Date   HGBA1C 10.0 (H) 05/28/2019   HGBA1C 7.7 (H) 12/21/2018   HGBA1C 8.1 (H) 08/01/2018   REPTSTATUS 08/01/2008 FINAL 07/30/2008   CULT INSIGNIFICANT GROWTH 07/30/2008     Lab Results  Component Value Date   ALBUMIN 4.0 05/28/2019   ALBUMIN 4.2 12/21/2018   ALBUMIN 4.2 08/01/2018    Lab Results  Component Value Date   MG 2.0 07/29/2008   No results found for: VD25OH  No results found for: PREALBUMIN CBC EXTENDED Latest Ref Rng & Units 05/28/2019 12/21/2018 08/01/2018  WBC 4.0 - 10.5 K/uL 12.6(H) 8.6 7.1  RBC 4.22 - 5.81 Mil/uL 3.84(L) 4.04(L) 4.05(L)  HGB 13.0 - 17.0 g/dL 11.9(L) 12.3(L) 11.8(L)  HCT 39.0 - 52.0 % 35.6(L) 37.0(L) 35.6(L)  PLT 150.0 -  400.0 K/uL 355.0 384.0 343.0  NEUTROABS 1.4 - 7.7 K/uL - - -  LYMPHSABS 0.7 - 4.0 K/uL - - -     Body mass index is 27.34 kg/m.  Orders:  No orders of the defined types were placed in this encounter.  No orders of the defined types were placed in this encounter.    Procedures: No procedures performed  Clinical Data: No additional findings.  ROS:  All other systems negative, except as noted in the HPI. Review of Systems  Objective: Vital Signs: Ht 5\' 11"  (1.803 m)   Wt 196 lb (88.9 kg)   BMI 27.34 kg/m   Specialty Comments:  No specialty comments available.  PMFS History: Patient Active Problem List   Diagnosis Date Noted  . Dog bite of calf, right, initial encounter 12/25/2018  . Angina pectoris ([place])   . Abnormal stress test 06/01/2018  . Status post coronary artery bypass graft 05/11/2018  . SOB (shortness of breath) on exertion 05/02/2018  . Obesity 06/24/2016  . Left knee pain 11/03/2015  . Psoriasis 05/03/2014  . Elevated PSA 05/03/2014  . Preventative health care 05/03/2014  . Insomnia 03/25/2013  . Anemia 11/10/2012  . H/O tobacco use, presenting hazards to health 05/21/2009  .  Peripheral neuropathy 09/10/2008  . FOOT PAIN, RIGHT 09/10/2008  . Diabetes mellitus type 2 in obese ([place]) 08/07/2008  . Hyperlipidemia, mixed 08/07/2008  . ALCOHOL ABUSE, EPISODIC 08/07/2008  . Essential hypertension 08/07/2008  . Coronary artery disease 08/07/2008  . PERIPHERAL VASCULAR DISEASE 08/07/2008  . GERD 08/07/2008  . COLONIC POLYPS, HX OF 08/07/2008   Past Medical History:  Diagnosis Date  . Anemia   . CAD (coronary artery disease)   . Chronic pain in right foot   . Diabetes ([place])   . Diabetes mellitus type II   . Diabetic neuropathy ([place])   . GERD (gastroesophageal reflux disease)   . H/O tobacco use, presenting hazards to health 05/21/2009   Qualifier: Diagnosis of  By: Wynona Luna  Last cigarette smoked May 30, 2015    . History of colonic  polyps   . Hyperlipidemia   . Hypertension   . Insomnia 03/25/2013  . Myocardial infarction ([place])   . Neuromuscular disorder ([place])    NEUROPATHY  . Obesity 06/24/2016  . Plantar fasciitis   . Preventative health care 05/03/2014  . PVD (peripheral vascular disease) ([place])    PTA RLE 12/08 [place]  . TOBACCO USER 05/21/2009   Qualifier: Diagnosis of  By: Wynona Luna  Last cigarette smoked May 30, 2015      Family History  Problem Relation Age of Onset  . Coronary artery disease Mother   . Hypertension Mother   . Hyperlipidemia Father   . Aneurysm Father        brain  . Hyperlipidemia Unknown   . Hypertension Unknown   . Stroke Unknown   . Cancer Maternal Uncle   . Colon cancer Neg Hx   . Esophageal cancer Neg Hx   . Liver cancer Neg Hx   . Pancreatic cancer Neg Hx   . Rectal cancer Neg Hx   . Stomach cancer Neg Hx     Past Surgical History:  Procedure Laterality Date  . CORONARY ARTERY BYPASS GRAFT     200 SVG to diagonal, LIMA to LAD, SVG to LCX  . LEFT HEART CATH AND CORS/GRAFTS ANGIOGRAPHY N/A 06/08/2018   Procedure: LEFT HEART CATH AND CORS/GRAFTS ANGIOGRAPHY;  Surgeon: Belva Crome, MD;  Location: [facility];  Service: Cardiovascular;  Laterality: N/A;  . OTHER SURGICAL HISTORY     PAD surgery  . REVISION TOTAL KNEE ARTHROPLASTY     Social History   Occupational History  . Not on file  Tobacco Use  . Smoking status: Former Smoker    Packs/day: 0.50    Years: 40.00    Pack years: 20.00    Types: Cigarettes    Quit date: 05/04/2015    Years since quitting: 4.5  . Smokeless tobacco: Never Used  Substance and Sexual Activity  . Alcohol use: No    Alcohol/week: 0.0 standard drinks  . Drug use: No    Comment: rare  . Sexual activity: Yes    Comment: lives with wife, retired

## 2019-11-08 ENCOUNTER — Other Ambulatory Visit: Payer: Self-pay | Admitting: Family Medicine

## 2019-12-06 ENCOUNTER — Other Ambulatory Visit: Payer: Self-pay | Admitting: Cardiology

## 2020-01-08 ENCOUNTER — Other Ambulatory Visit: Payer: Self-pay | Admitting: Cardiology

## 2020-01-08 ENCOUNTER — Other Ambulatory Visit: Payer: Self-pay | Admitting: Family Medicine

## 2020-01-09 ENCOUNTER — Telehealth: Payer: Self-pay

## 2020-01-09 NOTE — Telephone Encounter (Signed)
ATORVASTTIN & LISINOPRIL given #15 for each until appt secured.  Pt has appt first available 02/04/20.  Please authorize remaining pills to be dispensed to pt.  [facility] on [address].

## 2020-01-09 NOTE — Telephone Encounter (Signed)
Please authorize those medications

## 2020-01-10 MED ORDER — ATORVASTATIN CALCIUM 40 MG PO TABS: ORAL_TABLET | ORAL | 0 refills | Status: DC

## 2020-01-10 MED ORDER — LISINOPRIL 20 MG PO TABS: 20.0000 mg | ORAL_TABLET | Freq: Every day | ORAL | 0 refills | Status: DC

## 2020-01-10 NOTE — Telephone Encounter (Signed)
Medications refilled

## 2020-01-16 IMAGING — DX DG CHEST 2V
2 series · 2 of 2 positions shown · non-contrast
Comparison: 07/29/2008

CLINICAL DATA: Preop.

EXAM:
CHEST - 2 VIEW

[chest pa]
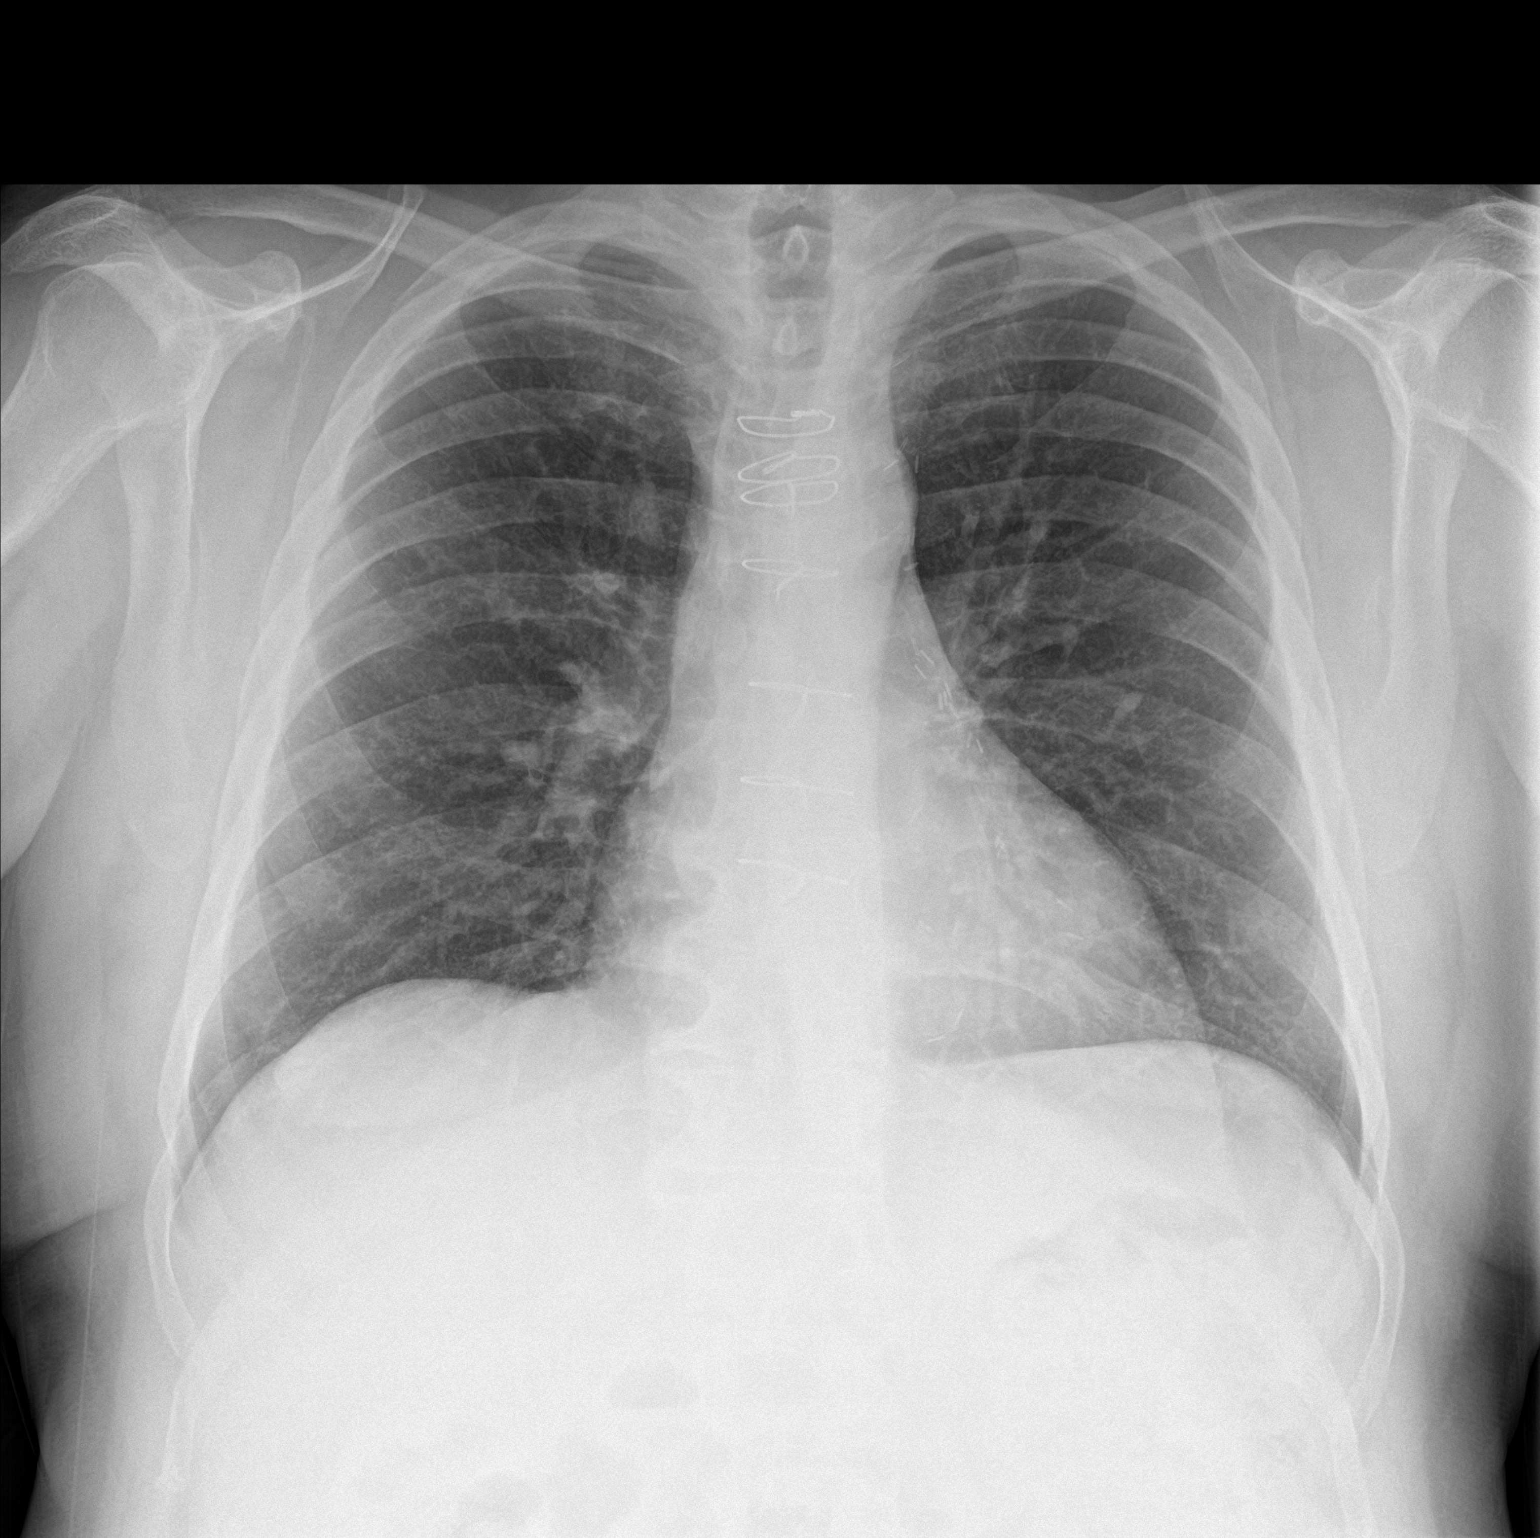

[chest lat]
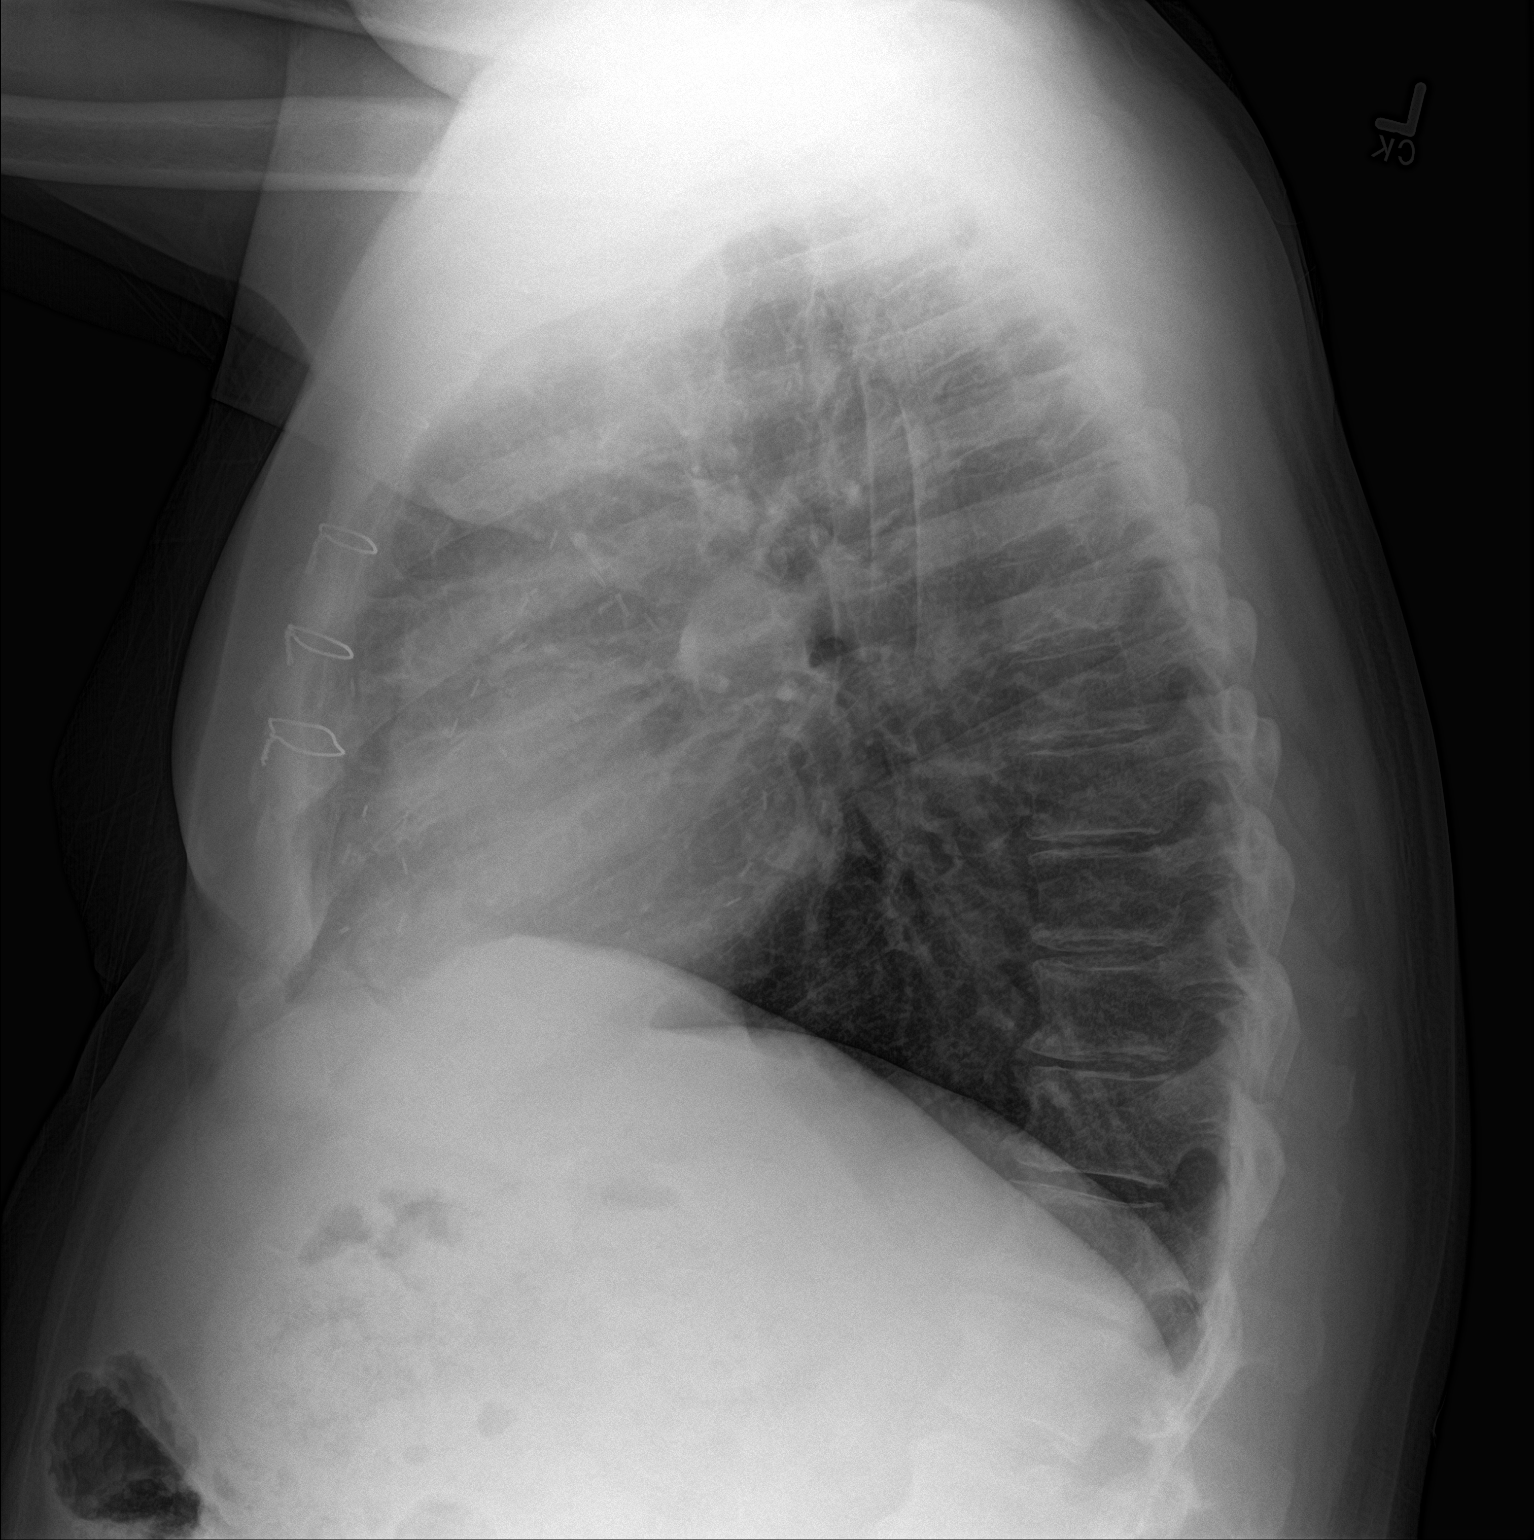

[2 of 2 positions shown; findings below may reference images not displayed]

FINDINGS: Sequelae of prior CABG are again identified. The cardiac silhouette
is upper limits of normal in size. The lungs are well inflated and
clear. No pleural effusion or pneumothorax is identified. Thoracic
spondylosis is noted.
IMPRESSION: No active cardiopulmonary disease.

## 2020-01-21 ENCOUNTER — Other Ambulatory Visit: Payer: Self-pay | Admitting: Family Medicine

## 2020-02-04 ENCOUNTER — Ambulatory Visit: Payer: Federal, State, Local not specified - PPO | Admitting: Cardiology

## 2020-02-04 ENCOUNTER — Other Ambulatory Visit: Payer: Self-pay

## 2020-02-04 ENCOUNTER — Encounter: Payer: Self-pay | Admitting: Cardiology

## 2020-02-04 VITALS — BP 128/70 | HR 73 | Ht 71.0 in | Wt 207.0 lb

## 2020-02-04 DIAGNOSIS — E669 Obesity, unspecified: Secondary | ICD-10-CM

## 2020-02-04 DIAGNOSIS — E1169 Type 2 diabetes mellitus with other specified complication: Secondary | ICD-10-CM

## 2020-02-04 DIAGNOSIS — I10 Essential (primary) hypertension: Secondary | ICD-10-CM

## 2020-02-04 DIAGNOSIS — I25118 Atherosclerotic heart disease of native coronary artery with other forms of angina pectoris: Secondary | ICD-10-CM

## 2020-02-04 NOTE — Progress Notes (Signed)
Cardiology Office Note:    Date:  02/04/2020   ID:  Gene [name], DOB 07-09-1956, MRN [id number]  PCP:  Mosie Lukes, MD  Cardiologist:  Jenne Campus, MD    Referring MD: Mosie Lukes, MD   No chief complaint on file. I am doing fine  History of Present Illness:    Gene [name] is a 64 y.o. male with past medical history significant for coronary artery disease, cardiac catheterization done in 2019 show completely occluded proximal and mid LAD also high grade obstruction with small and diffusely diseased diagonal once,, also diffusely diseased circumflex artery as well as obtuse marginal, right coronary artery got eccentric 50% narrowing, LIMA to distal LAD patent, SVG to diagonal present, SVG to circumflex marginal occluded.  Also diabetes which was poorly controlled, dyslipidemia, peripheral vascular disease with no critical narrowing based on latest study done in 2020.  Comes today to my office for follow-up of all he is doing well still busy working at home this time.  He used to work for [employer] but quit that.  He is busy doing all kind of projects at home. Denies have any chest pain tightness squeezing pressure burning chest. Does have dyspnea with exertion when he carry heavy object  Past Medical History:  Diagnosis Date  . Abnormal stress test 06/01/2018   Ischemia involving LAD territory  . Alcohol abuse, episodic 08/07/2008   Qualifier: Diagnosis of  By: Redmond Pulling MD, Frann Rider    . Anemia   . Angina pectoris ([place])   . CAD (coronary artery disease)   . Chronic pain in right foot   . COLONIC POLYPS, HX OF 08/07/2008   Qualifier: Diagnosis of  By: Lorrin Mais    . Coronary artery disease 08/07/2008   Qualifier: Diagnosis of  By: Lorrin Mais    . Diabetes ([place])   . Diabetes mellitus type 2 in obese ([place]) 08/07/2008   Qualifier: Diagnosis of  By: Lorrin Mais     . Diabetes mellitus type II   . Diabetic neuropathy ([place])   . Dog bite of calf,  right, initial encounter 12/25/2018  . Elevated PSA 05/03/2014  . Essential hypertension 08/07/2008   Qualifier: Diagnosis of  By: Lorrin Mais    . FOOT PAIN, RIGHT 09/10/2008   Qualifier: Diagnosis of  By: Redmond Pulling MD, Frann Rider    . GERD 08/07/2008   Qualifier: Diagnosis of  By: Lorrin Mais    . GERD (gastroesophageal reflux disease)   . H/O tobacco use, presenting hazards to health 05/21/2009   Qualifier: Diagnosis of  By: Wynona Luna  Last cigarette smoked May 30, 2015    . History of colonic polyps   . Hyperlipidemia   . Hyperlipidemia, mixed 08/07/2008   Qualifier: Diagnosis of  By: Lorrin Mais    . Hypertension   . Insomnia 03/25/2013  . Left knee pain 11/03/2015  . Myocardial infarction ([place])   . Neuromuscular disorder ([place])    NEUROPATHY  . Obesity 06/24/2016  . Peripheral neuropathy 09/10/2008   Qualifier: Diagnosis of  By: Redmond Pulling MD, Frann Rider    . PERIPHERAL VASCULAR DISEASE 08/07/2008   Qualifier: Diagnosis of  By: Lorrin Mais    . Plantar fasciitis   . Preventative health care 05/03/2014  . Psoriasis 05/03/2014  . PVD (peripheral vascular disease) ([place])    PTA RLE 12/08 [place]  . SOB (shortness of breath) on exertion 05/02/2018  . Status post coronary  artery bypass graft 05/11/2018   2000  . TOBACCO USER 05/21/2009   Qualifier: Diagnosis of  By: Wynona Luna  Last cigarette smoked May 30, 2015      Past Surgical History:  Procedure Laterality Date  . CORONARY ARTERY BYPASS GRAFT     200 SVG to diagonal, LIMA to LAD, SVG to LCX  . LEFT HEART CATH AND CORS/GRAFTS ANGIOGRAPHY N/A 06/08/2018   Procedure: LEFT HEART CATH AND CORS/GRAFTS ANGIOGRAPHY;  Surgeon: Belva Crome, MD;  Location: [facility];  Service: Cardiovascular;  Laterality: N/A;  . OTHER SURGICAL HISTORY     PAD surgery  . REVISION TOTAL KNEE ARTHROPLASTY      Current Medications: Current Meds  Medication Sig  . amLODipine (NORVASC) 10 MG tablet Take 1 tablet  by mouth once daily  . atorvastatin (LIPITOR) 40 MG tablet TAKE 1 TABLET BY MOUTH ONCE DAILY . APPOINTMENT REQUIRED FOR FUTURE REFILLS  . fish oil-omega-3 fatty acids 1000 MG capsule Take 1 g by mouth at bedtime.    . gabapentin (NEURONTIN) 600 MG tablet TAKE 1 TABLET BY MOUTH TWICE DAILY AND MAY TAKE AN ADDITIONAL TABLET AS NEEDED FOR SEVERE PAIN. MAX 3 TABLETS PER DAY  . glimepiride (AMARYL) 4 MG tablet Take 1 tablet by mouth twice daily  . Insulin Glargine (LANTUS SOLOSTAR) 100 UNIT/ML Solostar Pen INJECT 30-35 UNITS SUBCUTANEOUSLY ONCE DAILY AT 10 PM PER SLIDING SCALE  . Insulin Pen Needle (RELION PEN NEEDLES) 29G X 12MM MISC USE  ONCE DAILY IN THE EVENING  . labetalol (NORMODYNE) 200 MG tablet Take 1 tablet by mouth twice daily  . lisinopril (ZESTRIL) 20 MG tablet Take 1 tablet (20 mg total) by mouth daily. PLEASE CALL OFFICE TO SCHEDULE AN APPOINTMENT FOR FURTHER REFILLS  . metFORMIN (GLUCOPHAGE-XR) 500 MG 24 hr tablet Take 2 tablets by mouth twice daily  . Multiple Vitamins-Minerals (CENTRUM PO) Take 1 tablet by mouth daily.   . nitroGLYCERIN (NITROSTAT) 0.4 MG SL tablet Place 0.4 mg under the tongue every 5 (five) minutes as needed for chest pain.   Marland Kitchen omeprazole (PRILOSEC) 40 MG capsule Take 1 capsule by mouth once daily  . ranolazine (RANEXA) 500 MG 12 hr tablet Take 1 tablet by mouth twice daily  . Specialty Vitamins Products (MAGNESIUM, AMINO ACID CHELATE,) 133 MG tablet Take 1 tablet by mouth daily.     Allergies:   Patient has no known allergies.   Social History   Socioeconomic History  . Marital status: Married    Spouse name: Not on file  . Number of children: Not on file  . Years of education: Not on file  . Highest education level: Not on file  Occupational History  . Not on file  Tobacco Use  . Smoking status: Former Smoker    Packs/day: 0.50    Years: 40.00    Pack years: 20.00    Types: Cigarettes    Quit date: 05/04/2015    Years since quitting: 4.7  .  Smokeless tobacco: Never Used  Substance and Sexual Activity  . Alcohol use: No    Alcohol/week: 0.0 standard drinks  . Drug use: No    Comment: rare  . Sexual activity: Yes    Comment: lives with wife, retired   Other Topics Concern  . Not on file  Social History Narrative   Occupation: grocery clerk   Second marriage - 3 children from first marriage   Tobacco Use - Yes.  Alcohol Use - yes(rare)           Social Determinants of Health   Financial Resource Strain:   . Difficulty of Paying Living Expenses:   Food Insecurity:   . Worried About Charity fundraiser in the Last Year:   . Arboriculturist in the Last Year:   Transportation Needs:   . Film/video editor (Medical):   Marland Kitchen Lack of Transportation (Non-Medical):   Physical Activity:   . Days of Exercise per Week:   . Minutes of Exercise per Session:   Stress:   . Feeling of Stress :   Social Connections:   . Frequency of Communication with Friends and Family:   . Frequency of Social Gatherings with Friends and Family:   . Attends Religious Services:   . Active Member of Clubs or Organizations:   . Attends Archivist Meetings:   Marland Kitchen Marital Status:      Family History: The patient's family history includes Aneurysm in his father; Cancer in his maternal uncle; Coronary artery disease in his mother; Hyperlipidemia in his father and another family member; Hypertension in his mother and another family member; Stroke in an other family member. There is no history of Colon cancer, Esophageal cancer, Liver cancer, Pancreatic cancer, Rectal cancer, or Stomach cancer. ROS:   Please see the history of present illness.    All 14 point review of systems negative except as described per history of present illness  EKGs/Labs/Other Studies Reviewed:    Cardiac catheterization from 2019 showed:  Severe native vessel coronary artery disease with total occlusion of the proximal to mid LAD, high-grade obstruction and a  small and diffusely diseased diagonal #1, and severe diffuse disease throughout the mid to distal circumflex and obtuse marginal territory.  Left main is widely patent  Right coronary artery is dominant and contains eccentric 50% mid vessel narrowing unchanged compared to 10 years ago.  Also supplied to septal perforators mid LAD segment which is not fed retrograde by the distal LAD.  LIMA to the distal LAD  Saphenous vein graft to the diagonal  Occluded saphenous vein graft to the circumflex/marginal  LVEF 50% with anterolateral wall motion abnormality.  EF 50% with upper normal LVEDP.  Recommendations:   Medical therapy and aggressive secondary risk factor modification.  Current anatomy is not appropriate for surgical revascularization and there is no disease segment in which PCI has a role at the current time.  The native RCA could potentially be intervened upon if progression of disease in the future.  Guideline directed therapy should also be used with intention of LV function preservation.  Beta-blocker, ACE/sharp therapy, Spironolactone, etc. as appropriate.  Recent Labs: 05/28/2019: ALT 10; BUN 23; Creatinine, Ser 1.08; Hemoglobin 11.9; Platelets 355.0; Potassium 4.2; Sodium 139; TSH 0.89  Recent Lipid Panel    Component Value Date/Time   CHOL 84 05/28/2019 0856   TRIG 126.0 05/28/2019 0856   HDL 21.40 (L) 05/28/2019 0856   CHOLHDL 4 05/28/2019 0856   VLDL 25.2 05/28/2019 0856   LDLCALC 38 05/28/2019 0856    Physical Exam:    VS:  BP 128/70 (BP Location: Right Arm, Patient Position: Sitting, Cuff Size: Normal)   Pulse 73   Ht 5\' 11"  (1.803 m)   Wt 207 lb (93.9 kg)   SpO2 97%   BMI 28.87 kg/m     Wt Readings from Last 3 Encounters:  02/04/20 207 lb (93.9 kg)  11/07/19 196 lb (88.9  kg)  07/12/19 196 lb (88.9 kg)     GEN:  Well nourished, well developed in no acute distress HEENT: Normal NECK: No JVD; No carotid bruits LYMPHATICS: No  lymphadenopathy CARDIAC: RRR, no murmurs, no rubs, no gallops RESPIRATORY:  Clear to auscultation without rales, wheezing or rhonchi  ABDOMEN: Soft, non-tender, non-distended MUSCULOSKELETAL:  No edema; No deformity  SKIN: Warm and dry LOWER EXTREMITIES: no swelling NEUROLOGIC:  Alert and oriented x 3 PSYCHIATRIC:  Normal affect   ASSESSMENT:    1. Essential hypertension   2. Coronary artery disease of native artery of native heart with stable angina pectoris ([place])    PLAN:    In order of problems listed above:  8. Essential hypertension blood pressure seems to be well controlled continue present management. 9. Coronary artery disease on medical therapy will continue.  I did review cardiac catheterization with him again. 10. Peripheral vascular disease stable on appropriate medication which I will continue. 11. Diabetes which was uncontrolled.  I will check his hemoglobin A1c. 12. Dyslipidemia: Fasting lipid profile will be checked today   Medication Adjustments/Labs and Tests Ordered: Current medicines are reviewed at length with the patient today.  Concerns regarding medicines are outlined above.  No orders of the defined types were placed in this encounter.  Medication changes: No orders of the defined types were placed in this encounter.   Signed, Park Liter, MD, [facility] 02/04/2020 4:40 PM    [facility]

## 2020-02-04 NOTE — Patient Instructions (Signed)
Medication Instructions:  Your physician recommends that you continue on your current medications as directed. Please refer to the Current Medication list given to you today.  *If you need a refill on your cardiac medications before your next appointment, please call your pharmacy*   Lab Work: Your physician recommends that you return for lab work today: cmp, hemoglobin a1c, lipids   If you have labs (blood work) drawn today and your tests are completely normal, you will receive your results only by: Marland Kitchen MyChart Message (if you have MyChart) OR . A paper copy in the mail If you have any lab test that is abnormal or we need to change your treatment, we will call you to review the results.   Testing/Procedures: None.    Follow-Up: At [facility], you and your health needs are our priority.  As part of our continuing mission to provide you with exceptional heart care, we have created designated Provider Care Teams.  These Care Teams include your primary Cardiologist (physician) and Advanced Practice Providers (APPs -  Physician Assistants and Nurse Practitioners) who all work together to provide you with the care you need, when you need it.  We recommend signing up for the patient portal called "MyChart".  Sign up information is provided on this After Visit Summary.  MyChart is used to connect with patients for Virtual Visits (Telemedicine).  Patients are able to view lab/test results, encounter notes, upcoming appointments, etc.  Non-urgent messages can be sent to your provider as well.   To learn more about what you can do with MyChart, go to [email or web address].    Your next appointment:   4 month(s)  The format for your next appointment:   In Person  Provider:   Jenne Campus, MD   Other Instructions

## 2020-02-05 LAB — COMPREHENSIVE METABOLIC PANEL
ALT: 12 IU/L (ref 0–44)
AST: 17 IU/L (ref 0–40)
Albumin/Globulin Ratio: 1.8 (ref 1.2–2.2)
Albumin: 4.5 g/dL (ref 3.8–4.8)
Alkaline Phosphatase: 94 IU/L (ref 39–117)
BUN/Creatinine Ratio: 16 (ref 10–24)
BUN: 19 mg/dL (ref 8–27)
Bilirubin Total: 0.2 mg/dL (ref 0.0–1.2)
CO2: 24 mmol/L (ref 20–29)
Calcium: 9.1 mg/dL (ref 8.6–10.2)
Chloride: 100 mmol/L (ref 96–106)
Creatinine, Ser: 1.18 mg/dL (ref 0.76–1.27)
GFR calc Af Amer: 74 mL/min/{1.73_m2} (ref >59)
GFR calc non Af Amer: 64 mL/min/{1.73_m2} (ref >59)
Globulin, Total: 2.5 g/dL (ref 1.5–4.5)
Glucose: 145 mg/dL — ABNORMAL HIGH (ref 65–99)
Potassium: 4.9 mmol/L (ref 3.5–5.2)
Sodium: 140 mmol/L (ref 134–144)
Total Protein: 7 g/dL (ref 6.0–8.5)

## 2020-02-05 LAB — LIPID PANEL
Chol/HDL Ratio: 3.3 ratio (ref 0.0–5.0)
Cholesterol, Total: 101 mg/dL (ref 100–199)
HDL: 31 mg/dL — ABNORMAL LOW (ref >39)
LDL Chol Calc (NIH): 48 mg/dL (ref 0–99)
Triglycerides: 120 mg/dL (ref 0–149)
VLDL Cholesterol Cal: 22 mg/dL (ref 5–40)

## 2020-02-05 LAB — HEMOGLOBIN A1C
Est. average glucose Bld gHb Est-mCnc: 169 mg/dL
Hgb A1c MFr Bld: 7.5 % — ABNORMAL HIGH (ref 4.8–5.6)

## 2020-02-06 ENCOUNTER — Encounter: Payer: Self-pay | Admitting: *Deleted

## 2020-02-07 ENCOUNTER — Ambulatory Visit: Payer: Federal, State, Local not specified - PPO | Attending: Internal Medicine

## 2020-02-07 DIAGNOSIS — Z23 Encounter for immunization: Secondary | ICD-10-CM

## 2020-02-07 NOTE — Progress Notes (Signed)
   Covid-19 Vaccination Clinic  Name:  Gene [name]    MRN: [id number] DOB: February 20, 1956  02/07/2020  [name] was observed post Covid-19 immunization for 15 minutes without incident. He was provided with Vaccine Information Sheet and instruction to access the V-Safe system.   [name] was instructed to call 911 with any severe reactions post vaccine: Marland Kitchen Difficulty breathing  . Swelling of face and throat  . A fast heartbeat  . A bad rash all over body  . Dizziness and weakness   Immunizations Administered    Name Date Dose VIS Date Route   Pfizer COVID-19 Vaccine 02/07/2020 12:23 PM 0.3 mL 10/26/2019 Intramuscular   Manufacturer: [place]   Lot: [id number]   [place]: [id number]

## 2020-02-16 ENCOUNTER — Other Ambulatory Visit: Payer: Self-pay | Admitting: Cardiology

## 2020-02-16 ENCOUNTER — Other Ambulatory Visit: Payer: Self-pay | Admitting: Family Medicine

## 2020-02-17 ENCOUNTER — Other Ambulatory Visit: Payer: Self-pay | Admitting: Family Medicine

## 2020-02-17 ENCOUNTER — Other Ambulatory Visit: Payer: Self-pay | Admitting: Cardiology

## 2020-02-27 ENCOUNTER — Other Ambulatory Visit: Payer: Self-pay | Admitting: Family Medicine

## 2020-03-03 ENCOUNTER — Ambulatory Visit: Payer: Federal, State, Local not specified - PPO | Attending: Internal Medicine

## 2020-03-03 DIAGNOSIS — Z23 Encounter for immunization: Secondary | ICD-10-CM

## 2020-03-03 NOTE — Progress Notes (Signed)
   Covid-19 Vaccination Clinic  Name:  Gene [name]    MRN: [id number] DOB: 12-31-1955  03/03/2020  [name] was observed post Covid-19 immunization for 15 minutes without incident. He was provided with Vaccine Information Sheet and instruction to access the V-Safe system.   [name] was instructed to call 911 with any severe reactions post vaccine: Marland Kitchen Difficulty breathing  . Swelling of face and throat  . A fast heartbeat  . A bad rash all over body  . Dizziness and weakness   Immunizations Administered    Name Date Dose VIS Date Route   Pfizer COVID-19 Vaccine 03/03/2020 11:27 AM 0.3 mL 01/09/2019 Intramuscular   Manufacturer: [place]   Lot: [id number]   [place]: [id number]

## 2020-03-19 ENCOUNTER — Other Ambulatory Visit: Payer: Self-pay | Admitting: Family Medicine

## 2020-04-19 ENCOUNTER — Other Ambulatory Visit: Payer: Self-pay | Admitting: Family Medicine

## 2020-05-20 ENCOUNTER — Ambulatory Visit: Payer: Federal, State, Local not specified - PPO | Admitting: Family Medicine

## 2020-05-20 ENCOUNTER — Other Ambulatory Visit: Payer: Self-pay

## 2020-05-20 ENCOUNTER — Encounter: Payer: Self-pay | Admitting: Family Medicine

## 2020-05-20 VITALS — BP 121/58 | HR 65 | Temp 97.9°F | Resp 12 | Ht 71.0 in | Wt 206.2 lb

## 2020-05-20 DIAGNOSIS — I25118 Atherosclerotic heart disease of native coronary artery with other forms of angina pectoris: Secondary | ICD-10-CM | POA: Diagnosis not present

## 2020-05-20 DIAGNOSIS — E782 Mixed hyperlipidemia: Secondary | ICD-10-CM

## 2020-05-20 DIAGNOSIS — I10 Essential (primary) hypertension: Secondary | ICD-10-CM

## 2020-05-20 DIAGNOSIS — D649 Anemia, unspecified: Secondary | ICD-10-CM

## 2020-05-20 DIAGNOSIS — Z23 Encounter for immunization: Secondary | ICD-10-CM

## 2020-05-20 DIAGNOSIS — E669 Obesity, unspecified: Secondary | ICD-10-CM | POA: Diagnosis not present

## 2020-05-20 DIAGNOSIS — E1169 Type 2 diabetes mellitus with other specified complication: Secondary | ICD-10-CM

## 2020-05-20 LAB — COMPREHENSIVE METABOLIC PANEL
ALT: 11 U/L (ref 0–53)
AST: 15 U/L (ref 0–37)
Albumin: 4.4 g/dL (ref 3.5–5.2)
Alkaline Phosphatase: 84 U/L (ref 39–117)
BUN: 20 mg/dL (ref 6–23)
CO2: 29 mEq/L (ref 19–32)
Calcium: 9.7 mg/dL (ref 8.4–10.5)
Chloride: 102 mEq/L (ref 96–112)
Creatinine, Ser: 1.04 mg/dL (ref 0.40–1.50)
GFR: 71.6 mL/min (ref >60.00)
Glucose, Bld: 115 mg/dL — ABNORMAL HIGH (ref 70–99)
Potassium: 4.9 mEq/L (ref 3.5–5.1)
Sodium: 140 mEq/L (ref 135–145)
Total Bilirubin: 0.4 mg/dL (ref 0.2–1.2)
Total Protein: 6.7 g/dL (ref 6.0–8.3)

## 2020-05-20 LAB — CBC
HCT: 33.5 % — ABNORMAL LOW (ref 39.0–52.0)
Hemoglobin: 11.4 g/dL — ABNORMAL LOW (ref 13.0–17.0)
MCHC: 33.9 g/dL (ref 30.0–36.0)
MCV: 92.2 fl (ref 78.0–100.0)
Platelets: 387 10*3/uL (ref 150.0–400.0)
RBC: 3.63 Mil/uL — ABNORMAL LOW (ref 4.22–5.81)
RDW: 14.1 % (ref 11.5–15.5)
WBC: 7.8 10*3/uL (ref 4.0–10.5)

## 2020-05-20 LAB — TSH: TSH: 1.48 u[IU]/mL (ref 0.35–4.50)

## 2020-05-20 LAB — LIPID PANEL
Cholesterol: 82 mg/dL (ref 0–200)
HDL: 26.3 mg/dL — ABNORMAL LOW (ref >39.00)
LDL Cholesterol: 36 mg/dL (ref 0–99)
NonHDL: 55.44
Total CHOL/HDL Ratio: 3
Triglycerides: 99 mg/dL (ref 0.0–149.0)
VLDL: 19.8 mg/dL (ref 0.0–40.0)

## 2020-05-20 LAB — HEMOGLOBIN A1C: Hgb A1c MFr Bld: 7.8 % — ABNORMAL HIGH (ref 4.6–6.5)

## 2020-05-20 MED ORDER — RELION PEN NEEDLES 29G X 12MM MISC: 1 refills | Status: DC

## 2020-05-20 MED ORDER — GABAPENTIN 600 MG PO TABS: ORAL_TABLET | ORAL | 1 refills | Status: DC

## 2020-05-20 MED ORDER — GLIMEPIRIDE 4 MG PO TABS: ORAL_TABLET | ORAL | 1 refills | Status: DC

## 2020-05-20 MED ORDER — LANTUS SOLOSTAR 100 UNIT/ML ~~LOC~~ SOPN: PEN_INJECTOR | SUBCUTANEOUS | 1 refills | Status: DC

## 2020-05-20 MED ORDER — LABETALOL HCL 200 MG PO TABS: 200.0000 mg | ORAL_TABLET | Freq: Two times a day (BID) | ORAL | 1 refills | Status: DC

## 2020-05-20 MED ORDER — METFORMIN HCL ER 500 MG PO TB24: 1000.0000 mg | ORAL_TABLET | Freq: Two times a day (BID) | ORAL | 1 refills | Status: DC

## 2020-05-20 MED ORDER — AMLODIPINE BESYLATE 10 MG PO TABS: 10.0000 mg | ORAL_TABLET | Freq: Every day | ORAL | 1 refills | Status: DC

## 2020-05-20 MED ORDER — OMEPRAZOLE 40 MG PO CPDR: 40.0000 mg | DELAYED_RELEASE_CAPSULE | Freq: Every day | ORAL | 1 refills | Status: DC

## 2020-05-20 NOTE — Progress Notes (Signed)
Subjective:    Patient ID: Gene [name], male    DOB: July 08, 1956, 64 y.o.   MRN: [id number]  Chief Complaint  Patient presents with  . Follow-up    HPI Patient is in today for follow up on chronic medical concerns. No recent febrile illness or hospitalizations. He is back to work at [employer] and is doing well. He believes he will be able to stop working in about 2 years once he is able to stop paying alimony. He is getting 7000 steps  Has not been checking his sugars but he feels well. He is using his insulin well. Denies CP/palp/SOB/HA/congestion/fevers/GI or GU c/o. Taking meds as prescribed  Past Medical History:  Diagnosis Date  . Abnormal stress test 06/01/2018   Ischemia involving LAD territory  . Alcohol abuse, episodic 08/07/2008   Qualifier: Diagnosis of  By: Redmond Pulling MD, Frann Rider    . Anemia   . Angina pectoris ([place])   . CAD (coronary artery disease)   . Chronic pain in right foot   . COLONIC POLYPS, HX OF 08/07/2008   Qualifier: Diagnosis of  By: Lorrin Mais    . Coronary artery disease 08/07/2008   Qualifier: Diagnosis of  By: Lorrin Mais    . Diabetes ([place])   . Diabetes mellitus type 2 in obese ([place]) 08/07/2008   Qualifier: Diagnosis of  By: Lorrin Mais     . Diabetes mellitus type II   . Diabetic neuropathy ([place])   . Dog bite of calf, right, initial encounter 12/25/2018  . Elevated PSA 05/03/2014  . Essential hypertension 08/07/2008   Qualifier: Diagnosis of  By: Lorrin Mais    . FOOT PAIN, RIGHT 09/10/2008   Qualifier: Diagnosis of  By: Redmond Pulling MD, Frann Rider    . GERD 08/07/2008   Qualifier: Diagnosis of  By: Lorrin Mais    . GERD (gastroesophageal reflux disease)   . H/O tobacco use, presenting hazards to health 05/21/2009   Qualifier: Diagnosis of  By: Wynona Luna  Last cigarette smoked May 30, 2015    . History of colonic polyps   . Hyperlipidemia   . Hyperlipidemia, mixed 08/07/2008   Qualifier: Diagnosis of   By: Lorrin Mais    . Hypertension   . Insomnia 03/25/2013  . Left knee pain 11/03/2015  . Myocardial infarction ([place])   . Neuromuscular disorder ([place])    NEUROPATHY  . Obesity 06/24/2016  . Peripheral neuropathy 09/10/2008   Qualifier: Diagnosis of  By: Redmond Pulling MD, Frann Rider    . PERIPHERAL VASCULAR DISEASE 08/07/2008   Qualifier: Diagnosis of  By: Lorrin Mais    . Plantar fasciitis   . Preventative health care 05/03/2014  . Psoriasis 05/03/2014  . PVD (peripheral vascular disease) ([place])    PTA RLE 12/08 [place]  . SOB (shortness of breath) on exertion 05/02/2018  . Status post coronary artery bypass graft 05/11/2018   2000  . TOBACCO USER 05/21/2009   Qualifier: Diagnosis of  By: Wynona Luna  Last cigarette smoked May 30, 2015      Past Surgical History:  Procedure Laterality Date  . CORONARY ARTERY BYPASS GRAFT     200 SVG to diagonal, LIMA to LAD, SVG to LCX  . LEFT HEART CATH AND CORS/GRAFTS ANGIOGRAPHY N/A 06/08/2018   Procedure: LEFT HEART CATH AND CORS/GRAFTS ANGIOGRAPHY;  Surgeon: Belva Crome, MD;  Location: [facility];  Service: Cardiovascular;  Laterality:  N/A;  . OTHER SURGICAL HISTORY     PAD surgery  . REVISION TOTAL KNEE ARTHROPLASTY      Family History  Problem Relation Age of Onset  . Coronary artery disease Mother   . Hypertension Mother   . Hyperlipidemia Father   . Aneurysm Father        brain  . Hyperlipidemia Other   . Hypertension Other   . Stroke Other   . Cancer Maternal Uncle   . Colon cancer Neg Hx   . Esophageal cancer Neg Hx   . Liver cancer Neg Hx   . Pancreatic cancer Neg Hx   . Rectal cancer Neg Hx   . Stomach cancer Neg Hx     Social History   Socioeconomic History  . Marital status: Married    Spouse name: Not on file  . Number of children: Not on file  . Years of education: Not on file  . Highest education level: Not on file  Occupational History  . Not on file  Tobacco Use  . Smoking status:  Former Smoker    Packs/day: 0.50    Years: 40.00    Pack years: 20.00    Types: Cigarettes    Quit date: 05/04/2015    Years since quitting: 5.0  . Smokeless tobacco: Never Used  Vaping Use  . Vaping Use: Never used  Substance and Sexual Activity  . Alcohol use: No    Alcohol/week: 0.0 standard drinks  . Drug use: No    Comment: rare  . Sexual activity: Yes    Comment: lives with wife, retired   Other Topics Concern  . Not on file  Social History Narrative   Occupation: grocery clerk   Second marriage - 3 children from first marriage   Tobacco Use - Yes.     Alcohol Use - yes(rare)           Social Determinants of Health   Financial Resource Strain:   . Difficulty of Paying Living Expenses:   Food Insecurity:   . Worried About Charity fundraiser in the Last Year:   . Arboriculturist in the Last Year:   Transportation Needs:   . Film/video editor (Medical):   Marland Kitchen Lack of Transportation (Non-Medical):   Physical Activity:   . Days of Exercise per Week:   . Minutes of Exercise per Session:   Stress:   . Feeling of Stress :   Social Connections:   . Frequency of Communication with Friends and Family:   . Frequency of Social Gatherings with Friends and Family:   . Attends Religious Services:   . Active Member of Clubs or Organizations:   . Attends Archivist Meetings:   Marland Kitchen Marital Status:   Intimate Partner Violence:   . Fear of Current or Ex-Partner:   . Emotionally Abused:   Marland Kitchen Physically Abused:   . Sexually Abused:     Outpatient Medications Prior to Visit  Medication Sig Dispense Refill  . atorvastatin (LIPITOR) 40 MG tablet TAKE 1 TABLET BY MOUTH ONCE DAILY . 30 tablet 5  . fish oil-omega-3 fatty acids 1000 MG capsule Take 1 g by mouth at bedtime.      Marland Kitchen lisinopril (ZESTRIL) 20 MG tablet Take 1 tablet (20 mg total) by mouth daily. 30 tablet 5  . Multiple Vitamins-Minerals (CENTRUM PO) Take 1 tablet by mouth daily.     . nitroGLYCERIN  (NITROSTAT) 0.4 MG SL tablet Place 0.4  mg under the tongue every 5 (five) minutes as needed for chest pain.     . ranolazine (RANEXA) 500 MG 12 hr tablet Take 1 tablet by mouth twice daily 180 tablet 1  . Specialty Vitamins Products (MAGNESIUM, AMINO ACID CHELATE,) 133 MG tablet Take 1 tablet by mouth daily.    Marland Kitchen amLODipine (NORVASC) 10 MG tablet Take 1 tablet by mouth once daily 90 tablet 0  . gabapentin (NEURONTIN) 600 MG tablet TAKE 1 TABLET BY MOUTH TWICE DAILY AND MAY TAKE 1 ADDITIONAL TABLET AS NEEDED FOR SEVERE PAIN . DO NOT EXCEED 3 PER 24 HOURS 90 tablet 0  . glimepiride (AMARYL) 4 MG tablet TAKE 1 TABLET BY MOUTH TWICE DAILY . APPOINTMENT REQUIRED FOR FUTURE REFILLS 60 tablet 0  . insulin glargine (LANTUS SOLOSTAR) 100 UNIT/ML Solostar Pen INJECT 30 TO 35 UNITS SUBCUTANEOUSLY ONCE DAILY AT  10  PM  PER  SLIDING  SCALE 15 mL 0  . Insulin Pen Needle (RELION PEN NEEDLES) 29G X 12MM MISC USE  ONCE DAILY IN THE EVENING 100 each 5  . labetalol (NORMODYNE) 200 MG tablet Take 1 tablet (200 mg total) by mouth 2 (two) times daily. 60 tablet 0  . metFORMIN (GLUCOPHAGE-XR) 500 MG 24 hr tablet Take 2 tablets by mouth twice daily 120 tablet 0  . omeprazole (PRILOSEC) 40 MG capsule Take 1 capsule by mouth once daily 90 capsule 0   No facility-administered medications prior to visit.    No Known Allergies  Review of Systems  Constitutional: Negative for fever and malaise/fatigue.  HENT: Negative for congestion.   Eyes: Negative for blurred vision.  Respiratory: Negative for shortness of breath.   Cardiovascular: Negative for chest pain, palpitations and leg swelling.  Gastrointestinal: Negative for abdominal pain, blood in stool and nausea.  Genitourinary: Negative for dysuria and frequency.  Musculoskeletal: Negative for falls.  Skin: Negative for rash.  Neurological: Negative for dizziness, loss of consciousness and headaches.  Endo/Heme/Allergies: Negative for environmental allergies.    Psychiatric/Behavioral: Negative for depression. The patient is not nervous/anxious.        Objective:    Physical Exam Vitals and nursing note reviewed.  Constitutional:      General: He is not in acute distress.    Appearance: He is well-developed.  HENT:     Head: Normocephalic and atraumatic.     Nose: Nose normal.  Eyes:     General:        Right eye: No discharge.        Left eye: No discharge.  Cardiovascular:     Rate and Rhythm: Normal rate and regular rhythm.     Heart sounds: No murmur heard.   Pulmonary:     Effort: Pulmonary effort is normal.     Breath sounds: Normal breath sounds.  Abdominal:     General: Bowel sounds are normal.     Palpations: Abdomen is soft.     Tenderness: There is no abdominal tenderness.  Musculoskeletal:     Cervical back: Normal range of motion and neck supple.  Skin:    General: Skin is warm and dry.  Neurological:     Mental Status: He is alert and oriented to person, place, and time.     BP (!) 121/58 (BP Location: Left Arm, Cuff Size: Normal)   Pulse 65   Temp 97.9 F (36.6 C) (Temporal)   Resp 12   Ht 5\' 11"  (1.803 m)   Wt 206 lb 3.2 oz (  93.5 kg)   SpO2 100%   BMI 28.76 kg/m  Wt Readings from Last 3 Encounters:  05/20/20 206 lb 3.2 oz (93.5 kg)  02/04/20 207 lb (93.9 kg)  11/07/19 196 lb (88.9 kg)    Diabetic Foot Exam - Simple   No data filed     Lab Results  Component Value Date   WBC 7.8 05/20/2020   HGB 11.4 (L) 05/20/2020   HCT 33.5 (L) 05/20/2020   PLT 387.0 05/20/2020   GLUCOSE 115 (H) 05/20/2020   CHOL 82 05/20/2020   TRIG 99.0 05/20/2020   HDL 26.30 (L) 05/20/2020   LDLCALC 36 05/20/2020   ALT 11 05/20/2020   AST 15 05/20/2020   NA 140 05/20/2020   K 4.9 05/20/2020   CL 102 05/20/2020   CREATININE 1.04 05/20/2020   BUN 20 05/20/2020   CO2 29 05/20/2020   TSH 1.48 05/20/2020   PSA 7.99 (H) 05/28/2019   INR 1.1 07/31/2008   HGBA1C 7.8 (H) 05/20/2020   MICROALBUR 8.9 (H) 05/02/2018     Lab Results  Component Value Date   TSH 1.48 05/20/2020   Lab Results  Component Value Date   WBC 7.8 05/20/2020   HGB 11.4 (L) 05/20/2020   HCT 33.5 (L) 05/20/2020   MCV 92.2 05/20/2020   PLT 387.0 05/20/2020   Lab Results  Component Value Date   NA 140 05/20/2020   K 4.9 05/20/2020   CO2 29 05/20/2020   GLUCOSE 115 (H) 05/20/2020   BUN 20 05/20/2020   CREATININE 1.04 05/20/2020   BILITOT 0.4 05/20/2020   ALKPHOS 84 05/20/2020   AST 15 05/20/2020   ALT 11 05/20/2020   PROT 6.7 05/20/2020   ALBUMIN 4.4 05/20/2020   CALCIUM 9.7 05/20/2020   ANIONGAP 14 06/08/2018   GFR 71.60 05/20/2020   Lab Results  Component Value Date   CHOL 82 05/20/2020   Lab Results  Component Value Date   HDL 26.30 (L) 05/20/2020   Lab Results  Component Value Date   LDLCALC 36 05/20/2020   Lab Results  Component Value Date   TRIG 99.0 05/20/2020   Lab Results  Component Value Date   CHOLHDL 3 05/20/2020   Lab Results  Component Value Date   HGBA1C 7.8 (H) 05/20/2020       Assessment & Plan:   Problem List Items Addressed This Visit    Diabetes mellitus type 2 in obese ([place]) - Primary    hgba1c acceptable, minimize simple carbs. Increase exercise as tolerated. Continue current meds      Relevant Medications   glimepiride (AMARYL) 4 MG tablet   metFORMIN (GLUCOPHAGE-XR) 500 MG 24 hr tablet   insulin glargine (LANTUS SOLOSTAR) 100 UNIT/ML Solostar Pen   Other Relevant Orders   Hemoglobin A1c (Completed)   Hyperlipidemia, mixed    Encouraged heart healthy diet, increase exercise, avoid trans fats, consider a krill oil cap daily      Relevant Medications   amLODipine (NORVASC) 10 MG tablet   labetalol (NORMODYNE) 200 MG tablet   Other Relevant Orders   Lipid panel (Completed)   Essential hypertension    Well controlled, no changes to meds. Encouraged heart healthy diet such as the DASH diet and exercise as tolerated.       Relevant Medications   amLODipine  (NORVASC) 10 MG tablet   labetalol (NORMODYNE) 200 MG tablet   Other Relevant Orders   CBC (Completed)   Comprehensive metabolic panel (Completed)   TSH (Completed)  Coronary artery disease    Asymptomatic and doing well at his new job at [employer].       Relevant Medications   amLODipine (NORVASC) 10 MG tablet   labetalol (NORMODYNE) 200 MG tablet   Anemia   Relevant Orders   Ambulatory referral to Gastroenterology   Obesity    Encouraged DASH diet, decrease po intake and increase exercise as tolerated. Needs 7-8 hours of sleep nightly. Avoid trans fats, eat small, frequent meals every 4-5 hours with lean proteins, complex carbs and healthy fats. Minimize simple carbs      Relevant Medications   glimepiride (AMARYL) 4 MG tablet   metFORMIN (GLUCOPHAGE-XR) 500 MG 24 hr tablet   insulin glargine (LANTUS SOLOSTAR) 100 UNIT/ML Solostar Pen    Other Visit Diagnoses    Need for pneumococcal vaccination       Relevant Orders   Pneumococcal polysaccharide vaccine 23-valent greater than or equal to 2yo subcutaneous/IM (Completed)      I have changed [name] "Jeff"'s Insulin Pen Needle to ReliOn Pen Needles. I have also changed his amLODipine, glimepiride, metFORMIN, omeprazole, and gabapentin. I am also having him maintain his Multiple Vitamins-Minerals (CENTRUM PO), nitroGLYCERIN, fish oil-omega-3 fatty acids, magnesium (amino acid chelate), lisinopril, atorvastatin, ranolazine, labetalol, and Lantus SoloStar.  Meds ordered this encounter  Medications  . amLODipine (NORVASC) 10 MG tablet    Sig: Take 1 tablet (10 mg total) by mouth daily.    Dispense:  90 tablet    Refill:  1  . glimepiride (AMARYL) 4 MG tablet    Sig: TAKE 1 TABLET BY MOUTH TWICE DAILY .    Dispense:  180 tablet    Refill:  1  . labetalol (NORMODYNE) 200 MG tablet    Sig: Take 1 tablet (200 mg total) by mouth 2 (two) times daily.    Dispense:  180 tablet    Refill:  1  . metFORMIN  (GLUCOPHAGE-XR) 500 MG 24 hr tablet    Sig: Take 2 tablets (1,000 mg total) by mouth 2 (two) times daily.    Dispense:  360 tablet    Refill:  1  . omeprazole (PRILOSEC) 40 MG capsule    Sig: Take 1 capsule (40 mg total) by mouth daily.    Dispense:  90 capsule    Refill:  1  . insulin glargine (LANTUS SOLOSTAR) 100 UNIT/ML Solostar Pen    Sig: INJECT 30 TO 35 UNITS SUBCUTANEOUSLY ONCE DAILY AT  10  PM  PER  SLIDING  SCALE    Dispense:  30 mL    Refill:  1  . gabapentin (NEURONTIN) 600 MG tablet    Sig: Take 1 tab twice daily and may take 1 additional tab as needed for severe pain.  Do not exceed 3 tabs per 24 hours.    Dispense:  270 tablet    Refill:  1  . Insulin Pen Needle (RELION PEN NEEDLES) 29G X 12MM MISC    Sig: Use once daily in the evening    Dispense:  100 each    Refill:  1     Penni Homans, MD

## 2020-05-20 NOTE — Assessment & Plan Note (Signed)
Encouraged DASH diet, decrease po intake and increase exercise as tolerated. Needs 7-8 hours of sleep nightly. Avoid trans fats, eat small, frequent meals every 4-5 hours with lean proteins, complex carbs and healthy fats. Minimize simple carbs 

## 2020-05-20 NOTE — Assessment & Plan Note (Signed)
Asymptomatic and doing well at his new job at [employer].

## 2020-05-20 NOTE — Assessment & Plan Note (Signed)
Well controlled, no changes to meds. Encouraged heart healthy diet such as the DASH diet and exercise as tolerated.  °

## 2020-05-20 NOTE — Assessment & Plan Note (Signed)
hgba1c acceptable, minimize simple carbs. Increase exercise as tolerated. Continue current meds 

## 2020-05-20 NOTE — Assessment & Plan Note (Signed)
Encouraged heart healthy diet, increase exercise, avoid trans fats, consider a krill oil cap daily 

## 2020-05-20 NOTE — Patient Instructions (Signed)

## 2020-06-16 ENCOUNTER — Encounter: Payer: Self-pay | Admitting: Cardiology

## 2020-06-16 ENCOUNTER — Other Ambulatory Visit: Payer: Self-pay

## 2020-06-16 ENCOUNTER — Ambulatory Visit: Payer: Federal, State, Local not specified - PPO | Admitting: Cardiology

## 2020-06-16 VITALS — BP 146/70 | HR 72 | Ht 71.0 in | Wt 204.8 lb

## 2020-06-16 DIAGNOSIS — E782 Mixed hyperlipidemia: Secondary | ICD-10-CM

## 2020-06-16 DIAGNOSIS — I25118 Atherosclerotic heart disease of native coronary artery with other forms of angina pectoris: Secondary | ICD-10-CM

## 2020-06-16 DIAGNOSIS — I429 Cardiomyopathy, unspecified: Secondary | ICD-10-CM | POA: Diagnosis not present

## 2020-06-16 DIAGNOSIS — I10 Essential (primary) hypertension: Secondary | ICD-10-CM

## 2020-06-16 DIAGNOSIS — Z951 Presence of aortocoronary bypass graft: Secondary | ICD-10-CM

## 2020-06-16 NOTE — Patient Instructions (Signed)
Medication Instructions:  °Your physician recommends that you continue on your current medications as directed. Please refer to the Current Medication list given to you today. ° °*If you need a refill on your cardiac medications before your next appointment, please call your pharmacy* ° ° °Lab Work: °None. ° °If you have labs (blood work) drawn today and your tests are completely normal, you will receive your results only by: °• MyChart Message (if you have MyChart) OR °• A paper copy in the mail °If you have any lab test that is abnormal or we need to change your treatment, we will call you to review the results. ° ° °Testing/Procedures: °Your physician has requested that you have an echocardiogram. Echocardiography is a painless test that uses sound waves to create images of your heart. It provides your doctor with information about the size and shape of your heart and how well your heart’s chambers and valves are working. This procedure takes approximately one hour. There are no restrictions for this procedure. ° ° ° ° °Follow-Up: °At [facility], you and your health needs are our priority.  As part of our continuing mission to provide you with exceptional heart care, we have created designated Provider Care Teams.  These Care Teams include your primary Cardiologist (physician) and Advanced Practice Providers (APPs -  Physician Assistants and Nurse Practitioners) who all work together to provide you with the care you need, when you need it. ° °We recommend signing up for the patient portal called "MyChart".  Sign up information is provided on this After Visit Summary.  MyChart is used to connect with patients for Virtual Visits (Telemedicine).  Patients are able to view lab/test results, encounter notes, upcoming appointments, etc.  Non-urgent messages can be sent to your provider as well.   °To learn more about what you can do with MyChart, go to [email or web address].   ° °Your next appointment:   °6  month(s) ° °The format for your next appointment:   °In Person ° °Provider:   °Robert Krasowski, MD ° ° °Other Instructions ° ° °Echocardiogram °An echocardiogram is a procedure that uses painless sound waves (ultrasound) to produce an image of the heart. Images from an echocardiogram can provide important information about: °· Signs of coronary artery disease (CAD). °· Aneurysm detection. An aneurysm is a weak or damaged part of an artery wall that bulges out from the normal force of blood pumping through the body. °· Heart size and shape. Changes in the size or shape of the heart can be associated with certain conditions, including heart failure, aneurysm, and CAD. °· Heart muscle function. °· Heart valve function. °· Signs of a past heart attack. °· Fluid buildup around the heart. °· Thickening of the heart muscle. °· A tumor or infectious growth around the heart valves. °Tell a health care provider about: °· Any allergies you have. °· All medicines you are taking, including vitamins, herbs, eye drops, creams, and over-the-counter medicines. °· Any blood disorders you have. °· Any surgeries you have had. °· Any medical conditions you have. °· Whether you are pregnant or may be pregnant. °What are the risks? °Generally, this is a safe procedure. However, problems may occur, including: °· Allergic reaction to dye (contrast) that may be used during the procedure. °What happens before the procedure? °No specific preparation is needed. You may eat and drink normally. °What happens during the procedure? ° °· An IV tube may be inserted into one of your veins. °· You may   receive contrast through this tube. A contrast is an injection that improves the quality of the pictures from your heart. °· A gel will be applied to your chest. °· A wand-like tool (transducer) will be moved over your chest. The gel will help to transmit the sound waves from the transducer. °· The sound waves will harmlessly bounce off of your heart to  allow the heart images to be captured in real-time motion. The images will be recorded on a computer. °The procedure may vary among health care providers and hospitals. °What happens after the procedure? °· You may return to your normal, everyday life, including diet, activities, and medicines, unless your health care provider tells you not to do that. °Summary °· An echocardiogram is a procedure that uses painless sound waves (ultrasound) to produce an image of the heart. °· Images from an echocardiogram can provide important information about the size and shape of your heart, heart muscle function, heart valve function, and fluid buildup around your heart. °· You do not need to do anything to prepare before this procedure. You may eat and drink normally. °· After the echocardiogram is completed, you may return to your normal, everyday life, unless your health care provider tells you not to do that. °This information is not intended to replace advice given to you by your health care provider. Make sure you discuss any questions you have with your health care provider. °Document Revised: 02/22/2019 Document Reviewed: 12/04/2016 °Elsevier Patient Education © 2020 [employer] ° ° °

## 2020-06-16 NOTE — Progress Notes (Signed)
Cardiology Office Note:    Date:  06/16/2020   ID:  Gene [name], DOB 1956-02-17, MRN [id number]  PCP:  Mosie Lukes, MD  Cardiologist:  Jenne Campus, MD    Referring MD: Mosie Lukes, MD   No chief complaint on file. I am doing fine  History of Present Illness:    Gene [name] is a 64 y.o. male  with past medical history significant for coronary artery disease, cardiac catheterization done in 2019 show completely occluded proximal and mid LAD also high grade obstruction with small and diffusely diseased diagonal once,, also diffusely diseased circumflex artery as well as obtuse marginal, right coronary artery got eccentric 50% narrowing, LIMA to distal LAD patent, SVG to diagonal present, SVG to circumflex marginal occluded.  Also diabetes which was poorly controlled, dyslipidemia, peripheral vascular disease with no critical narrowing based on latest study done in 2020. Comes today 2 months for follow-up overall doing well.  He works at [employer] improvement have to walk a lot has no difficulty doing it.  Does complain of having some pain in his legs.  Denies have any chest pain tightness squeezing pressure burning chest, does have some exertional shortness of breath  Past Medical History:  Diagnosis Date  . Abnormal stress test 06/01/2018   Ischemia involving LAD territory  . Alcohol abuse, episodic 08/07/2008   Qualifier: Diagnosis of  By: Redmond Pulling MD, Frann Rider    . Anemia   . Angina pectoris ([place])   . CAD (coronary artery disease)   . Chronic pain in right foot   . COLONIC POLYPS, HX OF 08/07/2008   Qualifier: Diagnosis of  By: Lorrin Mais    . Coronary artery disease 08/07/2008   Qualifier: Diagnosis of  By: Lorrin Mais    . Diabetes ([place])   . Diabetes mellitus type 2 in obese ([place]) 08/07/2008   Qualifier: Diagnosis of  By: Lorrin Mais     . Diabetes mellitus type II   . Diabetic neuropathy ([place])   . Dog bite of calf, right, initial encounter  12/25/2018  . Elevated PSA 05/03/2014  . Essential hypertension 08/07/2008   Qualifier: Diagnosis of  By: Lorrin Mais    . FOOT PAIN, RIGHT 09/10/2008   Qualifier: Diagnosis of  By: Redmond Pulling MD, Frann Rider    . GERD 08/07/2008   Qualifier: Diagnosis of  By: Lorrin Mais    . GERD (gastroesophageal reflux disease)   . H/O tobacco use, presenting hazards to health 05/21/2009   Qualifier: Diagnosis of  By: Wynona Luna  Last cigarette smoked May 30, 2015    . History of colonic polyps   . Hyperlipidemia   . Hyperlipidemia, mixed 08/07/2008   Qualifier: Diagnosis of  By: Lorrin Mais    . Hypertension   . Insomnia 03/25/2013  . Left knee pain 11/03/2015  . Myocardial infarction ([place])   . Neuromuscular disorder ([place])    NEUROPATHY  . Obesity 06/24/2016  . Peripheral neuropathy 09/10/2008   Qualifier: Diagnosis of  By: Redmond Pulling MD, Frann Rider    . PERIPHERAL VASCULAR DISEASE 08/07/2008   Qualifier: Diagnosis of  By: Lorrin Mais    . Plantar fasciitis   . Preventative health care 05/03/2014  . Psoriasis 05/03/2014  . PVD (peripheral vascular disease) ([place])    PTA RLE 12/08 [place]  . SOB (shortness of breath) on exertion 05/02/2018  . Status post coronary artery bypass graft 05/11/2018   2000  .  TOBACCO USER 05/21/2009   Qualifier: Diagnosis of  By: Wynona Luna  Last cigarette smoked May 30, 2015      Past Surgical History:  Procedure Laterality Date  . CORONARY ARTERY BYPASS GRAFT     200 SVG to diagonal, LIMA to LAD, SVG to LCX  . LEFT HEART CATH AND CORS/GRAFTS ANGIOGRAPHY N/A 06/08/2018   Procedure: LEFT HEART CATH AND CORS/GRAFTS ANGIOGRAPHY;  Surgeon: Belva Crome, MD;  Location: [facility];  Service: Cardiovascular;  Laterality: N/A;  . OTHER SURGICAL HISTORY     PAD surgery  . REVISION TOTAL KNEE ARTHROPLASTY      Current Medications: Current Meds  Medication Sig  . amLODipine (NORVASC) 10 MG tablet Take 1 tablet (10 mg total) by mouth  daily.  Marland Kitchen atorvastatin (LIPITOR) 40 MG tablet TAKE 1 TABLET BY MOUTH ONCE DAILY .  Marland Kitchen fish oil-omega-3 fatty acids 1000 MG capsule Take 1 g by mouth at bedtime.    . gabapentin (NEURONTIN) 600 MG tablet Take 1 tab twice daily and may take 1 additional tab as needed for severe pain.  Do not exceed 3 tabs per 24 hours.  Marland Kitchen glimepiride (AMARYL) 4 MG tablet TAKE 1 TABLET BY MOUTH TWICE DAILY .  . insulin glargine (LANTUS SOLOSTAR) 100 UNIT/ML Solostar Pen INJECT 30 TO 35 UNITS SUBCUTANEOUSLY ONCE DAILY AT  10  PM  PER  SLIDING  SCALE  . Insulin Pen Needle (RELION PEN NEEDLES) 29G X 12MM MISC Use once daily in the evening  . labetalol (NORMODYNE) 200 MG tablet Take 1 tablet (200 mg total) by mouth 2 (two) times daily.  Marland Kitchen lisinopril (ZESTRIL) 20 MG tablet Take 1 tablet (20 mg total) by mouth daily.  . metFORMIN (GLUCOPHAGE-XR) 500 MG 24 hr tablet Take 2 tablets (1,000 mg total) by mouth 2 (two) times daily.  . Multiple Vitamins-Minerals (CENTRUM PO) Take 1 tablet by mouth daily.   . nitroGLYCERIN (NITROSTAT) 0.4 MG SL tablet Place 0.4 mg under the tongue every 5 (five) minutes as needed for chest pain.   Marland Kitchen omeprazole (PRILOSEC) 40 MG capsule Take 1 capsule (40 mg total) by mouth daily.  . ranolazine (RANEXA) 500 MG 12 hr tablet Take 1 tablet by mouth twice daily  . Specialty Vitamins Products (MAGNESIUM, AMINO ACID CHELATE,) 133 MG tablet Take 1 tablet by mouth daily.     Allergies:   Patient has no known allergies.   Social History   Socioeconomic History  . Marital status: Married    Spouse name: Not on file  . Number of children: Not on file  . Years of education: Not on file  . Highest education level: Not on file  Occupational History  . Not on file  Tobacco Use  . Smoking status: Former Smoker    Packs/day: 0.50    Years: 40.00    Pack years: 20.00    Types: Cigarettes    Quit date: 05/04/2015    Years since quitting: 5.1  . Smokeless tobacco: Never Used  Vaping Use  . Vaping Use:  Never used  Substance and Sexual Activity  . Alcohol use: No    Alcohol/week: 0.0 standard drinks  . Drug use: No    Comment: rare  . Sexual activity: Yes    Comment: lives with wife, retired   Other Topics Concern  . Not on file  Social History Narrative   Occupation: TEFL teacher   Second marriage - 3 children from first marriage  Tobacco Use - Yes.     Alcohol Use - yes(rare)           Social Determinants of Health   Financial Resource Strain:   . Difficulty of Paying Living Expenses:   Food Insecurity:   . Worried About Charity fundraiser in the Last Year:   . Arboriculturist in the Last Year:   Transportation Needs:   . Film/video editor (Medical):   Marland Kitchen Lack of Transportation (Non-Medical):   Physical Activity:   . Days of Exercise per Week:   . Minutes of Exercise per Session:   Stress:   . Feeling of Stress :   Social Connections:   . Frequency of Communication with Friends and Family:   . Frequency of Social Gatherings with Friends and Family:   . Attends Religious Services:   . Active Member of Clubs or Organizations:   . Attends Archivist Meetings:   Marland Kitchen Marital Status:      Family History: The patient's family history includes Aneurysm in his father; Cancer in his maternal uncle; Coronary artery disease in his mother; Hyperlipidemia in his father and another family member; Hypertension in his mother and another family member; Stroke in an other family member. There is no history of Colon cancer, Esophageal cancer, Liver cancer, Pancreatic cancer, Rectal cancer, or Stomach cancer. ROS:   Please see the history of present illness.    All 14 point review of systems negative except as described per history of present illness  EKGs/Labs/Other Studies Reviewed:      Recent Labs: 05/20/2020: ALT 11; BUN 20; Creatinine, Ser 1.04; Hemoglobin 11.4; Platelets 387.0; Potassium 4.9; Sodium 140; TSH 1.48  Recent Lipid Panel    Component Value  Date/Time   CHOL 82 05/20/2020 1105   CHOL 101 02/04/2020 1652   TRIG 99.0 05/20/2020 1105   HDL 26.30 (L) 05/20/2020 1105   HDL 31 (L) 02/04/2020 1652   CHOLHDL 3 05/20/2020 1105   VLDL 19.8 05/20/2020 1105   LDLCALC 36 05/20/2020 1105   LDLCALC 48 02/04/2020 1652    Physical Exam:    VS:  BP (!) 146/70 (BP Location: Right Arm, Patient Position: Sitting, Cuff Size: Normal)   Pulse 72   Ht 5\' 11"  (1.803 m)   Wt (!) 204 lb 12.8 oz (92.9 kg)   SpO2 98%   BMI 28.56 kg/m     Wt Readings from Last 3 Encounters:  06/16/20 (!) 204 lb 12.8 oz (92.9 kg)  05/20/20 206 lb 3.2 oz (93.5 kg)  02/04/20 207 lb (93.9 kg)     GEN:  Well nourished, well developed in no acute distress HEENT: Normal NECK: No JVD; No carotid bruits LYMPHATICS: No lymphadenopathy CARDIAC: RRR, no murmurs, no rubs, no gallops RESPIRATORY:  Clear to auscultation without rales, wheezing or rhonchi  ABDOMEN: Soft, non-tender, non-distended MUSCULOSKELETAL:  No edema; No deformity  SKIN: Warm and dry LOWER EXTREMITIES: no swelling NEUROLOGIC:  Alert and oriented x 3 PSYCHIATRIC:  Normal affect   ASSESSMENT:    1. Cardiomyopathy, unspecified type ([place])   2. Coronary artery disease of native artery of native heart with stable angina pectoris ([place])   3. Hyperlipidemia, mixed   4. Essential hypertension   5. Status post coronary artery bypass graft    PLAN:    In order of problems listed above:  1. Cardiomyopathy.  I will ask you to have repeated echocardiogram done to recheck left ventricle ejection fraction.  I  do see that he is taking glimepiride.  We may consider switching him to a different medication like Jardiance.  Will wait for results of echocardiogram. 2. Coronary disease: Stable from that point review on appropriate medication which I will continue. 3. Hyperlipidemia: He is on Lipitor 40 which I will continue.  I do have his fasting lipid profile from 05/20/2020 showing LDL 36, HDL of 26.  We will  continue present management. 4. Essential hypertension his blood pressure slightly elevated today but he said at home is always good.  I asked him to check his blood pressure at home on the regular basis let me know how it is. 5. Status post coronary bypass graft.  Noted. 6. Diabetes.  Stable followed by primary care physician.  We may considering switching glimepiride to Jardiance.   Medication Adjustments/Labs and Tests Ordered: Current medicines are reviewed at length with the patient today.  Concerns regarding medicines are outlined above.  Orders Placed This Encounter  Procedures  . ECHOCARDIOGRAM COMPLETE   Medication changes: No orders of the defined types were placed in this encounter.   Signed, Park Liter, MD, [facility] 06/16/2020 12:05 PM    [place]

## 2020-07-02 ENCOUNTER — Ambulatory Visit (HOSPITAL_COMMUNITY)
Admission: RE | Admit: 2020-07-02 | Discharge: 2020-07-02 | Disposition: A | Discharge status: Home / Self Care | Payer: Federal, State, Local not specified - PPO | Source: Ambulatory Visit | Attending: Cardiology | Admitting: Cardiology

## 2020-07-02 ENCOUNTER — Other Ambulatory Visit: Payer: Self-pay

## 2020-07-02 DIAGNOSIS — E785 Hyperlipidemia, unspecified: Secondary | ICD-10-CM | POA: Insufficient documentation

## 2020-07-02 DIAGNOSIS — I10 Essential (primary) hypertension: Secondary | ICD-10-CM | POA: Diagnosis not present

## 2020-07-02 DIAGNOSIS — I429 Cardiomyopathy, unspecified: Secondary | ICD-10-CM

## 2020-07-02 DIAGNOSIS — Z951 Presence of aortocoronary bypass graft: Secondary | ICD-10-CM | POA: Insufficient documentation

## 2020-07-02 DIAGNOSIS — E119 Type 2 diabetes mellitus without complications: Secondary | ICD-10-CM | POA: Diagnosis not present

## 2020-07-02 NOTE — Progress Notes (Signed)
  Echocardiogram 2D Echocardiogram has been performed.  Gene [name] 07/02/2020, 10:45 AM

## 2020-07-04 LAB — ECHOCARDIOGRAM COMPLETE
Area-P 1/2: 2.66 cm2
S' Lateral: 3.5 cm

## 2020-07-08 ENCOUNTER — Telehealth: Payer: Self-pay | Admitting: Cardiology

## 2020-07-08 NOTE — Telephone Encounter (Signed)
Gene [name] is returning Hayley's call in regards to his Echo results.

## 2020-07-09 NOTE — Telephone Encounter (Signed)
Called patient back. Informed him of results.

## 2020-08-30 ENCOUNTER — Other Ambulatory Visit: Payer: Self-pay | Admitting: Cardiology

## 2020-09-01 NOTE — Telephone Encounter (Signed)
Refill sent to pharmacy.   

## 2020-09-21 ENCOUNTER — Other Ambulatory Visit: Payer: Self-pay | Admitting: Cardiology

## 2020-10-14 ENCOUNTER — Other Ambulatory Visit: Payer: Self-pay

## 2020-10-14 ENCOUNTER — Ambulatory Visit: Payer: Federal, State, Local not specified - PPO | Admitting: Family Medicine

## 2020-10-14 ENCOUNTER — Encounter: Payer: Self-pay | Admitting: Family Medicine

## 2020-10-14 VITALS — BP 124/72 | HR 75 | Temp 98.2°F | Resp 16 | Wt 206.2 lb

## 2020-10-14 DIAGNOSIS — E669 Obesity, unspecified: Secondary | ICD-10-CM

## 2020-10-14 DIAGNOSIS — E782 Mixed hyperlipidemia: Secondary | ICD-10-CM

## 2020-10-14 DIAGNOSIS — I10 Essential (primary) hypertension: Secondary | ICD-10-CM | POA: Diagnosis not present

## 2020-10-14 DIAGNOSIS — R972 Elevated prostate specific antigen [PSA]: Secondary | ICD-10-CM | POA: Diagnosis not present

## 2020-10-14 DIAGNOSIS — Z86010 Personal history of colonic polyps: Secondary | ICD-10-CM

## 2020-10-14 DIAGNOSIS — R197 Diarrhea, unspecified: Secondary | ICD-10-CM | POA: Diagnosis not present

## 2020-10-14 DIAGNOSIS — E1169 Type 2 diabetes mellitus with other specified complication: Secondary | ICD-10-CM

## 2020-10-14 DIAGNOSIS — Z23 Encounter for immunization: Secondary | ICD-10-CM

## 2020-10-14 LAB — LIPID PANEL
Cholesterol: 97 mg/dL (ref 0–200)
HDL: 27.7 mg/dL — ABNORMAL LOW (ref >39.00)
LDL Cholesterol: 42 mg/dL (ref 0–99)
NonHDL: 69.25
Total CHOL/HDL Ratio: 4
Triglycerides: 138 mg/dL (ref 0.0–149.0)
VLDL: 27.6 mg/dL (ref 0.0–40.0)

## 2020-10-14 LAB — COMPREHENSIVE METABOLIC PANEL
ALT: 11 U/L (ref 0–53)
AST: 16 U/L (ref 0–37)
Albumin: 4.5 g/dL (ref 3.5–5.2)
Alkaline Phosphatase: 88 U/L (ref 39–117)
BUN: 26 mg/dL — ABNORMAL HIGH (ref 6–23)
CO2: 32 mEq/L (ref 19–32)
Calcium: 9.6 mg/dL (ref 8.4–10.5)
Chloride: 101 mEq/L (ref 96–112)
Creatinine, Ser: 1.23 mg/dL (ref 0.40–1.50)
GFR: 61.51 mL/min (ref >60.00)
Glucose, Bld: 107 mg/dL — ABNORMAL HIGH (ref 70–99)
Potassium: 5.3 mEq/L — ABNORMAL HIGH (ref 3.5–5.1)
Sodium: 140 mEq/L (ref 135–145)
Total Bilirubin: 0.4 mg/dL (ref 0.2–1.2)
Total Protein: 7.2 g/dL (ref 6.0–8.3)

## 2020-10-14 LAB — CBC
HCT: 34.3 % — ABNORMAL LOW (ref 39.0–52.0)
Hemoglobin: 11.6 g/dL — ABNORMAL LOW (ref 13.0–17.0)
MCHC: 33.8 g/dL (ref 30.0–36.0)
MCV: 90.5 fl (ref 78.0–100.0)
Platelets: 342 10*3/uL (ref 150.0–400.0)
RBC: 3.8 Mil/uL — ABNORMAL LOW (ref 4.22–5.81)
RDW: 13.9 % (ref 11.5–15.5)
WBC: 8.7 10*3/uL (ref 4.0–10.5)

## 2020-10-14 LAB — HEMOGLOBIN A1C: Hgb A1c MFr Bld: 7.9 % — ABNORMAL HIGH (ref 4.6–6.5)

## 2020-10-14 LAB — PSA: PSA: 8.54 ng/mL — ABNORMAL HIGH (ref 0.10–4.00)

## 2020-10-14 LAB — TSH: TSH: 1.19 u[IU]/mL (ref 0.35–4.50)

## 2020-10-14 NOTE — Patient Instructions (Addendum)
Beneifber two to three x a day   Make an appt for eye exam and have them send [place] a note  Diarrhea, Adult Diarrhea is when you pass loose and watery poop (stool) often. Diarrhea can make you feel weak and cause you to lose water in your body (get dehydrated). Losing water in your body can cause you to:  Feel tired and thirsty.  Have a dry mouth.  Go pee (urinate) less often. Diarrhea often lasts 2-3 days. However, it can last longer if it is a sign of something more serious. It is important to treat your diarrhea as told by your doctor. Follow these instructions at home: Eating and drinking     Follow these instructions as told by your doctor:  Take an ORS (oral rehydration solution). This is a drink that helps you replace fluids and minerals your body lost. It is sold at pharmacies and stores.  Drink plenty of fluids, such as: ? Water. ? Ice chips. ? Diluted fruit juice. ? Low-calorie sports drinks. ? Milk, if you want.  Avoid drinking fluids that have a lot of sugar or caffeine in them.  Eat bland, easy-to-digest foods in small amounts as you are able. These foods include: ? Bananas. ? Applesauce. ? Rice. ? Low-fat (lean) meats. ? Toast. ? Crackers.  Avoid alcohol.  Avoid spicy or fatty foods.  Medicines  Take over-the-counter and prescription medicines only as told by your doctor.  If you were prescribed an antibiotic medicine, take it as told by your doctor. Do not stop using the antibiotic even if you start to feel better. General instructions   Wash your hands often using soap and water. If soap and water are not available, use a hand sanitizer. Others in your home should wash their hands as well. Hands should be washed: ? After using the toilet or changing a diaper. ? Before preparing, cooking, or serving food. ? While caring for a sick person. ? While visiting someone in a hospital.  Drink enough fluid to keep your pee (urine) pale yellow.  Rest at  home while you get better.  Watch your condition for any changes.  Take a warm bath to help with any burning or pain from having diarrhea.  Keep all follow-up visits as told by your doctor. This is important. Contact a doctor if:  You have a fever.  Your diarrhea gets worse.  You have new symptoms.  You cannot keep fluids down.  You feel light-headed or dizzy.  You have a headache.  You have muscle cramps. Get help right away if:  You have chest pain.  You feel very weak or you pass out (faint).  You have bloody or black poop or poop that looks like tar.  You have very bad pain, cramping, or bloating in your belly (abdomen).  You have trouble breathing or you are breathing very quickly.  Your heart is beating very quickly.  Your skin feels cold and clammy.  You feel confused.  You have signs of losing too much water in your body, such as: ? Dark pee, very little pee, or no pee. ? Cracked lips. ? Dry mouth. ? Sunken eyes. ? Sleepiness. ? Weakness. Summary  Diarrhea is when you pass loose and watery poop (stool) often.  Diarrhea can make you feel weak and cause you to lose water in your body (get dehydrated).  Take an ORS (oral rehydration solution). This is a drink that is sold at pharmacies and stores.  Eat bland, easy-to-digest foods in small amounts as you are able.  Contact a doctor if your condition gets worse. Get help right away if you have signs that you have lost too much water in your body. This information is not intended to replace advice given to you by your health care provider. Make sure you discuss any questions you have with your health care provider. Document Revised: 04/07/2018 Document Reviewed: 04/07/2018 Elsevier Patient Education  [place].

## 2020-10-16 NOTE — Assessment & Plan Note (Signed)
hgba1c acceptable, minimize simple carbs. Increase exercise as tolerated. Continue current meds 

## 2020-10-16 NOTE — Assessment & Plan Note (Signed)
Tolerating statin, encouraged heart healthy diet, avoid trans fats, minimize simple carbs and saturated fats. Increase exercise as tolerated 

## 2020-10-16 NOTE — Progress Notes (Signed)
Subjective:    Patient ID: Gene [name], male    DOB: 01-Jan-1956, 64 y.o.   MRN: [id number]  Chief Complaint  Patient presents with  . Follow-up    HPI Patient is in today for follow up on chronic medical concerns. No recent febrile illness or hospitalizations. No polyuria or polydipsia. He survived a course of COVID and feels well now. Continues to avoid vaccinations. Tries to stay active and intermittently eats a heart healthy diet. Denies CP/palp/SOB/HA/congestion/fevers/GI or GU c/o. Taking meds as prescribed  Past Medical History:  Diagnosis Date  . Abnormal stress test 06/01/2018   Ischemia involving LAD territory  . Alcohol abuse, episodic 08/07/2008   Qualifier: Diagnosis of  By: Redmond Pulling MD, Frann Rider    . Anemia   . Angina pectoris ([place])   . CAD (coronary artery disease)   . Chronic pain in right foot   . COLONIC POLYPS, HX OF 08/07/2008   Qualifier: Diagnosis of  By: Lorrin Mais    . Coronary artery disease 08/07/2008   Qualifier: Diagnosis of  By: Lorrin Mais    . Diabetes ([place])   . Diabetes mellitus type 2 in obese ([place]) 08/07/2008   Qualifier: Diagnosis of  By: Lorrin Mais     . Diabetes mellitus type II   . Diabetic neuropathy ([place])   . Dog bite of calf, right, initial encounter 12/25/2018  . Elevated PSA 05/03/2014  . Essential hypertension 08/07/2008   Qualifier: Diagnosis of  By: Lorrin Mais    . FOOT PAIN, RIGHT 09/10/2008   Qualifier: Diagnosis of  By: Redmond Pulling MD, Frann Rider    . GERD 08/07/2008   Qualifier: Diagnosis of  By: Lorrin Mais    . GERD (gastroesophageal reflux disease)   . H/O tobacco use, presenting hazards to health 05/21/2009   Qualifier: Diagnosis of  By: Wynona Luna  Last cigarette smoked May 30, 2015    . History of colonic polyps   . Hyperlipidemia   . Hyperlipidemia, mixed 08/07/2008   Qualifier: Diagnosis of  By: Lorrin Mais    . Hypertension   . Insomnia 03/25/2013  . Left knee pain 11/03/2015   . Myocardial infarction ([place])   . Neuromuscular disorder ([place])    NEUROPATHY  . Obesity 06/24/2016  . Peripheral neuropathy 09/10/2008   Qualifier: Diagnosis of  By: Redmond Pulling MD, Frann Rider    . PERIPHERAL VASCULAR DISEASE 08/07/2008   Qualifier: Diagnosis of  By: Lorrin Mais    . Plantar fasciitis   . Preventative health care 05/03/2014  . Psoriasis 05/03/2014  . PVD (peripheral vascular disease) ([place])    PTA RLE 12/08 [place]  . SOB (shortness of breath) on exertion 05/02/2018  . Status post coronary artery bypass graft 05/11/2018   2000  . TOBACCO USER 05/21/2009   Qualifier: Diagnosis of  By: Wynona Luna  Last cigarette smoked May 30, 2015      Past Surgical History:  Procedure Laterality Date  . CORONARY ARTERY BYPASS GRAFT     200 SVG to diagonal, LIMA to LAD, SVG to LCX  . LEFT HEART CATH AND CORS/GRAFTS ANGIOGRAPHY N/A 06/08/2018   Procedure: LEFT HEART CATH AND CORS/GRAFTS ANGIOGRAPHY;  Surgeon: Belva Crome, MD;  Location: [facility];  Service: Cardiovascular;  Laterality: N/A;  . OTHER SURGICAL HISTORY     PAD surgery  . REVISION TOTAL KNEE ARTHROPLASTY      Family History  Problem  Relation Age of Onset  . Coronary artery disease Mother   . Hypertension Mother   . Hyperlipidemia Father   . Aneurysm Father        brain  . Hyperlipidemia Other   . Hypertension Other   . Stroke Other   . Cancer Maternal Uncle   . Colon cancer Neg Hx   . Esophageal cancer Neg Hx   . Liver cancer Neg Hx   . Pancreatic cancer Neg Hx   . Rectal cancer Neg Hx   . Stomach cancer Neg Hx     Social History   Socioeconomic History  . Marital status: Married    Spouse name: Not on file  . Number of children: Not on file  . Years of education: Not on file  . Highest education level: Not on file  Occupational History  . Not on file  Tobacco Use  . Smoking status: Former Smoker    Packs/day: 0.50    Years: 40.00    Pack years: 20.00    Types: Cigarettes     Quit date: 05/04/2015    Years since quitting: 5.4  . Smokeless tobacco: Never Used  Vaping Use  . Vaping Use: Never used  Substance and Sexual Activity  . Alcohol use: No    Alcohol/week: 0.0 standard drinks  . Drug use: No    Comment: rare  . Sexual activity: Yes    Comment: lives with wife, retired   Other Topics Concern  . Not on file  Social History Narrative   Occupation: grocery clerk   Second marriage - 3 children from first marriage   Tobacco Use - Yes.     Alcohol Use - yes(rare)           Social Determinants of Health   Financial Resource Strain:   . Difficulty of Paying Living Expenses: Not on file  Food Insecurity:   . Worried About Charity fundraiser in the Last Year: Not on file  . Ran Out of Food in the Last Year: Not on file  Transportation Needs:   . Lack of Transportation (Medical): Not on file  . Lack of Transportation (Non-Medical): Not on file  Physical Activity:   . Days of Exercise per Week: Not on file  . Minutes of Exercise per Session: Not on file  Stress:   . Feeling of Stress : Not on file  Social Connections:   . Frequency of Communication with Friends and Family: Not on file  . Frequency of Social Gatherings with Friends and Family: Not on file  . Attends Religious Services: Not on file  . Active Member of Clubs or Organizations: Not on file  . Attends Archivist Meetings: Not on file  . Marital Status: Not on file  Intimate Partner Violence:   . Fear of Current or Ex-Partner: Not on file  . Emotionally Abused: Not on file  . Physically Abused: Not on file  . Sexually Abused: Not on file    Outpatient Medications Prior to Visit  Medication Sig Dispense Refill  . amLODipine (NORVASC) 10 MG tablet Take 1 tablet (10 mg total) by mouth daily. 90 tablet 1  . atorvastatin (LIPITOR) 40 MG tablet Take 1 tablet by mouth once daily 90 tablet 2  . fish oil-omega-3 fatty acids 1000 MG capsule Take 1 g by mouth at bedtime.      .  gabapentin (NEURONTIN) 600 MG tablet Take 1 tab twice daily and may take 1 additional  tab as needed for severe pain.  Do not exceed 3 tabs per 24 hours. 270 tablet 1  . glimepiride (AMARYL) 4 MG tablet TAKE 1 TABLET BY MOUTH TWICE DAILY . 180 tablet 1  . insulin glargine (LANTUS SOLOSTAR) 100 UNIT/ML Solostar Pen INJECT 30 TO 35 UNITS SUBCUTANEOUSLY ONCE DAILY AT  10  PM  PER  SLIDING  SCALE 30 mL 1  . Insulin Pen Needle (RELION PEN NEEDLES) 29G X 12MM MISC Use once daily in the evening 100 each 1  . labetalol (NORMODYNE) 200 MG tablet Take 1 tablet (200 mg total) by mouth 2 (two) times daily. 180 tablet 1  . lisinopril (ZESTRIL) 20 MG tablet Take 1 tablet by mouth once daily 90 tablet 2  . metFORMIN (GLUCOPHAGE-XR) 500 MG 24 hr tablet Take 2 tablets (1,000 mg total) by mouth 2 (two) times daily. 360 tablet 1  . Multiple Vitamins-Minerals (CENTRUM PO) Take 1 tablet by mouth daily.     . nitroGLYCERIN (NITROSTAT) 0.4 MG SL tablet Place 0.4 mg under the tongue every 5 (five) minutes as needed for chest pain.     Marland Kitchen omeprazole (PRILOSEC) 40 MG capsule Take 1 capsule (40 mg total) by mouth daily. 90 capsule 1  . ranolazine (RANEXA) 500 MG 12 hr tablet Take 1 tablet by mouth twice daily 180 tablet 2  . Specialty Vitamins Products (MAGNESIUM, AMINO ACID CHELATE,) 133 MG tablet Take 1 tablet by mouth daily.     No facility-administered medications prior to visit.    No Known Allergies  Review of Systems  Constitutional: Negative for fever and malaise/fatigue.  HENT: Negative for congestion.   Eyes: Negative for blurred vision.  Respiratory: Negative for shortness of breath.   Cardiovascular: Negative for chest pain, palpitations and leg swelling.  Gastrointestinal: Negative for abdominal pain, blood in stool and nausea.  Genitourinary: Negative for dysuria and frequency.  Musculoskeletal: Negative for falls.  Skin: Negative for rash.  Neurological: Negative for dizziness, loss of consciousness  and headaches.  Endo/Heme/Allergies: Negative for environmental allergies.  Psychiatric/Behavioral: Negative for depression. The patient is nervous/anxious.        Objective:    Physical Exam Vitals and nursing note reviewed.  Constitutional:      General: He is not in acute distress.    Appearance: He is well-developed.  HENT:     Head: Normocephalic and atraumatic.     Nose: Nose normal.  Eyes:     General:        Right eye: No discharge.        Left eye: No discharge.  Cardiovascular:     Rate and Rhythm: Normal rate and regular rhythm.     Heart sounds: No murmur heard.   Pulmonary:     Effort: Pulmonary effort is normal.     Breath sounds: Normal breath sounds.  Abdominal:     General: Bowel sounds are normal.     Palpations: Abdomen is soft.     Tenderness: There is no abdominal tenderness.  Musculoskeletal:     Cervical back: Normal range of motion and neck supple.  Skin:    General: Skin is warm and dry.  Neurological:     Mental Status: He is alert and oriented to person, place, and time.     BP 124/72   Pulse 75   Temp 98.2 F (36.8 C) (Oral)   Resp 16   Wt 206 lb 3.2 oz (93.5 kg)   SpO2 98%   BMI  28.76 kg/m  Wt Readings from Last 3 Encounters:  10/14/20 206 lb 3.2 oz (93.5 kg)  06/16/20 (!) 204 lb 12.8 oz (92.9 kg)  05/20/20 206 lb 3.2 oz (93.5 kg)    Diabetic Foot Exam - Simple   No data filed     Lab Results  Component Value Date   WBC 8.7 10/14/2020   HGB 11.6 (L) 10/14/2020   HCT 34.3 (L) 10/14/2020   PLT 342.0 10/14/2020   GLUCOSE 107 (H) 10/14/2020   CHOL 97 10/14/2020   TRIG 138.0 10/14/2020   HDL 27.70 (L) 10/14/2020   LDLCALC 42 10/14/2020   ALT 11 10/14/2020   AST 16 10/14/2020   NA 140 10/14/2020   K 5.3 (H) 10/14/2020   CL 101 10/14/2020   CREATININE 1.23 10/14/2020   BUN 26 (H) 10/14/2020   CO2 32 10/14/2020   TSH 1.19 10/14/2020   PSA 8.54 (H) 10/14/2020   INR 1.1 07/31/2008   HGBA1C 7.9 (H) 10/14/2020    MICROALBUR 8.9 (H) 05/02/2018    Lab Results  Component Value Date   TSH 1.19 10/14/2020   Lab Results  Component Value Date   WBC 8.7 10/14/2020   HGB 11.6 (L) 10/14/2020   HCT 34.3 (L) 10/14/2020   MCV 90.5 10/14/2020   PLT 342.0 10/14/2020   Lab Results  Component Value Date   NA 140 10/14/2020   K 5.3 (H) 10/14/2020   CO2 32 10/14/2020   GLUCOSE 107 (H) 10/14/2020   BUN 26 (H) 10/14/2020   CREATININE 1.23 10/14/2020   BILITOT 0.4 10/14/2020   ALKPHOS 88 10/14/2020   AST 16 10/14/2020   ALT 11 10/14/2020   PROT 7.2 10/14/2020   ALBUMIN 4.5 10/14/2020   CALCIUM 9.6 10/14/2020   ANIONGAP 14 06/08/2018   GFR 61.51 10/14/2020   Lab Results  Component Value Date   CHOL 97 10/14/2020   Lab Results  Component Value Date   HDL 27.70 (L) 10/14/2020   Lab Results  Component Value Date   LDLCALC 42 10/14/2020   Lab Results  Component Value Date   TRIG 138.0 10/14/2020   Lab Results  Component Value Date   CHOLHDL 4 10/14/2020   Lab Results  Component Value Date   HGBA1C 7.9 (H) 10/14/2020       Assessment & Plan:   Problem List Items Addressed This Visit    Diabetes mellitus type 2 in obese ([place])    hgba1c acceptable, minimize simple carbs. Increase exercise as tolerated. Continue current meds      Relevant Orders   Hemoglobin A1c (Completed)   Hyperlipidemia, mixed    Tolerating statin, encouraged heart healthy diet, avoid trans fats, minimize simple carbs and saturated fats. Increase exercise as tolerated      Relevant Orders   Lipid panel (Completed)   Essential hypertension    Well controlled, no changes to meds. Encouraged heart healthy diet such as the DASH diet and exercise as tolerated.       Relevant Orders   CBC (Completed)   Comprehensive metabolic panel (Completed)   TSH (Completed)   Elevated PSA    Patient never saw urology his number today is up again so he is referred to urology for consultation      Relevant Orders   PSA  (Completed)   Ambulatory referral to Urology    Other Visit Diagnoses    Need for influenza vaccination    -  Primary   Relevant Orders  Flu Vaccine QUAD 36+ mos IM (Fluarix, Fluzone & Afluria Quad PF (Completed)   Diarrhea, unspecified type       Hx of colonic polyp       Relevant Orders   Ambulatory referral to Gastroenterology      I am having [name] "Merry Proud" maintain his Multiple Vitamins-Minerals (CENTRUM PO), nitroGLYCERIN, fish oil-omega-3 fatty acids, magnesium (amino acid chelate), amLODipine, glimepiride, labetalol, metFORMIN, omeprazole, Lantus SoloStar, gabapentin, ReliOn Pen Needles, ranolazine, atorvastatin, and lisinopril.  No orders of the defined types were placed in this encounter.    Penni Homans, MD

## 2020-10-16 NOTE — Assessment & Plan Note (Signed)
Well controlled, no changes to meds. Encouraged heart healthy diet such as the DASH diet and exercise as tolerated.  °

## 2020-10-16 NOTE — Assessment & Plan Note (Signed)
Patient never saw urology his number today is up again so he is referred to urology for consultation

## 2020-11-17 ENCOUNTER — Other Ambulatory Visit: Payer: Self-pay | Admitting: Family Medicine

## 2020-11-24 ENCOUNTER — Other Ambulatory Visit: Payer: Self-pay

## 2020-11-24 DIAGNOSIS — R52 Pain, unspecified: Secondary | ICD-10-CM

## 2020-11-24 MED ORDER — GABAPENTIN 600 MG PO TABS: ORAL_TABLET | ORAL | 1 refills | Status: DC

## 2020-12-12 DIAGNOSIS — G709 Myoneural disorder, unspecified: Secondary | ICD-10-CM | POA: Insufficient documentation

## 2020-12-12 DIAGNOSIS — I10 Essential (primary) hypertension: Secondary | ICD-10-CM | POA: Insufficient documentation

## 2020-12-12 DIAGNOSIS — E1140 Type 2 diabetes mellitus with diabetic neuropathy, unspecified: Secondary | ICD-10-CM | POA: Insufficient documentation

## 2020-12-12 DIAGNOSIS — I739 Peripheral vascular disease, unspecified: Secondary | ICD-10-CM | POA: Insufficient documentation

## 2020-12-12 DIAGNOSIS — Z86010 Personal history of colonic polyps: Secondary | ICD-10-CM | POA: Insufficient documentation

## 2020-12-12 DIAGNOSIS — I219 Acute myocardial infarction, unspecified: Secondary | ICD-10-CM | POA: Insufficient documentation

## 2020-12-12 DIAGNOSIS — E119 Type 2 diabetes mellitus without complications: Secondary | ICD-10-CM | POA: Insufficient documentation

## 2020-12-12 DIAGNOSIS — I2510 Atherosclerotic heart disease of native coronary artery without angina pectoris: Secondary | ICD-10-CM | POA: Insufficient documentation

## 2020-12-12 DIAGNOSIS — M722 Plantar fascial fibromatosis: Secondary | ICD-10-CM | POA: Insufficient documentation

## 2020-12-12 DIAGNOSIS — E785 Hyperlipidemia, unspecified: Secondary | ICD-10-CM | POA: Insufficient documentation

## 2020-12-12 DIAGNOSIS — K219 Gastro-esophageal reflux disease without esophagitis: Secondary | ICD-10-CM | POA: Insufficient documentation

## 2020-12-12 DIAGNOSIS — G8929 Other chronic pain: Secondary | ICD-10-CM | POA: Insufficient documentation

## 2020-12-16 ENCOUNTER — Other Ambulatory Visit: Payer: Self-pay

## 2020-12-16 ENCOUNTER — Other Ambulatory Visit (HOSPITAL_BASED_OUTPATIENT_CLINIC_OR_DEPARTMENT_OTHER): Payer: Self-pay | Admitting: Internal Medicine

## 2020-12-16 ENCOUNTER — Ambulatory Visit: Payer: Federal, State, Local not specified - PPO | Admitting: Cardiology

## 2020-12-16 ENCOUNTER — Ambulatory Visit: Payer: Federal, State, Local not specified - PPO | Attending: Internal Medicine

## 2020-12-16 ENCOUNTER — Encounter: Payer: Self-pay | Admitting: Cardiology

## 2020-12-16 VITALS — BP 130/70 | HR 74 | Ht 71.0 in | Wt 200.0 lb

## 2020-12-16 DIAGNOSIS — E782 Mixed hyperlipidemia: Secondary | ICD-10-CM | POA: Diagnosis not present

## 2020-12-16 DIAGNOSIS — I25118 Atherosclerotic heart disease of native coronary artery with other forms of angina pectoris: Secondary | ICD-10-CM

## 2020-12-16 DIAGNOSIS — I739 Peripheral vascular disease, unspecified: Secondary | ICD-10-CM | POA: Diagnosis not present

## 2020-12-16 DIAGNOSIS — I10 Essential (primary) hypertension: Secondary | ICD-10-CM

## 2020-12-16 DIAGNOSIS — Z23 Encounter for immunization: Secondary | ICD-10-CM

## 2020-12-16 MED FILL — PFIZER-BIONTECH COVID-19 VA: 30 | 21 days supply | Qty: 0 | Fill #0

## 2020-12-16 NOTE — Patient Instructions (Signed)

## 2020-12-16 NOTE — Progress Notes (Signed)
   Covid-19 Vaccination Clinic  Name:  ISACK LAVALLEY    MRN: [id number] DOB: Aug 22, 1956  12/16/2020  Mr. Schewe was observed post Covid-19 immunization for 15 minutes without incident. He was provided with Vaccine Information Sheet and instruction to access the V-Safe system. Vaccinated by [employer].  Mr. Romig was instructed to call 911 with any severe reactions post vaccine: Marland Kitchen Difficulty breathing  . Swelling of face and throat  . A fast heartbeat  . A bad rash all over body  . Dizziness and weakness   Immunizations Administered    Name Date Dose VIS Date Route   PFIZER Comrnaty(Gray TOP) Covid-19 Vaccine 12/16/2020  9:45 AM 0.3 mL 10/23/2020 Intramuscular   Manufacturer: [employer], [employer]   Lot: [id number]   NDC: [phone]

## 2020-12-16 NOTE — Progress Notes (Signed)
Cardiology Office Note:    Date:  12/16/2020   ID:  Gene [name], DOB 10/09/1956, MRN [id number]  PCP:  Mosie Lukes, MD  Cardiologist:  Jenne Campus, MD    Referring MD: Mosie Lukes, MD   Chief Complaint  Patient presents with  . Follow-up  . stomach issues    History of Present Illness:    Gene [name] is a 65 y.o. male with past medical history significant for coronary artery disease status post coronary artery bypass graft years ago, last evaluation of his coronary artery was done by cardiac catheterization from 2019 which showed no target for interventions, also diabetes, hypertension, dyslipidemia.  Comes today 2 months for follow-up overall seems to be doing well cardiac wise but described to have diarrhea every time he eats after that he have to go to the restroom there is no cramps no pain he also complained of having a lot of gas.  He lost some weight because of this.  No chest pain tightness squeezing pressure burning chest.  He works at [employer] he is very busy over there he can walk a lot have to walk a lot have no difficulty doing it no shortness of breath no chest pain  Past Medical History:  Diagnosis Date  . Abnormal stress test 06/01/2018   Ischemia involving LAD territory  . Alcohol abuse, episodic 08/07/2008   Qualifier: Diagnosis of  By: Redmond Pulling MD, Frann Rider    . Anemia   . Angina pectoris ([place])   . CAD (coronary artery disease)   . Chronic pain in right foot   . COLONIC POLYPS, HX OF 08/07/2008   Qualifier: Diagnosis of  By: Lorrin Mais    . Coronary artery disease 08/07/2008   Qualifier: Diagnosis of  By: Lorrin Mais    . Diabetes ([place])   . Diabetes mellitus type 2 in obese ([place]) 08/07/2008   Qualifier: Diagnosis of  By: Lorrin Mais     . Diabetes mellitus type II   . Diabetic neuropathy ([place])   . Dog bite of calf, right, initial encounter 12/25/2018  . Elevated PSA 05/03/2014  . Essential hypertension 08/07/2008   Qualifier:  Diagnosis of  By: Lorrin Mais    . FOOT PAIN, RIGHT 09/10/2008   Qualifier: Diagnosis of  By: Redmond Pulling MD, Frann Rider    . GERD 08/07/2008   Qualifier: Diagnosis of  By: Lorrin Mais    . GERD (gastroesophageal reflux disease)   . H/O tobacco use, presenting hazards to health 05/21/2009   Qualifier: Diagnosis of  By: Wynona Luna  Last cigarette smoked May 30, 2015    . History of colonic polyps   . Hyperlipidemia   . Hyperlipidemia, mixed 08/07/2008   Qualifier: Diagnosis of  By: Lorrin Mais    . Hypertension   . Insomnia 03/25/2013  . Left knee pain 11/03/2015  . Myocardial infarction ([place])   . Neuromuscular disorder ([place])    NEUROPATHY  . Obesity 06/24/2016  . Peripheral neuropathy 09/10/2008   Qualifier: Diagnosis of  By: Redmond Pulling MD, Frann Rider    . PERIPHERAL VASCULAR DISEASE 08/07/2008   Qualifier: Diagnosis of  By: Lorrin Mais    . Plantar fasciitis   . Preventative health care 05/03/2014  . Psoriasis 05/03/2014  . PVD (peripheral vascular disease) ([place])    PTA RLE 12/08 [place]  . SOB (shortness of breath) on exertion 05/02/2018  . Status post coronary artery bypass graft  05/11/2018   2000  . TOBACCO USER 05/21/2009   Qualifier: Diagnosis of  By: Wynona Luna  Last cigarette smoked May 30, 2015      Past Surgical History:  Procedure Laterality Date  . CORONARY ARTERY BYPASS GRAFT     200 SVG to diagonal, LIMA to LAD, SVG to LCX  . LEFT HEART CATH AND CORS/GRAFTS ANGIOGRAPHY N/A 06/08/2018   Procedure: LEFT HEART CATH AND CORS/GRAFTS ANGIOGRAPHY;  Surgeon: Belva Crome, MD;  Location: [facility];  Service: Cardiovascular;  Laterality: N/A;  . OTHER SURGICAL HISTORY     PAD surgery  . REVISION TOTAL KNEE ARTHROPLASTY      Current Medications: Current Meds  Medication Sig  . Alpha-D-Galactosidase (ANTI-GAS PO) Take 1 tablet by mouth daily.  Marland Kitchen amLODipine (NORVASC) 10 MG tablet Take 1 tablet (10 mg total) by mouth daily.  Marland Kitchen  atorvastatin (LIPITOR) 40 MG tablet Take 1 tablet by mouth once daily  . fish oil-omega-3 fatty acids 1000 MG capsule Take 1 g by mouth at bedtime.  . gabapentin (NEURONTIN) 600 MG tablet Take 1 tab twice daily and may take 1 additional tab as needed for severe pain.  Do not exceed 3 tabs per 24 hours.  Marland Kitchen glimepiride (AMARYL) 4 MG tablet Take 1 tablet (4 mg total) by mouth in the morning and at bedtime.  . insulin glargine (LANTUS SOLOSTAR) 100 UNIT/ML Solostar Pen INJECT 30 TO 35 UNITS SUBCUTANEOUSLY ONCE DAILY AT  10  PM  PER  SLIDING  SCALE  . Insulin Pen Needle (RELION PEN NEEDLES) 29G X 12MM MISC Use once daily in the evening  . labetalol (NORMODYNE) 200 MG tablet Take 1 tablet (200 mg total) by mouth 2 (two) times daily.  Marland Kitchen lisinopril (ZESTRIL) 20 MG tablet Take 1 tablet by mouth once daily  . Loperamide HCl (ANTI-DIARRHEAL PO) Take 1 tablet by mouth as needed.  . metFORMIN (GLUCOPHAGE-XR) 500 MG 24 hr tablet Take 2 tablets (1,000 mg total) by mouth 2 (two) times daily.  . Multiple Vitamins-Minerals (CENTRUM PO) Take 1 tablet by mouth daily.  . nitroGLYCERIN (NITROSTAT) 0.4 MG SL tablet Place 0.4 mg under the tongue every 5 (five) minutes as needed for chest pain.  Marland Kitchen omeprazole (PRILOSEC) 40 MG capsule Take 1 capsule (40 mg total) by mouth daily.  . ranolazine (RANEXA) 500 MG 12 hr tablet Take 1 tablet by mouth twice daily  . Specialty Vitamins Products (MAGNESIUM, AMINO ACID CHELATE,) 133 MG tablet Take 1 tablet by mouth daily.  Marland Kitchen VITAMIN D, CHOLECALCIFEROL, PO Take 1 tablet by mouth daily.     Allergies:   Patient has no known allergies.   Social History   Socioeconomic History  . Marital status: Married    Spouse name: Not on file  . Number of children: Not on file  . Years of education: Not on file  . Highest education level: Not on file  Occupational History  . Not on file  Tobacco Use  . Smoking status: Former Smoker    Packs/day: 0.50    Years: 40.00    Pack years:  20.00    Types: Cigarettes    Quit date: 05/04/2015    Years since quitting: 5.6  . Smokeless tobacco: Never Used  Vaping Use  . Vaping Use: Never used  Substance and Sexual Activity  . Alcohol use: No    Alcohol/week: 0.0 standard drinks  . Drug use: No    Comment: rare  .  Sexual activity: Yes    Comment: lives with wife, retired   Other Topics Concern  . Not on file  Social History Narrative   Occupation: grocery clerk   Second marriage - 3 children from first marriage   Tobacco Use - Yes.     Alcohol Use - yes(rare)           Social Determinants of Health   Financial Resource Strain: Not on file  Food Insecurity: Not on file  Transportation Needs: Not on file  Physical Activity: Not on file  Stress: Not on file  Social Connections: Not on file     Family History: The patient's family history includes Aneurysm in his father; Cancer in his maternal uncle; Coronary artery disease in his mother; Hyperlipidemia in his father and another family member; Hypertension in his mother and another family member; Stroke in an other family member. There is no history of Colon cancer, Esophageal cancer, Liver cancer, Pancreatic cancer, Rectal cancer, or Stomach cancer. ROS:   Please see the history of present illness.    All 14 point review of systems negative except as described per history of present illness  EKGs/Labs/Other Studies Reviewed:      Recent Labs: 10/14/2020: ALT 11; BUN 26; Creatinine, Ser 1.23; Hemoglobin 11.6; Platelets 342.0; Potassium 5.3; Sodium 140; TSH 1.19  Recent Lipid Panel    Component Value Date/Time   CHOL 97 10/14/2020 1046   CHOL 101 02/04/2020 1652   TRIG 138.0 10/14/2020 1046   HDL 27.70 (L) 10/14/2020 1046   HDL 31 (L) 02/04/2020 1652   CHOLHDL 4 10/14/2020 1046   VLDL 27.6 10/14/2020 1046   LDLCALC 42 10/14/2020 1046   LDLCALC 48 02/04/2020 1652    Physical Exam:    VS:  BP 130/70 (BP Location: Left Arm, Patient Position: Sitting)    Pulse 74   Ht 5\' 11"  (1.803 m)   Wt 200 lb (90.7 kg)   SpO2 96%   BMI 27.89 kg/m     Wt Readings from Last 3 Encounters:  12/16/20 200 lb (90.7 kg)  10/14/20 206 lb 3.2 oz (93.5 kg)  06/16/20 (!) 204 lb 12.8 oz (92.9 kg)     GEN:  Well nourished, well developed in no acute distress HEENT: Normal NECK: No JVD; No carotid bruits LYMPHATICS: No lymphadenopathy CARDIAC: RRR, no murmurs, no rubs, no gallops RESPIRATORY:  Clear to auscultation without rales, wheezing or rhonchi  ABDOMEN: Soft, non-tender, non-distended MUSCULOSKELETAL:  No edema; No deformity  SKIN: Warm and dry LOWER EXTREMITIES: no swelling NEUROLOGIC:  Alert and oriented x 3 PSYCHIATRIC:  Normal affect   ASSESSMENT:    1. Coronary artery disease of native artery of native heart with stable angina pectoris ([place])   2. Mixed hyperlipidemia   3. PERIPHERAL VASCULAR DISEASE   4. Essential hypertension    PLAN:    In order of problems listed above:  1. Coronary disease seems to be stable from that point of an appropriate medications which I will continue. 2. Dyslipidemia he is on statin which I will continue his LDL is 42 HDL 27 this is from K PN from 09/13/2020. 3. Peripheral vascular disease: No symptoms from that point of view very active walks a lot have no difficulty doing it continue monitoring. 4. Essential hypertension blood pressure well controlled continue present management. 5. Stomach issue: I told him he need to see gastroenterologist he probably will require colonoscopy he does have some history of colon polyps I definitely need  to be checked for that.   Medication Adjustments/Labs and Tests Ordered: Current medicines are reviewed at length with the patient today.  Concerns regarding medicines are outlined above.  No orders of the defined types were placed in this encounter.  Medication changes: No orders of the defined types were placed in this encounter.   Signed, Park Liter, MD,  [facility] 12/16/2020 9:33 AM    [place]

## 2021-01-14 ENCOUNTER — Other Ambulatory Visit: Payer: Self-pay | Admitting: Family Medicine

## 2021-02-13 ENCOUNTER — Other Ambulatory Visit: Payer: Self-pay | Admitting: Family Medicine

## 2021-03-02 DIAGNOSIS — R972 Elevated prostate specific antigen [PSA]: Secondary | ICD-10-CM | POA: Diagnosis not present

## 2021-03-05 ENCOUNTER — Ambulatory Visit: Payer: Federal, State, Local not specified - PPO | Admitting: Family Medicine

## 2021-03-05 ENCOUNTER — Other Ambulatory Visit: Payer: Self-pay

## 2021-03-05 ENCOUNTER — Encounter: Payer: Self-pay | Admitting: Family Medicine

## 2021-03-05 VITALS — BP 122/66 | HR 80 | Temp 98.0°F | Resp 16 | Wt 200.0 lb

## 2021-03-05 DIAGNOSIS — E1169 Type 2 diabetes mellitus with other specified complication: Secondary | ICD-10-CM

## 2021-03-05 DIAGNOSIS — E669 Obesity, unspecified: Secondary | ICD-10-CM

## 2021-03-05 DIAGNOSIS — K219 Gastro-esophageal reflux disease without esophagitis: Secondary | ICD-10-CM

## 2021-03-05 DIAGNOSIS — R197 Diarrhea, unspecified: Secondary | ICD-10-CM

## 2021-03-05 DIAGNOSIS — R252 Cramp and spasm: Secondary | ICD-10-CM

## 2021-03-05 DIAGNOSIS — Z87891 Personal history of nicotine dependence: Secondary | ICD-10-CM

## 2021-03-05 DIAGNOSIS — R972 Elevated prostate specific antigen [PSA]: Secondary | ICD-10-CM | POA: Diagnosis not present

## 2021-03-05 DIAGNOSIS — I10 Essential (primary) hypertension: Secondary | ICD-10-CM | POA: Diagnosis not present

## 2021-03-05 DIAGNOSIS — E782 Mixed hyperlipidemia: Secondary | ICD-10-CM

## 2021-03-05 HISTORY — DX: Diarrhea, unspecified: R19.7

## 2021-03-05 MED ORDER — AMLODIPINE BESYLATE 10 MG PO TABS: 10.0000 mg | ORAL_TABLET | Freq: Every day | ORAL | 1 refills | Status: DC

## 2021-03-05 MED ORDER — LANTUS SOLOSTAR 100 UNIT/ML ~~LOC~~ SOPN: 25.0000 [IU] | PEN_INJECTOR | Freq: Every day | SUBCUTANEOUS | 5 refills | Status: DC

## 2021-03-05 NOTE — Patient Instructions (Signed)
Diarrhea, Adult Diarrhea is frequent loose and watery bowel movements. Diarrhea can make you feel weak and cause you to become dehydrated. Dehydration can make you tired and thirsty, cause you to have a dry mouth, and decrease how often you urinate. Diarrhea typically lasts 2-3 days. However, it can last longer if it is a sign of something more serious. It is important to treat your diarrhea as told by your health care provider. Follow these instructions at home: Eating and drinking Follow these recommendations as told by your health care provider:  Take an oral rehydration solution (ORS). This is an over-the-counter medicine that helps return your body to its normal balance of nutrients and water. It is found at pharmacies and retail stores.  Drink plenty of fluids, such as water, ice chips, diluted fruit juice, and low-calorie sports drinks. You can drink milk also, if desired.  Avoid drinking fluids that contain a lot of sugar or caffeine, such as energy drinks, sports drinks, and soda.  Eat bland, easy-to-digest foods in small amounts as you are able. These foods include bananas, applesauce, rice, lean meats, toast, and crackers.  Avoid alcohol.  Avoid spicy or fatty foods.      Medicines  Take over-the-counter and prescription medicines only as told by your health care provider.  If you were prescribed an antibiotic medicine, take it as told by your health care provider. Do not stop using the antibiotic even if you start to feel better. General instructions  Wash your hands often using soap and water. If soap and water are not available, use a hand sanitizer. Others in the household should wash their hands as well. Hands should be washed: ? After using the toilet or changing a diaper. ? Before preparing, cooking, or serving food. ? While caring for a sick person or while visiting someone in a hospital.  Drink enough fluid to keep your urine pale yellow.  Rest at home while you  recover.  Watch your condition for any changes.  Take a warm bath to relieve any burning or pain from frequent diarrhea episodes.  Keep all follow-up visits as told by your health care provider. This is important.   Contact a health care provider if:  You have a fever.  Your diarrhea gets worse.  You have new symptoms.  You cannot keep fluids down.  You feel light-headed or dizzy.  You have a headache.  You have muscle cramps. Get help right away if:  You have chest pain.  You feel extremely weak or you faint.  You have bloody or black stools or stools that look like tar.  You have severe pain, cramping, or bloating in your abdomen.  You have trouble breathing or you are breathing very quickly.  Your heart is beating very quickly.  Your skin feels cold and clammy.  You feel confused.  You have signs of dehydration, such as: ? Dark urine, very little urine, or no urine. ? Cracked lips. ? Dry mouth. ? Sunken eyes. ? Sleepiness. ? Weakness. Summary  Diarrhea is frequent loose and watery bowel movements. Diarrhea can make you feel weak and cause you to become dehydrated.  Drink enough fluids to keep your urine pale yellow.  Make sure that you wash your hands after using the toilet. If soap and water are not available, use hand sanitizer.  Contact a health care provider if your diarrhea gets worse or you have new symptoms.  Get help right away if you have signs of dehydration.  This information is not intended to replace advice given to you by your health care provider. Make sure you discuss any questions you have with your health care provider. Document Revised: 03/20/2019 Document Reviewed: 04/07/2018 Elsevier Patient Education  2021 [employer].

## 2021-03-05 NOTE — Progress Notes (Signed)
Patient ID: Gene [name], male    DOB: [birth year]  Age: 65 y.o. MRN: [id number]    Subjective:  Subjective  HPI [name] presents for office visit today for follow up on chronic medical concerns. He reports that the diarrhea is not painful, but he states that he knows when it happens as with every episode he gets gassy. He states that he takes a probiotic which helps initially, but states that the diarrhea comes back again after 3 days. He endorses taking Mg supplements and having a benefiber. He denies any sticky or dark colored stool. He reports that he lost a significant amount of weight due to his diarrhea episodes and that now he experiences muscle spasms/cramps that cause him pain that wakes him up at the middle of the night. He states that taking a shower usually helps relieve the discomfort. He denies any chest pain, SOB, fever, abdominal pain, cough, chills, sore throat, dysuria, urinary incontinence, back pain, HA, or N/V. He denies any recent heart burn. He states that he is doing fine except for his neuropathy in his feet. For physical exercise, he reports that he walks about 3000 steps a day at work, although he states that since his knee replacement surgery he sometimes experiences pain but it is manageable. He reports that when he overeats, he experiences nausea, feels that his heart rate is briefly fast. He states that he resumed smoking late for 3 years and then quit at 2014.    Review of Systems  Constitutional: Negative for chills, fatigue and fever.  HENT: Negative for congestion, rhinorrhea, sinus pressure, sinus pain and sore throat.   Eyes: Negative for pain.  Respiratory: Negative for cough and shortness of breath.   Cardiovascular: Negative for chest pain, palpitations and leg swelling.  Gastrointestinal: Positive for diarrhea. Negative for abdominal pain, blood in stool, constipation, nausea and vomiting.  Genitourinary: Negative for dysuria, flank pain, frequency  and penile pain.  Musculoskeletal: Negative for back pain.       (+) neuropathy in feet (+) muscle cramps  Neurological: Negative for headaches.    History Past Medical History:  Diagnosis Date  . Abnormal stress test 06/01/2018   Ischemia involving LAD territory  . Alcohol abuse, episodic 08/07/2008   Qualifier: Diagnosis of  By: Redmond Pulling MD, Frann Rider    . Anemia   . Angina pectoris ([place])   . CAD (coronary artery disease)   . Chronic pain in right foot   . COLONIC POLYPS, HX OF 08/07/2008   Qualifier: Diagnosis of  By: Lorrin Mais    . Coronary artery disease 08/07/2008   Qualifier: Diagnosis of  By: Lorrin Mais    . Diabetes ([place])   . Diabetes mellitus type 2 in obese ([place]) 08/07/2008   Qualifier: Diagnosis of  By: Lorrin Mais     . Diabetes mellitus type II   . Diabetic neuropathy ([place])   . Dog bite of calf, right, initial encounter 12/25/2018  . Elevated PSA 05/03/2014  . Essential hypertension 08/07/2008   Qualifier: Diagnosis of  By: Lorrin Mais    . FOOT PAIN, RIGHT 09/10/2008   Qualifier: Diagnosis of  By: Redmond Pulling MD, Frann Rider    . GERD 08/07/2008   Qualifier: Diagnosis of  By: Lorrin Mais    . GERD (gastroesophageal reflux disease)   . H/O tobacco use, presenting hazards to health 05/21/2009   Qualifier: Diagnosis of  By: Wynona Luna  Last cigarette smoked  May 30, 2015    . History of colonic polyps   . Hyperlipidemia   . Hyperlipidemia, mixed 08/07/2008   Qualifier: Diagnosis of  By: Lorrin Mais    . Hypertension   . Insomnia 03/25/2013  . Left knee pain 11/03/2015  . Myocardial infarction ([place])   . Neuromuscular disorder ([place])    NEUROPATHY  . Obesity 06/24/2016  . Peripheral neuropathy 09/10/2008   Qualifier: Diagnosis of  By: Redmond Pulling MD, Frann Rider    . PERIPHERAL VASCULAR DISEASE 08/07/2008   Qualifier: Diagnosis of  By: Lorrin Mais    . Plantar fasciitis   . Preventative health care 05/03/2014  . Psoriasis  05/03/2014  . PVD (peripheral vascular disease) ([place])    PTA RLE 12/08 [place]  . SOB (shortness of breath) on exertion 05/02/2018  . Status post coronary artery bypass graft 05/11/2018   2000  . TOBACCO USER 05/21/2009   Qualifier: Diagnosis of  By: Wynona Luna  Last cigarette smoked May 30, 2015      He has a past surgical history that includes Coronary artery bypass graft; Other surgical history; Revision total knee arthroplasty; and LEFT HEART CATH AND CORS/GRAFTS ANGIOGRAPHY (N/A, 06/08/2018).   His family history includes Aneurysm in his father; Cancer in his maternal uncle; Coronary artery disease in his mother; Hyperlipidemia in his father and another family member; Hypertension in his mother and another family member; Stroke in an other family member.He reports that he quit smoking about 5 years ago. His smoking use included cigarettes. He has a 20.00 pack-year smoking history. He has never used smokeless tobacco. He reports that he does not drink alcohol and does not use drugs.  Current Outpatient Medications on File Prior to Visit  Medication Sig Dispense Refill  . Alpha-D-Galactosidase (ANTI-GAS PO) Take 1 tablet by mouth daily.    Marland Kitchen atorvastatin (LIPITOR) 40 MG tablet Take 1 tablet by mouth once daily 90 tablet 2  . COVID-19 mRNA vaccine, Pfizer, 30 MCG/0.3ML injection INJECT AS DIRECTED .3 mL 0  . fish oil-omega-3 fatty acids 1000 MG capsule Take 1 g by mouth at bedtime.    . gabapentin (NEURONTIN) 600 MG tablet Take 1 tab twice daily and may take 1 additional tab as needed for severe pain.  Do not exceed 3 tabs per 24 hours. 270 tablet 1  . glimepiride (AMARYL) 4 MG tablet Take 1 tablet (4 mg total) by mouth in the morning and at bedtime. 180 tablet 1  . Insulin Pen Needle (RELION PEN NEEDLES) 29G X 12MM MISC Use once daily in the evening 100 each 1  . labetalol (NORMODYNE) 200 MG tablet Take 1 tablet (200 mg total) by mouth 2 (two) times daily. 180 tablet 1  . lisinopril  (ZESTRIL) 20 MG tablet Take 1 tablet by mouth once daily 90 tablet 2  . Loperamide HCl (ANTI-DIARRHEAL PO) Take 1 tablet by mouth as needed.    . metFORMIN (GLUCOPHAGE-XR) 500 MG 24 hr tablet Take 2 tablets (1,000 mg total) by mouth 2 (two) times daily. 360 tablet 1  . Multiple Vitamins-Minerals (CENTRUM PO) Take 1 tablet by mouth daily.    Marland Kitchen omeprazole (PRILOSEC) 40 MG capsule Take 1 capsule by mouth once daily 90 capsule 0  . ranolazine (RANEXA) 500 MG 12 hr tablet Take 1 tablet by mouth twice daily 180 tablet 2  . Specialty Vitamins Products (MAGNESIUM, AMINO ACID CHELATE,) 133 MG tablet Take 1 tablet by mouth daily.    Marland Kitchen  VITAMIN D, CHOLECALCIFEROL, PO Take 1 tablet by mouth daily.    . nitroGLYCERIN (NITROSTAT) 0.4 MG SL tablet Place 0.4 mg under the tongue every 5 (five) minutes as needed for chest pain. (Patient not taking: Reported on 03/05/2021)     No current facility-administered medications on file prior to visit.     Objective:  Objective  Physical Exam Constitutional:      General: He is not in acute distress.    Appearance: Normal appearance. He is well-developed. He is not ill-appearing or toxic-appearing.  HENT:     Head: Normocephalic and atraumatic.     Right Ear: Tympanic membrane, ear canal and external ear normal.     Left Ear: Tympanic membrane, ear canal and external ear normal.     Nose: No congestion or rhinorrhea.  Eyes:     Extraocular Movements: Extraocular movements intact.     Conjunctiva/sclera: Conjunctivae normal.     Pupils: Pupils are equal, round, and reactive to light.  Neck:     Thyroid: No thyromegaly.  Cardiovascular:     Rate and Rhythm: Normal rate and regular rhythm.     Pulses: Normal pulses.     Heart sounds: Normal heart sounds. No murmur heard.   Pulmonary:     Effort: Pulmonary effort is normal. No respiratory distress.     Breath sounds: Normal breath sounds. No wheezing, rhonchi or rales.  Abdominal:     General: Bowel sounds  are normal. There is no distension.     Palpations: Abdomen is soft. There is no mass.     Tenderness: There is no abdominal tenderness. There is no guarding.     Hernia: No hernia is present.  Musculoskeletal:        General: Normal range of motion.     Cervical back: Normal range of motion and neck supple.  Lymphadenopathy:     Cervical: No cervical adenopathy.  Skin:    General: Skin is warm and dry.  Neurological:     Mental Status: He is alert and oriented to person, place, and time.     Cranial Nerves: No cranial nerve deficit.  Psychiatric:        Behavior: Behavior normal.    BP 122/66   Pulse 80   Temp 98 F (36.7 C)   Resp 16   Wt 200 lb (90.7 kg)   SpO2 98%   BMI 27.89 kg/m  Wt Readings from Last 3 Encounters:  03/05/21 200 lb (90.7 kg)  12/16/20 200 lb (90.7 kg)  10/14/20 206 lb 3.2 oz (93.5 kg)     Lab Results  Component Value Date   WBC 8.7 10/14/2020   HGB 11.6 (L) 10/14/2020   HCT 34.3 (L) 10/14/2020   PLT 342.0 10/14/2020   GLUCOSE 107 (H) 10/14/2020   CHOL 97 10/14/2020   TRIG 138.0 10/14/2020   HDL 27.70 (L) 10/14/2020   LDLCALC 42 10/14/2020   ALT 11 10/14/2020   AST 16 10/14/2020   NA 140 10/14/2020   K 5.3 (H) 10/14/2020   CL 101 10/14/2020   CREATININE 1.23 10/14/2020   BUN 26 (H) 10/14/2020   CO2 32 10/14/2020   TSH 1.19 10/14/2020   PSA 8.54 (H) 10/14/2020   INR 1.1 07/31/2008   HGBA1C 7.9 (H) 10/14/2020   MICROALBUR 8.9 (H) 05/02/2018    ECHOCARDIOGRAM COMPLETE  Result Date: 07/04/2020    ECHOCARDIOGRAM REPORT   Patient Name:   HOWIE RUFUS Date of Exam: 07/02/2020 Medical  Rec #:  [id number]        Height:       71.0 in Accession #:    [id number]       Weight:       204.8 lb Date of Birth:  [birth year]         BSA:          2.130 m Patient Age:    79 years         BP:           125/68 mmHg Patient Gender: M                HR:           70 bpm. Exam Location:  Inpatient Procedure: 2D Echo Indications:    cardiomyopathy425.9   History:        Patient has prior history of Echocardiogram examinations, most                 recent 05/23/2018. Prior CABG; Risk Factors:Diabetes, Hypertension                 and Dyslipidemia.  Sonographer:    Johny Chess RDCS Referring Phys: [phone] [place]  1. Left ventricular ejection fraction, by estimation, is 60 to 65%. The left ventricle has normal function. The left ventricle has no regional wall motion abnormalities. There is mild left ventricular hypertrophy. Left ventricular diastolic parameters were normal.  2. Right ventricular systolic function is normal. The right ventricular size is normal. There is normal pulmonary artery systolic pressure. The estimated right ventricular systolic pressure is 73.2 mmHg.  3. The mitral valve is degenerative. Trivial mitral valve regurgitation. No evidence of mitral stenosis.  4. The aortic valve is tricuspid. Aortic valve regurgitation is not visualized. No aortic stenosis is present.  5. The inferior vena cava is normal in size with greater than 50% respiratory variability, suggesting right atrial pressure of 3 mmHg. FINDINGS  Left Ventricle: Left ventricular ejection fraction, by estimation, is 60 to 65%. The left ventricle has normal function. The left ventricle has no regional wall motion abnormalities. The left ventricular internal cavity size was normal in size. There is  mild left ventricular hypertrophy. Left ventricular diastolic parameters were normal. Right Ventricle: The right ventricular size is normal. No increase in right ventricular wall thickness. Right ventricular systolic function is normal. There is normal pulmonary artery systolic pressure. The tricuspid regurgitant velocity is 2.31 m/s, and  with an assumed right atrial pressure of 3 mmHg, the estimated right ventricular systolic pressure is 20.2 mmHg. Left Atrium: Left atrial size was normal in size. Right Atrium: Right atrial size was normal in size. Pericardium:  There is no evidence of pericardial effusion. Mitral Valve: The mitral valve is degenerative in appearance. Normal mobility of the mitral valve leaflets. Moderate mitral annular calcification. Trivial mitral valve regurgitation. No evidence of mitral valve stenosis. The mean mitral valve gradient is  1.8 mmHg with average heart rate of 65 bpm. Tricuspid Valve: The tricuspid valve is normal in structure. Tricuspid valve regurgitation is trivial. No evidence of tricuspid stenosis. Aortic Valve: The aortic valve is tricuspid. Aortic valve regurgitation is not visualized. No aortic stenosis is present. There is mild calcification of the aortic valve. Pulmonic Valve: The pulmonic valve was normal in structure. Pulmonic valve regurgitation is trivial. No evidence of pulmonic stenosis. Aorta: Sinus of Valsalva and mid ascending aorta measure 38 mm which is likely upper limit of normal for  age when indexed to BSA. The aortic root and ascending aorta are structurally normal, with no evidence of dilitation. Venous: The inferior vena cava is normal in size with greater than 50% respiratory variability, suggesting right atrial pressure of 3 mmHg. IAS/Shunts: No atrial level shunt detected by color flow Doppler.  LEFT VENTRICLE PLAX 2D LVIDd:         5.40 cm  Diastology LVIDs:         3.50 cm  LV e' lateral:   7.51 cm/s LV PW:         1.20 cm  LV E/e' lateral: 16.0 LV IVS:        1.20 cm  LV e' medial:    7.62 cm/s LVOT diam:     2.20 cm  LV E/e' medial:  15.7 LV SV:         83 LV SV Index:   39 LVOT Area:     3.80 cm  RIGHT VENTRICLE RV S prime:     12.20 cm/s TAPSE (M-mode): 1.4 cm LEFT ATRIUM             Index       RIGHT ATRIUM           Index LA diam:        4.50 cm 2.11 cm/m  RA Area:     15.80 cm LA Vol (A2C):   72.5 ml 34.04 ml/m RA Volume:   39.30 ml  18.45 ml/m LA Vol (A4C):   64.2 ml 30.14 ml/m LA Biplane Vol: 70.6 ml 33.15 ml/m  AORTIC VALVE LVOT Vmax:   90.70 cm/s LVOT Vmean:  63.900 cm/s LVOT VTI:    0.218 m   AORTA Ao Root diam: 3.80 cm Ao Asc diam:  3.80 cm MITRAL VALVE                TRICUSPID VALVE MV Area (PHT): 2.66 cm     TR Peak grad:   21.3 mmHg MV Mean grad:  1.8 mmHg     TR Vmax:        231.00 cm/s MV Decel Time: 285 msec MV E velocity: 120.00 cm/s  SHUNTS MV A velocity: 115.00 cm/s  Systemic VTI:  0.22 m MV E/A ratio:  1.04         Systemic Diam: 2.20 cm Cherlynn Kaiser MD Electronically signed by Cherlynn Kaiser MD Signature Date/Time: 07/04/2020/8:39:14 AM    Final      Assessment & Plan:  Plan    Meds ordered this encounter  Medications  . amLODipine (NORVASC) 10 MG tablet    Sig: Take 1 tablet (10 mg total) by mouth daily.    Dispense:  90 tablet    Refill:  1  . insulin glargine (LANTUS SOLOSTAR) 100 UNIT/ML Solostar Pen    Sig: Inject 25-35 Units into the skin at bedtime.    Dispense:  30 mL    Refill:  5    Problem List Items Addressed This Visit    Diabetes mellitus type 2 in obese (HCC)    hgba1c acceptable, minimize simple carbs. Increase exercise as tolerated. Continue current meds. Refill given on Lantus      Relevant Medications   insulin glargine (LANTUS SOLOSTAR) 100 UNIT/ML Solostar Pen   Other Relevant Orders   Hemoglobin A1c   Hyperlipidemia, mixed    Encouraged heart healthy diet, increase exercise, avoid trans fats, consider a krill oil cap daily, tolerating Atorvastatin      Relevant Medications  amLODipine (NORVASC) 10 MG tablet   Other Relevant Orders   Lipid panel   H/O tobacco use, presenting hazards to health    Smoked 1/2 ppd for roughly 3 years. Quit in 2014      Essential hypertension   Relevant Medications   amLODipine (NORVASC) 10 MG tablet   Other Relevant Orders   CBC   Comprehensive metabolic panel   TSH   GERD    With worsening gas and bloating. Referred to gastroenterology, avoid offending foods.       Elevated PSA   Relevant Orders   PSA   Hypertension    Well controlled, no changes to meds. Encouraged heart healthy  diet such as the DASH diet and exercise as tolerated.       Relevant Medications   amLODipine (NORVASC) 10 MG tablet   Diarrhea - Primary    Has had roughly 5 loose stool daily for over 6 months. No bloody or tarry stool. Referred to gastroenterology for further evaluation      Relevant Orders   Ambulatory referral to Gastroenterology    Other Visit Diagnoses    Muscle cramp       Relevant Orders   Magnesium      Follow-up: Return in about 6 months (around 09/04/2021) for annual exam.   I,David Hanna,acting as a scribe for Penni Homans, MD.,have documented all relevant documentation on the behalf of Penni Homans, MD,as directed by  Penni Homans, MD while in the presence of Penni Homans, MD.  I, Mosie Lukes, MD personally performed the services described in this documentation. All medical record entries made by the scribe were at my direction and in my presence. I have reviewed the chart and agree that the record reflects my personal performance and is accurate and complete

## 2021-03-05 NOTE — Assessment & Plan Note (Signed)
With worsening gas and bloating. Referred to gastroenterology, avoid offending foods.

## 2021-03-05 NOTE — Assessment & Plan Note (Addendum)
Smoked 1/2 ppd for roughly 3 years. Quit in 2014

## 2021-03-05 NOTE — Assessment & Plan Note (Signed)
Well controlled, no changes to meds. Encouraged heart healthy diet such as the DASH diet and exercise as tolerated.  °

## 2021-03-05 NOTE — Assessment & Plan Note (Signed)
hgba1c acceptable, minimize simple carbs. Increase exercise as tolerated. Continue current meds. Refill given on Lantus

## 2021-03-05 NOTE — Assessment & Plan Note (Signed)
Encouraged heart healthy diet, increase exercise, avoid trans fats, consider a krill oil cap daily, tolerating Atorvastatin

## 2021-03-05 NOTE — Assessment & Plan Note (Signed)
Has had roughly 5 loose stool daily for over 6 months. No bloody or tarry stool. Referred to gastroenterology for further evaluation

## 2021-03-05 NOTE — Assessment & Plan Note (Signed)
Does have episodes of N/V if he over eats otherwise he does.

## 2021-03-06 LAB — LIPID PANEL
Cholesterol: 85 mg/dL (ref 0–200)
HDL: 29.9 mg/dL — ABNORMAL LOW (ref >39.00)
LDL Cholesterol: 32 mg/dL (ref 0–99)
NonHDL: 55.27
Total CHOL/HDL Ratio: 3
Triglycerides: 114 mg/dL (ref 0.0–149.0)
VLDL: 22.8 mg/dL (ref 0.0–40.0)

## 2021-03-06 LAB — PSA: PSA: 8.27 ng/mL — ABNORMAL HIGH (ref 0.10–4.00)

## 2021-03-06 LAB — CBC
HCT: 33.1 % — ABNORMAL LOW (ref 39.0–52.0)
Hemoglobin: 11.2 g/dL — ABNORMAL LOW (ref 13.0–17.0)
MCHC: 33.9 g/dL (ref 30.0–36.0)
MCV: 91.2 fl (ref 78.0–100.0)
Platelets: 362 10*3/uL (ref 150.0–400.0)
RBC: 3.63 Mil/uL — ABNORMAL LOW (ref 4.22–5.81)
RDW: 14.2 % (ref 11.5–15.5)
WBC: 8.3 10*3/uL (ref 4.0–10.5)

## 2021-03-06 LAB — COMPREHENSIVE METABOLIC PANEL
ALT: 11 U/L (ref 0–53)
AST: 14 U/L (ref 0–37)
Albumin: 4.1 g/dL (ref 3.5–5.2)
Alkaline Phosphatase: 81 U/L (ref 39–117)
BUN: 20 mg/dL (ref 6–23)
CO2: 29 mEq/L (ref 19–32)
Calcium: 9.3 mg/dL (ref 8.4–10.5)
Chloride: 101 mEq/L (ref 96–112)
Creatinine, Ser: 1 mg/dL (ref 0.40–1.50)
GFR: 78.63 mL/min (ref >60.00)
Glucose, Bld: 138 mg/dL — ABNORMAL HIGH (ref 70–99)
Potassium: 4.5 mEq/L (ref 3.5–5.1)
Sodium: 140 mEq/L (ref 135–145)
Total Bilirubin: 0.3 mg/dL (ref 0.2–1.2)
Total Protein: 6.7 g/dL (ref 6.0–8.3)

## 2021-03-06 LAB — HEMOGLOBIN A1C: Hgb A1c MFr Bld: 7.5 % — ABNORMAL HIGH (ref 4.6–6.5)

## 2021-03-06 LAB — MAGNESIUM: Magnesium: 1.2 mg/dL — ABNORMAL LOW (ref 1.5–2.5)

## 2021-03-06 LAB — TSH: TSH: 0.91 u[IU]/mL (ref 0.35–4.50)

## 2021-03-10 ENCOUNTER — Ambulatory Visit: Payer: Federal, State, Local not specified - PPO | Admitting: Cardiology

## 2021-03-11 ENCOUNTER — Other Ambulatory Visit: Payer: Self-pay

## 2021-03-11 MED ORDER — MAGNESIUM OXIDE 400 MG PO CAPS: 400.0000 mg | ORAL_CAPSULE | Freq: Every day | ORAL | 3 refills | Status: DC

## 2021-03-12 ENCOUNTER — Other Ambulatory Visit: Payer: Self-pay | Admitting: Family Medicine

## 2021-03-30 ENCOUNTER — Encounter: Payer: Self-pay | Admitting: Gastroenterology

## 2021-04-11 ENCOUNTER — Other Ambulatory Visit: Payer: Self-pay | Admitting: Family Medicine

## 2021-05-13 ENCOUNTER — Other Ambulatory Visit: Payer: Self-pay

## 2021-05-13 ENCOUNTER — Ambulatory Visit (AMBULATORY_SURGERY_CENTER): Payer: Federal, State, Local not specified - PPO

## 2021-05-13 VITALS — Ht 71.0 in | Wt 196.0 lb

## 2021-05-13 DIAGNOSIS — Z86010 Personal history of colonic polyps: Secondary | ICD-10-CM

## 2021-05-13 MED ORDER — GOLYTELY 236 G PO SOLR: 4000.0000 mL | ORAL | 0 refills | Status: DC

## 2021-05-13 NOTE — Progress Notes (Signed)
No egg or soy allergy known to patient  No issues with past sedation with any surgeries or procedures Patient denies ever being told they had issues or difficulty with intubation  No FH of Malignant Hyperthermia No diet pills per patient No home 02 use per patient  No blood thinners per patient  Pt denies issues with constipation  No A fib or A flutter  EMMI video via MyChart  COVID 19 guidelines implemented in [facility] today with Pt and RN  Pt is fully vaccinated for Covid   NO PA's for preps discussed with pt in PV today  Discussed with pt there will be an out-of-pocket cost for prep and that varies from $0 to 70 dollars   Due to the COVID-19 pandemic we are asking patients to follow certain guidelines.  Pt aware of COVID protocols and [facility] guidelines

## 2021-05-24 ENCOUNTER — Other Ambulatory Visit: Payer: Self-pay | Admitting: Family Medicine

## 2021-05-24 DIAGNOSIS — R52 Pain, unspecified: Secondary | ICD-10-CM

## 2021-05-27 ENCOUNTER — Encounter: Payer: Federal, State, Local not specified - PPO | Admitting: Gastroenterology

## 2021-05-29 ENCOUNTER — Other Ambulatory Visit: Payer: Self-pay

## 2021-05-29 ENCOUNTER — Ambulatory Visit (AMBULATORY_SURGERY_CENTER): Payer: Federal, State, Local not specified - PPO | Admitting: Gastroenterology

## 2021-05-29 ENCOUNTER — Encounter: Payer: Self-pay | Admitting: Gastroenterology

## 2021-05-29 VITALS — BP 110/66 | HR 72 | Temp 97.8°F | Resp 15 | Ht 71.0 in | Wt 196.0 lb

## 2021-05-29 DIAGNOSIS — K635 Polyp of colon: Secondary | ICD-10-CM | POA: Diagnosis not present

## 2021-05-29 DIAGNOSIS — D123 Benign neoplasm of transverse colon: Secondary | ICD-10-CM | POA: Diagnosis not present

## 2021-05-29 DIAGNOSIS — Z86010 Personal history of colonic polyps: Secondary | ICD-10-CM | POA: Diagnosis not present

## 2021-05-29 DIAGNOSIS — Z1211 Encounter for screening for malignant neoplasm of colon: Secondary | ICD-10-CM | POA: Diagnosis not present

## 2021-05-29 DIAGNOSIS — D125 Benign neoplasm of sigmoid colon: Secondary | ICD-10-CM

## 2021-05-29 DIAGNOSIS — D124 Benign neoplasm of descending colon: Secondary | ICD-10-CM

## 2021-05-29 MED ORDER — SODIUM CHLORIDE 0.9 % IV SOLN: 500.0000 mL | Freq: Once | INTRAVENOUS | Status: DC

## 2021-05-29 NOTE — Progress Notes (Signed)
Called to room to assist during endoscopic procedure.  Patient ID and intended procedure confirmed with present staff. Received instructions for my participation in the procedure from the performing physician.  

## 2021-05-29 NOTE — Progress Notes (Signed)
PT taken to PACU. Monitors in place. VSS. Report given to RN. 

## 2021-05-29 NOTE — Progress Notes (Signed)
Pt's states no medical or surgical changes since previsit or office visit. 

## 2021-05-29 NOTE — Progress Notes (Signed)
C.w. VITAL SIGNS. 

## 2021-05-29 NOTE — Patient Instructions (Signed)
You will need a repeat colonoscopy in 2 years. Do not be late.  Read all of the handouts given to you by your recovery room nurse.  YOU HAD AN ENDOSCOPIC PROCEDURE TODAY AT [facility]:   Refer to the procedure report that was given to you for any specific questions about what was found during the examination.  If the procedure report does not answer your questions, please call your gastroenterologist to clarify.  If you requested that your care partner not be given the details of your procedure findings, then the procedure report has been included in a sealed envelope for you to review at your convenience later.  YOU SHOULD EXPECT: Some feelings of bloating in the abdomen. Passage of more gas than usual.  Walking can help get rid of the air that was put into your GI tract during the procedure and reduce the bloating. If you had a lower endoscopy (such as a colonoscopy or flexible sigmoidoscopy) you may notice spotting of blood in your stool or on the toilet paper. If you underwent a bowel prep for your procedure, you may not have a normal bowel movement for a few days.  Please Note:  You might notice some irritation and congestion in your nose or some drainage.  This is from the oxygen used during your procedure.  There is no need for concern and it should clear up in a day or so.  SYMPTOMS TO REPORT IMMEDIATELY:  Following lower endoscopy (colonoscopy or flexible sigmoidoscopy):  Excessive amounts of blood in the stool  Significant tenderness or worsening of abdominal pains  Swelling of the abdomen that is new, acute  Fever of 100F or higher   For urgent or emergent issues, a gastroenterologist can be reached at any hour by calling [phone]. Do not use MyChart messaging for urgent concerns.    DIET:  We do recommend a small meal at first, but then you may proceed to your regular diet.  Drink plenty of fluids but you should avoid alcoholic beverages for 24  hours.  ACTIVITY:  You should plan to take it easy for the rest of today and you should NOT DRIVE or use heavy machinery until tomorrow (because of the sedation medicines used during the test).    FOLLOW UP: Our staff will call the number listed on your records 48-72 hours following your procedure to check on you and address any questions or concerns that you may have regarding the information given to you following your procedure. If we do not reach you, we will leave a message.  We will attempt to reach you two times.  During this call, we will ask if you have developed any symptoms of COVID 19. If you develop any symptoms (ie: fever, flu-like symptoms, shortness of breath, cough etc.) before then, please call [phone].  If you test positive for Covid 19 in the 2 weeks post procedure, please call and report this information to [place].    If any biopsies were taken you will be contacted by phone or by letter within the next 1-3 weeks.  Please call us at [phone] if you have not heard about the biopsies in 3 weeks.    SIGNATURES/CONFIDENTIALITY: You and/or your care partner have signed paperwork which will be entered into your electronic medical record.  These signatures attest to the fact that that the information above on your After Visit Summary has been reviewed and is understood.  Full responsibility of the confidentiality of  this discharge information lies with you and/or your care-partner.

## 2021-05-29 NOTE — Op Note (Addendum)
[place] Patient Name: Gene [name] Procedure Date: 05/29/2021 2:33 PM MRN: [id number] Endoscopist: Ladene Artist , MD Age: 65 Referring MD:  Date of Birth: Jul 25, 1956 Gender: Male Account #: [id number] Procedure:                Colonoscopy Indications:              Surveillance: Personal history of adenomatous                            polyps on last colonoscopy 3 years ago Medicines:                Monitored Anesthesia Care Procedure:                Pre-Anesthesia Assessment:                           - Prior to the procedure, a History and Physical                            was performed, and patient medications and                            allergies were reviewed. The patient's tolerance of                            previous anesthesia was also reviewed. The risks                            and benefits of the procedure and the sedation                            options and risks were discussed with the patient.                            All questions were answered, and informed consent                            was obtained. Prior Anticoagulants: The patient has                            taken no previous anticoagulant or antiplatelet                            agents. ASA Grade Assessment: III - A patient with                            severe systemic disease. After reviewing the risks                            and benefits, the patient was deemed in                            satisfactory condition to undergo the procedure.  After obtaining informed consent, the colonoscope                            was passed under direct vision. Throughout the                            procedure, the patient's blood pressure, pulse, and                            oxygen saturations were monitored continuously. The                            PCF-HQ190L Colonoscope was introduced through the                            anus and advanced to  the the cecum, identified by                            appendiceal orifice and ileocecal valve. The                            ileocecal valve, appendiceal orifice, and rectum                            were photographed. The quality of the bowel                            preparation was fair despite extensive lavage and                            suction. The colonoscopy was performed without                            difficulty. The patient tolerated the procedure                            well. Scope In: 2:38:19 PM Scope Out: 3:08:01 PM Scope Withdrawal Time: 0 hours 26 minutes 22 seconds  Total Procedure Duration: 0 hours 29 minutes 42 seconds  Findings:                 The perianal and digital rectal examinations were                            normal.                           Five sessile polyps were found in the transverse                            colon. The polyps were 6 to 8 mm in size. These                            polyps were removed with a cold snare. Resection  and retrieval were complete.                           Seven sessile polyps were found in the sigmoid                            colon (5) and descending colon (2). The polyps were                            5 to 7 mm in size. These polyps were removed with a                            cold snare. Resection and retrieval were complete.                           Internal hemorrhoids were found during                            retroflexion. The hemorrhoids were moderate and                            Grade I (internal hemorrhoids that do not prolapse).                           The exam was otherwise without abnormality on                            direct and retroflexion views.                           A tattoo was seen in the transverse colon. A                            post-polypectomy scar was found at the tattoo site.                            There was no evidence of  residual polyp tissue. Complications:            No immediate complications. Estimated blood loss:                            None. Estimated Blood Loss:     Estimated blood loss: none. Impression:               - Preparation of the colon was fair.                           - Five 6 to 8 mm polyps in the transverse colon,                            removed with a cold snare. Resected and retrieved.                           - Seven 5 to 7 mm polyps  in the sigmoid colon and                            in the descending colon, removed with a cold snare.                            Resected and retrieved.                           - Internal hemorrhoids.                           - The examination was otherwise normal on direct                            and retroflexion views.                           - A tattoo was seen in the transverse colon. A                            post-polypectomy scar was found at the tattoo site.                            There was no evidence of residual polyp tissue. Recommendation:           - Repeat colonoscopy in 2 years for surveillance                            with a more extensive bowel prep.                           - Patient has a contact number available for                            emergencies. The signs and symptoms of potential                            delayed complications were discussed with the                            patient. Return to normal activities tomorrow.                            Written discharge instructions were provided to the                            patient.                           - Resume previous diet.                           - Continue present medications.                           -  Await pathology results. Ladene Artist, MD 05/29/2021 3:12:50 PM This report has been signed electronically.

## 2021-06-02 ENCOUNTER — Telehealth: Payer: Self-pay

## 2021-06-02 ENCOUNTER — Telehealth: Payer: Self-pay | Admitting: *Deleted

## 2021-06-02 NOTE — Telephone Encounter (Signed)
Attempted f/u phone call. No answer. Left message. °

## 2021-06-02 NOTE — Telephone Encounter (Signed)
  Follow up Call-  Call back number 05/29/2021  Post procedure Call Back phone  # [phone]  Permission to leave phone message Yes  Some recent data might be hidden     Patient questions:  Do you have a fever, pain , or abdominal swelling? No. Pain Score  0 *  Have you tolerated food without any problems? Yes.    Have you been able to return to your normal activities? Yes.    Do you have any questions about your discharge instructions: Diet   No. Medications  No. Follow up visit  No.  Do you have questions or concerns about your Care? No.  Actions: * If pain score is 4 or above: No action needed, pain <4.

## 2021-06-10 ENCOUNTER — Encounter: Payer: Self-pay | Admitting: Gastroenterology

## 2021-06-15 DIAGNOSIS — M1990 Unspecified osteoarthritis, unspecified site: Secondary | ICD-10-CM | POA: Insufficient documentation

## 2021-06-22 ENCOUNTER — Other Ambulatory Visit: Payer: Self-pay

## 2021-06-22 ENCOUNTER — Telehealth: Payer: Self-pay

## 2021-06-22 ENCOUNTER — Ambulatory Visit: Payer: Federal, State, Local not specified - PPO | Admitting: Cardiology

## 2021-06-22 ENCOUNTER — Encounter: Payer: Self-pay | Admitting: Cardiology

## 2021-06-22 VITALS — BP 110/60 | HR 69 | Ht 71.0 in | Wt 200.0 lb

## 2021-06-22 DIAGNOSIS — I739 Peripheral vascular disease, unspecified: Secondary | ICD-10-CM | POA: Diagnosis not present

## 2021-06-22 DIAGNOSIS — E669 Obesity, unspecified: Secondary | ICD-10-CM

## 2021-06-22 DIAGNOSIS — E1169 Type 2 diabetes mellitus with other specified complication: Secondary | ICD-10-CM

## 2021-06-22 DIAGNOSIS — Z951 Presence of aortocoronary bypass graft: Secondary | ICD-10-CM

## 2021-06-22 DIAGNOSIS — E782 Mixed hyperlipidemia: Secondary | ICD-10-CM

## 2021-06-22 DIAGNOSIS — I10 Essential (primary) hypertension: Secondary | ICD-10-CM | POA: Diagnosis not present

## 2021-06-22 NOTE — Addendum Note (Signed)
Addended by: Darrel Reach on: 06/22/2021 04:38 PM   Modules accepted: Orders

## 2021-06-22 NOTE — Telephone Encounter (Signed)
New message   Patient at the front desk asking for magnesium blood work order to be enter into the system.

## 2021-06-22 NOTE — Telephone Encounter (Signed)
Patient would like his magnesium checked. He started the magnesium back in April and stopped on July15th because he had severe diarrhea.  He has now had an colonoscopy and has been doing better.  He would like to check the level to see where it is at to see if he needs the magnesium.  He has some magnesium '250mg'$  and if he needs to start back can he start it.  Are you ok with ordering?

## 2021-06-22 NOTE — Patient Instructions (Signed)

## 2021-06-22 NOTE — Progress Notes (Signed)
Cardiology Office Note:    Date:  06/22/2021   ID:  Gene [name], DOB Sep 29, 1956, MRN [id number]  PCP:  Mosie Lukes, MD  Cardiologist:  Jenne Campus, MD    Referring MD: Mosie Lukes, MD   Chief Complaint  Patient presents with   Follow-up  I am doing fine  History of Present Illness:    Gene [name] is a 65 y.o. male with past medical history significant for coronary artery disease, status post core antibody as graft many years ago.  Last evaluation of coronary artery was done but cardiac catheterization from 2019 and description of this cardiac catheterization as below.  At that time no target lesion has been identified for intervention.  His past medical history is also significant for diabetes mellitus, essential hypertension, dyslipidemia, obesity. He comes today 2 months follow-up.  Overall he seems to be doing fine denies have any cardiac complaints.  There is no chest pain tightness squeezing pressure burning chest no palpitations no dizziness no swelling of lower extremities.  He complains however of buttock pain when he walks a lot again we brought the issue of potential investigating this for potential fixing however he said he prefers conservative approach.  Therefore I asked him to be more active.  He was still working at [employer] however recently got fired and he is very upset about that.  He is looking for a new job.  Past Medical History:  Diagnosis Date   Abnormal stress test 06/01/2018   Ischemia involving LAD territory   Alcohol abuse, episodic 08/07/2008   Qualifier: Diagnosis of  By: Redmond Pulling MD, LauraLee     Anemia    Angina pectoris ([place])    Arthritis    CAD (coronary artery disease)    Chronic pain in right foot    COLONIC POLYPS, HX OF 08/07/2008   Qualifier: Diagnosis of  By: Lorrin Mais     Coronary artery disease 08/07/2008   Qualifier: Diagnosis of  By: Lorrin Mais     Diabetes [facility])    Diabetes mellitus type  2 in obese ([place]) 08/07/2008   Qualifier: Diagnosis of  By: Donney Rankins RMA, Lisa      Diabetes mellitus type II    Diabetic neuropathy ([place])    Dog bite of calf, right, initial encounter 12/25/2018   Elevated PSA 05/03/2014   Essential hypertension 08/07/2008   Qualifier: Diagnosis of  By: Donney Rankins RMA, Lisa     FOOT PAIN, RIGHT 09/10/2008   Qualifier: Diagnosis of  By: Redmond Pulling MD, LauraLee     GERD 08/07/2008   Qualifier: Diagnosis of  By: Lorrin Mais     GERD (gastroesophageal reflux disease)    H/O tobacco use, presenting hazards to health 05/21/2009   Qualifier: Diagnosis of  By: Wynona Luna  Last cigarette smoked May 30, 2015     History of colonic polyps    Hx of CABG 2000   Hyperlipidemia    on meds   Hyperlipidemia, mixed 08/07/2008   Qualifier: Diagnosis of  By: Donney Rankins RMA, Lisa     Hypertension    on meds   Hypomagnesemia    Insomnia 03/25/2013   Left knee pain 11/03/2015   Myocardial infarction [facility]) 2006   Neuromuscular disorder ([place])    NEUROPATHY   Obesity 06/24/2016   Peripheral neuropathy 09/10/2008   Qualifier: Diagnosis of  By: Redmond Pulling MD, LauraLee     PERIPHERAL VASCULAR DISEASE 08/07/2008   Qualifier:  Diagnosis of  By: Lorrin Mais     Plantar fasciitis    Preventative health care 05/03/2014   Psoriasis 05/03/2014   PVD (peripheral vascular disease) ([place])    PTA RLE 12/08 [place]   SOB (shortness of breath) on exertion 05/02/2018   Status post coronary artery bypass graft 05/11/2018   2000   TOBACCO USER 05/21/2009   Qualifier: Diagnosis of  By: Wynona Luna  Last cigarette smoked May 30, 2015      Past Surgical History:  Procedure Laterality Date   CORONARY ARTERY BYPASS GRAFT  2000   200 SVG to diagonal, LIMA to LAD, SVG to LCX   LEFT HEART CATH AND CORS/GRAFTS ANGIOGRAPHY N/A 06/08/2018   Procedure: LEFT HEART CATH AND CORS/GRAFTS ANGIOGRAPHY;  Surgeon: Belva Crome, MD;  Location: [place] [facility];  Service:  Cardiovascular;  Laterality: N/A;   REVISION TOTAL KNEE ARTHROPLASTY Right 2009   TONSILLECTOMY AND ADENOIDECTOMY     VASECTOMY     WISDOM TOOTH EXTRACTION      Current Medications: No outpatient medications have been marked as taking for the 06/22/21 encounter (Office Visit) with Park Liter, MD.     Allergies:   Patient has no known allergies.   Social History   Socioeconomic History   Marital status: Married    Spouse name: Not on file   Number of children: Not on file   Years of education: Not on file   Highest education level: Not on file  Occupational History   Not on file  Tobacco Use   Smoking status: Former    Packs/day: 0.50    Years: 40.00    Pack years: 20.00    Types: Cigarettes    Quit date: 05/04/2015    Years since quitting: 6.1   Smokeless tobacco: Never  Vaping Use   Vaping Use: Never used  Substance and Sexual Activity   Alcohol use: No    Alcohol/week: 0.0 standard drinks   Drug use: No    Comment: rare   Sexual activity: Yes    Comment: lives with wife, retired   Other Topics Concern   Not on file  Social History Narrative   Occupation: TEFL teacher   Second marriage - 3 children from first marriage   Tobacco Use - Yes.     Alcohol Use - yes(rare)           Social Determinants of Health   Financial Resource Strain: Not on file  Food Insecurity: Not on file  Transportation Needs: Not on file  Physical Activity: Not on file  Stress: Not on file  Social Connections: Not on file     Family History: The patient's family history includes Aneurysm in his father; Cancer in his maternal uncle; Coronary artery disease in his mother; Hyperlipidemia in his father and another family member; Hypertension in his mother and another family member; Prostate cancer in his brother; Stroke in an other family member. There is no history of Colon cancer, Esophageal cancer, Liver cancer, Pancreatic cancer, Rectal cancer, Stomach cancer, or Colon  polyps. ROS:   Please see the history of present illness.    All 14 point review of systems negative except as described per history of present illness  EKGs/Labs/Other Studies Reviewed:    Cardiac catheterization from 2019 showed: Severe native vessel coronary artery disease with total occlusion of the proximal to mid LAD, high-grade obstruction and a small and diffusely diseased diagonal #1, and severe  diffuse disease throughout the mid to distal circumflex and obtuse marginal territory. Left main is widely patent Right coronary artery is dominant and contains eccentric 50% mid vessel narrowing unchanged compared to 10 years ago.  Also supplied to septal perforators mid LAD segment which is not fed retrograde by the distal LAD. LIMA to the distal LAD Saphenous vein graft to the diagonal Occluded saphenous vein graft to the circumflex/marginal LVEF 50% with anterolateral wall motion abnormality.  EF 50% with upper normal LVEDP.   Recent Labs: 03/05/2021: ALT 11; BUN 20; Creatinine, Ser 1.00; Hemoglobin 11.2; Magnesium 1.2; Platelets 362.0; Potassium 4.5; Sodium 140; TSH 0.91  Recent Lipid Panel    Component Value Date/Time   CHOL 85 03/05/2021 1401   CHOL 101 02/04/2020 1652   TRIG 114.0 03/05/2021 1401   HDL 29.90 (L) 03/05/2021 1401   HDL 31 (L) 02/04/2020 1652   CHOLHDL 3 03/05/2021 1401   VLDL 22.8 03/05/2021 1401   LDLCALC 32 03/05/2021 1401   LDLCALC 48 02/04/2020 1652    Physical Exam:    VS:  BP 110/60 (BP Location: Right Arm, Patient Position: Sitting)   Pulse 69   Ht '5\' 11"'$  (1.803 m)   Wt 200 lb (90.7 kg)   SpO2 99%   BMI 27.89 kg/m     Wt Readings from Last 3 Encounters:  06/22/21 200 lb (90.7 kg)  05/29/21 196 lb (88.9 kg)  05/13/21 196 lb (88.9 kg)     GEN:  Well nourished, well developed in no acute distress HEENT: Normal NECK: No JVD; No carotid bruits LYMPHATICS: No lymphadenopathy CARDIAC: RRR, no murmurs, no rubs, no gallops RESPIRATORY:  Clear  to auscultation without rales, wheezing or rhonchi  ABDOMEN: Soft, non-tender, non-distended MUSCULOSKELETAL:  No edema; No deformity  SKIN: Warm and dry LOWER EXTREMITIES: no swelling NEUROLOGIC:  Alert and oriented x 3 PSYCHIATRIC:  Normal affect   ASSESSMENT:    1. Status post coronary artery bypass graft   2. Essential hypertension   3. PERIPHERAL VASCULAR DISEASE   4. Primary hypertension   5. Diabetes mellitus type 2 in obese ([place])   6. Hyperlipidemia, mixed    PLAN:    In order of problems listed above:  Status post coronary bypass graft years ago seems to be doing well from that point review continue present management. Essential hypertension blood pressure at target 110/60 today we will continue present management. Peripheral vascular disease managed with antiplatelet therapy in form of aspirin as well as Lipitor.  We will continue.  His symptoms are suggestive of claudication probably buttock claudication however he does not want to do anything about his symptoms of aggressive management.  I told him if he changes mind he needs to let me know. Dyslipidemia he is on high intensity statin form of Lipitor 40 daily which I will continue I did review K PN data done by primary care physician showing LDL of 32 HDL 29 I will continue present management. Diabetes mellitus.  Last hemoglobin A1c from April 21 it is elevated 7.5 I talked to him about need to talk to primary care physician to get his diabetes better control. Will get to talk about healthy lifestyle need to exercise and regular basis it well he is trying to do that.   Medication Adjustments/Labs and Tests Ordered: Current medicines are reviewed at length with the patient today.  Concerns regarding medicines are outlined above.  No orders of the defined types were placed in this encounter.  Medication changes:  No orders of the defined types were placed in this encounter.   Signed, Park Liter, MD,  [facility] 06/22/2021 8:14 AM    [place]

## 2021-06-23 ENCOUNTER — Other Ambulatory Visit: Payer: Self-pay

## 2021-06-23 DIAGNOSIS — D649 Anemia, unspecified: Secondary | ICD-10-CM

## 2021-06-23 DIAGNOSIS — E1169 Type 2 diabetes mellitus with other specified complication: Secondary | ICD-10-CM

## 2021-06-23 DIAGNOSIS — I10 Essential (primary) hypertension: Secondary | ICD-10-CM

## 2021-06-23 DIAGNOSIS — R197 Diarrhea, unspecified: Secondary | ICD-10-CM

## 2021-06-23 DIAGNOSIS — E782 Mixed hyperlipidemia: Secondary | ICD-10-CM

## 2021-06-23 DIAGNOSIS — E669 Obesity, unspecified: Secondary | ICD-10-CM

## 2021-06-23 NOTE — Telephone Encounter (Signed)
LVM to call back.

## 2021-06-23 NOTE — Telephone Encounter (Signed)
Lab in and appointment set

## 2021-06-29 ENCOUNTER — Other Ambulatory Visit (INDEPENDENT_AMBULATORY_CARE_PROVIDER_SITE_OTHER): Payer: Federal, State, Local not specified - PPO

## 2021-06-29 ENCOUNTER — Other Ambulatory Visit: Payer: Self-pay

## 2021-06-29 DIAGNOSIS — E669 Obesity, unspecified: Secondary | ICD-10-CM | POA: Diagnosis not present

## 2021-06-29 DIAGNOSIS — I10 Essential (primary) hypertension: Secondary | ICD-10-CM | POA: Diagnosis not present

## 2021-06-29 DIAGNOSIS — D649 Anemia, unspecified: Secondary | ICD-10-CM

## 2021-06-29 DIAGNOSIS — E782 Mixed hyperlipidemia: Secondary | ICD-10-CM | POA: Diagnosis not present

## 2021-06-29 DIAGNOSIS — R197 Diarrhea, unspecified: Secondary | ICD-10-CM | POA: Diagnosis not present

## 2021-06-29 DIAGNOSIS — E1169 Type 2 diabetes mellitus with other specified complication: Secondary | ICD-10-CM | POA: Diagnosis not present

## 2021-06-29 LAB — CBC WITH DIFFERENTIAL/PLATELET
Basophils Absolute: 0 10*3/uL (ref 0.0–0.1)
Basophils Relative: 0.4 % (ref 0.0–3.0)
Eosinophils Absolute: 0.1 10*3/uL (ref 0.0–0.7)
Eosinophils Relative: 1.5 % (ref 0.0–5.0)
HCT: 33.1 % — ABNORMAL LOW (ref 39.0–52.0)
Hemoglobin: 11.1 g/dL — ABNORMAL LOW (ref 13.0–17.0)
Lymphocytes Relative: 35.4 % (ref 12.0–46.0)
Lymphs Abs: 2.5 10*3/uL (ref 0.7–4.0)
MCHC: 33.7 g/dL (ref 30.0–36.0)
MCV: 90.3 fl (ref 78.0–100.0)
Monocytes Absolute: 0.5 10*3/uL (ref 0.1–1.0)
Monocytes Relative: 6.5 % (ref 3.0–12.0)
Neutro Abs: 4 10*3/uL (ref 1.4–7.7)
Neutrophils Relative %: 56.2 % (ref 43.0–77.0)
Platelets: 306 10*3/uL (ref 150.0–400.0)
RBC: 3.66 Mil/uL — ABNORMAL LOW (ref 4.22–5.81)
RDW: 14.6 % (ref 11.5–15.5)
WBC: 7.1 10*3/uL (ref 4.0–10.5)

## 2021-06-29 LAB — MAGNESIUM: Magnesium: 1.3 mg/dL — ABNORMAL LOW (ref 1.5–2.5)

## 2021-06-29 LAB — LIPID PANEL
Cholesterol: 100 mg/dL (ref 0–200)
HDL: 29.7 mg/dL — ABNORMAL LOW (ref >39.00)
LDL Cholesterol: 44 mg/dL (ref 0–99)
NonHDL: 70.65
Total CHOL/HDL Ratio: 3
Triglycerides: 131 mg/dL (ref 0.0–149.0)
VLDL: 26.2 mg/dL (ref 0.0–40.0)

## 2021-06-29 LAB — TSH: TSH: 1.3 u[IU]/mL (ref 0.35–5.50)

## 2021-06-29 LAB — HEMOGLOBIN A1C: Hgb A1c MFr Bld: 7.7 % — ABNORMAL HIGH (ref 4.6–6.5)

## 2021-07-03 ENCOUNTER — Other Ambulatory Visit: Payer: Self-pay | Admitting: Cardiology

## 2021-07-20 ENCOUNTER — Other Ambulatory Visit: Payer: Self-pay | Admitting: Family Medicine

## 2021-07-20 ENCOUNTER — Other Ambulatory Visit: Payer: Self-pay | Admitting: Cardiology

## 2021-08-17 ENCOUNTER — Other Ambulatory Visit: Payer: Self-pay | Admitting: Family Medicine

## 2021-08-21 DIAGNOSIS — M7989 Other specified soft tissue disorders: Secondary | ICD-10-CM | POA: Diagnosis not present

## 2021-08-21 DIAGNOSIS — Z95820 Peripheral vascular angioplasty status with implants and grafts: Secondary | ICD-10-CM | POA: Diagnosis not present

## 2021-08-21 DIAGNOSIS — L0889 Other specified local infections of the skin and subcutaneous tissue: Secondary | ICD-10-CM | POA: Diagnosis not present

## 2021-08-21 DIAGNOSIS — Z951 Presence of aortocoronary bypass graft: Secondary | ICD-10-CM | POA: Diagnosis not present

## 2021-08-21 DIAGNOSIS — E11621 Type 2 diabetes mellitus with foot ulcer: Secondary | ICD-10-CM | POA: Diagnosis not present

## 2021-08-21 DIAGNOSIS — E1151 Type 2 diabetes mellitus with diabetic peripheral angiopathy without gangrene: Secondary | ICD-10-CM | POA: Diagnosis not present

## 2021-08-21 DIAGNOSIS — E785 Hyperlipidemia, unspecified: Secondary | ICD-10-CM | POA: Diagnosis not present

## 2021-08-21 DIAGNOSIS — I70201 Unspecified atherosclerosis of native arteries of extremities, right leg: Secondary | ICD-10-CM | POA: Diagnosis not present

## 2021-08-21 DIAGNOSIS — E1152 Type 2 diabetes mellitus with diabetic peripheral angiopathy with gangrene: Secondary | ICD-10-CM | POA: Diagnosis not present

## 2021-08-21 DIAGNOSIS — E11628 Type 2 diabetes mellitus with other skin complications: Secondary | ICD-10-CM | POA: Diagnosis not present

## 2021-08-21 DIAGNOSIS — M869 Osteomyelitis, unspecified: Secondary | ICD-10-CM | POA: Diagnosis not present

## 2021-08-21 DIAGNOSIS — L97529 Non-pressure chronic ulcer of other part of left foot with unspecified severity: Secondary | ICD-10-CM | POA: Diagnosis not present

## 2021-08-21 DIAGNOSIS — I2510 Atherosclerotic heart disease of native coronary artery without angina pectoris: Secondary | ICD-10-CM | POA: Diagnosis not present

## 2021-08-21 DIAGNOSIS — I70262 Atherosclerosis of native arteries of extremities with gangrene, left leg: Secondary | ICD-10-CM | POA: Diagnosis not present

## 2021-08-21 DIAGNOSIS — L089 Local infection of the skin and subcutaneous tissue, unspecified: Secondary | ICD-10-CM | POA: Diagnosis not present

## 2021-08-21 DIAGNOSIS — E1169 Type 2 diabetes mellitus with other specified complication: Secondary | ICD-10-CM | POA: Diagnosis not present

## 2021-08-21 DIAGNOSIS — Z794 Long term (current) use of insulin: Secondary | ICD-10-CM | POA: Diagnosis not present

## 2021-08-21 DIAGNOSIS — Z79899 Other long term (current) drug therapy: Secondary | ICD-10-CM | POA: Diagnosis not present

## 2021-08-21 DIAGNOSIS — I70293 Other atherosclerosis of native arteries of extremities, bilateral legs: Secondary | ICD-10-CM | POA: Diagnosis not present

## 2021-08-21 DIAGNOSIS — F17210 Nicotine dependence, cigarettes, uncomplicated: Secondary | ICD-10-CM | POA: Diagnosis not present

## 2021-08-21 DIAGNOSIS — L03032 Cellulitis of left toe: Secondary | ICD-10-CM | POA: Diagnosis not present

## 2021-08-21 DIAGNOSIS — L97829 Non-pressure chronic ulcer of other part of left lower leg with unspecified severity: Secondary | ICD-10-CM | POA: Diagnosis not present

## 2021-08-21 DIAGNOSIS — I10 Essential (primary) hypertension: Secondary | ICD-10-CM | POA: Diagnosis not present

## 2021-08-22 DIAGNOSIS — L97528 Non-pressure chronic ulcer of other part of left foot with other specified severity: Secondary | ICD-10-CM | POA: Diagnosis not present

## 2021-08-22 DIAGNOSIS — L97529 Non-pressure chronic ulcer of other part of left foot with unspecified severity: Secondary | ICD-10-CM | POA: Diagnosis not present

## 2021-08-22 DIAGNOSIS — E11621 Type 2 diabetes mellitus with foot ulcer: Secondary | ICD-10-CM | POA: Diagnosis not present

## 2021-08-22 DIAGNOSIS — I739 Peripheral vascular disease, unspecified: Secondary | ICD-10-CM | POA: Diagnosis not present

## 2021-08-22 DIAGNOSIS — I10 Essential (primary) hypertension: Secondary | ICD-10-CM | POA: Diagnosis not present

## 2021-08-23 DIAGNOSIS — L97528 Non-pressure chronic ulcer of other part of left foot with other specified severity: Secondary | ICD-10-CM | POA: Diagnosis not present

## 2021-08-23 DIAGNOSIS — I739 Peripheral vascular disease, unspecified: Secondary | ICD-10-CM | POA: Diagnosis not present

## 2021-08-24 DIAGNOSIS — R000 Tachycardia, unspecified: Secondary | ICD-10-CM | POA: Diagnosis not present

## 2021-08-28 DIAGNOSIS — I739 Peripheral vascular disease, unspecified: Secondary | ICD-10-CM | POA: Diagnosis not present

## 2021-08-31 DIAGNOSIS — R972 Elevated prostate specific antigen [PSA]: Secondary | ICD-10-CM | POA: Diagnosis not present

## 2021-09-02 DIAGNOSIS — E1151 Type 2 diabetes mellitus with diabetic peripheral angiopathy without gangrene: Secondary | ICD-10-CM | POA: Diagnosis not present

## 2021-09-02 DIAGNOSIS — I96 Gangrene, not elsewhere classified: Secondary | ICD-10-CM | POA: Diagnosis not present

## 2021-09-02 DIAGNOSIS — Z7984 Long term (current) use of oral hypoglycemic drugs: Secondary | ICD-10-CM | POA: Diagnosis not present

## 2021-09-03 DIAGNOSIS — I70245 Atherosclerosis of native arteries of left leg with ulceration of other part of foot: Secondary | ICD-10-CM | POA: Diagnosis not present

## 2021-09-03 DIAGNOSIS — L97522 Non-pressure chronic ulcer of other part of left foot with fat layer exposed: Secondary | ICD-10-CM | POA: Diagnosis not present

## 2021-09-03 DIAGNOSIS — I96 Gangrene, not elsewhere classified: Secondary | ICD-10-CM

## 2021-09-03 DIAGNOSIS — E1151 Type 2 diabetes mellitus with diabetic peripheral angiopathy without gangrene: Secondary | ICD-10-CM | POA: Insufficient documentation

## 2021-09-03 DIAGNOSIS — I779 Disorder of arteries and arterioles, unspecified: Secondary | ICD-10-CM

## 2021-09-03 HISTORY — DX: Gangrene, not elsewhere classified: I96

## 2021-09-03 HISTORY — DX: Type 2 diabetes mellitus with diabetic peripheral angiopathy without gangrene: E11.51

## 2021-09-03 HISTORY — DX: Disorder of arteries and arterioles, unspecified: I77.9

## 2021-09-08 DIAGNOSIS — Z794 Long term (current) use of insulin: Secondary | ICD-10-CM | POA: Diagnosis not present

## 2021-09-08 DIAGNOSIS — S91109A Unspecified open wound of unspecified toe(s) without damage to nail, initial encounter: Secondary | ICD-10-CM | POA: Diagnosis not present

## 2021-09-08 DIAGNOSIS — L97922 Non-pressure chronic ulcer of unspecified part of left lower leg with fat layer exposed: Secondary | ICD-10-CM | POA: Diagnosis not present

## 2021-09-08 DIAGNOSIS — I779 Disorder of arteries and arterioles, unspecified: Secondary | ICD-10-CM | POA: Diagnosis not present

## 2021-09-08 DIAGNOSIS — L97522 Non-pressure chronic ulcer of other part of left foot with fat layer exposed: Secondary | ICD-10-CM | POA: Diagnosis not present

## 2021-09-08 DIAGNOSIS — E11621 Type 2 diabetes mellitus with foot ulcer: Secondary | ICD-10-CM | POA: Diagnosis not present

## 2021-09-08 DIAGNOSIS — I70245 Atherosclerosis of native arteries of left leg with ulceration of other part of foot: Secondary | ICD-10-CM | POA: Diagnosis not present

## 2021-09-08 DIAGNOSIS — I739 Peripheral vascular disease, unspecified: Secondary | ICD-10-CM | POA: Diagnosis not present

## 2021-09-14 DIAGNOSIS — I739 Peripheral vascular disease, unspecified: Secondary | ICD-10-CM | POA: Diagnosis not present

## 2021-09-14 DIAGNOSIS — Z9862 Peripheral vascular angioplasty status: Secondary | ICD-10-CM

## 2021-09-14 DIAGNOSIS — E11621 Type 2 diabetes mellitus with foot ulcer: Secondary | ICD-10-CM | POA: Diagnosis not present

## 2021-09-14 DIAGNOSIS — L97522 Non-pressure chronic ulcer of other part of left foot with fat layer exposed: Secondary | ICD-10-CM | POA: Diagnosis not present

## 2021-09-14 HISTORY — DX: Peripheral vascular angioplasty status: Z98.62

## 2021-09-21 DIAGNOSIS — E11621 Type 2 diabetes mellitus with foot ulcer: Secondary | ICD-10-CM | POA: Diagnosis not present

## 2021-09-21 DIAGNOSIS — E1151 Type 2 diabetes mellitus with diabetic peripheral angiopathy without gangrene: Secondary | ICD-10-CM | POA: Diagnosis not present

## 2021-09-21 DIAGNOSIS — Z9862 Peripheral vascular angioplasty status: Secondary | ICD-10-CM | POA: Diagnosis not present

## 2021-09-21 DIAGNOSIS — L97522 Non-pressure chronic ulcer of other part of left foot with fat layer exposed: Secondary | ICD-10-CM | POA: Diagnosis not present

## 2021-09-28 ENCOUNTER — Encounter (HOSPITAL_BASED_OUTPATIENT_CLINIC_OR_DEPARTMENT_OTHER): Payer: Federal, State, Local not specified - PPO | Admitting: Internal Medicine

## 2021-10-06 ENCOUNTER — Other Ambulatory Visit: Payer: Self-pay | Admitting: Family Medicine

## 2021-10-06 ENCOUNTER — Other Ambulatory Visit: Payer: Self-pay | Admitting: Cardiology

## 2021-10-19 DIAGNOSIS — Z9862 Peripheral vascular angioplasty status: Secondary | ICD-10-CM | POA: Diagnosis not present

## 2021-10-19 DIAGNOSIS — E11621 Type 2 diabetes mellitus with foot ulcer: Secondary | ICD-10-CM | POA: Diagnosis not present

## 2021-10-19 DIAGNOSIS — L97522 Non-pressure chronic ulcer of other part of left foot with fat layer exposed: Secondary | ICD-10-CM | POA: Diagnosis not present

## 2021-10-19 DIAGNOSIS — E1151 Type 2 diabetes mellitus with diabetic peripheral angiopathy without gangrene: Secondary | ICD-10-CM | POA: Diagnosis not present

## 2021-10-20 ENCOUNTER — Encounter: Payer: Self-pay | Admitting: Family Medicine

## 2021-10-20 ENCOUNTER — Ambulatory Visit (INDEPENDENT_AMBULATORY_CARE_PROVIDER_SITE_OTHER): Payer: BC Managed Care – PPO | Admitting: Family Medicine

## 2021-10-20 VITALS — BP 142/82 | HR 81 | Temp 98.1°F | Resp 16 | Ht 71.0 in | Wt 193.6 lb

## 2021-10-20 DIAGNOSIS — E669 Obesity, unspecified: Secondary | ICD-10-CM

## 2021-10-20 DIAGNOSIS — Z23 Encounter for immunization: Secondary | ICD-10-CM | POA: Diagnosis not present

## 2021-10-20 DIAGNOSIS — E782 Mixed hyperlipidemia: Secondary | ICD-10-CM | POA: Diagnosis not present

## 2021-10-20 DIAGNOSIS — D649 Anemia, unspecified: Secondary | ICD-10-CM

## 2021-10-20 DIAGNOSIS — L97529 Non-pressure chronic ulcer of other part of left foot with unspecified severity: Secondary | ICD-10-CM

## 2021-10-20 DIAGNOSIS — Z0000 Encounter for general adult medical examination without abnormal findings: Secondary | ICD-10-CM | POA: Diagnosis not present

## 2021-10-20 DIAGNOSIS — I10 Essential (primary) hypertension: Secondary | ICD-10-CM

## 2021-10-20 DIAGNOSIS — Z125 Encounter for screening for malignant neoplasm of prostate: Secondary | ICD-10-CM

## 2021-10-20 DIAGNOSIS — E1169 Type 2 diabetes mellitus with other specified complication: Secondary | ICD-10-CM

## 2021-10-20 DIAGNOSIS — R972 Elevated prostate specific antigen [PSA]: Secondary | ICD-10-CM

## 2021-10-20 DIAGNOSIS — I739 Peripheral vascular disease, unspecified: Secondary | ICD-10-CM

## 2021-10-20 HISTORY — DX: Non-pressure chronic ulcer of other part of left foot with unspecified severity: L97.529

## 2021-10-20 LAB — COMPREHENSIVE METABOLIC PANEL
ALT: 11 U/L (ref 0–53)
AST: 14 U/L (ref 0–37)
Albumin: 4.3 g/dL (ref 3.5–5.2)
Alkaline Phosphatase: 108 U/L (ref 39–117)
BUN: 13 mg/dL (ref 6–23)
CO2: 28 mEq/L (ref 19–32)
Calcium: 9.1 mg/dL (ref 8.4–10.5)
Chloride: 101 mEq/L (ref 96–112)
Creatinine, Ser: 0.92 mg/dL (ref 0.40–1.50)
GFR: 86.53 mL/min (ref >60.00)
Glucose, Bld: 106 mg/dL — ABNORMAL HIGH (ref 70–99)
Potassium: 4.4 mEq/L (ref 3.5–5.1)
Sodium: 139 mEq/L (ref 135–145)
Total Bilirubin: 0.4 mg/dL (ref 0.2–1.2)
Total Protein: 7.1 g/dL (ref 6.0–8.3)

## 2021-10-20 LAB — MAGNESIUM: Magnesium: 1.3 mg/dL — ABNORMAL LOW (ref 1.5–2.5)

## 2021-10-20 LAB — CBC WITH DIFFERENTIAL/PLATELET
Basophils Absolute: 0 10*3/uL (ref 0.0–0.1)
Basophils Relative: 0.5 % (ref 0.0–3.0)
Eosinophils Absolute: 0.1 10*3/uL (ref 0.0–0.7)
Eosinophils Relative: 1.3 % (ref 0.0–5.0)
HCT: 31.8 % — ABNORMAL LOW (ref 39.0–52.0)
Hemoglobin: 10.5 g/dL — ABNORMAL LOW (ref 13.0–17.0)
Lymphocytes Relative: 19.8 % (ref 12.0–46.0)
Lymphs Abs: 1.8 10*3/uL (ref 0.7–4.0)
MCHC: 33 g/dL (ref 30.0–36.0)
MCV: 88.9 fl (ref 78.0–100.0)
Monocytes Absolute: 0.5 10*3/uL (ref 0.1–1.0)
Monocytes Relative: 5.6 % (ref 3.0–12.0)
Neutro Abs: 6.6 10*3/uL (ref 1.4–7.7)
Neutrophils Relative %: 72.8 % (ref 43.0–77.0)
Platelets: 359 10*3/uL (ref 150.0–400.0)
RBC: 3.58 Mil/uL — ABNORMAL LOW (ref 4.22–5.81)
RDW: 15.2 % (ref 11.5–15.5)
WBC: 9 10*3/uL (ref 4.0–10.5)

## 2021-10-20 LAB — TSH: TSH: 1.42 u[IU]/mL (ref 0.35–5.50)

## 2021-10-20 LAB — LIPID PANEL
Cholesterol: 103 mg/dL (ref 0–200)
HDL: 36.7 mg/dL — ABNORMAL LOW (ref >39.00)
LDL Cholesterol: 52 mg/dL (ref 0–99)
NonHDL: 66.4
Total CHOL/HDL Ratio: 3
Triglycerides: 71 mg/dL (ref 0.0–149.0)
VLDL: 14.2 mg/dL (ref 0.0–40.0)

## 2021-10-20 LAB — PSA: PSA: 10.42 ng/mL — ABNORMAL HIGH (ref 0.10–4.00)

## 2021-10-20 LAB — HEMOGLOBIN A1C: Hgb A1c MFr Bld: 8.1 % — ABNORMAL HIGH (ref 4.6–6.5)

## 2021-10-20 NOTE — Assessment & Plan Note (Signed)
Left leg was revascularized this past fall

## 2021-10-20 NOTE — Assessment & Plan Note (Signed)
Supplement and monitor 

## 2021-10-20 NOTE — Assessment & Plan Note (Addendum)
hgba1c acceptable, minimize simple carbs. Increase exercise as tolerated. Continue current meds. Has had numbers as low as 48 and several in the 60s, the highest was over 200 after eating. He notes he has sweaty sensation when sugars are low. Happens roughly 2-3 x a month. Advised to eat protein with each meal and to minimize carbs. Check a1c and then adjust meds

## 2021-10-20 NOTE — Progress Notes (Signed)
Patient ID: Gene [name], male    DOB: 01-Oct-1956  Age: 65 y.o. MRN: [id number]    Subjective:   No chief complaint on file.  Subjective  HPI Gene [name] presents for office visit today for comprehensive physical exam today and follow up on management of chronic concerns. Denies CP/palp/SOB/HA/congestion/fevers/GI or GU c/o. Taking meds as prescribed.  Left foot ulcer: Had ulcer in left foot. 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 Had to go to intensive care to get it treated and he followed up with his podiatrist after. He reports that his foot is doing a lot better and healing nicely.  Poor circulation: He reports that he has poor circulation in legs bilaterally and had to have a balloon angioplasty in left foot, but it was not needed for right foot.  Insulin/hypo: He reports that his lowest glucose was 48 and on average in the morning after eating it is usually 200. He states that it was 64 yesterday. He experiences symptoms of low glucose 2-3 times a month and reports feeling sweaty and shaky when experiencing hypo.   Review of Systems  Constitutional:  Negative for chills, fatigue and fever.  HENT:  Negative for congestion, rhinorrhea, sinus pressure, sinus pain and sore throat.   Eyes:  Negative for pain.  Respiratory:  Negative for cough and shortness of breath.   Cardiovascular:  Negative for chest pain, palpitations and leg swelling.  Gastrointestinal:  Negative for abdominal pain, blood in stool, diarrhea, nausea and vomiting.  Genitourinary:  Negative for flank pain, frequency and penile pain.  Musculoskeletal:  Negative for back pain.  Neurological:  Negative for headaches.   History Past Medical History:  Diagnosis Date   Abnormal stress test 06/01/2018   Ischemia involving LAD territory   Alcohol abuse, episodic 08/07/2008   Qualifier: Diagnosis of  By: Redmond Pulling MD, [place]     Anemia    Angina pectoris ([place])     Arthritis    CAD (coronary artery disease)    Chronic pain in right foot    COLONIC POLYPS, HX OF 08/07/2008   Qualifier: Diagnosis of  By: Lorrin Mais     Coronary artery disease 08/07/2008   Qualifier: Diagnosis of  By: Lorrin Mais     Diabetes [facility])    Diabetes mellitus type 2 in obese ([place]) 08/07/2008   Qualifier: Diagnosis of  By: Donney Rankins RMA, Lisa      Diabetes mellitus type II    Diabetic neuropathy ([place])    Dog bite of calf, right, initial encounter 12/25/2018   Elevated PSA 05/03/2014   Essential hypertension 08/07/2008   Qualifier: Diagnosis of  By: Donney Rankins RMA, Lisa     FOOT PAIN, RIGHT 09/10/2008   Qualifier: Diagnosis of  By: Redmond Pulling MD, LauraLee     GERD 08/07/2008   Qualifier: Diagnosis of  By: Lorrin Mais     GERD (gastroesophageal reflux disease)    H/O tobacco use, presenting hazards to health 05/21/2009   Qualifier: Diagnosis of  By: Wynona Luna  Last cigarette smoked May 30, 2015     History of colonic polyps    Hx of CABG 2000   Hyperlipidemia    on meds   Hyperlipidemia, mixed 08/07/2008   Qualifier: Diagnosis of  By: Donney Rankins RMA, Lisa     Hypertension    on meds   Hypomagnesemia    Insomnia 03/25/2013   Left knee  pain 11/03/2015   Myocardial infarction [facility]) 2006   Neuromuscular disorder ([place])    NEUROPATHY   Obesity 06/24/2016   Peripheral neuropathy 09/10/2008   Qualifier: Diagnosis of  By: Redmond Pulling MD, [place]     PERIPHERAL VASCULAR DISEASE 08/07/2008   Qualifier: Diagnosis of  By: Lorrin Mais     Plantar fasciitis    Preventative health care 05/03/2014   Psoriasis 05/03/2014   PVD (peripheral vascular disease) ([place])    PTA RLE 12/08 [place]   SOB (shortness of breath) on exertion 05/02/2018   Status post coronary artery bypass graft 05/11/2018   2000   TOBACCO USER 05/21/2009   Qualifier: Diagnosis of  By: Wynona Luna  Last cigarette smoked May 30, 2015      He has a past surgical  history that includes Coronary artery bypass graft (2000); Revision total knee arthroplasty (Right, 2009); LEFT HEART CATH AND CORS/GRAFTS ANGIOGRAPHY (N/A, 06/08/2018); Wisdom tooth extraction; Tonsillectomy and adenoidectomy; and Vasectomy.   His family history includes Aneurysm in his father; Cancer in his maternal uncle; Coronary artery disease in his mother; Hyperlipidemia in his father and another family member; Hypertension in his mother and another family member; Prostate cancer in his brother; Stroke in an other family member.He reports that he quit smoking about 6 years ago. His smoking use included cigarettes. He has a 20.00 pack-year smoking history. He has never used smokeless tobacco. He reports that he does not drink alcohol and does not use drugs.  Current Outpatient Medications on File Prior to Visit  Medication Sig Dispense Refill   Alpha-D-Galactosidase (ANTI-GAS PO) Take 1 tablet by mouth daily. Unknown strenght     amLODipine (NORVASC) 10 MG tablet Take 1 tablet by mouth once daily 90 tablet 1   aspirin 81 MG EC tablet Take by mouth.     atorvastatin (LIPITOR) 40 MG tablet Take 1 tablet by mouth once daily 90 tablet 0   clopidogrel (PLAVIX) 75 MG tablet Take 75 mg by mouth daily.     fish oil-omega-3 fatty acids 1000 MG capsule Take 1 g by mouth at bedtime.     gabapentin (NEURONTIN) 600 MG tablet TAKE 1 TABLET BY MOUTH TWICE DAILY. MAY TAKE ADDITIONAL TABLET AS NEEDED FOR SEVERE PAIN. DO NOT EXCEED 3 PER 24 HOURS (Patient taking differently: Take 600 mg by mouth 2 (two) times daily. TAKE 1 TABLET BY MOUTH TWICE DAILY. MAY TAKE ADDITIONAL TABLET AS NEEDED FOR SEVERE PAIN. DO NOT EXCEED 3 PER 24 HOURS) 270 tablet 1   glimepiride (AMARYL) 4 MG tablet TAKE 1 TABLET BY MOUTH IN THE MORNING AND AT BEDTIME (Patient taking differently: Take 4 mg by mouth at bedtime.) 180 tablet 1   insulin glargine (LANTUS SOLOSTAR) 100 UNIT/ML Solostar Pen Inject 25-35 Units into the skin at bedtime. 30  mL 5   labetalol (NORMODYNE) 200 MG tablet Take 1 tablet by mouth twice daily (Patient taking differently: Take 200 mg by mouth 2 (two) times daily.) 180 tablet 1   lisinopril (ZESTRIL) 20 MG tablet Take 1 tablet (20 mg total) by mouth daily. 90 tablet 3   Loperamide HCl (ANTI-DIARRHEAL PO) Take 1 tablet by mouth as needed (diarrhea).     MAGNESIUM EXTRA STRENGTH 400 MG CAPS TAKE 1 CAPSULE BY MOUTH ONCE DAILY 30 capsule 3   magnesium oxide (MAG-OX) 400 (240 Mg) MG tablet Take 1 tablet by mouth daily.     metFORMIN (GLUCOPHAGE-XR) 500 MG 24 hr tablet Take 2 tablets  by mouth twice daily (Patient taking differently: Take 1,000 mg by mouth daily with breakfast.) 360 tablet 1   Multiple Vitamins-Minerals (CENTRUM PO) Take 1 tablet by mouth daily. Unknown strenght     nitroGLYCERIN (NITROSTAT) 0.4 MG SL tablet Place 0.4 mg under the tongue every 5 (five) minutes as needed for chest pain.     omeprazole (PRILOSEC) 40 MG capsule Take 1 capsule by mouth once daily 90 capsule 1   ranolazine (RANEXA) 500 MG 12 hr tablet Take 1 tablet (500 mg total) by mouth 2 (two) times daily. 180 tablet 3   Specialty Vitamins Products (MAGNESIUM, AMINO ACID CHELATE,) 133 MG tablet Take 1 tablet by mouth daily. Unknown strenght     VITAMIN D, CHOLECALCIFEROL, PO Take 1 tablet by mouth daily. Unknown strenght     No current facility-administered medications on file prior to visit.     Objective:  Objective  Physical Exam Constitutional:      General: He is not in acute distress.    Appearance: Normal appearance. He is not ill-appearing or toxic-appearing.  HENT:     Head: Normocephalic and atraumatic.     Right Ear: Tympanic membrane, ear canal and external ear normal.     Left Ear: Tympanic membrane, ear canal and external ear normal.     Nose: No congestion or rhinorrhea.  Eyes:     Extraocular Movements: Extraocular movements intact.     Right eye: No nystagmus.     Left eye: No nystagmus.     Pupils: Pupils  are equal, round, and reactive to light.  Cardiovascular:     Rate and Rhythm: Normal rate and regular rhythm.     Pulses: Normal pulses.     Heart sounds: Normal heart sounds. No murmur heard. Pulmonary:     Effort: Pulmonary effort is normal. No respiratory distress.     Breath sounds: Normal breath sounds. No wheezing, rhonchi or rales.  Abdominal:     General: Bowel sounds are normal.     Palpations: Abdomen is soft. There is no mass.     Tenderness: There is no abdominal tenderness. There is no guarding.     Hernia: No hernia is present.  Musculoskeletal:        General: Normal range of motion.     Cervical back: Normal range of motion and neck supple.  Skin:    General: Skin is warm and dry.  Neurological:     Mental Status: He is alert and oriented to person, place, and time.  Psychiatric:        Behavior: Behavior normal.   BP (!) 142/82 (BP Location: Left Arm, Patient Position: Sitting)   Pulse 81   Temp 98.1 F (36.7 C)   Resp 16   Ht 5\' 11"  (1.803 m)   Wt 193 lb 9.6 oz (87.8 kg)   SpO2 99%   BMI 27.00 kg/m  Wt Readings from Last 3 Encounters:  10/20/21 193 lb 9.6 oz (87.8 kg)  06/22/21 200 lb (90.7 kg)  05/29/21 196 lb (88.9 kg)     Lab Results  Component Value Date   WBC 7.1 06/29/2021   HGB 11.1 (L) 06/29/2021   HCT 33.1 (L) 06/29/2021   PLT 306.0 06/29/2021   GLUCOSE 138 (H) 03/05/2021   CHOL 100 06/29/2021   TRIG 131.0 06/29/2021   HDL 29.70 (L) 06/29/2021   LDLCALC 44 06/29/2021   ALT 11 03/05/2021   AST 14 03/05/2021   NA 140 03/05/2021  K 4.5 03/05/2021   CL 101 03/05/2021   CREATININE 1.00 03/05/2021   BUN 20 03/05/2021   CO2 29 03/05/2021   TSH 1.30 06/29/2021   PSA 8.27 (H) 03/05/2021   INR 1.1 07/31/2008   HGBA1C 7.7 (H) 06/29/2021   MICROALBUR 8.9 (H) 05/02/2018    ECHOCARDIOGRAM COMPLETE  Result Date: 07/04/2020    ECHOCARDIOGRAM REPORT   Patient Name:   FULLER MAKIN Date of Exam: 07/02/2020 Medical Rec #:  [id number]         Height:       71.0 in Accession #:    [id number]       Weight:       204.8 lb Date of Birth:  06-20-1956         BSA:          2.130 m Patient Age:    39 years         BP:           125/68 mmHg Patient Gender: M                HR:           70 bpm. Exam Location:  Inpatient Procedure: 2D Echo Indications:    cardiomyopathy425.9  History:        Patient has prior history of Echocardiogram examinations, most                 recent 05/23/2018. Prior CABG; Risk Factors:Diabetes, Hypertension                 and Dyslipidemia.  Sonographer:    Johny Chess RDCS Referring Phys: [phone] [place]  1. Left ventricular ejection fraction, by estimation, is 60 to 65%. The left ventricle has normal function. The left ventricle has no regional wall motion abnormalities. There is mild left ventricular hypertrophy. Left ventricular diastolic parameters were normal.  2. Right ventricular systolic function is normal. The right ventricular size is normal. There is normal pulmonary artery systolic pressure. The estimated right ventricular systolic pressure is 94.7 mmHg.  3. The mitral valve is degenerative. Trivial mitral valve regurgitation. No evidence of mitral stenosis.  4. The aortic valve is tricuspid. Aortic valve regurgitation is not visualized. No aortic stenosis is present.  5. The inferior vena cava is normal in size with greater than 50% respiratory variability, suggesting right atrial pressure of 3 mmHg. FINDINGS  Left Ventricle: Left ventricular ejection fraction, by estimation, is 60 to 65%. The left ventricle has normal function. The left ventricle has no regional wall motion abnormalities. The left ventricular internal cavity size was normal in size. There is  mild left ventricular hypertrophy. Left ventricular diastolic parameters were normal. Right Ventricle: The right ventricular size is normal. No increase in right ventricular wall thickness. Right ventricular systolic function is normal.  There is normal pulmonary artery systolic pressure. The tricuspid regurgitant velocity is 2.31 m/s, and  with an assumed right atrial pressure of 3 mmHg, the estimated right ventricular systolic pressure is 65.4 mmHg. Left Atrium: Left atrial size was normal in size. Right Atrium: Right atrial size was normal in size. Pericardium: There is no evidence of pericardial effusion. Mitral Valve: The mitral valve is degenerative in appearance. Normal mobility of the mitral valve leaflets. Moderate mitral annular calcification. Trivial mitral valve regurgitation. No evidence of mitral valve stenosis. The mean mitral valve gradient is  1.8 mmHg with average heart rate of 65 bpm. Tricuspid Valve: The tricuspid  valve is normal in structure. Tricuspid valve regurgitation is trivial. No evidence of tricuspid stenosis. Aortic Valve: The aortic valve is tricuspid. Aortic valve regurgitation is not visualized. No aortic stenosis is present. There is mild calcification of the aortic valve. Pulmonic Valve: The pulmonic valve was normal in structure. Pulmonic valve regurgitation is trivial. No evidence of pulmonic stenosis. Aorta: Sinus of Valsalva and mid ascending aorta measure 38 mm which is likely upper limit of normal for age when indexed to BSA. The aortic root and ascending aorta are structurally normal, with no evidence of dilitation. Venous: The inferior vena cava is normal in size with greater than 50% respiratory variability, suggesting right atrial pressure of 3 mmHg. IAS/Shunts: No atrial level shunt detected by color flow Doppler.  LEFT VENTRICLE PLAX 2D LVIDd:         5.40 cm  Diastology LVIDs:         3.50 cm  LV e' lateral:   7.51 cm/s LV PW:         1.20 cm  LV E/e' lateral: 16.0 LV IVS:        1.20 cm  LV e' medial:    7.62 cm/s LVOT diam:     2.20 cm  LV E/e' medial:  15.7 LV SV:         83 LV SV Index:   39 LVOT Area:     3.80 cm  RIGHT VENTRICLE RV S prime:     12.20 cm/s TAPSE (M-mode): 1.4 cm LEFT ATRIUM              Index       RIGHT ATRIUM           Index LA diam:        4.50 cm 2.11 cm/m  RA Area:     15.80 cm LA Vol (A2C):   72.5 ml 34.04 ml/m RA Volume:   39.30 ml  18.45 ml/m LA Vol (A4C):   64.2 ml 30.14 ml/m LA Biplane Vol: 70.6 ml 33.15 ml/m  AORTIC VALVE LVOT Vmax:   90.70 cm/s LVOT Vmean:  63.900 cm/s LVOT VTI:    0.218 m  AORTA Ao Root diam: 3.80 cm Ao Asc diam:  3.80 cm MITRAL VALVE                TRICUSPID VALVE MV Area (PHT): 2.66 cm     TR Peak grad:   21.3 mmHg MV Mean grad:  1.8 mmHg     TR Vmax:        231.00 cm/s MV Decel Time: 285 msec MV E velocity: 120.00 cm/s  SHUNTS MV A velocity: 115.00 cm/s  Systemic VTI:  0.22 m MV E/A ratio:  1.04         Systemic Diam: 2.20 cm Cherlynn Kaiser MD Electronically signed by Cherlynn Kaiser MD Signature Date/Time: 07/04/2020/8:39:14 AM    Final      Assessment & Plan:  Plan    No orders of the defined types were placed in this encounter.   Problem List Items Addressed This Visit     Diabetes mellitus type 2 in obese ([place])    hgba1c acceptable, minimize simple carbs. Increase exercise as tolerated. Continue current meds. Has had numbers as low as 48 and several in the 60s, the highest was over 200 after eating. He notes he has sweaty sensation when sugars are low. Happens roughly 2-3 x a month. Advised to eat protein with each meal and to minimize  carbs. Check a1c and then adjust meds      Relevant Medications   aspirin 81 MG EC tablet   Other Relevant Orders   Hemoglobin A1c   Hyperlipidemia, mixed    Tolerating statin, encouraged heart healthy diet, avoid trans fats, minimize simple carbs and saturated fats. Increase exercise as tolerated      Relevant Medications   aspirin 81 MG EC tablet   Other Relevant Orders   CBC with Differential/Platelet   Comprehensive metabolic panel   Lipid panel   TSH   Essential hypertension   Relevant Medications   aspirin 81 MG EC tablet   Other Relevant Orders   CBC with Differential/Platelet    Comprehensive metabolic panel   Lipid panel   TSH   Anemia   Relevant Orders   CBC with Differential/Platelet   Comprehensive metabolic panel   Elevated PSA    Dr Tresa Moore with [facility] no changes.       Preventative health care    Patient encouraged to maintain heart healthy diet, regular exercise, adequate sleep. Consider daily probiotics. Take medications as prescribed. Labs ordered and reviewed.       PVD (peripheral vascular disease) ([facility])    Left leg was revascularized this past fall      Relevant Medications   aspirin 81 MG EC tablet   Hypertension    Mildly elevated today did not take his meds this am, no changes to meds. Encouraged heart healthy diet such as the DASH diet and exercise as tolerated.       Relevant Medications   aspirin 81 MG EC tablet   Hypomagnesemia    Supplement and monitor      Relevant Orders   Magnesium   Skin ulcer of left great toe ([place])    This past fall he developed an ulcer that required 3 days in ICU at [facility] in [place] and surgery. Now he is doing much better. They had to revascularize the left leg with balloon angio but not the right.       Other Visit Diagnoses     Screening for prostate cancer    -  Primary   Relevant Orders   PSA   Need for viral immunization       Relevant Orders   Flu Vaccine QUAD High Dose(Fluad) (Completed)       Follow-up: Return in about 4 months (around 02/18/2022) for f/u visit.  I, Suezanne Jacquet, acting as a scribe for Penni Homans, MD, have documented all relevent documentation on behalf of Penni Homans, MD, as directed by Penni Homans, MD while in the presence of Penni Homans, MD. DO:10/20/21.  I, Mosie Lukes, MD personally performed the services described in this documentation. All medical record entries made by the scribe were at my direction and in my presence. I have reviewed the chart and agree that the record reflects my personal performance and is accurate and complete

## 2021-10-20 NOTE — Assessment & Plan Note (Signed)
Dr Tresa Moore with [facility] no changes.

## 2021-10-20 NOTE — Patient Instructions (Signed)
Preventive Care 65 Years and Older, Male °Preventive care refers to lifestyle choices and visits with your health care provider that can promote health and wellness. Preventive care visits are also called wellness exams. °What can I expect for my preventive care visit? °Counseling °During your preventive care visit, your health care provider may ask about your: °Medical history, including: °Past medical problems. °Family medical history. °History of falls. °Current health, including: °Emotional well-being. °Home life and relationship well-being. °Sexual activity. °Memory and ability to understand (cognition). °Lifestyle, including: °Alcohol, nicotine or tobacco, and drug use. °Access to firearms. °Diet, exercise, and sleep habits. °Work and work environment. °Sunscreen use. °Safety issues such as seatbelt and bike helmet use. °Physical exam °Your health care provider will check your: °Height and weight. These may be used to calculate your BMI (body mass index). BMI is a measurement that tells if you are at a healthy weight. °Waist circumference. This measures the distance around your waistline. This measurement also tells if you are at a healthy weight and may help predict your risk of certain diseases, such as type 2 diabetes and high blood pressure. °Heart rate and blood pressure. °Body temperature. °Skin for abnormal spots. °What immunizations do I need? °Vaccines are usually given at various ages, according to a schedule. Your health care provider will recommend vaccines for you based on your age, medical history, and lifestyle or other factors, such as travel or where you work. °What tests do I need? °Screening °Your health care provider may recommend screening tests for certain conditions. This may include: °Lipid and cholesterol levels. °Diabetes screening. This is done by checking your blood sugar (glucose) after you have not eaten for a while (fasting). °Hepatitis C test. °Hepatitis B test. °HIV (human  immunodeficiency virus) test. °STI (sexually transmitted infection) testing, if you are at risk. °Lung cancer screening. °Colorectal cancer screening. °Prostate cancer screening. °Abdominal aortic aneurysm (AAA) screening. You may need this if you are a current or former smoker. °Talk with your health care provider about your test results, treatment options, and if necessary, the need for more tests. °Follow these instructions at home: °Eating and drinking ° °Eat a diet that includes fresh fruits and vegetables, whole grains, lean protein, and low-fat dairy products. Limit your intake of foods with high amounts of sugar, saturated fats, and salt. °Take vitamin and mineral supplements as recommended by your health care provider. °Do not drink alcohol if your health care provider tells you not to drink. °If you drink alcohol: °Limit how much you have to 0-2 drinks a day. °Know how much alcohol is in your drink. In the [place] one drink equals one 12 oz bottle of beer (355 mL), one 5 oz glass of wine (148 mL), or one 1½ oz glass of hard liquor (44 mL). °Lifestyle °Brush your teeth every morning and night with fluoride toothpaste. Floss one time each day. °Exercise for at least 30 minutes 5 or more days each week. °Do not use any products that contain nicotine or tobacco. These products include cigarettes, chewing tobacco, and vaping devices, such as e-cigarettes. If you need help quitting, ask your health care provider. °Do not use drugs. °If you are sexually active, practice safe sex. Use a condom or other form of protection to prevent STIs. °Take aspirin only as told by your health care provider. Make sure that you understand how much to take and what form to take. Work with your health care provider to find out whether it is safe and   beneficial for you to take aspirin daily. °Ask your health care provider if you need to take a cholesterol-lowering medicine (statin). °Find healthy ways to manage stress, such  as: °Meditation, yoga, or listening to music. °Journaling. °Talking to a trusted person. °Spending time with friends and family. °Safety °Always wear your seat belt while driving or riding in a vehicle. °Do not drive: °If you have been drinking alcohol. Do not ride with someone who has been drinking. °When you are tired or distracted. °While texting. °If you have been using any mind-altering substances or drugs. °Wear a helmet and other protective equipment during sports activities. °If you have firearms in your house, make sure you follow all gun safety procedures. °Minimize exposure to UV radiation to reduce your risk of skin cancer. °What's next? °Visit your health care provider once a year for an annual wellness visit. °Ask your health care provider how often you should have your eyes and teeth checked. °Stay up to date on all vaccines. °This information is not intended to replace advice given to you by your health care provider. Make sure you discuss any questions you have with your health care provider. °Document Revised: 04/29/2021 Document Reviewed: 04/29/2021 °Elsevier Patient Education © 2022 [employer] ° °

## 2021-10-20 NOTE — Assessment & Plan Note (Signed)
Tolerating statin, encouraged heart healthy diet, avoid trans fats, minimize simple carbs and saturated fats. Increase exercise as tolerated 

## 2021-10-20 NOTE — Assessment & Plan Note (Signed)
This past fall he developed an ulcer that required 3 days in ICU at [facility] in [place] and surgery. Now he is doing much better. They had to revascularize the left leg with balloon angio but not the right.

## 2021-10-20 NOTE — Assessment & Plan Note (Signed)
Mildly elevated today did not take his meds this am, no changes to meds. Encouraged heart healthy diet such as the DASH diet and exercise as tolerated.

## 2021-10-20 NOTE — Assessment & Plan Note (Signed)
Patient encouraged to maintain heart healthy diet, regular exercise, adequate sleep. Consider daily probiotics. Take medications as prescribed. Labs ordered and reviewed 

## 2021-10-22 ENCOUNTER — Other Ambulatory Visit (INDEPENDENT_AMBULATORY_CARE_PROVIDER_SITE_OTHER): Payer: Federal, State, Local not specified - PPO

## 2021-10-22 DIAGNOSIS — D649 Anemia, unspecified: Secondary | ICD-10-CM | POA: Diagnosis not present

## 2021-10-22 LAB — IBC + FERRITIN
Ferritin: 10 ng/mL — ABNORMAL LOW (ref 22.0–322.0)
Iron: 57 ug/dL (ref 42–165)
Saturation Ratios: 12.8 % — ABNORMAL LOW (ref 20.0–50.0)
TIBC: 445.2 ug/dL (ref 250.0–450.0)
Transferrin: 318 mg/dL (ref 212.0–360.0)

## 2021-10-22 NOTE — Progress Notes (Signed)
Faxed add on request to [facility] lab

## 2021-10-23 ENCOUNTER — Telehealth: Payer: Self-pay | Admitting: *Deleted

## 2021-10-23 NOTE — Telephone Encounter (Signed)
Opened in error

## 2021-11-16 ENCOUNTER — Other Ambulatory Visit: Payer: Self-pay | Admitting: Family Medicine

## 2021-11-16 DIAGNOSIS — R52 Pain, unspecified: Secondary | ICD-10-CM

## 2021-11-18 DIAGNOSIS — L97522 Non-pressure chronic ulcer of other part of left foot with fat layer exposed: Secondary | ICD-10-CM | POA: Diagnosis not present

## 2021-11-18 DIAGNOSIS — Z9862 Peripheral vascular angioplasty status: Secondary | ICD-10-CM | POA: Diagnosis not present

## 2021-11-18 DIAGNOSIS — E1151 Type 2 diabetes mellitus with diabetic peripheral angiopathy without gangrene: Secondary | ICD-10-CM | POA: Diagnosis not present

## 2021-11-20 ENCOUNTER — Telehealth: Payer: Self-pay | Admitting: *Deleted

## 2021-11-20 NOTE — Telephone Encounter (Signed)
As of the first of the year pt insurance does not cover Lantus Solostar.  Alternatives: Basaglar, Zeb Comfort Flextouch, Levemir, Semglee

## 2021-11-23 ENCOUNTER — Telehealth: Payer: Self-pay | Admitting: Family Medicine

## 2021-11-23 NOTE — Telephone Encounter (Signed)
Pt returned called and wanted to inform provider that he just refilled insulin and is currently okay.

## 2021-11-23 NOTE — Telephone Encounter (Signed)
Left message on machine to call back  

## 2021-11-23 NOTE — Telephone Encounter (Signed)
Pt returned called and wanted to inform provider that he just refilled insulin and is currently okay

## 2021-12-06 ENCOUNTER — Other Ambulatory Visit: Payer: Self-pay | Admitting: Family Medicine

## 2021-12-14 ENCOUNTER — Other Ambulatory Visit: Payer: Self-pay | Admitting: Family Medicine

## 2021-12-18 ENCOUNTER — Other Ambulatory Visit: Payer: Self-pay | Admitting: Family Medicine

## 2021-12-18 ENCOUNTER — Telehealth: Payer: Self-pay | Admitting: Family Medicine

## 2021-12-18 DIAGNOSIS — L97522 Non-pressure chronic ulcer of other part of left foot with fat layer exposed: Secondary | ICD-10-CM | POA: Diagnosis not present

## 2021-12-18 DIAGNOSIS — Z9862 Peripheral vascular angioplasty status: Secondary | ICD-10-CM | POA: Diagnosis not present

## 2021-12-18 DIAGNOSIS — E1151 Type 2 diabetes mellitus with diabetic peripheral angiopathy without gangrene: Secondary | ICD-10-CM | POA: Diagnosis not present

## 2021-12-18 MED ORDER — CLOPIDOGREL BISULFATE 75 MG PO TABS: 75.0000 mg | ORAL_TABLET | Freq: Every day | ORAL | 2 refills | Status: DC

## 2021-12-18 NOTE — Telephone Encounter (Signed)
Medication:  clopidogrel (PLAVIX) 75 MG tablet  Preferred Pharmacy (with phone number or street name):   [facility] [address] [place], [place] - [address]  [address] [place] [address]  Phone:  [phone]  Fax:  [phone]   Agent: Please be advised that RX refills may take up to 3 business days. We ask that you follow-up with your pharmacy.

## 2022-01-05 ENCOUNTER — Encounter: Payer: Self-pay | Admitting: Cardiology

## 2022-01-05 ENCOUNTER — Other Ambulatory Visit: Payer: Self-pay

## 2022-01-05 ENCOUNTER — Ambulatory Visit (INDEPENDENT_AMBULATORY_CARE_PROVIDER_SITE_OTHER): Payer: BC Managed Care – PPO | Admitting: Cardiology

## 2022-01-05 VITALS — BP 136/64 | HR 62 | Ht 71.0 in | Wt 193.0 lb

## 2022-01-05 DIAGNOSIS — E1169 Type 2 diabetes mellitus with other specified complication: Secondary | ICD-10-CM

## 2022-01-05 DIAGNOSIS — Z951 Presence of aortocoronary bypass graft: Secondary | ICD-10-CM

## 2022-01-05 DIAGNOSIS — I10 Essential (primary) hypertension: Secondary | ICD-10-CM | POA: Diagnosis not present

## 2022-01-05 DIAGNOSIS — I25118 Atherosclerotic heart disease of native coronary artery with other forms of angina pectoris: Secondary | ICD-10-CM | POA: Diagnosis not present

## 2022-01-05 DIAGNOSIS — E669 Obesity, unspecified: Secondary | ICD-10-CM

## 2022-01-05 DIAGNOSIS — L97529 Non-pressure chronic ulcer of other part of left foot with unspecified severity: Secondary | ICD-10-CM

## 2022-01-05 NOTE — Patient Instructions (Signed)

## 2022-01-05 NOTE — Progress Notes (Signed)
Cardiology Office Note:    Date:  01/05/2022   ID:  Gene [name], DOB [birth year], MRN [id number]  PCP:  Mosie Lukes, MD  Cardiologist:  Jenne Campus, MD    Referring MD: Mosie Lukes, MD   No chief complaint on file. I am doing great  History of Present Illness:    Gene [name] is a 66 y.o. male with past medical history significant for coronary artery disease status post coronary artery bypass graft on many years ago, last evaluation of his coronary arteries was done by cardiac catheterization 2019 at that time no target lesions were identified for intervention.  He also history of diabetes mellitus, essential hypertension, dyslipidemia, obesity.  He was find to have unhealing ulcer on his toe.  He tells me that at [facility] he had angioplasty done for his arteries in the left lower extremities.  Since that time wound is healing much better.  He denies having a cardiac complaints.  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  He describes only one episode of fatigue tiredness and shortness of breath that happened when he was carrying heavy bags of carotts.  Past Medical History:  Diagnosis Date   Abnormal stress test 06/01/2018   Ischemia involving LAD territory   Alcohol abuse, episodic 08/07/2008   Qualifier: Diagnosis of  By: Redmond Pulling MD, LauraLee     Anemia    Angina pectoris ([place])    Arthritis    CAD (coronary artery disease)    Chronic pain in right foot    COLONIC POLYPS, HX OF 08/07/2008   Qualifier: Diagnosis of  By: Lorrin Mais     Coronary artery disease 08/07/2008   Qualifier: Diagnosis of  By: Lorrin Mais     Diabetes [facility])    Diabetes mellitus type 2 in obese ([place]) 08/07/2008   Qualifier: Diagnosis of  By: Donney Rankins RMA, Lisa      Diabetes mellitus type II    Diabetic neuropathy ([place])    Dog bite of calf, right, initial encounter 12/25/2018   Elevated PSA 05/03/2014   Essential  hypertension 08/07/2008   Qualifier: Diagnosis of  By: Donney Rankins RMA, Lisa     FOOT PAIN, RIGHT 09/10/2008   Qualifier: Diagnosis of  By: Redmond Pulling MD, LauraLee     GERD 08/07/2008   Qualifier: Diagnosis of  By: Lorrin Mais     GERD (gastroesophageal reflux disease)    H/O tobacco use, presenting hazards to health 05/21/2009   Qualifier: Diagnosis of  By: Wynona Luna  Last cigarette smoked May 30, 2015     History of colonic polyps    Hx of CABG 2000   Hyperlipidemia    on meds   Hyperlipidemia, mixed 08/07/2008   Qualifier: Diagnosis of  By: Donney Rankins RMA, Lisa     Hypertension    on meds   Hypomagnesemia    Insomnia 03/25/2013   Left knee pain 11/03/2015   Myocardial infarction [facility]) 2006   Neuromuscular disorder ([place])    NEUROPATHY   Obesity 06/24/2016   Peripheral neuropathy 09/10/2008   Qualifier: Diagnosis of  By: Redmond Pulling MD, [place]     PERIPHERAL VASCULAR DISEASE 08/07/2008   Qualifier: Diagnosis of  By: Lorrin Mais     Plantar fasciitis    Preventative health care 05/03/2014   Psoriasis 05/03/2014   PVD (peripheral vascular disease) ([place])    PTA  RLE 12/08 [place]   SOB (shortness of breath) on exertion 05/02/2018   Status post coronary artery bypass graft 05/11/2018   2000   TOBACCO USER 05/21/2009   Qualifier: Diagnosis of  By: Wynona Luna  Last cigarette smoked May 30, 2015      Past Surgical History:  Procedure Laterality Date   CORONARY ARTERY BYPASS GRAFT  2000   200 SVG to diagonal, LIMA to LAD, SVG to LCX   LEFT HEART CATH AND CORS/GRAFTS ANGIOGRAPHY N/A 06/08/2018   Procedure: LEFT HEART CATH AND CORS/GRAFTS ANGIOGRAPHY;  Surgeon: Belva Crome, MD;  Location: [facility];  Service: Cardiovascular;  Laterality: N/A;   REVISION TOTAL KNEE ARTHROPLASTY Right 2009   TONSILLECTOMY AND ADENOIDECTOMY     VASECTOMY     WISDOM TOOTH EXTRACTION      Current Medications: Current Meds  Medication Sig    Alpha-D-Galactosidase (ANTI-GAS PO) Take 1 tablet by mouth daily. Unknown strenght   amLODipine (NORVASC) 10 MG tablet Take 1 tablet by mouth once daily   aspirin 81 MG EC tablet Take by mouth.   atorvastatin (LIPITOR) 40 MG tablet Take 1 tablet by mouth once daily   clopidogrel (PLAVIX) 75 MG tablet Take 1 tablet (75 mg total) by mouth daily.   fish oil-omega-3 fatty acids 1000 MG capsule Take 1 g by mouth at bedtime.   gabapentin (NEURONTIN) 600 MG tablet TAKE 1 TABLET BY MOUTH TWICE DAILY. MAY TAKE ADDITIONAL TABLET AS NEEDED FOR SEVERE PAIN. DO NOT EXCEED 3 TABLETS PER 24 HOURS.   glimepiride (AMARYL) 4 MG tablet TAKE 1 TABLET BY MOUTH IN THE MORNING AND AT BEDTIME   insulin glargine (LANTUS SOLOSTAR) 100 UNIT/ML Solostar Pen Inject 25-35 Units into the skin at bedtime.   labetalol (NORMODYNE) 200 MG tablet Take 1 tablet by mouth twice daily   lisinopril (ZESTRIL) 20 MG tablet Take 1 tablet (20 mg total) by mouth daily.   MAGNESIUM EXTRA STRENGTH 400 MG CAPS TAKE 1 CAPSULE BY MOUTH ONCE DAILY   metFORMIN (GLUCOPHAGE-XR) 500 MG 24 hr tablet Take 2 tablets by mouth twice daily   Multiple Vitamins-Minerals (CENTRUM PO) Take 1 tablet by mouth daily. Unknown strenght   nitroGLYCERIN (NITROSTAT) 0.4 MG SL tablet Place 0.4 mg under the tongue every 5 (five) minutes as needed for chest pain.   omeprazole (PRILOSEC) 40 MG capsule Take 1 capsule by mouth once daily   ranolazine (RANEXA) 500 MG 12 hr tablet Take 1 tablet (500 mg total) by mouth 2 (two) times daily.   Specialty Vitamins Products (MAGNESIUM, AMINO ACID CHELATE,) 133 MG tablet Take 1 tablet by mouth daily. Unknown strenght   VITAMIN D, CHOLECALCIFEROL, PO Take 1 tablet by mouth daily. Unknown strenght     Allergies:   Patient has no known allergies.   Social History   Socioeconomic History   Marital status: Married    Spouse name: Not on file   Number of children: Not on file   Years of education: Not on file   Highest education  level: Not on file  Occupational History   Not on file  Tobacco Use   Smoking status: Former    Packs/day: 0.50    Years: 40.00    Pack years: 20.00    Types: Cigarettes    Quit date: 05/04/2015    Years since quitting: 6.6   Smokeless tobacco: Never  Vaping Use   Vaping Use: Never used  Substance and Sexual Activity  Alcohol use: No    Alcohol/week: 0.0 standard drinks   Drug use: No    Comment: rare   Sexual activity: Yes    Comment: lives with wife, retired   Other Topics Concern   Not on file  Social History Narrative   Occupation: TEFL teacher   Second marriage - 3 children from first marriage   Tobacco Use - Yes.     Alcohol Use - yes(rare)           Social Determinants of Health   Financial Resource Strain: Not on file  Food Insecurity: Not on file  Transportation Needs: Not on file  Physical Activity: Not on file  Stress: Not on file  Social Connections: Not on file     Family History: The patient's family history includes Aneurysm in his father; Cancer in his maternal uncle; Coronary artery disease in his mother; Hyperlipidemia in his father and another family member; Hypertension in his mother and another family member; Prostate cancer in his brother; Stroke in an other family member. There is no history of Colon cancer, Esophageal cancer, Liver cancer, Pancreatic cancer, Rectal cancer, Stomach cancer, or Colon polyps. ROS:   Please see the history of present illness.    All 14 point review of systems negative except as described per history of present illness  EKGs/Labs/Other Studies Reviewed:      Recent Labs: 10/20/2021: ALT 11; BUN 13; Creatinine, Ser 0.92; Hemoglobin 10.5; Magnesium 1.3; Platelets 359.0; Potassium 4.4; Sodium 139; TSH 1.42  Recent Lipid Panel    Component Value Date/Time   CHOL 103 10/20/2021 0952   CHOL 101 02/04/2020 1652   TRIG 71.0 10/20/2021 0952   HDL 36.70 (L) 10/20/2021 0952   HDL 31 (L) 02/04/2020 1652   CHOLHDL 3  10/20/2021 0952   VLDL 14.2 10/20/2021 0952   LDLCALC 52 10/20/2021 0952   LDLCALC 48 02/04/2020 1652    Physical Exam:    VS:  BP 136/64 (BP Location: Left Arm)    Pulse 62    Ht 5\' 11"  (1.803 m)    Wt 193 lb (87.5 kg)    SpO2 98%    BMI 26.92 kg/m     Wt Readings from Last 3 Encounters:  01/05/22 193 lb (87.5 kg)  10/20/21 193 lb 9.6 oz (87.8 kg)  06/22/21 200 lb (90.7 kg)     GEN:  Well nourished, well developed in no acute distress HEENT: Normal NECK: No JVD; No carotid bruits LYMPHATICS: No lymphadenopathy CARDIAC: RRR, no murmurs, no rubs, no gallops RESPIRATORY:  Clear to auscultation without rales, wheezing or rhonchi  ABDOMEN: Soft, non-tender, non-distended MUSCULOSKELETAL:  No edema; No deformity  SKIN: Warm and dry LOWER EXTREMITIES: no swelling NEUROLOGIC:  Alert and oriented x 3 PSYCHIATRIC:  Normal affect   ASSESSMENT:    1. Coronary artery disease of native artery of native heart with stable angina pectoris ([place])   2. Essential hypertension   3. Diabetes mellitus type 2 in obese ([place])   4. Status post coronary artery bypass graft   5. Skin ulcer of left great toe, unspecified ulcer stage ([facility])    PLAN:    In order of problems listed above:  Coronary disease status post coronary artery bypass graft years ago.  Last cardiac catheterization done 06/08/2018 showing no target lesion for intervention.  Recommendation was continuation of medical therapy which we doing with success.  He does have 1 episode of suspicion for angina pectoris that happened with extreme exertion.  Essential hypertension: Blood pressure seems well controlled continue present management. Type 2 diabetes that he followed by internal medicine team.  I did review K PN which show me his hemoglobin A1c of 8.1 which is from 10/20/2021 I told him he is generally better control his diabetes. Dyslipidemia.  I did review K PN which show me LDL of 52 HDL 36 this is from 10/20/2021.  He is on high  intense statin which I will continue. Nonhealing ulcer of his toe.  After angioplasty doing dramatically better.  He is on dual antiplatelet therapy which I will continue.  Interestingly because previously he did not want to have any work-up done for his peripheral vascular disease now he did have angioplasty performed.   Medication Adjustments/Labs and Tests Ordered: Current medicines are reviewed at length with the patient today.  Concerns regarding medicines are outlined above.  No orders of the defined types were placed in this encounter.  Medication changes: No orders of the defined types were placed in this encounter.   Signed, Park Liter, MD, [facility] 01/05/2022 8:24 AM    [place]

## 2022-01-11 DIAGNOSIS — E1151 Type 2 diabetes mellitus with diabetic peripheral angiopathy without gangrene: Secondary | ICD-10-CM | POA: Diagnosis not present

## 2022-01-11 DIAGNOSIS — L97522 Non-pressure chronic ulcer of other part of left foot with fat layer exposed: Secondary | ICD-10-CM | POA: Diagnosis not present

## 2022-01-18 ENCOUNTER — Other Ambulatory Visit: Payer: Self-pay | Admitting: Cardiology

## 2022-02-01 DIAGNOSIS — E1151 Type 2 diabetes mellitus with diabetic peripheral angiopathy without gangrene: Secondary | ICD-10-CM | POA: Diagnosis not present

## 2022-02-01 DIAGNOSIS — Z8631 Personal history of diabetic foot ulcer: Secondary | ICD-10-CM | POA: Diagnosis not present

## 2022-02-01 DIAGNOSIS — E1142 Type 2 diabetes mellitus with diabetic polyneuropathy: Secondary | ICD-10-CM | POA: Diagnosis not present

## 2022-03-01 ENCOUNTER — Other Ambulatory Visit: Payer: Self-pay | Admitting: Family Medicine

## 2022-03-01 ENCOUNTER — Ambulatory Visit: Payer: Federal, State, Local not specified - PPO | Admitting: Family Medicine

## 2022-03-08 ENCOUNTER — Other Ambulatory Visit: Payer: Self-pay | Admitting: Family Medicine

## 2022-03-16 ENCOUNTER — Other Ambulatory Visit: Payer: Self-pay | Admitting: Family Medicine

## 2022-04-13 ENCOUNTER — Other Ambulatory Visit: Payer: Self-pay | Admitting: Family Medicine

## 2022-04-26 ENCOUNTER — Other Ambulatory Visit: Payer: Self-pay | Admitting: Family Medicine

## 2022-04-26 DIAGNOSIS — R52 Pain, unspecified: Secondary | ICD-10-CM

## 2022-05-01 ENCOUNTER — Other Ambulatory Visit: Payer: Self-pay | Admitting: Family Medicine

## 2022-05-03 ENCOUNTER — Encounter: Payer: Self-pay | Admitting: *Deleted

## 2022-05-05 DIAGNOSIS — L600 Ingrowing nail: Secondary | ICD-10-CM | POA: Insufficient documentation

## 2022-05-05 DIAGNOSIS — Z8631 Personal history of diabetic foot ulcer: Secondary | ICD-10-CM

## 2022-05-05 HISTORY — DX: Personal history of diabetic foot ulcer: Z86.31

## 2022-05-12 ENCOUNTER — Other Ambulatory Visit: Payer: Self-pay | Admitting: Family Medicine

## 2022-05-25 ENCOUNTER — Other Ambulatory Visit: Payer: Self-pay | Admitting: Family Medicine

## 2022-05-30 ENCOUNTER — Other Ambulatory Visit: Payer: Self-pay | Admitting: Family Medicine

## 2022-06-15 ENCOUNTER — Other Ambulatory Visit: Payer: Self-pay | Admitting: Family Medicine

## 2022-06-16 LAB — HM DIABETES EYE EXAM

## 2022-07-05 ENCOUNTER — Ambulatory Visit (INDEPENDENT_AMBULATORY_CARE_PROVIDER_SITE_OTHER): Payer: BC Managed Care – PPO | Admitting: Family Medicine

## 2022-07-05 ENCOUNTER — Other Ambulatory Visit: Payer: Self-pay | Admitting: Family Medicine

## 2022-07-05 ENCOUNTER — Other Ambulatory Visit: Payer: Self-pay | Admitting: Cardiology

## 2022-07-05 VITALS — BP 136/70 | HR 71 | Temp 98.0°F | Resp 18 | Ht 71.0 in | Wt 187.0 lb

## 2022-07-05 DIAGNOSIS — E1169 Type 2 diabetes mellitus with other specified complication: Secondary | ICD-10-CM

## 2022-07-05 DIAGNOSIS — E782 Mixed hyperlipidemia: Secondary | ICD-10-CM

## 2022-07-05 DIAGNOSIS — E669 Obesity, unspecified: Secondary | ICD-10-CM

## 2022-07-05 DIAGNOSIS — E663 Overweight: Secondary | ICD-10-CM | POA: Diagnosis not present

## 2022-07-05 DIAGNOSIS — I10 Essential (primary) hypertension: Secondary | ICD-10-CM

## 2022-07-05 DIAGNOSIS — E1142 Type 2 diabetes mellitus with diabetic polyneuropathy: Secondary | ICD-10-CM

## 2022-07-05 DIAGNOSIS — D649 Anemia, unspecified: Secondary | ICD-10-CM

## 2022-07-05 LAB — CBC
HCT: 33 % — ABNORMAL LOW (ref 39.0–52.0)
Hemoglobin: 10.9 g/dL — ABNORMAL LOW (ref 13.0–17.0)
MCHC: 33.1 g/dL (ref 30.0–36.0)
MCV: 90 fl (ref 78.0–100.0)
Platelets: 333 10*3/uL (ref 150.0–400.0)
RBC: 3.66 Mil/uL — ABNORMAL LOW (ref 4.22–5.81)
RDW: 15 % (ref 11.5–15.5)
WBC: 6.4 10*3/uL (ref 4.0–10.5)

## 2022-07-05 LAB — COMPREHENSIVE METABOLIC PANEL
ALT: 13 U/L (ref 0–53)
AST: 15 U/L (ref 0–37)
Albumin: 4 g/dL (ref 3.5–5.2)
Alkaline Phosphatase: 89 U/L (ref 39–117)
BUN: 14 mg/dL (ref 6–23)
CO2: 30 mEq/L (ref 19–32)
Calcium: 8.9 mg/dL (ref 8.4–10.5)
Chloride: 101 mEq/L (ref 96–112)
Creatinine, Ser: 0.95 mg/dL (ref 0.40–1.50)
GFR: 82.85 mL/min (ref >60.00)
Glucose, Bld: 107 mg/dL — ABNORMAL HIGH (ref 70–99)
Potassium: 4.7 mEq/L (ref 3.5–5.1)
Sodium: 140 mEq/L (ref 135–145)
Total Bilirubin: 0.4 mg/dL (ref 0.2–1.2)
Total Protein: 6.6 g/dL (ref 6.0–8.3)

## 2022-07-05 LAB — LIPID PANEL
Cholesterol: 95 mg/dL (ref 0–200)
HDL: 33.8 mg/dL — ABNORMAL LOW (ref >39.00)
LDL Cholesterol: 51 mg/dL (ref 0–99)
NonHDL: 61.45
Total CHOL/HDL Ratio: 3
Triglycerides: 54 mg/dL (ref 0.0–149.0)
VLDL: 10.8 mg/dL (ref 0.0–40.0)

## 2022-07-05 LAB — HEMOGLOBIN A1C: Hgb A1c MFr Bld: 8.8 % — ABNORMAL HIGH (ref 4.6–6.5)

## 2022-07-05 LAB — MICROALBUMIN / CREATININE URINE RATIO
Creatinine,U: 72.3 mg/dL
Microalb Creat Ratio: 115.1 mg/g — ABNORMAL HIGH (ref 0.0–30.0)
Microalb, Ur: 83.2 mg/dL — ABNORMAL HIGH (ref 0.0–1.9)

## 2022-07-05 LAB — TSH: TSH: 1.34 u[IU]/mL (ref 0.35–5.50)

## 2022-07-05 MED ORDER — GLIMEPIRIDE 4 MG PO TABS
4.0000 mg | ORAL_TABLET | Freq: Every day | ORAL | 0 refills | Status: DC
Start: 2022-07-05 — End: 2022-09-09

## 2022-07-05 MED ORDER — LANTUS SOLOSTAR 100 UNIT/ML ~~LOC~~ SOPN: 15.0000 [IU] | PEN_INJECTOR | Freq: Every day | SUBCUTANEOUS | 5 refills | Status: DC

## 2022-07-05 NOTE — Assessment & Plan Note (Signed)
Good weight loss and is trying to eat well

## 2022-07-05 NOTE — Assessment & Plan Note (Signed)
Well controlled, no changes to meds. Encouraged heart healthy diet such as the DASH diet and exercise as tolerated.  °

## 2022-07-05 NOTE — Assessment & Plan Note (Signed)
maintain leafy greens, consider increased lean red meat and using cast iron cookware. Continue to monitor, report any concerns

## 2022-07-05 NOTE — Assessment & Plan Note (Signed)
Encourage heart healthy diet such as MIND or DASH diet, increase exercise, avoid trans fats, simple carbohydrates and processed foods, consider a krill or fish or flaxseed oil cap daily. Tolerating Atorvastatin 

## 2022-07-05 NOTE — Assessment & Plan Note (Signed)
ks 12000 to 15000 steps at work daily so his feet really hurt. His neuropathy lotion is helpful. Will continue to use and continue Gabapentin

## 2022-07-05 NOTE — Patient Instructions (Addendum)
Managing Your Hypertension RSV (respiratory syncitial virus) immunization at pharmacy Hypertension, also called high blood pressure, is when the force of the blood pressing against the walls of the arteries is too stryong. Arteries are blood vessels that carry blood from your heart throughout your body. Hypertension forces the heart to work harder to pump blood and may cause the arteries to become narrow or stiff. Understanding blood pressure readings A blood pressure reading includes a higher number over a lower number: The first, or top, number is called the systolic pressure. It is a measure of the pressure in your arteries as your heart beats. The second, or bottom number, is called the diastolic pressure. It is a measure of the pressure in your arteries as the heart relaxes. For most people, a normal blood pressure is below 120/80. Your personal target blood pressure may vary depending on your medical conditions, your age, and other factors. Blood pressure is classified into four stages. Based on your blood pressure reading, your health care provider may use the following stages to determine what type of treatment you need, if any. Systolic pressure and diastolic pressure are measured in a unit called millimeters of mercury (mmHg). Normal Systolic pressure: below 836. Diastolic pressure: below 80. Elevated Systolic pressure: 629-476. Diastolic pressure: below 80. Hypertension stage 1 Systolic pressure: 546-503. Diastolic pressure: 54-65. Hypertension stage 2 Systolic pressure: 681 or above. Diastolic pressure: 90 or above. How can this condition affect me? Managing your hypertension is very important. Over time, hypertension can damage the arteries and decrease blood flow to parts of the body, including the brain, heart, and kidneys. Having untreated or uncontrolled hypertension can lead to: A heart attack. A stroke. A weakened blood vessel (aneurysm). Heart failure. Kidney  damage. Eye damage. Memory and concentration problems. Vascular dementia. What actions can I take to manage this condition? Hypertension can be managed by making lifestyle changes and possibly by taking medicines. Your health care provider will help you make a plan to bring your blood pressure within a normal range. You may be referred for counseling on a healthy diet and physical activity. Nutrition  Eat a diet that is high in fiber and potassium, and low in salt (sodium), added sugar, and fat. An example eating plan is called the DASH diet. DASH stands for Dietary Approaches to Stop Hypertension. To eat this way: Eat plenty of fresh fruits and vegetables. Try to fill one-half of your plate at each meal with fruits and vegetables. Eat whole grains, such as whole-wheat pasta, brown rice, or whole-grain bread. Fill about one-fourth of your plate with whole grains. Eat low-fat dairy products. Avoid fatty cuts of meat, processed or cured meats, and poultry with skin. Fill about one-fourth of your plate with lean proteins such as fish, chicken without skin, beans, eggs, and tofu. Avoid pre-made and processed foods. These tend to be higher in sodium, added sugar, and fat. Reduce your daily sodium intake. Many people with hypertension should eat less than 1,500 mg of sodium a day. Lifestyle  Work with your health care provider to maintain a healthy body weight or to lose weight. Ask what an ideal weight is for you. Get at least 30 minutes of exercise that causes your heart to beat faster (aerobic exercise) most days of the week. Activities may include walking, swimming, or biking. Include exercise to strengthen your muscles (resistance exercise), such as weight lifting, as part of your weekly exercise routine. Try to do these types of exercises for 30 minutes  at least 3 days a week. Do not use any products that contain nicotine or tobacco. These products include cigarettes, chewing tobacco, and vaping  devices, such as e-cigarettes. If you need help quitting, ask your health care provider. Control any long-term (chronic) conditions you have, such as high cholesterol or diabetes. Identify your sources of stress and find ways to manage stress. This may include meditation, deep breathing, or making time for fun activities. Alcohol use Do not drink alcohol if: Your health care provider tells you not to drink. You are pregnant, may be pregnant, or are planning to become pregnant. If you drink alcohol: Limit how much you have to: 0-1 drink a day for women. 0-2 drinks a day for men. Know how much alcohol is in your drink. In the [place] one drink equals one 12 oz bottle of beer (355 mL), one 5 oz glass of wine (148 mL), or one 1 oz glass of hard liquor (44 mL). Medicines Your health care provider may prescribe medicine if lifestyle changes are not enough to get your blood pressure under control and if: Your systolic blood pressure is 130 or higher. Your diastolic blood pressure is 80 or higher. Take medicines only as told by your health care provider. Follow the directions carefully. Blood pressure medicines must be taken as told by your health care provider. The medicine does not work as well when you skip doses. Skipping doses also puts you at risk for problems. Monitoring Before you monitor your blood pressure: Do not smoke, drink caffeinated beverages, or exercise within 30 minutes before taking a measurement. Use the bathroom and empty your bladder (urinate). Sit quietly for at least 5 minutes before taking measurements. Monitor your blood pressure at home as told by your health care provider. To do this: Sit with your back straight and supported. Place your feet flat on the floor. Do not cross your legs. Support your arm on a flat surface, such as a table. Make sure your upper arm is at heart level. Each time you measure, take two or three readings one minute apart and record the  results. You may also need to have your blood pressure checked regularly by your health care provider. General information Talk with your health care provider about your diet, exercise habits, and other lifestyle factors that may be contributing to hypertension. Review all the medicines you take with your health care provider because there may be side effects or interactions. Keep all follow-up visits. Your health care provider can help you create and adjust your plan for managing your high blood pressure. Where to find more information National Heart, Lung, and Blood Institute: https://wilson-eaton.com/ American Heart Association: www.heart.org Contact a health care provider if: You think you are having a reaction to medicines you have taken. You have repeated (recurrent) headaches. You feel dizzy. You have swelling in your ankles. You have trouble with your vision. Get help right away if: You develop a severe headache or confusion. You have unusual weakness or numbness, or you feel faint. You have severe pain in your chest or abdomen. You vomit repeatedly. You have trouble breathing. These symptoms may be an emergency. Get help right away. Call 911. Do not wait to see if the symptoms will go away. Do not drive yourself to the hospital. Summary Hypertension is when the force of blood pumping through your arteries is too strong. If this condition is not controlled, it may put you at risk for serious complications. Your personal target blood pressure  may vary depending on your medical conditions, your age, and other factors. For most people, a normal blood pressure is less than 120/80. Hypertension is managed by lifestyle changes, medicines, or both. Lifestyle changes to help manage hypertension include losing weight, eating a healthy, low-sodium diet, exercising more, stopping smoking, and limiting alcohol. This information is not intended to replace advice given to you by your health care  provider. Make sure you discuss any questions you have with your health care provider. Document Revised: 07/16/2021 Document Reviewed: 07/16/2021 Elsevier Patient Education  Gene [name].

## 2022-07-05 NOTE — Assessment & Plan Note (Signed)
He had one low sugar at 48. Will drop Glimeperide from bid to 4 mg qd. Maintain Lantus usually using 15 to 25 units daily and only up to 30 units if he has a piece of cake no changes in therapy. Will check hgba1c today

## 2022-07-05 NOTE — Progress Notes (Signed)
Subjective:    Patient ID: Gene [name], male    DOB: Nov 08, 1956, 65 y.o.   MRN: [id number]  Chief Complaint  Patient presents with   Follow-up    HPI Patient is in today for follow-up on chronic medical concerns.  No recent febrile illness or hospitalizations.  He continues to work long hours on his feet.  Says he walks between 12,000 and 15,000 steps daily.  His feet do hurt when he gets home but he gets relief from his neuropathic lotions.  He reports he is doing well and generally his sugars have been improving as he has lost weight.  He does acknowledge he had 1 sugar at 47 he felt bad but with sugar intake he recovered quickly.  He has dropped his insulin down to 15 to 20 units most days.  He only occasionally has to go up to 30 when he eats something he knows he should not.  No complaints of polyuria or polydipsia. Denies CP/palp/SOB/HA/congestion/fevers/GI or GU c/o. Taking meds as prescribed   Past Medical History:  Diagnosis Date   Abnormal stress test 06/01/2018   Ischemia involving LAD territory   Alcohol abuse, episodic 08/07/2008   Qualifier: Diagnosis of  By: Redmond Pulling MD, [place]     Anemia    Angina pectoris ([place])    Arthritis    CAD (coronary artery disease)    Chronic pain in right foot    COLONIC POLYPS, HX OF 08/07/2008   Qualifier: Diagnosis of  By: Lorrin Mais     Coronary artery disease 08/07/2008   Qualifier: Diagnosis of  By: Lorrin Mais     Diabetes [facility])    Diabetes mellitus type 2 in obese ([place]) 08/07/2008   Qualifier: Diagnosis of  By: Donney Rankins RMA, Lisa      Diabetes mellitus type II    Diabetic neuropathy ([place])    Dog bite of calf, right, initial encounter 12/25/2018   Elevated PSA 05/03/2014   Essential hypertension 08/07/2008   Qualifier: Diagnosis of  By: Donney Rankins RMA, Lisa     FOOT PAIN, RIGHT 09/10/2008   Qualifier: Diagnosis of  By: Redmond Pulling MD, LauraLee     GERD 08/07/2008   Qualifier: Diagnosis of  By: Lorrin Mais      GERD (gastroesophageal reflux disease)    H/O tobacco use, presenting hazards to health 05/21/2009   Qualifier: Diagnosis of  By: Wynona Luna  Last cigarette smoked May 30, 2015     History of colonic polyps    Hx of CABG 2000   Hyperlipidemia    on meds   Hyperlipidemia, mixed 08/07/2008   Qualifier: Diagnosis of  By: Donney Rankins RMA, Lisa     Hypertension    on meds   Hypomagnesemia    Insomnia 03/25/2013   Left knee pain 11/03/2015   Myocardial infarction [facility]) 2006   Neuromuscular disorder ([place])    NEUROPATHY   Obesity 06/24/2016   Peripheral neuropathy 09/10/2008   Qualifier: Diagnosis of  By: Redmond Pulling MD, [place]     PERIPHERAL VASCULAR DISEASE 08/07/2008   Qualifier: Diagnosis of  By: Lorrin Mais     Plantar fasciitis    Preventative health care 05/03/2014   Psoriasis 05/03/2014   PVD (peripheral vascular disease) ([place])    PTA RLE 12/08 [place]   SOB (shortness of breath) on exertion 05/02/2018   Status post coronary artery bypass graft 05/11/2018   2000   TOBACCO USER 05/21/2009   Qualifier:  Diagnosis of  By: Wynona Luna  Last cigarette smoked May 30, 2015      Past Surgical History:  Procedure Laterality Date   CORONARY ARTERY BYPASS GRAFT  2000   200 SVG to diagonal, LIMA to LAD, SVG to LCX   LEFT HEART CATH AND CORS/GRAFTS ANGIOGRAPHY N/A 06/08/2018   Procedure: LEFT HEART CATH AND CORS/GRAFTS ANGIOGRAPHY;  Surgeon: Belva Crome, MD;  Location: [facility];  Service: Cardiovascular;  Laterality: N/A;   REVISION TOTAL KNEE ARTHROPLASTY Right 2009   TONSILLECTOMY AND ADENOIDECTOMY     VASECTOMY     WISDOM TOOTH EXTRACTION      Family History  Problem Relation Age of Onset   Coronary artery disease Mother    Hypertension Mother    Hyperlipidemia Father    Aneurysm Father        brain   Prostate cancer Brother    Cancer Maternal Uncle    Hyperlipidemia Other    Hypertension Other    Stroke Other    Colon cancer Neg Hx     Esophageal cancer Neg Hx    Liver cancer Neg Hx    Pancreatic cancer Neg Hx    Rectal cancer Neg Hx    Stomach cancer Neg Hx    Colon polyps Neg Hx     Social History   Socioeconomic History   Marital status: Married    Spouse name: Not on file   Number of children: Not on file   Years of education: Not on file   Highest education level: Not on file  Occupational History   Not on file  Tobacco Use   Smoking status: Former    Packs/day: 0.50    Years: 40.00    Total pack years: 20.00    Types: Cigarettes    Quit date: 05/04/2015    Years since quitting: 7.1   Smokeless tobacco: Never  Vaping Use   Vaping Use: Never used  Substance and Sexual Activity   Alcohol use: No    Alcohol/week: 0.0 standard drinks of alcohol   Drug use: No    Comment: rare   Sexual activity: Yes    Comment: lives with wife, retired   Other Topics Concern   Not on file  Social History Narrative   Occupation: TEFL teacher   Second marriage - 3 children from first marriage   Tobacco Use - Yes.     Alcohol Use - yes(rare)           Social Determinants of Health   Financial Resource Strain: Not on file  Food Insecurity: Not on file  Transportation Needs: Not on file  Physical Activity: Not on file  Stress: Not on file  Social Connections: Not on file  Intimate Partner Violence: Not on file    Outpatient Medications Prior to Visit  Medication Sig Dispense Refill   Alpha-D-Galactosidase (ANTI-GAS PO) Take 1 tablet by mouth daily. Unknown strenght     amLODipine (NORVASC) 10 MG tablet Take 1 tablet by mouth once daily 90 tablet 0   aspirin 81 MG EC tablet Take by mouth.     atorvastatin (LIPITOR) 40 MG tablet Take 1 tablet by mouth once daily 90 tablet 2   clopidogrel (PLAVIX) 75 MG tablet Take 1 tablet by mouth once daily 30 tablet 0   fish oil-omega-3 fatty acids 1000 MG capsule Take 1 g by mouth at bedtime.     gabapentin (NEURONTIN) 600 MG tablet TAKE 1  TABLET BY MOUTH TWICE DAILY,  MAY TAKE ADDITONAL TABLET IF NEEDED FOR SEVERE PAIN. DO NOT EXCEED 3 PER 24 HOURS 270 tablet 0   labetalol (NORMODYNE) 200 MG tablet Take 1 tablet by mouth twice daily 180 tablet 0   lisinopril (ZESTRIL) 20 MG tablet Take 1 tablet (20 mg total) by mouth daily. 90 tablet 3   MAGNESIUM EXTRA STRENGTH 400 MG CAPS TAKE 1 CAPSULE BY MOUTH ONCE DAILY 30 capsule 3   metFORMIN (GLUCOPHAGE-XR) 500 MG 24 hr tablet Take 2 tablets by mouth twice daily 360 tablet 0   Multiple Vitamins-Minerals (CENTRUM PO) Take 1 tablet by mouth daily. Unknown strenght     nitroGLYCERIN (NITROSTAT) 0.4 MG SL tablet Place 0.4 mg under the tongue every 5 (five) minutes as needed for chest pain.     omeprazole (PRILOSEC) 40 MG capsule Take 1 capsule by mouth once daily 90 capsule 0   ranolazine (RANEXA) 500 MG 12 hr tablet Take 1 tablet (500 mg total) by mouth 2 (two) times daily. 180 tablet 3   Specialty Vitamins Products (MAGNESIUM, AMINO ACID CHELATE,) 133 MG tablet Take 1 tablet by mouth daily. Unknown strenght     VITAMIN D, CHOLECALCIFEROL, PO Take 1 tablet by mouth daily. Unknown strenght     glimepiride (AMARYL) 4 MG tablet TAKE 1 TABLET BY MOUTH IN THE MORNING AND AT BEDTIME 180 tablet 0   insulin glargine (LANTUS SOLOSTAR) 100 UNIT/ML Solostar Pen Inject 25-35 Units into the skin at bedtime. 30 mL 5   No facility-administered medications prior to visit.    No Known Allergies  Review of Systems  Constitutional:  Negative for fever.  HENT:  Negative for congestion.   Eyes:  Negative for blurred vision.  Respiratory:  Negative for cough.   Cardiovascular:  Negative for chest pain and palpitations.  Gastrointestinal:  Negative for vomiting.  Musculoskeletal:  Positive for joint pain. Negative for back pain.  Skin:  Negative for rash.  Neurological:  Negative for loss of consciousness and headaches.       Objective:    Physical Exam Constitutional:      General: He is not in acute distress.    Appearance:  Normal appearance. He is not toxic-appearing.  HENT:     Head: Normocephalic and atraumatic.     Right Ear: External ear normal.     Left Ear: External ear normal.     Nose: Nose normal. No congestion.     Mouth/Throat:     Mouth: Mucous membranes are moist.  Eyes:     General:        Right eye: No discharge.        Left eye: No discharge.     Extraocular Movements: Extraocular movements intact.     Pupils: Pupils are equal, round, and reactive to light.  Cardiovascular:     Rate and Rhythm: Normal rate.     Heart sounds: No murmur heard. Pulmonary:     Effort: Pulmonary effort is normal.  Abdominal:     Palpations: Abdomen is soft. There is no mass.  Musculoskeletal:        General: No swelling.  Skin:    Findings: No rash.  Neurological:     Mental Status: He is alert and oriented to person, place, and time.  Psychiatric:        Behavior: Behavior normal.     BP 136/70   Pulse 71   Temp 98 F (36.7 C)   Resp 18  Ht '5\' 11"'$  (1.803 m)   Wt 187 lb (84.8 kg)   SpO2 98%   BMI 26.08 kg/m  Wt Readings from Last 3 Encounters:  07/05/22 187 lb (84.8 kg)  01/05/22 193 lb (87.5 kg)  10/20/21 193 lb 9.6 oz (87.8 kg)    Diabetic Foot Exam - Simple   No data filed    Lab Results  Component Value Date   WBC 9.0 10/20/2021   HGB 10.5 (L) 10/20/2021   HCT 31.8 (L) 10/20/2021   PLT 359.0 10/20/2021   GLUCOSE 106 (H) 10/20/2021   CHOL 103 10/20/2021   TRIG 71.0 10/20/2021   HDL 36.70 (L) 10/20/2021   LDLCALC 52 10/20/2021   ALT 11 10/20/2021   AST 14 10/20/2021   NA 139 10/20/2021   K 4.4 10/20/2021   CL 101 10/20/2021   CREATININE 0.92 10/20/2021   BUN 13 10/20/2021   CO2 28 10/20/2021   TSH 1.42 10/20/2021   PSA 10.42 (H) 10/20/2021   INR 1.1 07/31/2008   HGBA1C 8.1 (H) 10/20/2021   MICROALBUR 8.9 (H) 05/02/2018    Lab Results  Component Value Date   TSH 1.42 10/20/2021   Lab Results  Component Value Date   WBC 9.0 10/20/2021   HGB 10.5 (L)  10/20/2021   HCT 31.8 (L) 10/20/2021   MCV 88.9 10/20/2021   PLT 359.0 10/20/2021   Lab Results  Component Value Date   NA 139 10/20/2021   K 4.4 10/20/2021   CO2 28 10/20/2021   GLUCOSE 106 (H) 10/20/2021   BUN 13 10/20/2021   CREATININE 0.92 10/20/2021   BILITOT 0.4 10/20/2021   ALKPHOS 108 10/20/2021   AST 14 10/20/2021   ALT 11 10/20/2021   PROT 7.1 10/20/2021   ALBUMIN 4.3 10/20/2021   CALCIUM 9.1 10/20/2021   ANIONGAP 14 06/08/2018   GFR 86.53 10/20/2021   Lab Results  Component Value Date   CHOL 103 10/20/2021   Lab Results  Component Value Date   HDL 36.70 (L) 10/20/2021   Lab Results  Component Value Date   LDLCALC 52 10/20/2021   Lab Results  Component Value Date   TRIG 71.0 10/20/2021   Lab Results  Component Value Date   CHOLHDL 3 10/20/2021   Lab Results  Component Value Date   HGBA1C 8.1 (H) 10/20/2021       Assessment & Plan:   Problem List Items Addressed This Visit     Diabetes mellitus type 2 in obese ([place]) - Primary    He had one low sugar at 47. Will drop Glimeperide from bid to 4 mg qd. Maintain Lantus usually using 15 to 25 units daily and only up to 30 units if he has a piece of cake no changes in therapy. Will check hgba1c today      Relevant Medications   insulin glargine (LANTUS SOLOSTAR) 100 UNIT/ML Solostar Pen   glimepiride (AMARYL) 4 MG tablet   Other Relevant Orders   Hemoglobin A1c   Microalbumin / creatinine urine ratio   Hyperlipidemia, mixed    Encourage heart healthy diet such as MIND or DASH diet, increase exercise, avoid trans fats, simple carbohydrates and processed foods, consider a krill or fish or flaxseed oil cap daily. Tolerating Atorvastatin      Relevant Orders   Lipid panel   Essential hypertension   Relevant Orders   TSH   Anemia    maintain leafy greens, consider increased lean red meat and using cast iron cookware.  Continue to monitor, report any concerns      Overweight (BMI 25.0-29.9)     Good weight loss and is trying to eat well      Hypertension    Well controlled, no changes to meds. Encouraged heart healthy diet such as the DASH diet and exercise as tolerated.       Relevant Orders   CBC   Comprehensive metabolic panel   Diabetic neuropathy (HCC)    ks 12000 to 15000 steps at work daily so his feet really hurt. His neuropathy lotion is helpful. Will continue to use and continue Gabapentin      Relevant Medications   insulin glargine (LANTUS SOLOSTAR) 100 UNIT/ML Solostar Pen   glimepiride (AMARYL) 4 MG tablet    I have changed Gene [name] "Jeff"'s Lantus SoloStar and glimepiride. I am also having him maintain his Multiple Vitamins-Minerals (CENTRUM PO), nitroGLYCERIN, fish oil-omega-3 fatty acids, magnesium (amino acid chelate), (VITAMIN D, CHOLECALCIFEROL, PO), Alpha-D-Galactosidase (ANTI-GAS PO), Magnesium Extra Strength, lisinopril, ranolazine, aspirin EC, atorvastatin, omeprazole, gabapentin, labetalol, amLODipine, metFORMIN, and clopidogrel.  Meds ordered this encounter  Medications   insulin glargine (LANTUS SOLOSTAR) 100 UNIT/ML Solostar Pen    Sig: Inject 15-30 Units into the skin at bedtime.    Dispense:  30 mL    Refill:  5   glimepiride (AMARYL) 4 MG tablet    Sig: Take 1 tablet (4 mg total) by mouth daily with breakfast.    Dispense:  180 tablet    Refill:  0     Penni Homans, MD

## 2022-07-07 ENCOUNTER — Telehealth: Payer: Self-pay | Admitting: *Deleted

## 2022-07-07 NOTE — Telephone Encounter (Signed)
Sent mychart message regarding below instructions:   Labs stable no new concerns, no changes but sugar is up a little, minimize simple carbs and increase Lantus by two units when you drop the Glimeperide dose unless you are having sugars below 60. The protein in his urine has increased over time. Confirm he is taking his Lisinopril which helps to protect the kidneys from sugar.  Written by Mosie Lukes, MD on 07/05/2022  5:42 PM EDT Seen by patient Claretha Cooper on 07/05/2022  8:29 PM

## 2022-07-08 ENCOUNTER — Other Ambulatory Visit: Payer: Self-pay | Admitting: Family Medicine

## 2022-07-12 ENCOUNTER — Other Ambulatory Visit: Payer: Self-pay | Admitting: Family Medicine

## 2022-07-12 ENCOUNTER — Other Ambulatory Visit: Payer: Self-pay | Admitting: Cardiology

## 2022-07-12 DIAGNOSIS — R52 Pain, unspecified: Secondary | ICD-10-CM

## 2022-07-15 ENCOUNTER — Other Ambulatory Visit: Payer: Self-pay

## 2022-07-19 ENCOUNTER — Other Ambulatory Visit: Payer: Self-pay | Admitting: Family Medicine

## 2022-07-19 DIAGNOSIS — R52 Pain, unspecified: Secondary | ICD-10-CM

## 2022-07-20 ENCOUNTER — Ambulatory Visit: Payer: BC Managed Care – PPO | Attending: Cardiology | Admitting: Cardiology

## 2022-07-20 ENCOUNTER — Telehealth: Payer: Self-pay | Admitting: Family Medicine

## 2022-07-20 ENCOUNTER — Encounter: Payer: Self-pay | Admitting: Cardiology

## 2022-07-20 ENCOUNTER — Other Ambulatory Visit: Payer: Self-pay

## 2022-07-20 VITALS — BP 118/62 | HR 61 | Ht 72.0 in | Wt 188.0 lb

## 2022-07-20 DIAGNOSIS — Z951 Presence of aortocoronary bypass graft: Secondary | ICD-10-CM | POA: Diagnosis not present

## 2022-07-20 DIAGNOSIS — E669 Obesity, unspecified: Secondary | ICD-10-CM

## 2022-07-20 DIAGNOSIS — I10 Essential (primary) hypertension: Secondary | ICD-10-CM

## 2022-07-20 DIAGNOSIS — R52 Pain, unspecified: Secondary | ICD-10-CM

## 2022-07-20 DIAGNOSIS — E1151 Type 2 diabetes mellitus with diabetic peripheral angiopathy without gangrene: Secondary | ICD-10-CM | POA: Diagnosis not present

## 2022-07-20 DIAGNOSIS — R0609 Other forms of dyspnea: Secondary | ICD-10-CM

## 2022-07-20 DIAGNOSIS — E1169 Type 2 diabetes mellitus with other specified complication: Secondary | ICD-10-CM | POA: Diagnosis not present

## 2022-07-20 DIAGNOSIS — Z8631 Personal history of diabetic foot ulcer: Secondary | ICD-10-CM

## 2022-07-20 MED ORDER — GABAPENTIN 600 MG PO TABS: 600.0000 mg | ORAL_TABLET | Freq: Two times a day (BID) | ORAL | 0 refills | Status: DC

## 2022-07-20 NOTE — Telephone Encounter (Signed)
Pt is trying to get a refill on Gabapentin and per pharmacy "Requested drug refills are authorized, however, the patient needs further evaluation and/or laboratory testing before further refills are given. Ask him to make an appointment for this."  Pt was just seen at the end of the month by Dr. Charlett Blake and pt wants to know why refill is being refused for this med when he has been on it for a few years now.  Pharmacy is [facility].

## 2022-07-20 NOTE — Patient Instructions (Signed)

## 2022-07-20 NOTE — Telephone Encounter (Signed)
Refill was sent

## 2022-07-20 NOTE — Progress Notes (Signed)
Cardiology Office Note:    Date:  07/20/2022   ID:  Gene [name], DOB May 13, 1956, MRN [id number]  PCP:  Mosie Lukes, MD  Cardiologist:  Jenne Campus, MD    Referring MD: Mosie Lukes, MD   Chief Complaint  Patient presents with   Follow-up  Doing fine  History of Present Illness:    Gene [name] is a 66 y.o. male with past medical history significant for coronary artery disease, status post coronary bypass grafting many years ago last evaluation of his coronary arteries was done in form of cardiac catheterization 2019, no target lesion has been identified for intervention.  Additional problems include diabetes mellitus which is poorly controlled, essential hypertension, dyslipidemia. He is in my office today for follow-up overall he said cardiac wise doing well.  He denies have any chest pain, tightness, pressure, burning in the chest.  He still works as a physical work he injured his right shoulder that bothers him a lot right now.  Past Medical History:  Diagnosis Date   Abnormal stress test 06/01/2018   Ischemia involving LAD territory   Alcohol abuse, episodic 08/07/2008   Qualifier: Diagnosis of  By: Redmond Pulling MD, LauraLee     Anemia    Angina pectoris ([place])    Arthritis    CAD (coronary artery disease)    Chronic pain in right foot    COLONIC POLYPS, HX OF 08/07/2008   Qualifier: Diagnosis of  By: Lorrin Mais     Coronary artery disease 08/07/2008   Qualifier: Diagnosis of  By: Lorrin Mais     Diabetes [facility])    Diabetes mellitus type 2 in obese ([place]) 08/07/2008   Qualifier: Diagnosis of  By: Donney Rankins RMA, Lisa      Diabetes mellitus type II    Diabetic neuropathy ([place])    Diarrhea 03/05/2021   Dog bite of calf, right, initial encounter 12/25/2018   Elevated PSA 05/03/2014   Essential hypertension 08/07/2008   Qualifier: Diagnosis of  By: Donney Rankins RMA, [place], RIGHT 09/10/2008   Qualifier: Diagnosis of  By: Redmond Pulling MD, LauraLee      Gangrene of toe of left foot ([place]) 09/03/2021   GERD 08/07/2008   Qualifier: Diagnosis of  By: Lorrin Mais     GERD (gastroesophageal reflux disease)    H/O tobacco use, presenting hazards to health 05/21/2009   Qualifier: Diagnosis of  By: Wynona Luna  Last cigarette smoked May 30, 2015     History of colonic polyps    History of diabetic ulcer of foot 05/05/2022   History of revascularization procedure of lower extremity 09/14/2021   Hx of CABG 2000   Hyperlipidemia    on meds   Hyperlipidemia, mixed 08/07/2008   Qualifier: Diagnosis of  By: Donney Rankins RMA, Lisa     Hypertension    on meds   Hypomagnesemia    Insomnia 03/25/2013   Left knee pain 11/03/2015   Myocardial infarction [facility]) 2006   Neuromuscular disorder ([place])    NEUROPATHY   Obesity 06/24/2016   Peripheral neuropathy 09/10/2008   Qualifier: Diagnosis of  By: Redmond Pulling MD, LauraLee     PERIPHERAL VASCULAR DISEASE 08/07/2008   Qualifier: Diagnosis of  By: Lorrin Mais     Plantar fasciitis    Preventative health care 05/03/2014   Psoriasis 05/03/2014   PVD (peripheral vascular disease) ([place])    PTA RLE 12/08 [place]   Skin  ulcer of left great toe ([place]) 10/20/2021   SOB (shortness of breath) on exertion 05/02/2018   Status post coronary artery bypass graft 05/11/2018   2000   TOBACCO USER 05/21/2009   Qualifier: Diagnosis of  By: Wynona Luna  Last cigarette smoked May 30, 2015      Past Surgical History:  Procedure Laterality Date   CORONARY ARTERY BYPASS GRAFT  2000   200 SVG to diagonal, LIMA to LAD, SVG to LCX   LEFT HEART CATH AND CORS/GRAFTS ANGIOGRAPHY N/A 06/08/2018   Procedure: LEFT HEART CATH AND CORS/GRAFTS ANGIOGRAPHY;  Surgeon: Belva Crome, MD;  Location: [facility];  Service: Cardiovascular;  Laterality: N/A;   REVISION TOTAL KNEE ARTHROPLASTY Right 2009   TONSILLECTOMY AND ADENOIDECTOMY     VASECTOMY     WISDOM TOOTH EXTRACTION      Current  Medications: Current Meds  Medication Sig   Alpha-D-Galactosidase (ANTI-GAS PO) Take 1 tablet by mouth daily. Unknown strenght   amLODipine (NORVASC) 10 MG tablet Take 1 tablet by mouth once daily (Patient taking differently: Take 10 mg by mouth daily.)   aspirin 81 MG EC tablet Take 81 mg by mouth daily.   atorvastatin (LIPITOR) 40 MG tablet Take 1 tablet by mouth once daily (Patient taking differently: Take 40 mg by mouth daily. TAKE 1 TABLET BY MOUTH ONCE DAILY .)   clopidogrel (PLAVIX) 75 MG tablet Take 1 tablet by mouth once daily (Patient taking differently: Take 75 mg by mouth daily.)   fish oil-omega-3 fatty acids 1000 MG capsule Take 1 g by mouth at bedtime.   gabapentin (NEURONTIN) 600 MG tablet Take 1 tablet (600 mg total) by mouth 2 (two) times daily. May take 1 additional tablet as needed for severe pain. Do not exceed 3 tablets daily.   glimepiride (AMARYL) 4 MG tablet Take 1 tablet (4 mg total) by mouth daily with breakfast.   insulin glargine (LANTUS SOLOSTAR) 100 UNIT/ML Solostar Pen Inject 15-30 Units into the skin at bedtime.   labetalol (NORMODYNE) 200 MG tablet Take 1 tablet by mouth twice daily (Patient taking differently: Take 200 mg by mouth 2 (two) times daily.)   lisinopril (ZESTRIL) 20 MG tablet Take 1 tablet by mouth once daily   MAGNESIUM EXTRA STRENGTH 400 MG CAPS TAKE 1 CAPSULE BY MOUTH ONCE DAILY (Patient taking differently: Take 1 capsule by mouth daily.)   metFORMIN (GLUCOPHAGE-XR) 500 MG 24 hr tablet Take 2 tablets by mouth twice daily (Patient taking differently: Take 1,000 mg by mouth daily with breakfast.)   Multiple Vitamins-Minerals (CENTRUM PO) Take 1 tablet by mouth daily. Unknown strenght   nitroGLYCERIN (NITROSTAT) 0.4 MG SL tablet Place 0.4 mg under the tongue every 5 (five) minutes as needed for chest pain.   omeprazole (PRILOSEC) 40 MG capsule Take 1 capsule (40 mg total) by mouth daily.   ranolazine (RANEXA) 500 MG 12 hr tablet Take 1 tablet by mouth  twice daily   Specialty Vitamins Products (MAGNESIUM, AMINO ACID CHELATE,) 133 MG tablet Take 1 tablet by mouth daily. Unknown strenght   VITAMIN D, CHOLECALCIFEROL, PO Take 1 tablet by mouth daily. Unknown strenght     Allergies:   Patient has no known allergies.   Social History   Socioeconomic History   Marital status: Married    Spouse name: Not on file   Number of children: Not on file   Years of education: Not on file   Highest education level: Not on file  Occupational History   Not on file  Tobacco Use   Smoking status: Former    Packs/day: 0.50    Years: 40.00    Total pack years: 20.00    Types: Cigarettes    Quit date: 05/04/2015    Years since quitting: 7.2   Smokeless tobacco: Never  Vaping Use   Vaping Use: Never used  Substance and Sexual Activity   Alcohol use: No    Alcohol/week: 0.0 standard drinks of alcohol   Drug use: No    Comment: rare   Sexual activity: Yes    Comment: lives with wife, retired   Other Topics Concern   Not on file  Social History Narrative   Occupation: TEFL teacher   Second marriage - 3 children from first marriage   Tobacco Use - Yes.     Alcohol Use - yes(rare)           Social Determinants of Health   Financial Resource Strain: Not on file  Food Insecurity: Not on file  Transportation Needs: Not on file  Physical Activity: Not on file  Stress: Not on file  Social Connections: Not on file     Family History: The patient's family history includes Aneurysm in his father; Cancer in his maternal uncle; Coronary artery disease in his mother; Hyperlipidemia in his father and another family member; Hypertension in his mother and another family member; Prostate cancer in his brother; Stroke in an other family member. There is no history of Colon cancer, Esophageal cancer, Liver cancer, Pancreatic cancer, Rectal cancer, Stomach cancer, or Colon polyps. ROS:   Please see the history of present illness.    All 14 point review  of systems negative except as described per history of present illness  EKGs/Labs/Other Studies Reviewed:      Recent Labs: 10/20/2021: Magnesium 1.3 07/05/2022: ALT 13; BUN 14; Creatinine, Ser 0.95; Hemoglobin 10.9; Platelets 333.0; Potassium 4.7; Sodium 140; TSH 1.34  Recent Lipid Panel    Component Value Date/Time   CHOL 95 07/05/2022 1205   CHOL 101 02/04/2020 1652   TRIG 54.0 07/05/2022 1205   HDL 33.80 (L) 07/05/2022 1205   HDL 31 (L) 02/04/2020 1652   CHOLHDL 3 07/05/2022 1205   VLDL 10.8 07/05/2022 1205   LDLCALC 51 07/05/2022 1205   LDLCALC 48 02/04/2020 1652    Physical Exam:    VS:  BP 118/62 (BP Location: Left Arm, Patient Position: Sitting)   Pulse 61   Ht 6' (1.829 m)   Wt 188 lb (85.3 kg)   SpO2 97%   BMI 25.50 kg/m     Wt Readings from Last 3 Encounters:  07/20/22 188 lb (85.3 kg)  07/05/22 187 lb (84.8 kg)  01/05/22 193 lb (87.5 kg)     GEN:  Well nourished, well developed in no acute distress HEENT: Normal NECK: No JVD; No carotid bruits LYMPHATICS: No lymphadenopathy CARDIAC: RRR, no murmurs, no rubs, no gallops RESPIRATORY:  Clear to auscultation without rales, wheezing or rhonchi  ABDOMEN: Soft, non-tender, non-distended MUSCULOSKELETAL:  No edema; No deformity  SKIN: Warm and dry LOWER EXTREMITIES: no swelling NEUROLOGIC:  Alert and oriented x 3 PSYCHIATRIC:  Normal affect   ASSESSMENT:    1. Status post coronary artery bypass graft   2. Diabetes mellitus type 2 in obese ([place])   3. Peripheral angiopathy due to diabetes mellitus ([place])   4. Primary hypertension   5. History of diabetic ulcer of foot    PLAN:  In order of problems listed above:  Status post coronary bypass graft, coronary disease, stable, on appropriate medication which include antiplatelets therapy which I continue Dyslipidemia I did review K PN which show me LDL 51 HDL 33.8 good cholesterol profile we will continue present management which include high intense  statin for of Lipitor 40. Essential hypertension: Appropriate medications well-controlled blood pressure. Diabetes he is hemoglobin A1c from K PN from 07/05/2022 is 8.8.  It is clearly too high.  He understand that and he is trying to work on improving hemoglobin A1c and diabetes control. Dyspnea on exertion.  I will ask him to have echocardiogram done to recheck left ventricle ejection fraction   Medication Adjustments/Labs and Tests Ordered: Current medicines are reviewed at length with the patient today.  Concerns regarding medicines are outlined above.  No orders of the defined types were placed in this encounter.  Medication changes: No orders of the defined types were placed in this encounter.   Signed, Park Liter, MD, [facility] 07/20/2022 8:19 AM    [place]

## 2022-07-20 NOTE — Addendum Note (Signed)
Addended by: Jacobo Forest D on: 07/20/2022 08:31 AM   Modules accepted: Orders

## 2022-07-29 ENCOUNTER — Other Ambulatory Visit: Payer: Self-pay | Admitting: Family Medicine

## 2022-08-17 ENCOUNTER — Ambulatory Visit (HOSPITAL_BASED_OUTPATIENT_CLINIC_OR_DEPARTMENT_OTHER)
Admission: RE | Admit: 2022-08-17 | Discharge: 2022-08-17 | Disposition: A | Discharge status: Home / Self Care | Payer: Federal, State, Local not specified - PPO | Source: Ambulatory Visit | Attending: Cardiology | Admitting: Cardiology

## 2022-08-17 DIAGNOSIS — R0609 Other forms of dyspnea: Secondary | ICD-10-CM | POA: Diagnosis present

## 2022-08-17 NOTE — Progress Notes (Signed)
  Echocardiogram 2D Echocardiogram has been performed.  Merrie Roof F 08/17/2022, 10:42 AM

## 2022-08-18 LAB — ECHOCARDIOGRAM COMPLETE
AR max vel: 2.52 cm2
AV Area VTI: 2.77 cm2
AV Area mean vel: 2.26 cm2
AV Mean grad: 3 mmHg
AV Peak grad: 5.9 mmHg
Ao pk vel: 1.21 m/s
Area-P 1/2: 3.03 cm2
S' Lateral: 3.4 cm

## 2022-08-30 ENCOUNTER — Other Ambulatory Visit: Payer: Self-pay | Admitting: Family Medicine

## 2022-09-03 ENCOUNTER — Telehealth: Payer: Self-pay

## 2022-09-03 NOTE — Telephone Encounter (Signed)
LM to return my call. 

## 2022-09-03 NOTE — Telephone Encounter (Signed)
-----   Message from Park Liter, MD sent at 08/18/2022  8:25 PM EDT ----- Echocardiogram showed preserved left ventricle ejection fraction, diastolic dysfunction no significant valvular pathology,

## 2022-09-06 ENCOUNTER — Telehealth: Payer: Self-pay

## 2022-09-06 NOTE — Telephone Encounter (Signed)
Patient notified of results.

## 2022-09-06 NOTE — Telephone Encounter (Signed)
-----   Message from Park Liter, MD sent at 08/18/2022  8:25 PM EDT ----- Echocardiogram showed preserved left ventricle ejection fraction, diastolic dysfunction no significant valvular pathology,

## 2022-09-09 ENCOUNTER — Other Ambulatory Visit: Payer: Self-pay | Admitting: Family Medicine

## 2022-09-26 ENCOUNTER — Other Ambulatory Visit: Payer: Self-pay | Admitting: Cardiology

## 2022-10-04 ENCOUNTER — Other Ambulatory Visit: Payer: Self-pay | Admitting: Cardiology

## 2022-10-04 NOTE — Telephone Encounter (Signed)
Refill sent to pharmacy.   

## 2022-10-18 ENCOUNTER — Ambulatory Visit: Payer: BC Managed Care – PPO | Admitting: Family Medicine

## 2022-10-18 ENCOUNTER — Other Ambulatory Visit: Payer: Self-pay | Admitting: Cardiology

## 2022-10-31 NOTE — Assessment & Plan Note (Signed)
Maintain Lantus usually using 15 to 25 units daily and only up to 30 units if he has a piece of cake no changes in therapy. Will check hgba1c today

## 2022-10-31 NOTE — Assessment & Plan Note (Signed)
Encourage heart healthy diet such as MIND or DASH diet, increase exercise, avoid trans fats, simple carbohydrates and processed foods, consider a krill or fish or flaxseed oil cap daily. Tolerating Atorvastatin 

## 2022-10-31 NOTE — Assessment & Plan Note (Signed)
Supplement and monitor 

## 2022-10-31 NOTE — Assessment & Plan Note (Signed)
Well controlled, no changes to meds. Encouraged heart healthy diet such as the DASH diet and exercise as tolerated.  °

## 2022-11-01 ENCOUNTER — Ambulatory Visit: Payer: Federal, State, Local not specified - PPO | Admitting: Family Medicine

## 2022-11-01 VITALS — BP 128/64 | HR 78 | Temp 97.8°F | Resp 16 | Ht 71.0 in | Wt 188.4 lb

## 2022-11-01 DIAGNOSIS — E1169 Type 2 diabetes mellitus with other specified complication: Secondary | ICD-10-CM | POA: Diagnosis not present

## 2022-11-01 DIAGNOSIS — E782 Mixed hyperlipidemia: Secondary | ICD-10-CM

## 2022-11-01 DIAGNOSIS — Z23 Encounter for immunization: Secondary | ICD-10-CM

## 2022-11-01 DIAGNOSIS — I10 Essential (primary) hypertension: Secondary | ICD-10-CM

## 2022-11-01 DIAGNOSIS — D649 Anemia, unspecified: Secondary | ICD-10-CM | POA: Diagnosis not present

## 2022-11-01 DIAGNOSIS — E119 Type 2 diabetes mellitus without complications: Secondary | ICD-10-CM

## 2022-11-01 DIAGNOSIS — E669 Obesity, unspecified: Secondary | ICD-10-CM

## 2022-11-01 LAB — CBC WITH DIFFERENTIAL/PLATELET
Basophils Absolute: 0.1 10*3/uL (ref 0.0–0.1)
Basophils Relative: 0.9 % (ref 0.0–3.0)
Eosinophils Absolute: 0.1 10*3/uL (ref 0.0–0.7)
Eosinophils Relative: 1.8 % (ref 0.0–5.0)
HCT: 33.2 % — ABNORMAL LOW (ref 39.0–52.0)
Hemoglobin: 11.2 g/dL — ABNORMAL LOW (ref 13.0–17.0)
Lymphocytes Relative: 33 % (ref 12.0–46.0)
Lymphs Abs: 2.3 10*3/uL (ref 0.7–4.0)
MCHC: 33.6 g/dL (ref 30.0–36.0)
MCV: 89.8 fl (ref 78.0–100.0)
Monocytes Absolute: 0.4 10*3/uL (ref 0.1–1.0)
Monocytes Relative: 5.8 % (ref 3.0–12.0)
Neutro Abs: 4.1 10*3/uL (ref 1.4–7.7)
Neutrophils Relative %: 58.5 % (ref 43.0–77.0)
Platelets: 400 10*3/uL (ref 150.0–400.0)
RBC: 3.7 Mil/uL — ABNORMAL LOW (ref 4.22–5.81)
RDW: 15 % (ref 11.5–15.5)
WBC: 7 10*3/uL (ref 4.0–10.5)

## 2022-11-01 LAB — IRON: Iron: 79 ug/dL (ref 42–165)

## 2022-11-01 NOTE — Progress Notes (Signed)
Subjective:    Patient ID: Gene [name], male    DOB: 11-29-1955, 66 y.o.   MRN: [id number]  Chief Complaint  Patient presents with   Follow-up    Follow up    HPI Patient is in today for follow up on chronic medical concerns. No recent febrile illness or acute hospitalizations. No acute concerns. He continues to stay active daily and maintains a heart healthy diet on most days. Denies CP/palp/SOB/HA/congestion/fevers/GI or GU c/o. Taking meds as prescribed   Past Medical History:  Diagnosis Date   Abnormal stress test 06/01/2018   Ischemia involving LAD territory   Alcohol abuse, episodic 08/07/2008   Qualifier: Diagnosis of  By: Redmond Pulling MD, LauraLee     Anemia    Angina pectoris ([place])    Arthritis    CAD (coronary artery disease)    Chronic pain in right foot    COLONIC POLYPS, HX OF 08/07/2008   Qualifier: Diagnosis of  By: Lorrin Mais     Coronary artery disease 08/07/2008   Qualifier: Diagnosis of  By: Lorrin Mais     Diabetes [facility])    Diabetes mellitus type 2 in obese ([place]) 08/07/2008   Qualifier: Diagnosis of  By: Donney Rankins RMA, Lisa      Diabetes mellitus type II    Diabetic neuropathy ([place])    Diarrhea 03/05/2021   Dog bite of calf, right, initial encounter 12/25/2018   Elevated PSA 05/03/2014   Essential hypertension 08/07/2008   Qualifier: Diagnosis of  By: Donney Rankins RMA, [place], RIGHT 09/10/2008   Qualifier: Diagnosis of  By: Redmond Pulling MD, LauraLee     Gangrene of toe of left foot ([place]) 09/03/2021   GERD 08/07/2008   Qualifier: Diagnosis of  By: Lorrin Mais     GERD (gastroesophageal reflux disease)    H/O tobacco use, presenting hazards to health 05/21/2009   Qualifier: Diagnosis of  By: Wynona Luna  Last cigarette smoked May 30, 2015     History of colonic polyps    History of diabetic ulcer of foot 05/05/2022   History of revascularization procedure of lower extremity 09/14/2021   Hx of CABG 2000   Hyperlipidemia     on meds   Hyperlipidemia, mixed 08/07/2008   Qualifier: Diagnosis of  By: Donney Rankins RMA, Lisa     Hypertension    on meds   Hypomagnesemia    Insomnia 03/25/2013   Left knee pain 11/03/2015   Myocardial infarction [facility]) 2006   Neuromuscular disorder ([place])    NEUROPATHY   Obesity 06/24/2016   Peripheral neuropathy 09/10/2008   Qualifier: Diagnosis of  By: Redmond Pulling MD, [place]     PERIPHERAL VASCULAR DISEASE 08/07/2008   Qualifier: Diagnosis of  By: Lorrin Mais     Plantar fasciitis    Preventative health care 05/03/2014   Psoriasis 05/03/2014   PVD (peripheral vascular disease) ([place])    PTA RLE 12/08 [place]   Skin ulcer of left great toe ([place]) 10/20/2021   SOB (shortness of breath) on exertion 05/02/2018   Status post coronary artery bypass graft 05/11/2018   2000   TOBACCO USER 05/21/2009   Qualifier: Diagnosis of  By: Wynona Luna  Last cigarette smoked May 30, 2015      Past Surgical History:  Procedure Laterality Date   CORONARY ARTERY BYPASS GRAFT  2000   200 SVG to diagonal, LIMA to LAD, SVG to LCX  LEFT HEART CATH AND CORS/GRAFTS ANGIOGRAPHY N/A 06/08/2018   Procedure: LEFT HEART CATH AND CORS/GRAFTS ANGIOGRAPHY;  Surgeon: Belva Crome, MD;  Location: [place] [facility];  Service: Cardiovascular;  Laterality: N/A;   REVISION TOTAL KNEE ARTHROPLASTY Right 2009   TONSILLECTOMY AND ADENOIDECTOMY     VASECTOMY     WISDOM TOOTH EXTRACTION      Family History  Problem Relation Age of Onset   Coronary artery disease Mother    Hypertension Mother    Hyperlipidemia Father    Aneurysm Father        brain   Prostate cancer Brother    Cancer Maternal Uncle    Hyperlipidemia Other    Hypertension Other    Stroke Other    Colon cancer Neg Hx    Esophageal cancer Neg Hx    Liver cancer Neg Hx    Pancreatic cancer Neg Hx    Rectal cancer Neg Hx    Stomach cancer Neg Hx    Colon polyps Neg Hx     Social History   Socioeconomic History    Marital status: Married    Spouse name: Not on file   Number of children: Not on file   Years of education: Not on file   Highest education level: Not on file  Occupational History   Not on file  Tobacco Use   Smoking status: Former    Packs/day: 0.50    Years: 40.00    Total pack years: 20.00    Types: Cigarettes    Quit date: 05/04/2015    Years since quitting: 7.5   Smokeless tobacco: Never  Vaping Use   Vaping Use: Never used  Substance and Sexual Activity   Alcohol use: No    Alcohol/week: 0.0 standard drinks of alcohol   Drug use: No    Comment: rare   Sexual activity: Yes    Comment: lives with wife, retired   Other Topics Concern   Not on file  Social History Narrative   Occupation: TEFL teacher   Second marriage - 3 children from first marriage   Tobacco Use - Yes.     Alcohol Use - yes(rare)           Social Determinants of Health   Financial Resource Strain: Not on file  Food Insecurity: Not on file  Transportation Needs: Not on file  Physical Activity: Not on file  Stress: Not on file  Social Connections: Not on file  Intimate Partner Violence: Not on file    Outpatient Medications Prior to Visit  Medication Sig Dispense Refill   Alpha-D-Galactosidase (ANTI-GAS PO) Take 1 tablet by mouth daily. Unknown strenght     amLODipine (NORVASC) 10 MG tablet Take 1 tablet by mouth once daily 90 tablet 1   aspirin 81 MG EC tablet Take 81 mg by mouth daily.     atorvastatin (LIPITOR) 40 MG tablet Take 1 tablet by mouth once daily 90 tablet 2   clopidogrel (PLAVIX) 75 MG tablet Take 1 tablet by mouth once daily (Patient taking differently: Take 75 mg by mouth daily.) 30 tablet 4   fish oil-omega-3 fatty acids 1000 MG capsule Take 1 g by mouth at bedtime.     gabapentin (NEURONTIN) 600 MG tablet Take 1 tablet (600 mg total) by mouth 2 (two) times daily. May take 1 additional tablet as needed for severe pain. Do not exceed 3 tablets daily. 270 tablet 0   glimepiride  (AMARYL) 4 MG tablet  Take 1 tablet (4 mg total) by mouth in the morning and at bedtime. 180 tablet 0   insulin glargine (LANTUS SOLOSTAR) 100 UNIT/ML Solostar Pen Inject 15-30 Units into the skin at bedtime. 30 mL 5   labetalol (NORMODYNE) 200 MG tablet Take 1 tablet (200 mg total) by mouth 2 (two) times daily. 180 tablet 1   lisinopril (ZESTRIL) 20 MG tablet Take 1 tablet by mouth once daily 90 tablet 2   MAGNESIUM EXTRA STRENGTH 400 MG CAPS TAKE 1 CAPSULE BY MOUTH ONCE DAILY (Patient taking differently: Take 1 capsule by mouth daily.) 30 capsule 3   metFORMIN (GLUCOPHAGE-XR) 500 MG 24 hr tablet Take 2 tablets by mouth twice daily 360 tablet 1   Multiple Vitamins-Minerals (CENTRUM PO) Take 1 tablet by mouth daily. Unknown strenght     nitroGLYCERIN (NITROSTAT) 0.4 MG SL tablet Place 0.4 mg under the tongue every 5 (five) minutes as needed for chest pain.     omeprazole (PRILOSEC) 40 MG capsule Take 1 capsule (40 mg total) by mouth daily. 90 capsule 1   ranolazine (RANEXA) 500 MG 12 hr tablet Take 1 tablet by mouth twice daily 180 tablet 2   Specialty Vitamins Products (MAGNESIUM, AMINO ACID CHELATE,) 133 MG tablet Take 1 tablet by mouth daily. Unknown strenght     VITAMIN D, CHOLECALCIFEROL, PO Take 1 tablet by mouth daily. Unknown strenght     No facility-administered medications prior to visit.    No Known Allergies  Review of Systems  Constitutional:  Negative for fever and malaise/fatigue.  HENT:  Negative for congestion.   Eyes:  Negative for blurred vision.  Respiratory:  Negative for shortness of breath.   Cardiovascular: Negative.  Negative for chest pain, palpitations and leg swelling.  Gastrointestinal:  Negative for abdominal pain, blood in stool and nausea.  Genitourinary:  Negative for dysuria and frequency.  Musculoskeletal:  Negative for falls.  Skin:  Negative for rash.  Neurological:  Negative for dizziness, loss of consciousness and headaches.  Endo/Heme/Allergies:   Negative for environmental allergies.  Psychiatric/Behavioral:  Negative for depression. The patient is not nervous/anxious.        Objective:    Physical Exam Constitutional:      General: He is not in acute distress.    Appearance: Normal appearance. He is not ill-appearing or toxic-appearing.  HENT:     Head: Normocephalic and atraumatic.     Right Ear: Tympanic membrane and external ear normal.     Left Ear: Tympanic membrane and external ear normal.     Nose: Nose normal.  Eyes:     General:        Right eye: No discharge.        Left eye: No discharge.     Extraocular Movements: Extraocular movements intact.     Conjunctiva/sclera: Conjunctivae normal.  Cardiovascular:     Rate and Rhythm: Normal rate and regular rhythm.     Pulses: Normal pulses.     Heart sounds: Normal heart sounds. No murmur heard. Pulmonary:     Effort: Pulmonary effort is normal.     Breath sounds: No wheezing.  Abdominal:     General: Bowel sounds are normal.     Tenderness: There is no abdominal tenderness. There is no guarding.  Musculoskeletal:     Right lower leg: No edema.     Left lower leg: No edema.  Skin:    Findings: No rash.  Neurological:     Mental Status: He  is alert and oriented to person, place, and time.  Psychiatric:        Behavior: Behavior normal.     BP 128/64 (BP Location: Right Arm, Patient Position: Sitting, Cuff Size: Normal)   Pulse 78   Temp 97.8 F (36.6 C) (Oral)   Resp 16   Ht '5\' 11"'$  (1.803 m)   Wt 188 lb 6.4 oz (85.5 kg)   SpO2 96%   BMI 26.28 kg/m  Wt Readings from Last 3 Encounters:  11/01/22 188 lb 6.4 oz (85.5 kg)  07/20/22 188 lb (85.3 kg)  07/05/22 187 lb (84.8 kg)    Diabetic Foot Exam - Simple   No data filed    Lab Results  Component Value Date   WBC 6.4 07/05/2022   HGB 10.9 (L) 07/05/2022   HCT 33.0 (L) 07/05/2022   PLT 333.0 07/05/2022   GLUCOSE 107 (H) 07/05/2022   CHOL 95 07/05/2022   TRIG 54.0 07/05/2022   HDL 33.80  (L) 07/05/2022   LDLCALC 51 07/05/2022   ALT 13 07/05/2022   AST 15 07/05/2022   NA 140 07/05/2022   K 4.7 07/05/2022   CL 101 07/05/2022   CREATININE 0.95 07/05/2022   BUN 14 07/05/2022   CO2 30 07/05/2022   TSH 1.34 07/05/2022   PSA 10.42 (H) 10/20/2021   INR 1.1 07/31/2008   HGBA1C 8.8 (H) 07/05/2022   MICROALBUR 83.2 (H) 07/05/2022    Lab Results  Component Value Date   TSH 1.34 07/05/2022   Lab Results  Component Value Date   WBC 6.4 07/05/2022   HGB 10.9 (L) 07/05/2022   HCT 33.0 (L) 07/05/2022   MCV 90.0 07/05/2022   PLT 333.0 07/05/2022   Lab Results  Component Value Date   NA 140 07/05/2022   K 4.7 07/05/2022   CO2 30 07/05/2022   GLUCOSE 107 (H) 07/05/2022   BUN 14 07/05/2022   CREATININE 0.95 07/05/2022   BILITOT 0.4 07/05/2022   ALKPHOS 89 07/05/2022   AST 15 07/05/2022   ALT 13 07/05/2022   PROT 6.6 07/05/2022   ALBUMIN 4.0 07/05/2022   CALCIUM 8.9 07/05/2022   ANIONGAP 14 06/08/2018   GFR 82.85 07/05/2022   Lab Results  Component Value Date   CHOL 95 07/05/2022   Lab Results  Component Value Date   HDL 33.80 (L) 07/05/2022   Lab Results  Component Value Date   LDLCALC 51 07/05/2022   Lab Results  Component Value Date   TRIG 54.0 07/05/2022   Lab Results  Component Value Date   CHOLHDL 3 07/05/2022   Lab Results  Component Value Date   HGBA1C 8.8 (H) 07/05/2022       Assessment & Plan:  Diabetes mellitus type 2 in obese [facility]) Assessment & Plan:  Maintain Lantus usually using 15 to 25 units daily and only up to 30 units if he has a piece of cake no changes in therapy. He just had a hgba1c done with [employer] so he will forward [place] the results when he gets them   Essential hypertension Assessment & Plan: Well controlled, no changes to meds. Encouraged heart healthy diet such as the DASH diet and exercise as tolerated.    Hyperlipidemia, mixed Assessment & Plan: Encourage heart healthy diet such as MIND or DASH diet, increase  exercise, avoid trans fats, simple carbohydrates and processed foods, consider a krill or fish or flaxseed oil cap daily. Tolerating Atorvastatin   Hypomagnesemia Assessment & Plan: Supplement and monitor  Anemia, unspecified type Assessment & Plan: Increase leafy greens, consider increased lean red meat and using cast iron cookware. Continue to monitor, report any concerns   Orders: -     CBC with Differential/Platelet -     Iron  Need for influenza vaccination -     Flu Vaccine QUAD High Dose(Fluad)    Penni Homans, MD

## 2022-11-01 NOTE — Patient Instructions (Addendum)
RSV, Respiratory Syncitial Virus, Arexvy once   Anemia  Anemia is a condition in which there are not enough red blood cells or hemoglobin in the blood. Hemoglobin is a substance in red blood cells that carries oxygen. When you do not have enough red blood cells or hemoglobin (are anemic), your body cannot get enough oxygen, and your organs may not work properly. As a result, you may feel very tired or have other problems. What are the causes? Common causes of anemia include: Excessive bleeding. Anemia can be caused by excessive bleeding inside or outside the body, including bleeding from the intestines or from heavy menstrual periods in females. Poor nutrition. Long-lasting (chronic) kidney, thyroid, and liver disease. Bone marrow disorders, spleen problems, and blood disorders. Cancer and treatments for cancer. Human immunodeficiency virus (HIV) and acquired immunodeficiency syndrome (AIDS). Infections, medicines, and autoimmune disorders that destroy red blood cells. What are the signs or symptoms? Symptoms of this condition include: Minor weakness. Dizziness. Headache, or difficulties concentrating and sleeping. Heartbeats that feel irregular or faster than normal (palpitations). Shortness of breath, especially with exercise. Pale skin, lips, and nails, or cold hands and feet. Upset stomach (indigestion) and nausea. Symptoms may occur suddenly or develop slowly. If your anemia is mild, you may not have symptoms. How is this diagnosed? This condition is diagnosed based on blood tests, your medical history, and a physical exam. In some cases, a test may be needed in which cells are removed from the soft tissue inside of a bone and looked at under a microscope (bone marrow biopsy). Your health care provider may also check your stool (feces) for blood and may do more testing to look for the cause of your bleeding. Other tests may include: Imaging tests, such as a CT scan or MRI. A  procedure to see inside your esophagus and stomach (endoscopy). The esophagus is the part of the body that moves food from your mouth to your stomach. A procedure to see inside your colon and rectum (colonoscopy). How is this treated? Treatment for this condition depends on the cause. If you continue to lose a lot of blood, you may need to be treated at a hospital. Treatment may include: Taking supplements of iron, vitamin P53, or folic acid. Taking a hormone medicine (erythropoietin) that can help to stimulate red blood cell growth. Receiving donated blood through an IV (blood transfusion). This may be needed if you lose a lot of blood. Making changes to your diet. Having surgery to remove your spleen. Follow these instructions at home: Take over-the-counter and prescription medicines only as told by your health care provider. Take supplements only as told by your health care provider. Follow any diet instructions that you were given by your health care provider. Keep all follow-up visits. Your health care provider will want to recheck your blood tests. Contact a health care provider if: You develop new bleeding anywhere in the body. You are very weak. Get help right away if: You are short of breath. You have pain in your abdomen or chest. You are dizzy or feel faint. You have trouble concentrating. You have bloody stools, black stools, or tarry stools. You vomit repeatedly or you vomit up blood. These symptoms may be an emergency. Get help right away. Call 911. Do not wait to see if the symptoms will go away. Do not drive yourself to the hospital. Summary Anemia is a condition in which you do not have enough red blood cells or enough of a substance  in your red blood cells that carries oxygen. Symptoms may occur suddenly or develop slowly. If your anemia is mild, you may not have symptoms. This condition is diagnosed with blood tests, a medical history, and a physical exam. Other  tests may be needed. Treatment for this condition depends on the cause of the anemia. This information is not intended to replace advice given to you by your health care provider. Make sure you discuss any questions you have with your health care provider. Document Revised: 01/25/2022 Document Reviewed: 01/25/2022 Elsevier Patient Education  [place].

## 2022-11-01 NOTE — Assessment & Plan Note (Signed)
Increase leafy greens, consider increased lean red meat and using cast iron cookware. Continue to monitor, report any concerns 

## 2022-12-07 ENCOUNTER — Other Ambulatory Visit: Payer: Self-pay | Admitting: Family Medicine

## 2022-12-08 ENCOUNTER — Other Ambulatory Visit: Payer: Self-pay | Admitting: Family Medicine

## 2023-01-11 ENCOUNTER — Other Ambulatory Visit: Payer: Self-pay | Admitting: Family Medicine

## 2023-01-12 ENCOUNTER — Other Ambulatory Visit: Payer: Self-pay | Admitting: Family Medicine

## 2023-01-16 ENCOUNTER — Other Ambulatory Visit: Payer: Self-pay | Admitting: Family Medicine

## 2023-01-18 ENCOUNTER — Other Ambulatory Visit: Payer: Self-pay | Admitting: Family Medicine

## 2023-01-18 DIAGNOSIS — R52 Pain, unspecified: Secondary | ICD-10-CM

## 2023-01-19 ENCOUNTER — Ambulatory Visit: Payer: Federal, State, Local not specified - PPO | Attending: Cardiology | Admitting: Cardiology

## 2023-01-19 ENCOUNTER — Encounter: Payer: Self-pay | Admitting: Cardiology

## 2023-01-19 VITALS — BP 148/68 | HR 76 | Ht 71.0 in | Wt 186.0 lb

## 2023-01-19 DIAGNOSIS — I739 Peripheral vascular disease, unspecified: Secondary | ICD-10-CM | POA: Diagnosis not present

## 2023-01-19 DIAGNOSIS — I25118 Atherosclerotic heart disease of native coronary artery with other forms of angina pectoris: Secondary | ICD-10-CM | POA: Diagnosis not present

## 2023-01-19 DIAGNOSIS — Z951 Presence of aortocoronary bypass graft: Secondary | ICD-10-CM | POA: Diagnosis not present

## 2023-01-19 DIAGNOSIS — E1169 Type 2 diabetes mellitus with other specified complication: Secondary | ICD-10-CM

## 2023-01-19 DIAGNOSIS — E669 Obesity, unspecified: Secondary | ICD-10-CM

## 2023-01-19 NOTE — Patient Instructions (Signed)

## 2023-01-19 NOTE — Progress Notes (Signed)
Cardiology Office Note:    Date:  01/19/2023   ID:  Gene [name], DOB 1956/08/14, MRN [id number]  PCP:  Gene Lukes, MD  Cardiologist:  Gene Campus, MD    Referring MD: Gene Lukes, MD   No chief complaint on file. Doing well  History of Present Illness:    Gene [name] is a 67 y.o. male with past medical history significant for coronary artery disease, status post coronary bypass grafting many years ago last evaluation of coronary artery was done in form of cardiac catheterization 2019 with no target lesion for intervention, diabetes mellitus which is fairly controlled now, essential hypertension, dyslipidemia. He is in my office today for follow-up cardiac wise doing well still continues to work she sustained some injury to the right shoulder denies have any Complaint.  There is no chest pain tightness squeezing pressure burning chest no palpitation dizziness swelling of lower extremities.  Coronary artery disease seems to  Past Medical History:  Diagnosis Date   Abnormal stress test 06/01/2018   Ischemia involving LAD territory   Alcohol abuse, episodic 08/07/2008   Qualifier: Diagnosis of  By: Redmond Pulling MD, LauraLee     Anemia    Angina pectoris ([place])    Arthritis    CAD (coronary artery disease)    Chronic pain in right foot    COLONIC POLYPS, HX OF 08/07/2008   Qualifier: Diagnosis of  By: Lorrin Mais     Coronary artery disease 08/07/2008   Qualifier: Diagnosis of  By: Lorrin Mais     Diabetes [facility])    Diabetes mellitus type 2 in obese ([place]) 08/07/2008   Qualifier: Diagnosis of  By: Donney Rankins RMA, Lisa      Diabetes mellitus type II    Diabetic neuropathy ([place])    Diarrhea 03/05/2021   Dog bite of calf, right, initial encounter 12/25/2018   Elevated PSA 05/03/2014   Essential hypertension 08/07/2008   Qualifier: Diagnosis of  By: Donney Rankins RMA, [place], RIGHT 09/10/2008   Qualifier: Diagnosis of  By: Redmond Pulling MD, LauraLee      Gangrene of toe of left foot ([place]) 09/03/2021   GERD 08/07/2008   Qualifier: Diagnosis of  By: Lorrin Mais     GERD (gastroesophageal reflux disease)    H/O tobacco use, presenting hazards to health 05/21/2009   Qualifier: Diagnosis of  By: Wynona Luna  Last cigarette smoked May 30, 2015     History of colonic polyps    History of diabetic ulcer of foot 05/05/2022   History of revascularization procedure of lower extremity 09/14/2021   Hx of CABG 2000   Hyperlipidemia    on meds   Hyperlipidemia, mixed 08/07/2008   Qualifier: Diagnosis of  By: Donney Rankins RMA, Lisa     Hypertension    on meds   Hypomagnesemia    Insomnia 03/25/2013   Left knee pain 11/03/2015   Myocardial infarction [facility]) 2006   Neuromuscular disorder ([place])    NEUROPATHY   Obesity 06/24/2016   Peripheral neuropathy 09/10/2008   Qualifier: Diagnosis of  By: Redmond Pulling MD, [place]     PERIPHERAL VASCULAR DISEASE 08/07/2008   Qualifier: Diagnosis of  By: Lorrin Mais     Plantar fasciitis    Preventative health care 05/03/2014   Psoriasis 05/03/2014   PVD (peripheral vascular disease) ([place])    PTA RLE 12/08 [place]   Skin ulcer of left great toe ([place]) 10/20/2021  SOB (shortness of breath) on exertion 05/02/2018   Status post coronary artery bypass graft 05/11/2018   2000   TOBACCO USER 05/21/2009   Qualifier: Diagnosis of  By: Wynona Luna  Last cigarette smoked May 30, 2015      Past Surgical History:  Procedure Laterality Date   CORONARY ARTERY BYPASS GRAFT  2000   200 SVG to diagonal, LIMA to LAD, SVG to LCX   LEFT HEART CATH AND CORS/GRAFTS ANGIOGRAPHY N/A 06/08/2018   Procedure: LEFT HEART CATH AND CORS/GRAFTS ANGIOGRAPHY;  Surgeon: Belva Crome, MD;  Location: [facility];  Service: Cardiovascular;  Laterality: N/A;   REVISION TOTAL KNEE ARTHROPLASTY Right 2009   TONSILLECTOMY AND ADENOIDECTOMY     VASECTOMY     WISDOM TOOTH EXTRACTION      Current  Medications: Current Meds  Medication Sig   Alpha-D-Galactosidase (ANTI-GAS PO) Take 1 tablet by mouth daily. Unknown strenght   amLODipine (NORVASC) 10 MG tablet Take 1 tablet by mouth once daily   aspirin 81 MG EC tablet Take 81 mg by mouth daily.   atorvastatin (LIPITOR) 40 MG tablet Take 1 tablet by mouth once daily   clopidogrel (PLAVIX) 75 MG tablet Take 1 tablet by mouth once daily   fish oil-omega-3 fatty acids 1000 MG capsule Take 1 g by mouth at bedtime.   gabapentin (NEURONTIN) 600 MG tablet Take 1 tablet (600 mg total) by mouth 2 (two) times daily. May take 1 additional tablet as needed for severe pain. Do not exceed 3 tablets daily.   glimepiride (AMARYL) 4 MG tablet TAKE 1 TABLET BY MOUTH IN THE MORNING AND AT BEDTIME   insulin glargine (LANTUS SOLOSTAR) 100 UNIT/ML Solostar Pen Inject 15-30 Units into the skin at bedtime.   labetalol (NORMODYNE) 200 MG tablet Take 1 tablet by mouth twice daily   lisinopril (ZESTRIL) 20 MG tablet Take 1 tablet by mouth once daily   MAGNESIUM EXTRA STRENGTH 400 MG CAPS TAKE 1 CAPSULE BY MOUTH ONCE DAILY (Patient taking differently: Take 1 capsule by mouth daily.)   metFORMIN (GLUCOPHAGE-XR) 500 MG 24 hr tablet Take 2 tablets by mouth twice daily   Multiple Vitamins-Minerals (CENTRUM PO) Take 1 tablet by mouth daily. Unknown strenght   nitroGLYCERIN (NITROSTAT) 0.4 MG SL tablet Place 0.4 mg under the tongue every 5 (five) minutes as needed for chest pain.   omeprazole (PRILOSEC) 40 MG capsule TAKE 1 CAPSULE BY MOUTH ONCE DAILY   ranolazine (RANEXA) 500 MG 12 hr tablet Take 1 tablet by mouth twice daily   Specialty Vitamins Products (MAGNESIUM, AMINO ACID CHELATE,) 133 MG tablet Take 1 tablet by mouth daily. Unknown strenght   VITAMIN D, CHOLECALCIFEROL, PO Take 1 tablet by mouth daily. Unknown strenght     Allergies:   Patient has no known allergies.   Social History   Socioeconomic History   Marital status: Married    Spouse name: Not on  file   Number of children: Not on file   Years of education: Not on file   Highest education level: Not on file  Occupational History   Not on file  Tobacco Use   Smoking status: Former    Packs/day: 0.50    Years: 40.00    Total pack years: 20.00    Types: Cigarettes    Quit date: 05/04/2015    Years since quitting: 7.7   Smokeless tobacco: Never  Vaping Use   Vaping Use: Never used  Substance and Sexual  Activity   Alcohol use: No    Alcohol/week: 0.0 standard drinks of alcohol   Drug use: No    Comment: rare   Sexual activity: Yes    Comment: lives with wife, retired   Other Topics Concern   Not on file  Social History Narrative   Occupation: TEFL teacher   Second marriage - 3 children from first marriage   Tobacco Use - Yes.     Alcohol Use - yes(rare)           Social Determinants of Health   Financial Resource Strain: Not on file  Food Insecurity: Not on file  Transportation Needs: Not on file  Physical Activity: Not on file  Stress: Not on file  Social Connections: Not on file     Family History: The patient's family history includes Aneurysm in his father; Cancer in his maternal uncle; Coronary artery disease in his mother; Hyperlipidemia in his father and another family member; Hypertension in his mother and another family member; Prostate cancer in his brother; Stroke in an other family member. There is no history of Colon cancer, Esophageal cancer, Liver cancer, Pancreatic cancer, Rectal cancer, Stomach cancer, or Colon polyps. ROS:   Please see the history of present illness.    All 14 point review of systems negative except as described per history of present illness  EKGs/Labs/Other Studies Reviewed:      Recent Labs: 07/05/2022: ALT 13; BUN 14; Creatinine, Ser 0.95; Potassium 4.7; Sodium 140; TSH 1.34 11/01/2022: Hemoglobin 11.2; Platelets 400.0  Recent Lipid Panel    Component Value Date/Time   CHOL 95 07/05/2022 1205   CHOL 101 02/04/2020  1652   TRIG 54.0 07/05/2022 1205   HDL 33.80 (L) 07/05/2022 1205   HDL 31 (L) 02/04/2020 1652   CHOLHDL 3 07/05/2022 1205   VLDL 10.8 07/05/2022 1205   LDLCALC 51 07/05/2022 1205   LDLCALC 48 02/04/2020 1652    Physical Exam:    VS:  BP (!) 148/68 (BP Location: Left Arm, Patient Position: Sitting)   Pulse 76   Ht '5\' 11"'$  (1.803 m)   Wt 186 lb (84.4 kg)   SpO2 97%   BMI 25.94 kg/m     Wt Readings from Last 3 Encounters:  01/19/23 186 lb (84.4 kg)  11/01/22 188 lb 6.4 oz (85.5 kg)  07/20/22 188 lb (85.3 kg)     GEN:  Well nourished, well developed in no acute distress HEENT: Normal NECK: No JVD; No carotid bruits LYMPHATICS: No lymphadenopathy CARDIAC: RRR, no murmurs, no rubs, no gallops RESPIRATORY:  Clear to auscultation without rales, wheezing or rhonchi  ABDOMEN: Soft, non-tender, non-distended MUSCULOSKELETAL:  No edema; No deformity  SKIN: Warm and dry LOWER EXTREMITIES: no swelling NEUROLOGIC:  Alert and oriented x 3 PSYCHIATRIC:  Normal affect   ASSESSMENT:    1. Status post coronary artery bypass graft   2. Coronary artery disease of native artery of native heart with stable angina pectoris ([place])   3. PVD (peripheral vascular disease) ([place])   4. Diabetes mellitus type 2 in obese [facility])    PLAN:    In order of problems listed above:  Coronary disease stable from that point review we will continue present management.  That include dual antiplatelet therapy has no difficulty tolerating it. Diabetes mellitus that be followed by antimedicine team I did review K PN which show me data from summer of last year with hemoglobin A1c of 8.8 he understand he need to do better.  Dyslipidemia I did review K PN which show me LDL 51 HDL 33 we will continue present management. Peripheral vascular disease.  Stable Will order some additional testing   Medication Adjustments/Labs and Tests Ordered: Current medicines are reviewed at length with the patient today.  Concerns  regarding medicines are outlined above.  No orders of the defined types were placed in this encounter.  Medication changes: No orders of the defined types were placed in this encounter.   Signed, Park Liter, MD, [facility] 01/19/2023 8:28 AM    [place]

## 2023-02-23 ENCOUNTER — Other Ambulatory Visit: Payer: Self-pay | Admitting: Family Medicine

## 2023-03-06 NOTE — Assessment & Plan Note (Signed)
Encourage heart healthy diet such as MIND or DASH diet, increase exercise, avoid trans fats, simple carbohydrates and processed foods, consider a krill or fish or flaxseed oil cap daily. Tolerating Atorvastatin 

## 2023-03-06 NOTE — Assessment & Plan Note (Signed)
Supplement and monitor 

## 2023-03-06 NOTE — Assessment & Plan Note (Signed)
Avoid offending foods, start probiotics. Do not eat large meals in late evening and consider raising head of bed.  

## 2023-03-06 NOTE — Assessment & Plan Note (Signed)
Well controlled, no changes to meds. Encouraged heart healthy diet such as the DASH diet and exercise as tolerated.  °

## 2023-03-06 NOTE — Assessment & Plan Note (Signed)
Maintain Lantus usually using 15 to 25 units daily and only up to 30 units if he has a piece of cake no changes in therapy.

## 2023-03-07 ENCOUNTER — Ambulatory Visit: Payer: Federal, State, Local not specified - PPO | Admitting: Family Medicine

## 2023-03-07 VITALS — BP 126/78 | HR 80 | Temp 98.0°F | Resp 16 | Ht 71.0 in | Wt 186.4 lb

## 2023-03-07 DIAGNOSIS — D649 Anemia, unspecified: Secondary | ICD-10-CM

## 2023-03-07 DIAGNOSIS — E1169 Type 2 diabetes mellitus with other specified complication: Secondary | ICD-10-CM | POA: Diagnosis not present

## 2023-03-07 DIAGNOSIS — Z7984 Long term (current) use of oral hypoglycemic drugs: Secondary | ICD-10-CM

## 2023-03-07 DIAGNOSIS — E669 Obesity, unspecified: Secondary | ICD-10-CM | POA: Diagnosis not present

## 2023-03-07 DIAGNOSIS — I10 Essential (primary) hypertension: Secondary | ICD-10-CM

## 2023-03-07 DIAGNOSIS — G63 Polyneuropathy in diseases classified elsewhere: Secondary | ICD-10-CM

## 2023-03-07 DIAGNOSIS — E782 Mixed hyperlipidemia: Secondary | ICD-10-CM

## 2023-03-07 DIAGNOSIS — K219 Gastro-esophageal reflux disease without esophagitis: Secondary | ICD-10-CM

## 2023-03-07 LAB — COMPREHENSIVE METABOLIC PANEL
ALT: 11 U/L (ref 0–53)
AST: 15 U/L (ref 0–37)
Albumin: 4 g/dL (ref 3.5–5.2)
Alkaline Phosphatase: 86 U/L (ref 39–117)
BUN: 18 mg/dL (ref 6–23)
CO2: 28 mEq/L (ref 19–32)
Calcium: 9.3 mg/dL (ref 8.4–10.5)
Chloride: 99 mEq/L (ref 96–112)
Creatinine, Ser: 1.06 mg/dL (ref 0.40–1.50)
GFR: 72.3 mL/min (ref >60.00)
Glucose, Bld: 207 mg/dL — ABNORMAL HIGH (ref 70–99)
Potassium: 4.8 mEq/L (ref 3.5–5.1)
Sodium: 136 mEq/L (ref 135–145)
Total Bilirubin: 0.4 mg/dL (ref 0.2–1.2)
Total Protein: 6.6 g/dL (ref 6.0–8.3)

## 2023-03-07 LAB — LIPID PANEL
Cholesterol: 102 mg/dL (ref 0–200)
HDL: 31.7 mg/dL — ABNORMAL LOW (ref >39.00)
LDL Cholesterol: 48 mg/dL (ref 0–99)
NonHDL: 70.17
Total CHOL/HDL Ratio: 3
Triglycerides: 111 mg/dL (ref 0.0–149.0)
VLDL: 22.2 mg/dL (ref 0.0–40.0)

## 2023-03-07 LAB — CBC WITH DIFFERENTIAL/PLATELET
Basophils Absolute: 0.1 10*3/uL (ref 0.0–0.1)
Basophils Relative: 0.7 % (ref 0.0–3.0)
Eosinophils Absolute: 0.1 10*3/uL (ref 0.0–0.7)
Eosinophils Relative: 1.6 % (ref 0.0–5.0)
HCT: 33.8 % — ABNORMAL LOW (ref 39.0–52.0)
Hemoglobin: 11.5 g/dL — ABNORMAL LOW (ref 13.0–17.0)
Lymphocytes Relative: 23.7 % (ref 12.0–46.0)
Lymphs Abs: 2 10*3/uL (ref 0.7–4.0)
MCHC: 33.9 g/dL (ref 30.0–36.0)
MCV: 90.4 fl (ref 78.0–100.0)
Monocytes Absolute: 0.4 10*3/uL (ref 0.1–1.0)
Monocytes Relative: 4.8 % (ref 3.0–12.0)
Neutro Abs: 5.8 10*3/uL (ref 1.4–7.7)
Neutrophils Relative %: 69.2 % (ref 43.0–77.0)
Platelets: 379 10*3/uL (ref 150.0–400.0)
RBC: 3.74 Mil/uL — ABNORMAL LOW (ref 4.22–5.81)
RDW: 15.4 % (ref 11.5–15.5)
WBC: 8.5 10*3/uL (ref 4.0–10.5)

## 2023-03-07 LAB — HEMOGLOBIN A1C: Hgb A1c MFr Bld: 9.2 % — ABNORMAL HIGH (ref 4.6–6.5)

## 2023-03-07 LAB — TSH: TSH: 1.19 u[IU]/mL (ref 0.35–5.50)

## 2023-03-07 MED ORDER — TIZANIDINE HCL 2 MG PO TABS: 1.0000 mg | ORAL_TABLET | Freq: Four times a day (QID) | ORAL | 0 refills | Status: DC | PRN

## 2023-03-07 NOTE — Patient Instructions (Signed)

## 2023-03-07 NOTE — Progress Notes (Signed)
Subjective:   By signing my name below, I, Barrett Shell, attest that this documentation has been prepared under the direction and in the presence of Bradd Canary, MD. 03/07/2023   Patient ID: Gene [name], male    DOB: 1956/06/18, 67 y.o.   MRN: [id number]  Chief Complaint  Patient presents with   Follow-up    Follow up    HPI Patient is in today for a follow-up appointment.   Acute  He denies having any blood in stool, chest pain, SOB, or recent illness.  Balance He complains that is equilibrium and balance has worsened. He also complains of worsening neuropathy. He stumbles and falls often at home. His legs feel heavy when he lifts them to walk. He reports recently falling on the side of a mountain due to the uneven terrain. He denies having any dizziness or visual disturbance as a result of his balance issues. He has loss of hearing and pain in his right ear.   Back pain He complains of back pain and a displaced shoulder from work. He denies having any shoulder pain. He is not interested in seeing orthopedics at this time. His back pain is in the sacroiliac region and does not radiate down the leg. He takes tylenol to manage it.    Blood Sugar  His fasting blood sugar was 77 this morning. His A1C was elevated during his last blood work.  He reports having no feeling in his feet up to his mid calf.  Lab Results  Component Value Date   HGBA1C 9.2 (H) 03/07/2023    Vitamins He takes a multivitamin daily.    Past Medical History:  Diagnosis Date   Abnormal stress test 06/01/2018   Ischemia involving LAD territory   Alcohol abuse, episodic 08/07/2008   Qualifier: Diagnosis of  By: Andrey Campanile MD, LauraLee     Anemia    Angina pectoris (HCC)    Arthritis    CAD (coronary artery disease)    Chronic pain in right foot    COLONIC POLYPS, HX OF 08/07/2008   Qualifier: Diagnosis of  By: Kathy Breach     Coronary artery disease 08/07/2008   Qualifier: Diagnosis of   By: Kathy Breach     Diabetes [facility])    Diabetes mellitus type 2 in obese (HCC) 08/07/2008   Qualifier: Diagnosis of  By: Ardell Isaacs RMA, Lisa      Diabetes mellitus type II    Diabetic neuropathy (HCC)    Diarrhea 03/05/2021   Dog bite of calf, right, initial encounter 12/25/2018   Elevated PSA 05/03/2014   Essential hypertension 08/07/2008   Qualifier: Diagnosis of  By: Ardell Isaacs RMA, Lisa     FOOT PAIN, RIGHT 09/10/2008   Qualifier: Diagnosis of  By: Andrey Campanile MD, LauraLee     Gangrene of toe of left foot (HCC) 09/03/2021   GERD 08/07/2008   Qualifier: Diagnosis of  By: Kathy Breach     GERD (gastroesophageal reflux disease)    H/O tobacco use, presenting hazards to health 05/21/2009   Qualifier: Diagnosis of  By: Nena Jordan  Last cigarette smoked May 30, 2015     History of colonic polyps    History of diabetic ulcer of foot 05/05/2022   History of revascularization procedure of lower extremity 09/14/2021   Hx of CABG 2000   Hyperlipidemia    on meds   Hyperlipidemia, mixed 08/07/2008   Qualifier: Diagnosis of  By: Ardell Isaacs RMA,  Lisa     Hypertension    on meds   Hypomagnesemia    Insomnia 03/25/2013   Left knee pain 11/03/2015   Myocardial infarction [facility]) 2006   Neuromuscular disorder (HCC)    NEUROPATHY   Obesity 06/24/2016   Peripheral neuropathy 09/10/2008   Qualifier: Diagnosis of  By: Andrey Campanile MD, LauraLee     PERIPHERAL VASCULAR DISEASE 08/07/2008   Qualifier: Diagnosis of  By: Kathy Breach     Plantar fasciitis    Preventative health care 05/03/2014   Psoriasis 05/03/2014   PVD (peripheral vascular disease) (HCC)    PTA RLE 12/08 [place]   Skin ulcer of left great toe (HCC) 10/20/2021   SOB (shortness of breath) on exertion 05/02/2018   Status post coronary artery bypass graft 05/11/2018   2000   TOBACCO USER 05/21/2009   Qualifier: Diagnosis of  By: Nena Jordan  Last cigarette smoked May 30, 2015      Past Surgical History:   Procedure Laterality Date   CORONARY ARTERY BYPASS GRAFT  2000   200 SVG to diagonal, LIMA to LAD, SVG to LCX   LEFT HEART CATH AND CORS/GRAFTS ANGIOGRAPHY N/A 06/08/2018   Procedure: LEFT HEART CATH AND CORS/GRAFTS ANGIOGRAPHY;  Surgeon: Lyn Records, MD;  Location: [facility] INVASIVE CV LAB;  Service: Cardiovascular;  Laterality: N/A;   REVISION TOTAL KNEE ARTHROPLASTY Right 2009   TONSILLECTOMY AND ADENOIDECTOMY     VASECTOMY     WISDOM TOOTH EXTRACTION      Family History  Problem Relation Age of Onset   Coronary artery disease Mother    Hypertension Mother    Hyperlipidemia Father    Aneurysm Father        brain   Prostate cancer Brother    Cancer Maternal Uncle    Hyperlipidemia Other    Hypertension Other    Stroke Other    Colon cancer Neg Hx    Esophageal cancer Neg Hx    Liver cancer Neg Hx    Pancreatic cancer Neg Hx    Rectal cancer Neg Hx    Stomach cancer Neg Hx    Colon polyps Neg Hx     Social History   Socioeconomic History   Marital status: Married    Spouse name: Not on file   Number of children: Not on file   Years of education: Not on file   Highest education level: Not on file  Occupational History   Not on file  Tobacco Use   Smoking status: Former    Packs/day: 0.50    Years: 40.00    Additional pack years: 0.00    Total pack years: 20.00    Types: Cigarettes    Quit date: 05/04/2015    Years since quitting: 7.8   Smokeless tobacco: Never  Vaping Use   Vaping Use: Never used  Substance and Sexual Activity   Alcohol use: No    Alcohol/week: 0.0 standard drinks of alcohol   Drug use: No    Comment: rare   Sexual activity: Yes    Comment: lives with wife, retired   Other Topics Concern   Not on file  Social History Narrative   Occupation: Licensed conveyancer   Second marriage - 3 children from first marriage   Tobacco Use - Yes.     Alcohol Use - yes(rare)           Social Determinants of Health   Financial Resource Strain: Not on  file  Food Insecurity: Not on file  Transportation Needs: Not on file  Physical Activity: Not on file  Stress: Not on file  Social Connections: Not on file  Intimate Partner Violence: Not on file    Outpatient Medications Prior to Visit  Medication Sig Dispense Refill   Alpha-D-Galactosidase (ANTI-GAS PO) Take 1 tablet by mouth daily. Unknown strenght     amLODipine (NORVASC) 10 MG tablet Take 1 tablet by mouth once daily 90 tablet 0   aspirin 81 MG EC tablet Take 81 mg by mouth daily.     atorvastatin (LIPITOR) 40 MG tablet Take 1 tablet by mouth once daily 90 tablet 2   clopidogrel (PLAVIX) 75 MG tablet Take 1 tablet by mouth once daily 90 tablet 1   fish oil-omega-3 fatty acids 1000 MG capsule Take 1 g by mouth at bedtime.     gabapentin (NEURONTIN) 600 MG tablet TAKE 1 TABLET BY MOUTH TWICE DAILY AND 1 AS NEEDED FOR SEVERE PAIN . DO NOT EXCEED 3 PER 24 HOURS 270 tablet 0   glimepiride (AMARYL) 4 MG tablet TAKE 1 TABLET BY MOUTH IN THE MORNING AND AT BEDTIME 180 tablet 0   insulin glargine (LANTUS SOLOSTAR) 100 UNIT/ML Solostar Pen Inject 15-30 Units into the skin at bedtime. 30 mL 5   labetalol (NORMODYNE) 200 MG tablet Take 1 tablet by mouth twice daily 180 tablet 0   lisinopril (ZESTRIL) 20 MG tablet Take 1 tablet by mouth once daily 90 tablet 2   MAGNESIUM EXTRA STRENGTH 400 MG CAPS TAKE 1 CAPSULE BY MOUTH ONCE DAILY (Patient taking differently: Take 1 capsule by mouth daily.) 30 capsule 3   metFORMIN (GLUCOPHAGE-XR) 500 MG 24 hr tablet Take 2 tablets by mouth twice daily 360 tablet 0   Multiple Vitamins-Minerals (CENTRUM PO) Take 1 tablet by mouth daily. Unknown strenght     nitroGLYCERIN (NITROSTAT) 0.4 MG SL tablet Place 0.4 mg under the tongue every 5 (five) minutes as needed for chest pain.     omeprazole (PRILOSEC) 40 MG capsule TAKE 1 CAPSULE BY MOUTH ONCE DAILY 90 capsule 0   ranolazine (RANEXA) 500 MG 12 hr tablet Take 1 tablet by mouth twice daily 180 tablet 2   Specialty  Vitamins Products (MAGNESIUM, AMINO ACID CHELATE,) 133 MG tablet Take 1 tablet by mouth daily. Unknown strenght     VITAMIN D, CHOLECALCIFEROL, PO Take 1 tablet by mouth daily. Unknown strenght     No facility-administered medications prior to visit.    No Known Allergies  Review of Systems  Constitutional:  Negative for fever and malaise/fatigue.       (-) recent illness  HENT:  Positive for ear pain (right) and hearing loss (right ear). Negative for congestion.   Eyes:  Negative for blurred vision.       (-) visual disturbance  Respiratory:  Negative for shortness of breath.   Cardiovascular:  Negative for chest pain, palpitations and leg swelling.  Gastrointestinal:  Negative for abdominal pain, blood in stool and nausea.  Genitourinary:  Negative for dysuria and frequency.  Musculoskeletal:  Positive for back pain (sacroiliac) and falls.  Skin:  Negative for rash.  Neurological:  Positive for sensory change. Negative for dizziness, loss of consciousness and headaches.       (+) balance  Endo/Heme/Allergies:  Negative for environmental allergies.  Psychiatric/Behavioral:  Negative for depression. The patient is not nervous/anxious.        Objective:    Physical Exam Constitutional:  General: He is not in acute distress.    Appearance: Normal appearance.  HENT:     Head: Normocephalic and atraumatic.     Right Ear: External ear normal.     Left Ear: External ear normal.  Eyes:     Extraocular Movements: Extraocular movements intact.     Pupils: Pupils are equal, round, and reactive to light.  Cardiovascular:     Rate and Rhythm: Normal rate and regular rhythm.     Heart sounds: Normal heart sounds. No murmur heard.    No gallop.  Pulmonary:     Effort: Pulmonary effort is normal. No respiratory distress.     Breath sounds: No wheezing or rales.  Skin:    General: Skin is warm.  Neurological:     Mental Status: He is alert and oriented to person, place, and  time.  Psychiatric:        Judgment: Judgment normal.     BP 126/78 (BP Location: Right Arm, Patient Position: Sitting, Cuff Size: Normal)   Pulse 80   Temp 98 F (36.7 C) (Oral)   Resp 16   Ht 5\' 11"  (1.803 m)   Wt 186 lb 6.4 oz (84.6 kg)   SpO2 98%   BMI 26.00 kg/m  Wt Readings from Last 3 Encounters:  03/07/23 186 lb 6.4 oz (84.6 kg)  01/19/23 186 lb (84.4 kg)  11/01/22 188 lb 6.4 oz (85.5 kg)       Assessment & Plan:  Type 2 diabetes mellitus with obesity Assessment & Plan:  Maintain Lantus usually using 15 to 25 units daily and only up to 30 units if he has a piece of cake no changes in therapy.   Orders: -     Hemoglobin A1c -     Comprehensive metabolic panel  Essential hypertension Assessment & Plan: Well controlled, no changes to meds. Encouraged heart healthy diet such as the DASH diet and exercise as tolerated.   Orders: -     CBC with Differential/Platelet -     TSH  Hyperlipidemia, mixed Assessment & Plan: Encourage heart healthy diet such as MIND or DASH diet, increase exercise, avoid trans fats, simple carbohydrates and processed foods, consider a krill or fish or flaxseed oil cap daily. Tolerating Atorvastatin  Orders: -     Lipid panel  Hypomagnesemia Assessment & Plan: Supplement and monitor    Gastroesophageal reflux disease, unspecified whether esophagitis present Assessment & Plan: Avoid offending foods, start probiotics. Do not eat large meals in late evening and consider raising head of bed.     Anemia, unspecified type -     Iron, TIBC and Ferritin Panel  Polyneuropathy associated with underlying disease Assessment & Plan: Continues to worsen due to his complex medical history and is struggling more with balance and ambulation. He had a near fall at [employer] recently. He does not feel he can work as much and worries about the progression. Letter provided for him to share at his discretion outlining her medical conditions and  current state.     Other orders -     tiZANidine HCl; Take 0.5-2 tablets (1-4 mg total) by mouth every 6 (six) hours as needed for muscle spasms.  Dispense: 20 tablet; Refill: 0    I, Danise Edge, MD, personally preformed the services described in this documentation.  All medical record entries made by the scribe were at my direction and in my presence.  I have reviewed the chart and discharge instructions (if  applicable) and agree that the record reflects my personal performance and is accurate and complete. 03/07/2023  Danise Edge, MD   Mercer Pod as a scribe for Danise Edge, MD.,have documented all relevant documentation on the behalf of Danise Edge, MD,as directed by  Danise Edge, MD while in the presence of Danise Edge, MD.

## 2023-03-08 ENCOUNTER — Other Ambulatory Visit: Payer: Self-pay | Admitting: Family Medicine

## 2023-03-08 ENCOUNTER — Encounter: Payer: Self-pay | Admitting: Family Medicine

## 2023-03-08 LAB — IRON,TIBC AND FERRITIN PANEL
%SAT: 31 % (calc) (ref 20–48)
Ferritin: 11 ng/mL — ABNORMAL LOW (ref 24–380)
Iron: 110 ug/dL (ref 50–180)
TIBC: 359 mcg/dL (calc) (ref 250–425)

## 2023-03-08 NOTE — Assessment & Plan Note (Signed)
Continues to worsen due to his complex medical history and is struggling more with balance and ambulation. He had a near fall at [employer] recently. He does not feel he can work as much and worries about the progression. Letter provided for him to share at his discretion outlining her medical conditions and current state.

## 2023-03-14 ENCOUNTER — Other Ambulatory Visit: Payer: Self-pay | Admitting: Family Medicine

## 2023-03-16 ENCOUNTER — Other Ambulatory Visit: Payer: Self-pay | Admitting: Family Medicine

## 2023-03-22 ENCOUNTER — Other Ambulatory Visit: Payer: Self-pay | Admitting: Family Medicine

## 2023-03-23 ENCOUNTER — Telehealth: Payer: Self-pay

## 2023-03-23 NOTE — Telephone Encounter (Signed)
Called pt lvm we have letter ready and  Also need to discuss lab results.

## 2023-03-28 ENCOUNTER — Encounter: Payer: Self-pay | Admitting: *Deleted

## 2023-03-28 ENCOUNTER — Encounter: Payer: Self-pay | Admitting: Family Medicine

## 2023-03-28 NOTE — Telephone Encounter (Signed)
Pt called to see if he can pick letter up today. Letter is not up front so advised patient that I will need to send message back to see where the letter is so we can have it ready for him. Please call pt to advise if he is able to come get it today

## 2023-03-29 NOTE — Telephone Encounter (Signed)
Pt received letter

## 2023-04-08 ENCOUNTER — Other Ambulatory Visit: Payer: Self-pay | Admitting: Family Medicine

## 2023-04-12 ENCOUNTER — Other Ambulatory Visit: Payer: Self-pay | Admitting: Family Medicine

## 2023-04-12 DIAGNOSIS — R52 Pain, unspecified: Secondary | ICD-10-CM

## 2023-04-13 ENCOUNTER — Encounter: Payer: Self-pay | Admitting: Gastroenterology

## 2023-04-18 ENCOUNTER — Other Ambulatory Visit: Payer: Self-pay | Admitting: Family Medicine

## 2023-04-19 ENCOUNTER — Other Ambulatory Visit: Payer: Self-pay | Admitting: Cardiology

## 2023-04-20 NOTE — Telephone Encounter (Signed)
Refill sent.

## 2023-05-21 ENCOUNTER — Other Ambulatory Visit: Payer: Self-pay | Admitting: Family Medicine

## 2023-05-24 ENCOUNTER — Other Ambulatory Visit: Payer: Self-pay | Admitting: Family Medicine

## 2023-06-04 ENCOUNTER — Other Ambulatory Visit: Payer: Self-pay | Admitting: Family Medicine

## 2023-06-15 ENCOUNTER — Other Ambulatory Visit: Payer: Self-pay | Admitting: Cardiology

## 2023-06-15 ENCOUNTER — Other Ambulatory Visit: Payer: Self-pay | Admitting: Family Medicine

## 2023-06-15 ENCOUNTER — Encounter (INDEPENDENT_AMBULATORY_CARE_PROVIDER_SITE_OTHER): Payer: Self-pay

## 2023-06-16 ENCOUNTER — Encounter: Payer: Self-pay | Admitting: Gastroenterology

## 2023-07-02 ENCOUNTER — Other Ambulatory Visit: Payer: Self-pay | Admitting: Cardiology

## 2023-07-10 NOTE — Assessment & Plan Note (Signed)
Increase leafy greens, consider increased lean red meat and using cast iron cookware. Continue to monitor, report any concerns 

## 2023-07-10 NOTE — Assessment & Plan Note (Signed)
Maintain Lantus usually using 15 to 25 units daily and only up to 30 units if he has a piece of cake no changes in therapy.

## 2023-07-10 NOTE — Assessment & Plan Note (Signed)
Encourage heart healthy diet such as MIND or DASH diet, increase exercise, avoid trans fats, simple carbohydrates and processed foods, consider a krill or fish or flaxseed oil cap daily. Tolerating Atorvastatin 

## 2023-07-10 NOTE — Assessment & Plan Note (Signed)
Supplement and monitor 

## 2023-07-10 NOTE — Assessment & Plan Note (Signed)
Well controlled, no changes to meds. Encouraged heart healthy diet such as the DASH diet and exercise as tolerated.  °

## 2023-07-10 NOTE — Assessment & Plan Note (Signed)
hgba1c acceptable, minimize simple carbs. Increase exercise as tolerated. Continue current meds. Uses gabapentin and topical creams

## 2023-07-11 ENCOUNTER — Ambulatory Visit: Payer: Federal, State, Local not specified - PPO | Admitting: Family Medicine

## 2023-07-11 ENCOUNTER — Encounter: Payer: Self-pay | Admitting: Family Medicine

## 2023-07-11 VITALS — BP 124/74 | HR 70 | Temp 98.0°F | Resp 16 | Ht 71.0 in | Wt 187.8 lb

## 2023-07-11 DIAGNOSIS — I10 Essential (primary) hypertension: Secondary | ICD-10-CM | POA: Diagnosis not present

## 2023-07-11 DIAGNOSIS — E1142 Type 2 diabetes mellitus with diabetic polyneuropathy: Secondary | ICD-10-CM

## 2023-07-11 DIAGNOSIS — E782 Mixed hyperlipidemia: Secondary | ICD-10-CM

## 2023-07-11 DIAGNOSIS — E1169 Type 2 diabetes mellitus with other specified complication: Secondary | ICD-10-CM

## 2023-07-11 DIAGNOSIS — E669 Obesity, unspecified: Secondary | ICD-10-CM | POA: Diagnosis not present

## 2023-07-11 DIAGNOSIS — Z794 Long term (current) use of insulin: Secondary | ICD-10-CM

## 2023-07-11 DIAGNOSIS — G4700 Insomnia, unspecified: Secondary | ICD-10-CM

## 2023-07-11 DIAGNOSIS — D649 Anemia, unspecified: Secondary | ICD-10-CM

## 2023-07-11 LAB — CBC WITH DIFFERENTIAL/PLATELET
Basophils Absolute: 0.1 10*3/uL (ref 0.0–0.1)
Basophils Relative: 0.8 % (ref 0.0–3.0)
Eosinophils Absolute: 0.1 10*3/uL (ref 0.0–0.7)
Eosinophils Relative: 1.2 % (ref 0.0–5.0)
HCT: 35.1 % — ABNORMAL LOW (ref 39.0–52.0)
Hemoglobin: 11.5 g/dL — ABNORMAL LOW (ref 13.0–17.0)
Lymphocytes Relative: 30.9 % (ref 12.0–46.0)
Lymphs Abs: 2.8 10*3/uL (ref 0.7–4.0)
MCHC: 32.8 g/dL (ref 30.0–36.0)
MCV: 93.8 fl (ref 78.0–100.0)
Monocytes Absolute: 0.6 10*3/uL (ref 0.1–1.0)
Monocytes Relative: 7 % (ref 3.0–12.0)
Neutro Abs: 5.5 10*3/uL (ref 1.4–7.7)
Neutrophils Relative %: 60.1 % (ref 43.0–77.0)
Platelets: 415 10*3/uL — ABNORMAL HIGH (ref 150.0–400.0)
RBC: 3.74 Mil/uL — ABNORMAL LOW (ref 4.22–5.81)
RDW: 14.7 % (ref 11.5–15.5)
WBC: 9.1 10*3/uL (ref 4.0–10.5)

## 2023-07-11 LAB — MAGNESIUM: Magnesium: 1.4 mg/dL — ABNORMAL LOW (ref 1.5–2.5)

## 2023-07-11 LAB — LIPID PANEL
Cholesterol: 105 mg/dL (ref 0–200)
HDL: 30.7 mg/dL — ABNORMAL LOW (ref >39.00)
LDL Cholesterol: 51 mg/dL (ref 0–99)
NonHDL: 74.45
Total CHOL/HDL Ratio: 3
Triglycerides: 117 mg/dL (ref 0.0–149.0)
VLDL: 23.4 mg/dL (ref 0.0–40.0)

## 2023-07-11 LAB — HEMOGLOBIN A1C: Hgb A1c MFr Bld: 9 % — ABNORMAL HIGH (ref 4.6–6.5)

## 2023-07-11 LAB — TSH: TSH: 1.18 u[IU]/mL (ref 0.35–5.50)

## 2023-07-11 LAB — MICROALBUMIN / CREATININE URINE RATIO
Creatinine,U: 128.3 mg/dL
Microalb Creat Ratio: 42.4 mg/g — ABNORMAL HIGH (ref 0.0–30.0)
Microalb, Ur: 54.4 mg/dL — ABNORMAL HIGH (ref 0.0–1.9)

## 2023-07-11 NOTE — Patient Instructions (Addendum)
Magnesium Glycinate 200-400 mg at bedtime  Melatonin 5 mg at bedtime if insomnia persists  Encouraged good sleep hygiene such as dark, quiet room. No blue/green glowing lights such as computer screens in bedroom. No alcohol or stimulants in evening. Cut down on caffeine as able. Regular exercise is helpful but not just prior to bed time.      Add Benefiber powder or Metamucil daily and a second probiotic such as the NOW probiotic Diarrhea, Adult Diarrhea is frequent loose and sometimes watery bowel movements. Diarrhea can make you feel weak and cause you to become dehydrated. Dehydration is a condition in which there is not enough water or other fluids in the body. Dehydration can make you tired and thirsty, cause you to have a dry mouth, and decrease how often you urinate. Diarrhea typically lasts 2-3 days. However, it can last longer if it is a sign of something more serious. It is important to treat your diarrhea as told by your health care provider. Follow these instructions at home: Eating and drinking     Follow these recommendations as told by your health care provider: Take an oral rehydration solution (ORS). This is an over-the-counter medicine that helps return your body to its normal balance of nutrients and water. It is found at pharmacies and retail stores. Drink enough fluid to keep your urine pale yellow. Drink fluids such as water, diluted fruit juice, and low-calorie sports drinks. You can drink milk also, if desired. Sucking on ice chips is another way to get fluids. Avoid drinking fluids that contain a lot of sugar or caffeine, such as soda, energy drinks, and regular sports drinks. Avoid alcohol. Eat bland, easy-to-digest foods in small amounts as you are able. These foods include bananas, applesauce, rice, lean meats, toast, and crackers. Avoid spicy or fatty foods.  Medicines Take over-the-counter and prescription medicines only as told by your health care  provider. If you were prescribed antibiotics, take them as told by your health care provider. Do not stop using the antibiotic even if you start to feel better. General instructions  Wash your hands often using soap and water for at least 20 seconds. If soap and water are not available, use hand sanitizer. Others in the household should wash their hands as well. Hands should be washed: After using the toilet or changing a diaper. Before preparing, cooking, or serving food. While caring for a sick person or while visiting someone in a hospital. Rest at home while you recover. Take a warm bath to relieve any burning or pain from frequent diarrhea episodes. Watch your condition for any changes. Contact a health care provider if: You have a fever. Your diarrhea gets worse. You have new symptoms. You vomit every time you eat or drink. You feel light-headed, dizzy, or have a headache. You have muscle cramps. You have signs of dehydration, such as: Dark urine, very little urine, or no urine. Cracked lips. Dry mouth. Sunken eyes. Sleepiness. Weakness. You have bloody or black stools or stools that look like tar. You have severe pain, cramping, or bloating in your abdomen. Your skin feels cold and clammy. You feel confused. Get help right away if: You have chest pain or your heart is beating very quickly. You have trouble breathing or you are breathing very quickly. You feel extremely weak or you faint. These symptoms may be an emergency. Get help right away. Call 911. Do not wait to see if the symptoms will go away. Do not drive yourself  to the hospital. This information is not intended to replace advice given to you by your health care provider. Make sure you discuss any questions you have with your health care provider. Document Revised: 04/20/2022 Document Reviewed: 04/20/2022 Elsevier Patient Education  2024 [employer].

## 2023-07-11 NOTE — Assessment & Plan Note (Signed)
Encouraged good sleep hygiene such as dark, quiet room. No blue/green glowing lights such as computer screens in bedroom. No alcohol or stimulants in evening. Cut down on caffeine as able. Regular exercise is helpful but not just prior to bed time.  Start Magnesium Glycinate 200-400 mg

## 2023-07-11 NOTE — Progress Notes (Signed)
Subjective:    Patient ID: Gene [name], male    DOB: [birth year], 67 y.o.   MRN: [id number]  Chief Complaint  Patient presents with  . Follow-up    Follow up    HPI Discussed the use of AI scribe software for clinical note transcription with the patient, who gave verbal consent to proceed.  History of Present Illness   The patient, with a past medical history of diabetes, presents with a complaint of persistent diarrhea that started at the beginning of August. The patient describes the diarrhea as watery, with up to six episodes per day. The patient has been self-medicating with Pepto Bismol, which has provided some relief. The patient reports that the diarrhea has been improving and is now 80% better.  The patient also reports experiencing insomnia, which started around the same time as the diarrhea. The patient describes difficulty falling asleep and frequent awakenings during the night. The patient has been using over-the-counter sleep aids, including Tylenol PM, with limited success. The patient also reports taking magnesium oxide at night to help with sleep.  The patient has a history of diabetes and has been monitoring their blood sugar levels. The patient reports a recent blood sugar level of 262 after eating dinner and a morning level of 78. The patient also reports a previous episode of hypoglycemia with a blood sugar level of 48. The patient is currently taking Lantus insulin, with a dosage of 15 to 20 units depending on their dietary intake.        Past Medical History:  Diagnosis Date  . Abnormal stress test 06/01/2018   Ischemia involving LAD territory  . Alcohol abuse, episodic 08/07/2008   Qualifier: Diagnosis of  By: Andrey Campanile MD, Raliegh Ip    . Anemia   . Angina pectoris (HCC)   . Arthritis   . CAD (coronary artery disease)   . Chronic pain in right foot   . COLONIC POLYPS, HX OF 08/07/2008   Qualifier: Diagnosis of  By: Kathy Breach    . Coronary artery  disease 08/07/2008   Qualifier: Diagnosis of  By: Kathy Breach    . Diabetes (HCC)   . Diabetes mellitus type 2 in obese 08/07/2008   Qualifier: Diagnosis of  By: Kathy Breach     . Diabetes mellitus type II   . Diabetic neuropathy (HCC)   . Diarrhea 03/05/2021  . Dog bite of calf, right, initial encounter 12/25/2018  . Elevated PSA 05/03/2014  . Essential hypertension 08/07/2008   Qualifier: Diagnosis of  By: Kathy Breach    . FOOT PAIN, RIGHT 09/10/2008   Qualifier: Diagnosis of  By: Andrey Campanile MD, Raliegh Ip    . Gangrene of toe of left foot (HCC) 09/03/2021  . GERD 08/07/2008   Qualifier: Diagnosis of  By: Kathy Breach    . GERD (gastroesophageal reflux disease)   . H/O tobacco use, presenting hazards to health 05/21/2009   Qualifier: Diagnosis of  By: Nena Jordan  Last cigarette smoked May 30, 2015    . History of colonic polyps   . History of diabetic ulcer of foot 05/05/2022  . History of revascularization procedure of lower extremity 09/14/2021  . Hx of CABG 2000  . Hyperlipidemia    on meds  . Hyperlipidemia, mixed 08/07/2008   Qualifier: Diagnosis of  By: Kathy Breach    . Hypertension    on meds  . Hypomagnesemia   . Insomnia 03/25/2013  .  Left knee pain 11/03/2015  . Myocardial infarction (HCC) 2006  . Neuromuscular disorder (HCC)    NEUROPATHY  . Obesity 06/24/2016  . Peripheral neuropathy 09/10/2008   Qualifier: Diagnosis of  By: Andrey Campanile MD, Raliegh Ip    . PERIPHERAL VASCULAR DISEASE 08/07/2008   Qualifier: Diagnosis of  By: Kathy Breach    . Plantar fasciitis   . Preventative health care 05/03/2014  . Psoriasis 05/03/2014  . PVD (peripheral vascular disease) (HCC)    PTA RLE 12/08 [place]  . Skin ulcer of left great toe (HCC) 10/20/2021  . SOB (shortness of breath) on exertion 05/02/2018  . Status post coronary artery bypass graft 05/11/2018   2000  . TOBACCO USER 05/21/2009   Qualifier: Diagnosis of  By: Nena Jordan  Last cigarette smoked May 30, 2015      Past Surgical History:  Procedure Laterality Date  . CORONARY ARTERY BYPASS GRAFT  2000   200 SVG to diagonal, LIMA to LAD, SVG to LCX  . LEFT HEART CATH AND CORS/GRAFTS ANGIOGRAPHY N/A 06/08/2018   Procedure: LEFT HEART CATH AND CORS/GRAFTS ANGIOGRAPHY;  Surgeon: Lyn Records, MD;  Location: [facility];  Service: Cardiovascular;  Laterality: N/A;  . REVISION TOTAL KNEE ARTHROPLASTY Right 2009  . TONSILLECTOMY AND ADENOIDECTOMY    . VASECTOMY    . WISDOM TOOTH EXTRACTION      Family History  Problem Relation Age of Onset  . Coronary artery disease Mother   . Hypertension Mother   . Hyperlipidemia Father   . Aneurysm Father        brain  . Prostate cancer Brother   . Cancer Maternal Uncle   . Hyperlipidemia Other   . Hypertension Other   . Stroke Other   . Colon cancer Neg Hx   . Esophageal cancer Neg Hx   . Liver cancer Neg Hx   . Pancreatic cancer Neg Hx   . Rectal cancer Neg Hx   . Stomach cancer Neg Hx   . Colon polyps Neg Hx     Social History   Socioeconomic History  . Marital status: Married    Spouse name: Not on file  . Number of children: Not on file  . Years of education: Not on file  . Highest education level: Not on file  Occupational History  . Not on file  Tobacco Use  . Smoking status: Former    Current packs/day: 0.00    Average packs/day: 0.5 packs/day for 40.0 years (20.0 ttl pk-yrs)    Types: Cigarettes    Start date: 05/04/1975    Quit date: 05/04/2015    Years since quitting: 8.1  . Smokeless tobacco: Never  Vaping Use  . Vaping status: Never Used  Substance and Sexual Activity  . Alcohol use: No    Alcohol/week: 0.0 standard drinks of alcohol  . Drug use: No    Comment: rare  . Sexual activity: Yes    Comment: lives with wife, retired   Other Topics Concern  . Not on file  Social History Narrative   Occupation: grocery clerk   Second marriage - 3 children from first  marriage   Tobacco Use - Yes.     Alcohol Use - yes(rare)           Social Determinants of Health   Financial Resource Strain: Not on file  Food Insecurity: Not on file  Transportation Needs: Not on file  Physical Activity: Not on file  Stress: Not on file  Social Connections: Unknown (03/30/2022)   Received from [facility]   Social Network   . Social Network: Not on file  Intimate Partner Violence: Unknown (02/18/2022)   [facility]   HITS   . Physically Hurt: Not on file   . Insult or Talk Down To: Not on file   . Threaten Physical Harm: Not on file   . Scream or Curse: Not on file    Outpatient Medications Prior to Visit  Medication Sig Dispense Refill  . Alpha-D-Galactosidase (ANTI-GAS PO) Take 1 tablet by mouth daily. Unknown strenght    . amLODipine (NORVASC) 10 MG tablet Take 1 tablet (10 mg total) by mouth daily. 90 tablet 0  . aspirin 81 MG EC tablet Take 81 mg by mouth daily.    Marland Kitchen atorvastatin (LIPITOR) 40 MG tablet Take 1 tablet by mouth once daily 90 tablet 2  . clopidogrel (PLAVIX) 75 MG tablet Take 1 tablet (75 mg total) by mouth daily. 90 tablet 0  . fish oil-omega-3 fatty acids 1000 MG capsule Take 1 g by mouth at bedtime.    . gabapentin (NEURONTIN) 600 MG tablet TAKE 1 TABLET BY MOUTH TWICE DAILY AND 1 AS NEEDED FOR SEVERE PAIN . DO NOT EXCEED 3 PER 24 HOURS 270 tablet 0  . glimepiride (AMARYL) 4 MG tablet TAKE 1 TABLET BY MOUTH IN THE MORNING AND AT BEDTIME 180 tablet 0  . insulin glargine (LANTUS SOLOSTAR) 100 UNIT/ML Solostar Pen Inject 15-30 Units into the skin at bedtime. 30 mL 5  . labetalol (NORMODYNE) 200 MG tablet Take 1 tablet by mouth twice daily 180 tablet 0  . lisinopril (ZESTRIL) 20 MG tablet Take 1 tablet by mouth once daily 90 tablet 0  . MAGNESIUM EXTRA STRENGTH 400 MG CAPS TAKE 1 CAPSULE BY MOUTH ONCE DAILY (Patient taking differently: Take 1 capsule by mouth daily.) 30 capsule 3  . metFORMIN (GLUCOPHAGE-XR) 500 MG 24 hr  tablet Take 2 tablets (1,000 mg total) by mouth 2 (two) times daily. 360 tablet 0  . Multiple Vitamins-Minerals (CENTRUM PO) Take 1 tablet by mouth daily. Unknown strenght    . nitroGLYCERIN (NITROSTAT) 0.4 MG SL tablet Place 0.4 mg under the tongue every 5 (five) minutes as needed for chest pain.    Marland Kitchen omeprazole (PRILOSEC) 40 MG capsule Take 1 capsule (40 mg total) by mouth daily. 90 capsule 1  . ranolazine (RANEXA) 500 MG 12 hr tablet Take 1 tablet by mouth twice daily 180 tablet 1  . Specialty Vitamins Products (MAGNESIUM, AMINO ACID CHELATE,) 133 MG tablet Take 1 tablet by mouth daily. Unknown strenght    . tiZANidine (ZANAFLEX) 2 MG tablet TKAE 1/2 TO 2 TABLETS BY MOUTH EVERY 6 HOURS AS NEEDED FOR MUSCLE SPASMS 20 tablet 0  . VITAMIN D, CHOLECALCIFEROL, PO Take 1 tablet by mouth daily. Unknown strenght     No facility-administered medications prior to visit.    No Known Allergies  Review of Systems  Constitutional:  Negative for fever and malaise/fatigue.  HENT:  Negative for congestion.   Eyes:  Negative for blurred vision.  Respiratory:  Negative for shortness of breath.   Cardiovascular:  Negative for chest pain, palpitations and leg swelling.  Gastrointestinal:  Positive for diarrhea. Negative for abdominal pain, blood in stool and nausea.  Genitourinary:  Negative for dysuria and frequency.  Musculoskeletal:  Negative for falls.  Skin:  Negative for rash.  Neurological:  Negative for dizziness, loss of  consciousness and headaches.  Endo/Heme/Allergies:  Negative for environmental allergies.  Psychiatric/Behavioral:  Negative for depression. The patient has insomnia. The patient is not nervous/anxious.       Objective:    Physical Exam Vitals reviewed.  Constitutional:      Appearance: Normal appearance. He is not ill-appearing.  HENT:     Head: Normocephalic and atraumatic.     Nose: Nose normal.  Eyes:     Conjunctiva/sclera: Conjunctivae normal.  Cardiovascular:      Rate and Rhythm: Normal rate.     Pulses: Normal pulses.     Heart sounds: Normal heart sounds. No murmur heard. Pulmonary:     Effort: Pulmonary effort is normal.     Breath sounds: Normal breath sounds. No wheezing.  Abdominal:     Palpations: Abdomen is soft. There is no mass.     Tenderness: There is no abdominal tenderness.  Musculoskeletal:     Cervical back: Normal range of motion.     Right lower leg: No edema.     Left lower leg: No edema.  Skin:    General: Skin is warm and dry.  Neurological:     General: No focal deficit present.     Mental Status: He is alert and oriented to person, place, and time.  Psychiatric:        Mood and Affect: Mood normal.   BP 124/74 (BP Location: Left Arm, Patient Position: Sitting, Cuff Size: Normal)   Pulse 70   Temp 98 F (36.7 C) (Oral)   Resp 16   Ht 5\' 11"  (1.803 m)   Wt 187 lb 12.8 oz (85.2 kg)   SpO2 97%   BMI 26.19 kg/m  Wt Readings from Last 3 Encounters:  07/11/23 187 lb 12.8 oz (85.2 kg)  03/07/23 186 lb 6.4 oz (84.6 kg)  01/19/23 186 lb (84.4 kg)    Diabetic Foot Exam - Simple   No data filed    Lab Results  Component Value Date   WBC 9.1 07/11/2023   HGB 11.5 (L) 07/11/2023   HCT 35.1 (L) 07/11/2023   PLT 415.0 (H) 07/11/2023   GLUCOSE 207 (H) 03/07/2023   CHOL 105 07/11/2023   TRIG 117.0 07/11/2023   HDL 30.70 (L) 07/11/2023   LDLCALC 51 07/11/2023   ALT 11 03/07/2023   AST 15 03/07/2023   NA 136 03/07/2023   K 4.8 03/07/2023   CL 99 03/07/2023   CREATININE 1.06 03/07/2023   BUN 18 03/07/2023   CO2 28 03/07/2023   TSH 1.18 07/11/2023   PSA 10.42 (H) 10/20/2021   INR 1.1 07/31/2008   HGBA1C 9.0 (H) 07/11/2023   MICROALBUR 54.4 (H) 07/11/2023    Lab Results  Component Value Date   TSH 1.18 07/11/2023   Lab Results  Component Value Date   WBC 9.1 07/11/2023   HGB 11.5 (L) 07/11/2023   HCT 35.1 (L) 07/11/2023   MCV 93.8 07/11/2023   PLT 415.0 (H) 07/11/2023   Lab Results  Component  Value Date   NA 136 03/07/2023   K 4.8 03/07/2023   CO2 28 03/07/2023   GLUCOSE 207 (H) 03/07/2023   BUN 18 03/07/2023   CREATININE 1.06 03/07/2023   BILITOT 0.4 03/07/2023   ALKPHOS 86 03/07/2023   AST 15 03/07/2023   ALT 11 03/07/2023   PROT 6.6 03/07/2023   ALBUMIN 4.0 03/07/2023   CALCIUM 9.3 03/07/2023   ANIONGAP 14 06/08/2018   GFR 72.30 03/07/2023   Lab Results  Component Value Date   CHOL 105 07/11/2023   Lab Results  Component Value Date   HDL 30.70 (L) 07/11/2023   Lab Results  Component Value Date   LDLCALC 51 07/11/2023   Lab Results  Component Value Date   TRIG 117.0 07/11/2023   Lab Results  Component Value Date   CHOLHDL 3 07/11/2023   Lab Results  Component Value Date   HGBA1C 9.0 (H) 07/11/2023       Assessment & Plan:  Diabetic polyneuropathy associated with type 2 diabetes mellitus (HCC) Assessment & Plan: hgba1c acceptable, minimize simple carbs. Increase exercise as tolerated. Continue current meds. Uses gabapentin and topical creams    Essential hypertension Assessment & Plan: Well controlled, no changes to meds. Encouraged heart healthy diet such as the DASH diet and exercise as tolerated.   Orders: -     CBC with Differential/Platelet -     TSH  Hyperlipidemia, mixed Assessment & Plan: Encourage heart healthy diet such as MIND or DASH diet, increase exercise, avoid trans fats, simple carbohydrates and processed foods, consider a krill or fish or flaxseed oil cap daily. Tolerating Atorvastatin  Orders: -     Lipid panel  Hypomagnesemia Assessment & Plan: Supplement and monitor   Orders: -     Magnesium  Type 2 diabetes mellitus with obesity (HCC) Assessment & Plan:  Maintain Lantus usually using 15 to 25 units daily and only up to 30 units if he has a piece of cake no changes in therapy.   Orders: -     Hemoglobin A1c -     TSH -     Microalbumin / creatinine urine ratio -     COMPLETE METABOLIC PANEL WITH  eGFR  Anemia, unspecified type Assessment & Plan: Increase leafy greens, consider increased lean red meat and using cast iron cookware. Continue to monitor, report any concerns    Insomnia, unspecified type Assessment & Plan: Encouraged good sleep hygiene such as dark, quiet room. No blue/green glowing lights such as computer screens in bedroom. No alcohol or stimulants in evening. Cut down on caffeine as able. Regular exercise is helpful but not just prior to bed time.  Start Magnesium Glycinate 200-400 mg     Assessment and Plan    Diarrhea Started in early August, no associated nausea, vomiting, fevers, or abdominal pain. Initially severe, now 80% improved. Patient has been self-treating with Pepto Bismol. -Continue Pepto Bismol as needed. -Consider adding a fiber supplement such as Benefiber or Metamucil to provide bulk to the stool. -Continue current probiotic and consider alternating with Now probiotic for gut health.  Insomnia New onset since the start of diarrhea, likely due to nocturnal awakenings. Patient has been using various sleep aids with limited success. -Consider adding magnesium glycinate at night if sleep does not continue to improve. -Consider adding melatonin 5mg  at bedtime if insomnia persists.  Type 2 Diabetes Mellitus Patient reported a postprandial glucose of 262, which is higher than desired. However, patient also reported episodes of hypoglycemia with glucose as low as 48. -Continue current regimen of Lantus insulin 15-20 units and glimepiride. -Encourage consistent carbohydrate intake and increased protein intake to prevent hypoglycemia. -Consider switching to a peanut butter with less added sugar to help control blood sugars. -Consider continuous glucose monitor if patient changes mind.  General Health Maintenance -Declined flu shot today, consider in the future. -Order routine blood work. -Follow-up in 4-5 months for a longer check-up visit.  Danise Edge, MD

## 2023-07-12 ENCOUNTER — Telehealth: Payer: Self-pay

## 2023-07-12 LAB — COMPLETE METABOLIC PANEL WITH GFR
AG Ratio: 1.7 (calc) (ref 1.0–2.5)
ALT: 12 U/L (ref 9–46)
AST: 13 U/L (ref 10–35)
Albumin: 4.3 g/dL (ref 3.6–5.1)
Alkaline phosphatase (APISO): 86 U/L (ref 35–144)
BUN/Creatinine Ratio: 22 (calc) (ref 6–22)
BUN: 27 mg/dL — ABNORMAL HIGH (ref 7–25)
CO2: 27 mmol/L (ref 20–32)
Calcium: 9.7 mg/dL (ref 8.6–10.3)
Chloride: 102 mmol/L (ref 98–110)
Creat: 1.25 mg/dL (ref 0.70–1.35)
Globulin: 2.5 g/dL (ref 1.9–3.7)
Glucose, Bld: 149 mg/dL — ABNORMAL HIGH (ref 65–99)
Potassium: 5.3 mmol/L (ref 3.5–5.3)
Sodium: 139 mmol/L (ref 135–146)
Total Bilirubin: 0.3 mg/dL (ref 0.2–1.2)
Total Protein: 6.8 g/dL (ref 6.1–8.1)
eGFR: 63 mL/min/{1.73_m2} (ref >=60)

## 2023-07-12 NOTE — Telephone Encounter (Signed)
Sent message

## 2023-07-15 ENCOUNTER — Other Ambulatory Visit: Payer: Self-pay | Admitting: Family Medicine

## 2023-07-21 ENCOUNTER — Other Ambulatory Visit: Payer: Self-pay | Admitting: Family Medicine

## 2023-07-21 DIAGNOSIS — R52 Pain, unspecified: Secondary | ICD-10-CM

## 2023-07-25 ENCOUNTER — Telehealth: Payer: Self-pay

## 2023-07-25 ENCOUNTER — Other Ambulatory Visit: Payer: Self-pay | Admitting: Family Medicine

## 2023-07-25 NOTE — Telephone Encounter (Signed)
Called to ask patient if he is currently on PLAVIX; Unable to speak with patient; left message for patient to call back to the office to provide some information; Will follow up with this message and/or patient at a later date/time;

## 2023-07-26 ENCOUNTER — Telehealth: Payer: Self-pay | Admitting: *Deleted

## 2023-07-26 NOTE — Telephone Encounter (Signed)
Attempt to reach pt to verify if on blood thinner. LM with call back # and instructions to call.

## 2023-07-28 ENCOUNTER — Telehealth: Payer: Self-pay | Admitting: *Deleted

## 2023-07-28 NOTE — Telephone Encounter (Signed)
Attempt to reach pt for pre-visit. LM with call back #. Will attempt to reach again in due to no other # listed in profile

## 2023-07-28 NOTE — Telephone Encounter (Signed)
Spoke with pt after second attempt. OV made for 10/20/23 @ 8:30 am with PA instructed to come early to OV. Pt stated he will.

## 2023-08-16 ENCOUNTER — Encounter: Payer: Federal, State, Local not specified - PPO | Admitting: Gastroenterology

## 2023-08-19 ENCOUNTER — Other Ambulatory Visit: Payer: Self-pay | Admitting: Family Medicine

## 2023-09-02 ENCOUNTER — Other Ambulatory Visit: Payer: Self-pay | Admitting: Family Medicine

## 2023-09-15 ENCOUNTER — Other Ambulatory Visit: Payer: Self-pay | Admitting: Family Medicine

## 2023-09-29 ENCOUNTER — Telehealth: Payer: Self-pay | Admitting: Family Medicine

## 2023-09-29 ENCOUNTER — Encounter: Payer: Self-pay | Admitting: Cardiology

## 2023-09-29 ENCOUNTER — Ambulatory Visit: Payer: Federal, State, Local not specified - PPO | Attending: Cardiology | Admitting: Cardiology

## 2023-09-29 VITALS — BP 140/76 | HR 74 | Ht 72.0 in | Wt 200.0 lb

## 2023-09-29 DIAGNOSIS — I779 Disorder of arteries and arterioles, unspecified: Secondary | ICD-10-CM

## 2023-09-29 DIAGNOSIS — I25118 Atherosclerotic heart disease of native coronary artery with other forms of angina pectoris: Secondary | ICD-10-CM | POA: Diagnosis not present

## 2023-09-29 DIAGNOSIS — E782 Mixed hyperlipidemia: Secondary | ICD-10-CM

## 2023-09-29 DIAGNOSIS — I10 Essential (primary) hypertension: Secondary | ICD-10-CM | POA: Diagnosis not present

## 2023-09-29 NOTE — Telephone Encounter (Signed)
Pt came in office stating provider wanted him to get labs for Magnesium (no orders on file) pt mentioned that from his last test that provider wanted him to get it done (last lab done on 07-08-2023) Please advise pt, if orders gets put in so pt can schedule a lab appt.  Pt tel [phone]

## 2023-09-29 NOTE — Patient Instructions (Signed)

## 2023-09-29 NOTE — Progress Notes (Signed)
Cardiology Office Note:    Date:  09/29/2023   ID:  Reginia Forts, DOB [birth year], MRN [id number]  PCP:  Bradd Canary, MD  Cardiologist:  Gypsy Balsam, MD    Referring MD: Bradd Canary, MD   Chief Complaint  Patient presents with   Follow-up    History of Present Illness:    Gene [name] is a 67 y.o. male past medical history significant for coronary artery disease status post coronary bypass graft done many years ago last evaluation was in 2019 Cardiac catheterization has been done.  There were no target lesions for intervention, additional problem include diabetes mellitus which is poorly controlled, essential hypertension, dyslipidemia.  He comes today to months for follow-up he is not in a good mood today he looks sad he said he have to go back to work he complained about his ex-wife and Kerrin Champagne that he have to pay.  Also fell down couple weeks ago about a month ago carrying garbage he fell on the right knee he did have knee surgery done years ago.  Still have some pain but overall doing better.  Denies have any cardiac complaints.  No chest pain tightness squeezing pressure burning chest  Past Medical History:  Diagnosis Date   Abnormal stress test 06/01/2018   Ischemia involving LAD territory   Alcohol abuse, episodic 08/07/2008   Qualifier: Diagnosis of  By: Andrey Campanile MD, LauraLee     Anemia    Angina pectoris (HCC)    Arthritis    CAD (coronary artery disease)    Chronic pain in right foot    COLONIC POLYPS, HX OF 08/07/2008   Qualifier: Diagnosis of  By: Kathy Breach     Coronary artery disease 08/07/2008   Qualifier: Diagnosis of  By: Kathy Breach     Diabetes [facility])    Diabetes mellitus type 2 in obese 08/07/2008   Qualifier: Diagnosis of  By: Ardell Isaacs RMA, Lisa      Diabetes mellitus type II    Diabetic neuropathy (HCC)    Diarrhea 03/05/2021   Dog bite of calf, right, initial encounter 12/25/2018   Elevated PSA 05/03/2014   Essential  hypertension 08/07/2008   Qualifier: Diagnosis of  By: Ardell Isaacs RMA, Lisa     FOOT PAIN, RIGHT 09/10/2008   Qualifier: Diagnosis of  By: Andrey Campanile MD, LauraLee     Gangrene of toe of left foot (HCC) 09/03/2021   GERD 08/07/2008   Qualifier: Diagnosis of  By: Kathy Breach     GERD (gastroesophageal reflux disease)    H/O tobacco use, presenting hazards to health 05/21/2009   Qualifier: Diagnosis of  By: Nena Jordan  Last cigarette smoked May 30, 2015     History of colonic polyps    History of diabetic ulcer of foot 05/05/2022   History of revascularization procedure of lower extremity 09/14/2021   Hx of CABG 2000   Hyperlipidemia    on meds   Hyperlipidemia, mixed 08/07/2008   Qualifier: Diagnosis of  By: Ardell Isaacs RMA, Lisa     Hypertension    on meds   Hypomagnesemia    Insomnia 03/25/2013   Left knee pain 11/03/2015   Myocardial infarction [facility]) 2006   Neuromuscular disorder (HCC)    NEUROPATHY   Obesity 06/24/2016   Peripheral neuropathy 09/10/2008   Qualifier: Diagnosis of  By: Andrey Campanile MD, LauraLee     PERIPHERAL VASCULAR DISEASE 08/07/2008   Qualifier: Diagnosis of  By: Ardell Isaacs RMA,  Lisa     Plantar fasciitis    Preventative health care 05/03/2014   Psoriasis 05/03/2014   PVD (peripheral vascular disease) (HCC)    [place]A RLE 12/08 [place]   Skin ulcer of left great toe (HCC) 10/20/2021   SOB (shortness of breath) on exertion 05/02/2018   Status post coronary artery bypass graft 05/11/2018   2000   TOBACCO USER 05/21/2009   Qualifier: Diagnosis of  By: Nena Jordan  Last cigarette smoked May 30, 2015      Past Surgical History:  Procedure Laterality Date   CORONARY ARTERY BYPASS GRAFT  2000   200 SVG to diagonal, LIMA to LAD, SVG to LCX   LEFT HEART CATH AND CORS/GRAFTS ANGIOGRAPHY N/A 06/08/2018   Procedure: LEFT HEART CATH AND CORS/GRAFTS ANGIOGRAPHY;  Surgeon: Lyn Records, MD;  Location: [facility] INV[facility]IVE [facility];  Service: Cardiovascular;   Laterality: N/A;   REVISION TOTAL KNEE ARTHROPLASTY Right 2009   TONSILLECTOMY AND ADENOIDECTOMY     VASECTOMY     WISDOM TOOTH EXTRACTION      Current Medications: Current Meds  Medication Sig   Alpha-D-Galactosidase (ANTI-GAS PO) Take 1 tablet by mouth daily. Unknown strenght   amLODipine (NORVASC) 10 MG tablet Take 1 tablet (10 mg total) by mouth daily.   aspirin 81 MG EC tablet Take 81 mg by mouth daily.   atorvastatin (LIPITOR) 40 MG tablet Take 1 tablet by mouth once daily (Patient taking differently: Take 40 mg by mouth daily. TAKE 1 TABLET BY MOUTH ONCE DAILY .)   clopidogrel (PLAVIX) 75 MG tablet Take 1 tablet by mouth once daily   fish oil-omega-3 fatty acids 1000 MG capsule Take 1 g by mouth at bedtime.   gabapentin (NEURONTIN) 600 MG tablet TAKE 1 TABLET BY MOUTH TWICE DAILY AND 1 AS NEEDED FOR  SEVERE  PAIN.  DO  NOT  EXCEED  3  PER  24  HOURS (Patient taking differently: Take 600 mg by mouth See admin instructions. 1 tablet twice a day and 1 as needed fpr severe pain. Do not exceed 3 tablets in 24 hours)   glimepiride (AMARYL) 4 MG tablet TAKE 1 TABLET BY MOUTH IN THE MORNING AND AT BEDTIME (Patient taking differently: Take 4 mg by mouth 2 (two) times daily.)   labetalol (NORMODYNE) 200 MG tablet Take 1 tablet by mouth twice daily   LANTUS SOLOSTAR 100 UNIT/ML Solostar Pen INJECT 25 TO 35 UNITS SUBCUTANEOUSLY AT BEDTIME (Patient taking differently: Inject 25-35 Units into the skin daily.)   lisinopril (ZESTRIL) 20 MG tablet Take 1 tablet by mouth once daily   MAGNESIUM EXTRA STRENGTH 400 MG CAPS TAKE 1 CAPSULE BY MOUTH ONCE DAILY (Patient taking differently: Take 1 capsule by mouth daily.)   metFORMIN (GLUCOPHAGE-XR) 500 MG 24 hr tablet Take 2 tablets (1,000 mg total) by mouth 2 (two) times daily.   Multiple Vitamins-Minerals (CENTRUM PO) Take 1 tablet by mouth daily. Unknown strenght   nitroGLYCERIN (NITROSTAT) 0.4 MG SL tablet Place 0.4 mg under the tongue every 5 (five)  minutes as needed for chest pain.   omeprazole (PRILOSEC) 40 MG capsule Take 1 capsule (40 mg total) by mouth daily.   ranolazine (RANEXA) 500 MG 12 hr tablet Take 1 tablet by mouth twice daily   Specialty Vitamins Products (MAGNESIUM, AMINO ACID CHELATE,) 133 MG tablet Take 1 tablet by mouth daily. Unknown strenght   tiZANidine (ZANAFLEX) 2 MG tablet TKAE 1/2 TO 2 TABLETS BY MOUTH EVERY 6  HOURS AS NEEDED FOR MUSCLE SPASMS (Patient taking differently: Take 2 mg by mouth every 6 (six) hours as needed for muscle spasms.)   VITAMIN D, CHOLECALCIFEROL, PO Take 1 tablet by mouth daily. Unknown strenght     Allergies:   Patient has no known allergies.   Social History   Socioeconomic History   Marital status: Married    Spouse name: Not on file   Number of children: Not on file   Years of education: Not on file   Highest education level: Not on file  Occupational History   Not on file  Tobacco Use   Smoking status: Former    Current packs/day: 0.00    Average packs/day: 0.5 packs/day for 40.0 years (20.0 ttl pk-yrs)    Types: Cigarettes    Start date: 05/04/1975    Quit date: 05/04/2015    Years since quitting: 8.4   Smokeless tobacco: Never  Vaping Use   Vaping status: Never Used  Substance and Sexual Activity   Alcohol use: No    Alcohol/week: 0.0 standard drinks of alcohol   Drug use: No    Comment: rare   Sexual activity: Yes    Comment: lives with wife, retired   Other Topics Concern   Not on file  Social History Narrative   Occupation: Licensed conveyancer   Second marriage - 3 children from first marriage   Tobacco Use - Yes.     Alcohol Use - yes(rare)           Social Determinants of Health   Financial Resource Strain: Not on file  Food Insecurity: Not on file  Transportation Needs: Not on file  Physical Activity: Not on file  Stress: Not on file  Social Connections: Unknown (03/30/2022)   Received [name]   Social Network    Social Network:  Not on file     Family History: The patient's family history includes Aneurysm in his father; Cancer in his maternal uncle; Coronary artery disease in his mother; Hyperlipidemia in his father and another family member; Hypertension in his mother and another family member; Prostate cancer in his brother; Stroke in an other family member. There is no history of Colon cancer, Esophageal cancer, Liver cancer, Pancreatic cancer, Rectal cancer, Stomach cancer, or Colon polyps. ROS:   Please see the history of present illness.    All 14 point review of systems negative except as described per history of present illness  EKGs/Labs/Other Studies Reviewed:    EKG Interpretation Date/Time:  Thursday September 29 2023 09:00:13 EST Ventricular Rate:  76 PR Interval:  170 QRS Duration:  96 QT Interval:  396 QTC Calculation: 445 R Axis:   -37  Text Interpretation: Normal sinus rhythm Left axis deviation When compared with ECG of 30-Jul-2008 09:48, QRS axis Shifted left Confirmed by Gypsy Balsam [phone]) on 09/29/2023 9:15:12 AM    Recent Labs: 07/11/2023: ALT 12; BUN 27; Creat 1.25; Hemoglobin 11.5; Magnesium 1.4; Platelets 415.0; Potassium 5.3; Sodium 139; TSH 1.18  Recent Lipid Panel    Component Value Date/Time   CHOL 105 07/11/2023 1134   CHOL 101 02/04/2020 1652   TRIG 117.0 07/11/2023 1134   HDL 30.70 (L) 07/11/2023 1134   HDL 31 (L) 02/04/2020 1652   CHOLHDL 3 07/11/2023 1134   VLDL 23.4 07/11/2023 1134   LDLCALC 51 07/11/2023 1134   LDLCALC 48 02/04/2020 1652    Physical Exam:    VS:  BP (!) 140/76 (BP Location: Left  Arm, Patient Position: Sitting)   Pulse 74   Ht 6' (1.829 m)   Wt 200 lb (90.7 kg)   SpO2 96%   BMI 27.12 kg/m     Wt Readings from Last 3 Encounters:  09/29/23 200 lb (90.7 kg)  07/11/23 187 lb 12.8 oz (85.2 kg)  03/07/23 186 lb 6.4 oz (84.6 kg)     GEN:  Well nourished, well developed in no acute distress HEENT: Normal NECK: No JVD; No carotid  bruits LYMPHATICS: No lymphadenopathy CARDIAC: RRR, no murmurs, no rubs, no gallops RESPIRATORY:  Clear to auscultation without rales, wheezing or rhonchi  ABDOMEN: Soft, non-tender, non-distended MUSCULOSKELETAL:  No edema; No deformity  SKIN: Warm and dry LOWER EXTREMITIES: no swelling NEUROLOGIC:  Alert and oriented x 3 PSYCHIATRIC:  Normal affect   ASSESSMENT:    1. Essential hypertension   2. Coronary artery disease of native artery of native heart with stable angina pectoris (HCC)   3. PAOD (peripheral arterial occlusive disease) (HCC)   4. Hyperlipidemia, mixed    PLAN:    In order of problems listed above:  Coronary disease stable from that point review on appropriate guideline directed medical therapy. Dyslipidemia I did review K PN which show LDL 51 HDL 30.  Will continue present management which includes Lipitor 40. Peripheral vascular disease.  Stable from that point review. Diabetes last hemoglobin A1c 9.0 elevated.  Need to be a lipid better controlled he understand he is trying to work on that.   Medication Adjustments/Labs and Tests Ordered: Current medicines are reviewed at length with the patient today.  Concerns regarding medicines are outlined above.  Orders Placed This Encounter  Procedures   EKG 12-Lead   Medication changes: No orders of the defined types were placed in this encounter.   Signed, Georgeanna Lea, MD, [facility] 09/29/2023 9:36 AM    [facility]

## 2023-09-30 NOTE — Telephone Encounter (Signed)
Called patient and left a voicemail for him to call front desk and they will be able to schedule his lab appointment for his magnesium levels to be checked

## 2023-10-03 NOTE — Telephone Encounter (Signed)
Lab has been ordered. Thank you.

## 2023-10-04 ENCOUNTER — Other Ambulatory Visit: Payer: Self-pay | Admitting: Cardiology

## 2023-10-09 ENCOUNTER — Other Ambulatory Visit: Payer: Self-pay | Admitting: Family Medicine

## 2023-10-12 ENCOUNTER — Other Ambulatory Visit: Payer: Self-pay | Admitting: Family Medicine

## 2023-10-18 ENCOUNTER — Other Ambulatory Visit: Payer: Self-pay | Admitting: Family Medicine

## 2023-10-18 DIAGNOSIS — R52 Pain, unspecified: Secondary | ICD-10-CM

## 2023-10-20 ENCOUNTER — Telehealth: Payer: Self-pay | Admitting: *Deleted

## 2023-10-20 ENCOUNTER — Ambulatory Visit: Payer: Federal, State, Local not specified - PPO | Admitting: Gastroenterology

## 2023-10-20 ENCOUNTER — Encounter: Payer: Self-pay | Admitting: Gastroenterology

## 2023-10-20 VITALS — BP 116/56 | HR 67 | Ht 72.0 in | Wt 156.0 lb

## 2023-10-20 DIAGNOSIS — Z7902 Long term (current) use of antithrombotics/antiplatelets: Secondary | ICD-10-CM

## 2023-10-20 DIAGNOSIS — Z09 Encounter for follow-up examination after completed treatment for conditions other than malignant neoplasm: Secondary | ICD-10-CM | POA: Diagnosis not present

## 2023-10-20 DIAGNOSIS — Z860101 Personal history of adenomatous and serrated colon polyps: Secondary | ICD-10-CM

## 2023-10-20 DIAGNOSIS — Z860100 Personal history of colon polyps, unspecified: Secondary | ICD-10-CM

## 2023-10-20 HISTORY — DX: Long term (current) use of antithrombotics/antiplatelets: Z79.02

## 2023-10-20 MED ORDER — NA SULFATE-K SULFATE-MG SULF 17.5-3.13-1.6 GM/177ML PO SOLN: 1.0000 | Freq: Once | ORAL | 0 refills | Status: AC

## 2023-10-20 NOTE — Patient Instructions (Signed)
You have been scheduled for a colonoscopy. Please follow written instructions given to you at your visit today.   Please pick up your prep supplies at the pharmacy within the next 1-3 days.  If you use inhalers (even only as needed), please bring them with you on the day of your procedure.  DO NOT TAKE 7 DAYS PRIOR TO TEST- Trulicity (dulaglutide) Ozempic, Wegovy (semaglutide) Mounjaro (tirzepatide) Bydureon Bcise (exanatide extended release)  DO NOT TAKE 1 DAY PRIOR TO YOUR TEST Rybelsus (semaglutide) Adlyxin (lixisenatide) Victoza (liraglutide) Byetta (exanatide)  _______________________________________________________  If your blood pressure at your visit was 140/90 or greater, please contact your primary care physician to follow up on this.  _______________________________________________________  If you are age 36 or older, your body mass index should be between 23-30. Your Body mass index is 21.16 kg/m. If this is out of the aforementioned range listed, please consider follow up with your Primary Care Provider.  If you are age 55 or younger, your body mass index should be between 19-25. Your Body mass index is 21.16 kg/m. If this is out of the aformentioned range listed, please consider follow up with your Primary Care Provider.   ________________________________________________________  The  GI providers would like to encourage you to use [facility] to communicate with providers for non-urgent requests or questions.  Due to long hold times on the telephone, sending your provider a message by [facility] may be a faster and more efficient way to get a response.  Please allow 48 business hours for a response.  Please remember that this is for non-urgent requests.  _______________________________________________________

## 2023-10-20 NOTE — Telephone Encounter (Signed)
  Gene [name] 11-24-1955 [id number]  @DATE @   Dear Dr. Abner Greenspan:  We have scheduled the above named patient for a(n) colonoscopy procedure. Our records show that (s)he is on anticoagulation therapy.  Please advise as to whether the patient may come off their therapy of Plavix 5 days prior to their procedure which is scheduled for 11/08/2023.  Please route your response to Cristela Felt, CMA or fax response to [phone].  Sincerely,    [facility]

## 2023-10-20 NOTE — Progress Notes (Signed)
10/20/2023 Gene [name] [id number] 1956/05/24   HISTORY OF PRESENT ILLNESS:  This is a 67 year old male who is a patient of Dr. Ardell Isaacs.  PMH as listed below but includes CAD, PVD on Plavix.  He is here today to discuss and schedule another colonoscopy.  Colonoscopy 05/2021:  - Preparation of the colon was fair. - Five 6 to 8 mm polyps in the transverse colon, removed with a cold snare. Resected and retrieved. - Seven 5 to 7 mm polyps in the sigmoid colon and in the descending colon, removed with a cold snare. Resected and retrieved. - Internal hemorrhoids. - The examination was otherwise normal on direct and retroflexion views. - A tattoo was seen in the transverse colon. A post- polypectomy scar was found at the tattoo site. There was no evidence of residual polyp tissue.  1. Surgical [P], colon, transverse, polyp (5) - TUBULAR ADENOMA (X2 FRAGMENTS). - HYPERPLASTIC POLYP (X3 FRAGMENTS). - NO HIGH GRADE DYSPLASIA OR MALIGNANCY. 2. Surgical [P], colon, sigmoid and descending, polyp (7) - HYPERPLASTIC POLYP (X4 FRAGMENTS). - SESSILE SERRATED POLYP (X3 FRAGMENTS). - NO DYSPLASIA OR MALIGNANCY.  Repeat recommended in 2 years.    He denies any GI complaints.  No rectal bleeding.  Moves his bowels fairly regularly.  Past Medical History:  Diagnosis Date   Abnormal stress test 06/01/2018   Ischemia involving LAD territory   Alcohol abuse, episodic 08/07/2008   Qualifier: Diagnosis of  By: Andrey Campanile MD, LauraLee     Anemia    Angina pectoris (HCC)    Arthritis    CAD (coronary artery disease)    Chronic pain in right foot    COLONIC POLYPS, HX OF 08/07/2008   Qualifier: Diagnosis of  By: Kathy Breach     Coronary artery disease 08/07/2008   Qualifier: Diagnosis of  By: Kathy Breach     Diabetes [facility])    Diabetes mellitus type 2 in obese 08/07/2008   Qualifier: Diagnosis of  By: Ardell Isaacs RMA, Lisa      Diabetes mellitus type II    Diabetic neuropathy (HCC)     Diarrhea 03/05/2021   Dog bite of calf, right, initial encounter 12/25/2018   Elevated PSA 05/03/2014   Essential hypertension 08/07/2008   Qualifier: Diagnosis of  By: Ardell Isaacs RMA, Lisa     FOOT PAIN, RIGHT 09/10/2008   Qualifier: Diagnosis of  By: Andrey Campanile MD, LauraLee     Gangrene of toe of left foot (HCC) 09/03/2021   GERD 08/07/2008   Qualifier: Diagnosis of  By: Kathy Breach     GERD (gastroesophageal reflux disease)    H/O tobacco use, presenting hazards to health 05/21/2009   Qualifier: Diagnosis of  By: Nena Jordan  Last cigarette smoked May 30, 2015     History of colonic polyps    History of diabetic ulcer of foot 05/05/2022   History of revascularization procedure of lower extremity 09/14/2021   Hx of CABG 2000   Hyperlipidemia    on meds   Hyperlipidemia, mixed 08/07/2008   Qualifier: Diagnosis of  By: Ardell Isaacs RMA, Lisa     Hypertension    on meds   Hypomagnesemia    Insomnia 03/25/2013   Left knee pain 11/03/2015   Myocardial infarction [facility]) 2006   Neuromuscular disorder (HCC)    NEUROPATHY   Obesity 06/24/2016   Peripheral neuropathy 09/10/2008   Qualifier: Diagnosis of  By: Andrey Campanile MD, Raliegh Ip     PERIPHERAL  VASCULAR DISEASE 08/07/2008   Qualifier: Diagnosis of  By: Ardell Isaacs RMA, Lisa     Plantar fasciitis    Preventative health care 05/03/2014   Psoriasis 05/03/2014   PVD (peripheral vascular disease) (HCC)    PTA RLE 12/08 [place]   Skin ulcer of left great toe (HCC) 10/20/2021   SOB (shortness of breath) on exertion 05/02/2018   Status post coronary artery bypass graft 05/11/2018   2000   TOBACCO USER 05/21/2009   Qualifier: Diagnosis of  By: Nena Jordan  Last cigarette smoked May 30, 2015     Past Surgical History:  Procedure Laterality Date   CORONARY ARTERY BYPASS GRAFT  2000   200 SVG to diagonal, LIMA to LAD, SVG to LCX   LEFT HEART CATH AND CORS/GRAFTS ANGIOGRAPHY N/A 06/08/2018   Procedure: LEFT HEART CATH AND  CORS/GRAFTS ANGIOGRAPHY;  Surgeon: Lyn Records, MD;  Location: [facility] INVASIVE CV LAB;  Service: Cardiovascular;  Laterality: N/A;   REVISION TOTAL KNEE ARTHROPLASTY Right 2009   TONSILLECTOMY AND ADENOIDECTOMY     VASECTOMY     WISDOM TOOTH EXTRACTION      reports that he quit smoking about 8 years ago. His smoking use included cigarettes. He started smoking about 48 years ago. He has a 20 pack-year smoking history. He has never used smokeless tobacco. He reports that he does not drink alcohol and does not use drugs. family history includes Aneurysm in his father; Cancer in his maternal uncle; Coronary artery disease in his mother; Hyperlipidemia in his father and another family member; Hypertension in his mother and another family member; Prostate cancer in his brother; Stroke in an other family member. No Known Allergies    Outpatient Encounter Medications as of 10/20/2023  Medication Sig   amLODipine (NORVASC) 10 MG tablet Take 1 tablet (10 mg total) by mouth daily.   aspirin 81 MG EC tablet Take 81 mg by mouth daily.   atorvastatin (LIPITOR) 40 MG tablet Take 1 tablet by mouth once daily (Patient taking differently: Take 40 mg by mouth daily. TAKE 1 TABLET BY MOUTH ONCE DAILY .)   clopidogrel (PLAVIX) 75 MG tablet Take 1 tablet by mouth once daily   fish oil-omega-3 fatty acids 1000 MG capsule Take 1 g by mouth at bedtime.   gabapentin (NEURONTIN) 600 MG tablet TAKE 1 TABLET BY MOUTH TWICE DAILY AND 1 AS NEEDED FOR  SEVERE  PAIN.  DO  NOT  EXCEED  3  PER  24  HOURS   glimepiride (AMARYL) 4 MG tablet TAKE 1 TABLET BY MOUTH IN THE MORNING AND AT BEDTIME (Patient taking differently: Take 4 mg by mouth 2 (two) times daily.)   labetalol (NORMODYNE) 200 MG tablet Take 1 tablet (200 mg total) by mouth 2 (two) times daily.   LANTUS SOLOSTAR 100 UNIT/ML Solostar Pen INJECT 25 TO 35 UNITS SUBCUTANEOUSLY AT BEDTIME (Patient taking differently: Inject 25-35 Units into the skin daily.)   lisinopril  (ZESTRIL) 20 MG tablet Take 1 tablet (20 mg total) by mouth daily.   MAGNESIUM EXTRA STRENGTH 400 MG CAPS TAKE 1 CAPSULE BY MOUTH ONCE DAILY (Patient taking differently: Take 1 capsule by mouth daily.)   metFORMIN (GLUCOPHAGE-XR) 500 MG 24 hr tablet Take 2 tablets (1,000 mg total) by mouth 2 (two) times daily.   Multiple Vitamins-Minerals (CENTRUM PO) Take 1 tablet by mouth daily. Unknown strenght   nitroGLYCERIN (NITROSTAT) 0.4 MG SL tablet Place 0.4 mg under the tongue every 5 (five)  minutes as needed for chest pain.   omeprazole (PRILOSEC) 40 MG capsule Take 1 capsule by mouth once daily   ranolazine (RANEXA) 500 MG 12 hr tablet Take 1 tablet by mouth twice daily   Specialty Vitamins Products (MAGNESIUM, AMINO ACID CHELATE,) 133 MG tablet Take 1 tablet by mouth daily. Unknown strenght   Alpha-D-Galactosidase (ANTI-GAS PO) Take 1 tablet by mouth daily. Unknown strenght (Patient not taking: Reported on 10/20/2023)   tiZANidine (ZANAFLEX) 2 MG tablet TKAE 1/2 TO 2 TABLETS BY MOUTH EVERY 6 HOURS AS NEEDED FOR MUSCLE SPASMS (Patient not taking: Reported on 10/20/2023)   VITAMIN D, CHOLECALCIFEROL, PO Take 1 tablet by mouth daily. Unknown strenght (Patient not taking: Reported on 10/20/2023)   No facility-administered encounter medications on file as of 10/20/2023.    REVIEW OF SYSTEMS  : All other systems reviewed and negative except where noted in the History of Present Illness.   PHYSICAL EXAM: BP (!) 116/56   Pulse 67   Ht 6' (1.829 m)   Wt 156 lb (70.8 kg)   BMI 21.16 kg/m  General: Well developed white male in no acute distress Head: Normocephalic and atraumatic Eyes:  Sclerae anicteric, conjunctiva pink. Ears: Normal auditory acuity Lungs: Clear throughout to auscultation; no W/R/R. Heart: Regular rate and rhythm; no M/R/G. Rectal:  Will be done at the time of colonoscopy. Musculoskeletal: Symmetrical with no gross deformities  Skin: No lesions on visible extremities Neurological:  Alert oriented x 4, grossly non-focal Psychological:  Alert and cooperative. Normal mood and affect  ASSESSMENT AND PLAN: *History of colon polyps:  12 polyps removed in 05/2021, several TAs.  Repeat recommended in 2 years.  Will schedule with Dr. Russella Dar. *Antiplatelet use with Plavix for history of CAD and PVD:  Hold Plavix for 5 days before procedure - will instruct when and how to resume after procedure. Risks and benefits of procedure including bleeding, perforation, infection, missed lesions, medication reactions and possible hospitalization or surgery if complications occur explained. Additional rare but real risk of cardiovascular event such as heart attack or ischemia/infarct of other organs off Plavix explained and need to seek urgent help if this occurs. Will communicate by phone or EMR with patient's prescribing provider to confirm that holding Plavix is reasonable in this case.     CC:  Bradd Canary, MD

## 2023-10-27 ENCOUNTER — Encounter: Payer: Self-pay | Admitting: Gastroenterology

## 2023-10-27 NOTE — Telephone Encounter (Signed)
Left message for patient to call office.  

## 2023-10-28 NOTE — Telephone Encounter (Signed)
Left message for patient to call office.  

## 2023-11-02 NOTE — Telephone Encounter (Signed)
Patient informed to hold Plavix.

## 2023-11-08 ENCOUNTER — Encounter: Payer: Self-pay | Admitting: Gastroenterology

## 2023-11-08 ENCOUNTER — Ambulatory Visit: Payer: Federal, State, Local not specified - PPO | Admitting: Gastroenterology

## 2023-11-08 VITALS — BP 116/77 | HR 77 | Temp 97.9°F | Resp 13 | Ht 72.0 in | Wt 200.0 lb

## 2023-11-08 DIAGNOSIS — Z860100 Personal history of colon polyps, unspecified: Secondary | ICD-10-CM

## 2023-11-08 DIAGNOSIS — D123 Benign neoplasm of transverse colon: Secondary | ICD-10-CM | POA: Diagnosis not present

## 2023-11-08 DIAGNOSIS — Z1211 Encounter for screening for malignant neoplasm of colon: Secondary | ICD-10-CM

## 2023-11-08 MED ORDER — SODIUM CHLORIDE 0.9 % IV SOLN: 500.0000 mL | Freq: Once | INTRAVENOUS | Status: DC

## 2023-11-08 NOTE — Progress Notes (Signed)
 See 10/20/2023 H&P no changes

## 2023-11-08 NOTE — Patient Instructions (Signed)
Educational handout provided to patient related to Polyps  Resume previous diet  RESUME PLAVIX AT PRIOR DOSE IN 2 DAYS.  Awaiting pathology results   YOU HAD AN ENDOSCOPIC PROCEDURE TODAY AT THE [facility]:   Refer to the procedure report that was given to you for any specific questions about what was found during the examination.  If the procedure report does not answer your questions, please call your gastroenterologist to clarify.  If you requested that your care partner not be given the details of your procedure findings, then the procedure report has been included in a sealed envelope for you to review at your convenience later.  YOU SHOULD EXPECT: Some feelings of bloating in the abdomen. Passage of more gas than usual.  Walking can help get rid of the air that was put into your GI tract during the procedure and reduce the bloating. If you had a lower endoscopy (such as a colonoscopy or flexible sigmoidoscopy) you may notice spotting of blood in your stool or on the toilet paper. If you underwent a bowel prep for your procedure, you may not have a normal bowel movement for a few days.  Please Note:  You might notice some irritation and congestion in your nose or some drainage.  This is from the oxygen used during your procedure.  There is no need for concern and it should clear up in a day or so.  SYMPTOMS TO REPORT IMMEDIATELY:  Following lower endoscopy (colonoscopy or flexible sigmoidoscopy):  Excessive amounts of blood in the stool  Significant tenderness or worsening of abdominal pains  Swelling of the abdomen that is new, acute  Fever of 100F or higher  For urgent or emergent issues, a gastroenterologist can be reached at any hour by calling [phone]. Do not use MyChart messaging for urgent concerns.    DIET:  We do recommend a small meal at first, but then you may proceed to your regular diet.  Drink plenty of fluids but you should avoid alcoholic  beverages for 24 hours.  ACTIVITY:  You should plan to take it easy for the rest of today and you should NOT DRIVE or use heavy machinery until tomorrow (because of the sedation medicines used during the test).    FOLLOW UP: Our staff will call the number listed on your records the next business day following your procedure.  We will call around 7:15- 8:00 am to check on you and address any questions or concerns that you may have regarding the information given to you following your procedure. If we do not reach you, we will leave a message.     If any biopsies were taken you will be contacted by phone or by letter within the next 1-3 weeks.  Please call us at [phone] if you have not heard about the biopsies in 3 weeks.    SIGNATURES/CONFIDENTIALITY: You and/or your care partner have signed paperwork which will be entered into your electronic medical record.  These signatures attest to the fact that that the information above on your After Visit Summary has been reviewed and is understood.  Full responsibility of the confidentiality of this discharge information lies with you and/or your care-partner.

## 2023-11-08 NOTE — Progress Notes (Signed)
Sedate, gd SR, tolerated procedure well, VSS, report to RN 

## 2023-11-08 NOTE — Op Note (Addendum)
[facility] Patient Name: Gene [name] Procedure Date: 11/08/2023 10:32 AM MRN: [id number] Endoscopist: Meryl Dare , MD, [phone] Age: 67 Referring MD:  Date of Birth: 1956/01/07 Gender: Male Account #: [id number] Procedure:                Colonoscopy Indications:              Surveillance: Personal history of adenomatous and                            sessile serrated polyps on last colonoscopy 3 years                            ago Medicines:                Monitored Anesthesia Care Procedure:                Pre-Anesthesia Assessment:                           - Prior to the procedure, a History and Physical                            was performed, and patient medications and                            allergies were reviewed. The patient's tolerance of                            previous anesthesia was also reviewed. The risks                            and benefits of the procedure and the sedation                            options and risks were discussed with the patient.                            All questions were answered, and informed consent                            was obtained. Prior Anticoagulants: The patient has                            taken Plavix (clopidogrel), last dose was 5 days                            prior to procedure. ASA Grade Assessment: III - A                            patient with severe systemic disease. After                            reviewing the risks and benefits, the patient was  deemed in satisfactory condition to undergo the                            procedure.                           After obtaining informed consent, the colonoscope                            was passed under direct vision. Throughout the                            procedure, the patient's blood pressure, pulse, and                            oxygen saturations were monitored continuously. The                             CF [id number] #[id number] was introduced through the anus                            and advanced to the the cecum, identified by                            appendiceal orifice and ileocecal valve. The                            ileocecal valve, appendiceal orifice, and rectum                            were photographed. The quality of the bowel                            preparation was good. The colonoscopy was performed                            without difficulty. The patient tolerated the                            procedure well. Scope In: 10:45:01 AM Scope Out: 11:03:17 AM Scope Withdrawal Time: 0 hours 15 minutes 2 seconds  Total Procedure Duration: 0 hours 18 minutes 16 seconds  Findings:                 The perianal and digital rectal examinations were                            normal.                           Two sessile polyps were found in the transverse                            colon. The polyps were 6 to 7 mm in size. These  polyps were removed with a cold snare. Resection                            and retrieval were complete.                           A tattoo was seen in the transverse colon. The                            tattoo site appeared normal.                           The exam was otherwise without abnormality on                            direct and retroflexion views. Complications:            No immediate complications. Estimated blood loss:                            None. Estimated Blood Loss:     Estimated blood loss: none. Impression:               - Two 6 to 7 mm polyps in the transverse colon,                            removed with a cold snare. Resected and retrieved.                           - A tattoo was seen in the transverse colon. The                            tattoo site appeared normal.                           - The examination was otherwise normal on direct                            and retroflexion  views. Recommendation:           - Repeat colonoscopy after studies are complete for                            surveillance based on pathology results.                           - Patient has a contact number available for                            emergencies. The signs and symptoms of potential                            delayed complications were discussed with the                            patient. Return  to normal activities tomorrow.                            Written discharge instructions were provided to the                            patient.                           - Resume previous diet.                           - Continue present medications.                           - Await pathology results.                           - Resume Plavix (clopidogrel) at prior dose in 2                            days. Refer to managing physician for further                            adjustment of therapy. Meryl Dare, MD 11/08/2023 11:07:27 AM This report has been signed electronically.

## 2023-11-08 NOTE — Progress Notes (Signed)
Called to room to assist during endoscopic procedure.  Patient ID and intended procedure confirmed with present staff. Received instructions for my participation in the procedure from the performing physician.  

## 2023-11-11 ENCOUNTER — Telehealth: Payer: Self-pay

## 2023-11-11 NOTE — Telephone Encounter (Signed)
  Follow up Call-     11/08/2023    9:52 AM 05/29/2021    1:35 PM  Call back number  Post procedure Call Back phone  # [phone]  Permission to leave phone message Yes Yes    Attempted to call patient regarding follow-up. No answer, VM left.

## 2023-11-14 ENCOUNTER — Ambulatory Visit: Payer: Federal, State, Local not specified - PPO | Admitting: Family Medicine

## 2023-11-14 ENCOUNTER — Encounter: Payer: Self-pay | Admitting: Gastroenterology

## 2023-11-14 ENCOUNTER — Ambulatory Visit: Payer: Federal, State, Local not specified - PPO | Admitting: Physician Assistant

## 2023-11-14 ENCOUNTER — Encounter: Payer: Self-pay | Admitting: Physician Assistant

## 2023-11-14 VITALS — BP 163/79 | HR 71 | Temp 98.0°F | Ht 72.0 in | Wt 195.0 lb

## 2023-11-14 DIAGNOSIS — I152 Hypertension secondary to endocrine disorders: Secondary | ICD-10-CM

## 2023-11-14 DIAGNOSIS — E669 Obesity, unspecified: Secondary | ICD-10-CM

## 2023-11-14 DIAGNOSIS — E1169 Type 2 diabetes mellitus with other specified complication: Secondary | ICD-10-CM | POA: Diagnosis not present

## 2023-11-14 DIAGNOSIS — M25561 Pain in right knee: Secondary | ICD-10-CM

## 2023-11-14 DIAGNOSIS — E1159 Type 2 diabetes mellitus with other circulatory complications: Secondary | ICD-10-CM | POA: Diagnosis not present

## 2023-11-14 DIAGNOSIS — Z7984 Long term (current) use of oral hypoglycemic drugs: Secondary | ICD-10-CM

## 2023-11-14 DIAGNOSIS — R809 Proteinuria, unspecified: Secondary | ICD-10-CM | POA: Diagnosis not present

## 2023-11-14 DIAGNOSIS — E1142 Type 2 diabetes mellitus with diabetic polyneuropathy: Secondary | ICD-10-CM | POA: Diagnosis not present

## 2023-11-14 LAB — MICROALBUMIN / CREATININE URINE RATIO
Creatinine,U: 84.7 mg/dL
Microalb Creat Ratio: 112.3 mg/g — ABNORMAL HIGH (ref 0.0–30.0)
Microalb, Ur: 95.1 mg/dL — ABNORMAL HIGH (ref 0.0–1.9)

## 2023-11-14 LAB — COMPREHENSIVE METABOLIC PANEL
ALT: 11 U/L (ref 0–53)
AST: 13 U/L (ref 0–37)
Albumin: 4.1 g/dL (ref 3.5–5.2)
Alkaline Phosphatase: 98 U/L (ref 39–117)
BUN: 17 mg/dL (ref 6–23)
CO2: 29 meq/L (ref 19–32)
Calcium: 9.2 mg/dL (ref 8.4–10.5)
Chloride: 99 meq/L (ref 96–112)
Creatinine, Ser: 1.08 mg/dL (ref 0.40–1.50)
GFR: 70.36 mL/min (ref >60.00)
Glucose, Bld: 235 mg/dL — ABNORMAL HIGH (ref 70–99)
Potassium: 5 meq/L (ref 3.5–5.1)
Sodium: 137 meq/L (ref 135–145)
Total Bilirubin: 0.4 mg/dL (ref 0.2–1.2)
Total Protein: 6.6 g/dL (ref 6.0–8.3)

## 2023-11-14 LAB — SURGICAL PATHOLOGY

## 2023-11-14 LAB — MAGNESIUM: Magnesium: 1.2 mg/dL — ABNORMAL LOW (ref 1.5–2.5)

## 2023-11-14 LAB — HEMOGLOBIN A1C: Hgb A1c MFr Bld: 9.6 % — ABNORMAL HIGH (ref 4.6–6.5)

## 2023-11-14 NOTE — Assessment & Plan Note (Signed)
Last A1c 07/2023 was 9.0% Manages with glimepiride 4 mg bid, metformin 1000 mg bid, and lantus 25-30 U daily. Pt has not been taking lantus consistently, he blames on his work schedule. Suggesting switching to AM -- but being careful to watch his BS throughout the day. Will repeat A1c, ordered uacr, foot exam completed today.  On statin, acei F/u 3-4 mo w/ pcp

## 2023-11-14 NOTE — Assessment & Plan Note (Signed)
Typically well controlled, elevated today Manages with amlodipine 10 mg, labetaolol 200 mg bid, lisinopril 20 mg . F/u 3-4 mo w/ pcp. Pt follows with cardiology

## 2023-11-14 NOTE — Progress Notes (Signed)
Established patient visit   Patient: Gene [name]   DOB: 1956/09/25   67 y.o. Male  MRN: [id number] Visit Date: 11/14/2023  Today's healthcare provider: Alfredia Ferguson, PA-C   Cc. Dm, HTN, HLD f/u  Subjective    Diabetes Mellitus Type II, Follow-up  Lab Results  Component Value Date   HGBA1C 9.0 (H) 07/11/2023   HGBA1C 9.2 (H) 03/07/2023   HGBA1C 8.8 (H) 07/05/2022   Wt Readings from Last 3 Encounters:  11/14/23 195 lb (88.5 kg)  11/08/23 200 lb (90.7 kg)  09/29/23 200 lb (90.7 kg)   Home blood sugar records: not consistently checking  Episodes of hypoglycemia? Reports sometimes 70 in the AM   Current insulin regiment: per pt, 25 U occasionally. Not daily or consistent  Pertinent Labs: Lab Results  Component Value Date   CHOL 105 07/11/2023   HDL 30.70 (L) 07/11/2023   LDLCALC 51 07/11/2023   TRIG 117.0 07/11/2023   CHOLHDL 3 07/11/2023   Lab Results  Component Value Date   NA 139 07/11/2023   K 5.3 07/11/2023   CREATININE 1.25 07/11/2023   EGFR 63 07/11/2023   MICRALBCREAT 42.4 (H) 07/11/2023     --------------------------------------------------------------------------------------------------- Pt reports worsening of neuropathy-- he uses a hemp cream and is planning to start a magnesium cream.  He reports falling on his right knee 2 months ago, continues to have some discomfort to touch. Denies difficulty or pain when ambulating.   Medications: Outpatient Medications Prior to Visit  Medication Sig   Alpha-D-Galactosidase (ANTI-GAS PO) Take 1 tablet by mouth daily. Unknown strenght   amLODipine (NORVASC) 10 MG tablet Take 1 tablet (10 mg total) by mouth daily.   aspirin 81 MG EC tablet Take 81 mg by mouth daily.   atorvastatin (LIPITOR) 40 MG tablet Take 1 tablet by mouth once daily (Patient taking differently: Take 40 mg by mouth daily. TAKE 1 TABLET BY MOUTH ONCE DAILY .)   clopidogrel (PLAVIX) 75 MG tablet Take 1 tablet by mouth once daily    fish oil-omega-3 fatty acids 1000 MG capsule Take 1 g by mouth at bedtime.   gabapentin (NEURONTIN) 600 MG tablet TAKE 1 TABLET BY MOUTH TWICE DAILY AND 1 AS NEEDED FOR  SEVERE  PAIN.  DO  NOT  EXCEED  3  PER  24  HOURS   glimepiride (AMARYL) 4 MG tablet TAKE 1 TABLET BY MOUTH IN THE MORNING AND AT BEDTIME (Patient taking differently: Take 4 mg by mouth 2 (two) times daily.)   labetalol (NORMODYNE) 200 MG tablet Take 1 tablet (200 mg total) by mouth 2 (two) times daily.   LANTUS SOLOSTAR 100 UNIT/ML Solostar Pen INJECT 25 TO 35 UNITS SUBCUTANEOUSLY AT BEDTIME (Patient taking differently: Inject 25-35 Units into the skin daily.)   lisinopril (ZESTRIL) 20 MG tablet Take 1 tablet (20 mg total) by mouth daily.   MAGNESIUM EXTRA STRENGTH 400 MG CAPS TAKE 1 CAPSULE BY MOUTH ONCE DAILY (Patient taking differently: Take 1 capsule by mouth daily.)   metFORMIN (GLUCOPHAGE-XR) 500 MG 24 hr tablet Take 2 tablets (1,000 mg total) by mouth 2 (two) times daily.   Multiple Vitamins-Minerals (CENTRUM PO) Take 1 tablet by mouth daily. Unknown strenght   nitroGLYCERIN (NITROSTAT) 0.4 MG SL tablet Place 0.4 mg under the tongue every 5 (five) minutes as needed for chest pain.   omeprazole (PRILOSEC) 40 MG capsule Take 1 capsule by mouth once daily   ranolazine (RANEXA) 500 MG 12 hr tablet  Take 1 tablet by mouth twice daily   Specialty Vitamins Products (MAGNESIUM, AMINO ACID CHELATE,) 133 MG tablet Take 1 tablet by mouth daily. Unknown strenght   tiZANidine (ZANAFLEX) 2 MG tablet TKAE 1/2 TO 2 TABLETS BY MOUTH EVERY 6 HOURS AS NEEDED FOR MUSCLE SPASMS   VITAMIN D, CHOLECALCIFEROL, PO Take 1 tablet by mouth daily. Unknown strenght   No facility-administered medications prior to visit.    Review of Systems  Constitutional:  Negative for fatigue and fever.  Respiratory:  Negative for cough and shortness of breath.   Cardiovascular:  Negative for chest pain, palpitations and leg swelling.  Neurological:  Negative for  dizziness and headaches.       Objective    BP (!) 163/79   Pulse 71   Temp 98 F (36.7 C) (Oral)   Ht 6' (1.829 m)   Wt 195 lb (88.5 kg)   SpO2 99%   BMI 26.45 kg/m    Physical Exam Constitutional:      General: He is awake.     Appearance: He is well-developed.  HENT:     Head: Normocephalic.  Eyes:     Conjunctiva/sclera: Conjunctivae normal.  Cardiovascular:     Rate and Rhythm: Normal rate and regular rhythm.     Pulses:          Dorsalis pedis pulses are 2+ on the right side and 2+ on the left side.       Posterior tibial pulses are 2+ on the right side and 2+ on the left side.     Heart sounds: Normal heart sounds.  Pulmonary:     Effort: Pulmonary effort is normal.     Breath sounds: Normal breath sounds.  Musculoskeletal:     Comments: Right knee with no instability, bruising, effusion  Feet:     Right foot:     Protective Sensation: 4 sites tested.  0 sites sensed.     Skin integrity: Dry skin present.     Toenail Condition: Right toenails are abnormally thick.     Left foot:     Protective Sensation: 4 sites tested.   1 site sensed.    Skin integrity: Dry skin present.     Toenail Condition: Left toenails are abnormally thick.  Skin:    General: Skin is warm.  Neurological:     Mental Status: He is alert and oriented to person, place, and time.  Psychiatric:        Attention and Perception: Attention normal.        Mood and Affect: Mood normal.        Speech: Speech normal.        Behavior: Behavior is cooperative.      No results found for any visits on 11/14/23.  Assessment & Plan    Type 2 diabetes mellitus with obesity [facility]) Assessment & Plan: Last A1c 07/2023 was 9.0% Manages with glimepiride 4 mg bid, metformin 1000 mg bid, and lantus 25-30 U daily. Pt has not been taking lantus consistently, he blames on his work schedule. Suggesting switching to AM -- but being careful to watch his BS throughout the day. Will repeat A1c, ordered uacr,  foot exam completed today.  On statin, acei F/u 3-4 mo w/ pcp  Orders: -     Hemoglobin A1c -     Comprehensive metabolic panel  Diabetic polyneuropathy associated with type 2 diabetes mellitus (HCC) Assessment & Plan: R worse then L  Little sensation to monofilament  testing. He does not follow with podiatry. Cautioned on foot care, foot injury.  Manages with gabapentin 600 mg up to TID   Hypertension associated with diabetes [facility]) Assessment & Plan: Typically well controlled, elevated today Manages with amlodipine 10 mg, labetaolol 200 mg bid, lisinopril 20 mg . F/u 3-4 mo w/ pcp. Pt follows with cardiology   Hypomagnesemia -     Magnesium  Microalbuminuria -     Microalbumin / creatinine urine ratio  Acute pain of right knee   Right knee pain -suggesting xray given h/o of replacement/hardware, pt declines   Return in about 3 months (around 02/12/2024) for DMII w/ pcp.       Alfredia Ferguson, PA-C  [facility] [phone] (phone) [phone] (fax)  [facility]

## 2023-11-14 NOTE — Assessment & Plan Note (Signed)
R worse then L  Little sensation to monofilament testing. He does not follow with podiatry. Cautioned on foot care, foot injury.  Manages with gabapentin 600 mg up to TID

## 2023-11-17 ENCOUNTER — Other Ambulatory Visit: Payer: Self-pay

## 2023-12-13 ENCOUNTER — Other Ambulatory Visit: Payer: Self-pay | Admitting: Cardiology

## 2023-12-13 ENCOUNTER — Other Ambulatory Visit: Payer: Self-pay | Admitting: Family Medicine

## 2023-12-22 ENCOUNTER — Ambulatory Visit: Payer: Medicare Other | Admitting: Family Medicine

## 2023-12-22 VITALS — BP 118/64 | HR 74 | Temp 97.7°F | Resp 16 | Ht 71.0 in | Wt 186.0 lb

## 2023-12-22 DIAGNOSIS — R790 Abnormal level of blood mineral: Secondary | ICD-10-CM

## 2023-12-22 DIAGNOSIS — Z7902 Long term (current) use of antithrombotics/antiplatelets: Secondary | ICD-10-CM

## 2023-12-22 DIAGNOSIS — E1169 Type 2 diabetes mellitus with other specified complication: Secondary | ICD-10-CM

## 2023-12-22 DIAGNOSIS — I10 Essential (primary) hypertension: Secondary | ICD-10-CM | POA: Diagnosis not present

## 2023-12-22 DIAGNOSIS — R972 Elevated prostate specific antigen [PSA]: Secondary | ICD-10-CM | POA: Diagnosis not present

## 2023-12-22 DIAGNOSIS — E669 Obesity, unspecified: Secondary | ICD-10-CM

## 2023-12-22 DIAGNOSIS — E782 Mixed hyperlipidemia: Secondary | ICD-10-CM

## 2023-12-22 DIAGNOSIS — R1030 Lower abdominal pain, unspecified: Secondary | ICD-10-CM

## 2023-12-22 MED ORDER — METHOCARBAMOL 750 MG PO TABS: 750.0000 mg | ORAL_TABLET | Freq: Three times a day (TID) | ORAL | 2 refills | Status: DC | PRN

## 2023-12-23 ENCOUNTER — Other Ambulatory Visit: Payer: Self-pay | Admitting: Family Medicine

## 2023-12-23 LAB — MAGNESIUM: Magnesium: 1.4 mg/dL — ABNORMAL LOW (ref 1.5–2.5)

## 2023-12-23 LAB — PSA: PSA: 14.74 ng/mL — ABNORMAL HIGH (ref 0.10–4.00)

## 2023-12-26 ENCOUNTER — Encounter: Payer: Self-pay | Admitting: Family Medicine

## 2023-12-26 ENCOUNTER — Telehealth: Payer: Self-pay

## 2023-12-26 ENCOUNTER — Other Ambulatory Visit: Payer: Self-pay | Admitting: Family Medicine

## 2023-12-26 MED ORDER — TIZANIDINE HCL 2 MG PO TABS: 1.0000 mg | ORAL_TABLET | Freq: Four times a day (QID) | ORAL | 2 refills | Status: AC | PRN

## 2023-12-26 NOTE — Progress Notes (Signed)
 Subjective:    Patient ID: Gene [name], male    DOB: October 17, 1956, 68 y.o.   MRN: [id number]  Chief Complaint  Patient presents with  . Follow-up  . Otitis Media    Hospital    HPI Discussed the use of AI scribe software for clinical note transcription with the patient, who gave verbal consent to proceed.  History of Present Illness   The patient, with a history of elevated PSA levels and high blood sugar, presents with a chief complaint of groin pain that has now moved to the back. The pain started suddenly one day with severe pain in the left testicle. The following day, the pain moved to the groin area, making it difficult for the patient to move his leg due to the associated muscle movement. The patient believes the pain might have been triggered by lifting something heavy at work. The patient has been managing the pain with methylcarbamol, which he reports helps but does not completely alleviate the pain.  The patient also reports having high blood sugar levels, which he is trying to manage by watching his carbohydrate intake and trying to eat more protein. He mentions that his poor sleep might be contributing to his high sugar levels. The patient reports getting about four hours of sleep due to his work schedule, which starts early in the morning. He tries to catch up on sleep during his lunch break and on days off.  The patient is also taking magnesium  supplements every other day, but has not had a chance to get his magnesium  levels checked recently due to his work schedule. He reports no other symptoms such as palpitations, muscle cramps, diarrhea, burning when urinating, fever, or chills.        Past Medical History:  Diagnosis Date  . Abnormal stress test 06/01/2018   Ischemia involving LAD territory  . Alcohol abuse, episodic 08/07/2008   Qualifier: Diagnosis of  By: Tanda MD, Jay    . Anemia   . Angina pectoris (HCC)   . Arthritis   . CAD (coronary artery  disease)   . Chronic pain in right foot   . COLONIC POLYPS, HX OF 08/07/2008   Qualifier: Diagnosis of  By: Clair SHARALYN Planas    . Coronary artery disease 08/07/2008   Qualifier: Diagnosis of  By: Clair SHARALYN Planas    . Diabetes (HCC)   . Diabetes mellitus type 2 in obese 08/07/2008   Qualifier: Diagnosis of  By: Clair SHARALYN Planas     . Diabetes mellitus type II   . Diabetic neuropathy (HCC)   . Diarrhea 03/05/2021  . Dog bite of calf, right, initial encounter 12/25/2018  . Elevated PSA 05/03/2014  . Essential hypertension 08/07/2008   Qualifier: Diagnosis of  By: Clair SHARALYN Planas    . FOOT PAIN, RIGHT 09/10/2008   Qualifier: Diagnosis of  By: Tanda MD, Jay    . Gangrene of toe of left foot (HCC) 09/03/2021  . GERD 08/07/2008   Qualifier: Diagnosis of  By: Clair SHARALYN Planas    . GERD (gastroesophageal reflux disease)   . H/O tobacco use, presenting hazards to health 05/21/2009   Qualifier: Diagnosis of  By: Georgian ROSALEA CHARM Lamar  Last cigarette smoked May 30, 2015    . History of colonic polyps   . History of diabetic ulcer of foot 05/05/2022  . History of revascularization procedure of lower extremity 09/14/2021  . Hx of CABG 2000  . Hyperlipidemia  on meds  . Hyperlipidemia, mixed 08/07/2008   Qualifier: Diagnosis of  By: Clair SHARALYN Planas    . Hypertension    on meds  . Hypomagnesemia   . Insomnia 03/25/2013  . Left knee pain 11/03/2015  . Myocardial infarction (HCC) 2006  . Neuromuscular disorder (HCC)    NEUROPATHY  . Obesity 06/24/2016  . Peripheral neuropathy 09/10/2008   Qualifier: Diagnosis of  By: Tanda MD, Jay    . PERIPHERAL VASCULAR DISEASE 08/07/2008   Qualifier: Diagnosis of  By: Clair SHARALYN Planas    . Plantar fasciitis   . Preventative health care 05/03/2014  . Psoriasis 05/03/2014  . PVD (peripheral vascular disease) (HCC)    PTA RLE 12/08 [place]   . Skin ulcer of left great toe (HCC) 10/20/2021  . SOB (shortness of breath) on  exertion 05/02/2018  . Status post coronary artery bypass graft 05/11/2018   2000  . TOBACCO USER 05/21/2009   Qualifier: Diagnosis of  By: Georgian ROSALEA CHARM Lamar  Last cigarette smoked May 30, 2015      Past Surgical History:  Procedure Laterality Date  . CORONARY ARTERY BYPASS GRAFT  2000   200 SVG to diagonal, LIMA to LAD, SVG to LCX  . LEFT HEART CATH AND CORS/GRAFTS ANGIOGRAPHY N/A 06/08/2018   Procedure: LEFT HEART CATH AND CORS/GRAFTS ANGIOGRAPHY;  Surgeon: Claudene Victory ORN, MD;  Location: [facility];  Service: Cardiovascular;  Laterality: N/A;  . REVISION TOTAL KNEE ARTHROPLASTY Right 2009  . TONSILLECTOMY AND ADENOIDECTOMY    . VASECTOMY    . WISDOM TOOTH EXTRACTION      Family History  Problem Relation Age of Onset  . Coronary artery disease Mother   . Hypertension Mother   . Hyperlipidemia Father   . Aneurysm Father        brain  . Prostate cancer Brother   . Cancer Maternal Uncle   . Hyperlipidemia Other   . Hypertension Other   . Stroke Other   . Colon cancer Neg Hx   . Esophageal cancer Neg Hx   . Liver cancer Neg Hx   . Pancreatic cancer Neg Hx   . Rectal cancer Neg Hx   . Stomach cancer Neg Hx   . Colon polyps Neg Hx     Social History   Socioeconomic History  . Marital status: Married    Spouse name: Not on file  . Number of children: Not on file  . Years of education: Not on file  . Highest education level: Not on file  Occupational History  . Not on file  Tobacco Use  . Smoking status: Former    Current packs/day: 0.00    Average packs/day: 0.5 packs/day for 40.0 years (20.0 ttl pk-yrs)    Types: Cigarettes    Start date: 05/04/1975    Quit date: 05/04/2015    Years since quitting: 8.6  . Smokeless tobacco: Never  Vaping Use  . Vaping status: Never Used  Substance and Sexual Activity  . Alcohol use: No    Alcohol/week: 0.0 standard drinks of alcohol  . Drug use: No    Comment: rare  . Sexual activity: Yes    Comment: lives with  wife, retired   Other Topics Concern  . Not on file  Social History Narrative   Occupation: grocery clerk   Second marriage - 3 children from first marriage   Tobacco Use - Yes.     Alcohol Use - yes(rare)  Social Drivers of Corporate Investment Banker Strain: Not on file  Food Insecurity: Not on file  Transportation Needs: Not on file  Physical Activity: Not on file  Stress: Not on file  Social Connections: Unknown (03/30/2022)   [facility] from [facility], [facility]   Social Network   . Social Network: Not on file  Intimate Partner Violence: Unknown (02/18/2022)   [facility] from [facility], [facility]   HITS   . Physically Hurt: Not on file   . Insult or Talk Down To: Not on file   . Threaten Physical Harm: Not on file   . Scream or Curse: Not on file    Outpatient Medications Prior to Visit  Medication Sig Dispense Refill  . Alpha-D-Galactosidase (ANTI-GAS PO) Take 1 tablet by mouth daily. Unknown strenght    . amLODipine  (NORVASC ) 10 MG tablet Take 1 tablet (10 mg total) by mouth daily. 90 tablet 1  . aspirin  81 MG EC tablet Take 81 mg by mouth daily.    . atorvastatin  (LIPITOR) 40 MG tablet Take 1 tablet by mouth once daily (Patient taking differently: Take 40 mg by mouth daily. TAKE 1 TABLET BY MOUTH ONCE DAILY .) 90 tablet 2  . clopidogrel  (PLAVIX ) 75 MG tablet Take 1 tablet by mouth once daily 90 tablet 0  . fish oil-omega-3 fatty acids 1000 MG capsule Take 1 g by mouth at bedtime.    . gabapentin  (NEURONTIN ) 600 MG tablet TAKE 1 TABLET BY MOUTH TWICE DAILY AND 1 AS NEEDED FOR  SEVERE  PAIN.  DO  NOT  EXCEED  3  PER  24  HOURS 270 tablet 0  . glimepiride  (AMARYL ) 4 MG tablet TAKE 1 TABLET BY MOUTH IN THE MORNING AND AT BEDTIME (Patient taking differently: Take 4 mg by mouth 2 (two) times daily.) 180 tablet 0  . labetalol  (NORMODYNE ) 200 MG tablet Take 1 tablet (200 mg total) by mouth 2 (two) times daily. 180 tablet 0  . LANTUS  SOLOSTAR 100 UNIT/ML  Solostar Pen INJECT 25 TO 35 UNITS SUBCUTANEOUSLY AT BEDTIME (Patient taking differently: Inject 25-35 Units into the skin daily.) 30 mL 0  . lisinopril  (ZESTRIL ) 20 MG tablet Take 1 tablet (20 mg total) by mouth daily. 90 tablet 2  . MAGNESIUM  EXTRA STRENGTH 400 MG CAPS TAKE 1 CAPSULE BY MOUTH ONCE DAILY (Patient taking differently: Take 1 capsule by mouth daily.) 30 capsule 3  . metFORMIN  (GLUCOPHAGE -XR) 500 MG 24 hr tablet Take 2 tablets (1,000 mg total) by mouth 2 (two) times daily. 360 tablet 1  . Multiple Vitamins-Minerals (CENTRUM PO) Take 1 tablet by mouth daily. Unknown strenght    . nitroGLYCERIN (NITROSTAT) 0.4 MG SL tablet Place 0.4 mg under the tongue every 5 (five) minutes as needed for chest pain.    . omeprazole  (PRILOSEC) 40 MG capsule Take 1 capsule by mouth once daily 90 capsule 0  . ranolazine  (RANEXA ) 500 MG 12 hr tablet Take 1 tablet (500 mg total) by mouth 2 (two) times daily. 180 tablet 2  . Specialty Vitamins Products (MAGNESIUM , AMINO ACID CHELATE,) 133 MG tablet Take 1 tablet by mouth daily. Unknown strenght    . VITAMIN D, CHOLECALCIFEROL, PO Take 1 tablet by mouth daily. Unknown strenght    . tiZANidine  (ZANAFLEX ) 2 MG tablet TKAE 1/2 TO 2 TABLETS BY MOUTH EVERY 6 HOURS AS NEEDED FOR MUSCLE SPASMS 20 tablet 0   No facility-administered medications prior to visit.    No Known Allergies  Review of Systems  Constitutional:  Negative for fever and malaise/fatigue.  HENT:  Negative for congestion.   Eyes:  Negative for blurred vision.  Respiratory:  Negative for shortness of breath.   Cardiovascular:  Negative for chest pain, palpitations and leg swelling.  Gastrointestinal:  Negative for abdominal pain, blood in stool and nausea.  Genitourinary:  Negative for dysuria, flank pain and frequency.  Musculoskeletal:  Positive for back pain and myalgias. Negative for falls.  Skin:  Negative for rash.  Neurological:  Negative for dizziness, loss of consciousness and  headaches.  Endo/Heme/Allergies:  Negative for environmental allergies.  Psychiatric/Behavioral:  Negative for depression. The patient is not nervous/anxious.       Objective:    Physical Exam Vitals reviewed.  Constitutional:      Appearance: Normal appearance. He is not ill-appearing.  HENT:     Head: Normocephalic and atraumatic.     Nose: Nose normal.  Eyes:     Conjunctiva/sclera: Conjunctivae normal.  Cardiovascular:     Rate and Rhythm: Normal rate.     Pulses: Normal pulses.     Heart sounds: Normal heart sounds. No murmur heard. Pulmonary:     Effort: Pulmonary effort is normal.     Breath sounds: Normal breath sounds. No wheezing.  Abdominal:     Palpations: Abdomen is soft. There is no mass.     Tenderness: There is no abdominal tenderness.  Musculoskeletal:     Cervical back: Normal range of motion.     Right lower leg: No edema.     Left lower leg: No edema.  Skin:    General: Skin is warm and dry.  Neurological:     General: No focal deficit present.     Mental Status: He is alert and oriented to person, place, and time.  Psychiatric:        Mood and Affect: Mood normal.   BP 118/64 (BP Location: Left Arm, Patient Position: Sitting, Cuff Size: Normal)   Pulse 74   Temp 97.7 F (36.5 C) (Oral)   Resp 16   Ht 5' 11 (1.803 m)   Wt 186 lb (84.4 kg)   SpO2 97%   BMI 25.94 kg/m  Wt Readings from Last 3 Encounters:  12/22/23 186 lb (84.4 kg)  11/14/23 195 lb (88.5 kg)  11/08/23 200 lb (90.7 kg)    Diabetic Foot Exam - Simple   No data filed    Lab Results  Component Value Date   WBC 9.1 07/11/2023   HGB 11.5 (L) 07/11/2023   HCT 35.1 (L) 07/11/2023   PLT 415.0 (H) 07/11/2023   GLUCOSE 235 (H) 11/14/2023   CHOL 105 07/11/2023   TRIG 117.0 07/11/2023   HDL 30.70 (L) 07/11/2023   LDLCALC 51 07/11/2023   ALT 11 11/14/2023   AST 13 11/14/2023   NA 137 11/14/2023   K 5.0 11/14/2023   CL 99 11/14/2023   CREATININE 1.08 11/14/2023   BUN 17  11/14/2023   CO2 29 11/14/2023   TSH 1.18 07/11/2023   PSA 14.74 (H) 12/22/2023   INR 1.1 07/31/2008   HGBA1C 9.6 (H) 11/14/2023   MICROALBUR 95.1 (H) 11/14/2023    Lab Results  Component Value Date   TSH 1.18 07/11/2023   Lab Results  Component Value Date   WBC 9.1 07/11/2023   HGB 11.5 (L) 07/11/2023   HCT 35.1 (L) 07/11/2023   MCV 93.8 07/11/2023   PLT 415.0 (H) 07/11/2023   Lab Results  Component Value Date   NA 137 11/14/2023   K 5.0 11/14/2023   CO2 29 11/14/2023   GLUCOSE 235 (H) 11/14/2023   BUN 17 11/14/2023   CREATININE 1.08 11/14/2023   BILITOT 0.4 11/14/2023   ALKPHOS 98 11/14/2023   AST 13 11/14/2023   ALT 11 11/14/2023   PROT 6.6 11/14/2023   ALBUMIN 4.1 11/14/2023   CALCIUM  9.2 11/14/2023   ANIONGAP 14 06/08/2018   EGFR 63 07/11/2023   GFR 70.36 11/14/2023   Lab Results  Component Value Date   CHOL 105 07/11/2023   Lab Results  Component Value Date   HDL 30.70 (L) 07/11/2023   Lab Results  Component Value Date   LDLCALC 51 07/11/2023   Lab Results  Component Value Date   TRIG 117.0 07/11/2023   Lab Results  Component Value Date   CHOLHDL 3 07/11/2023   Lab Results  Component Value Date   HGBA1C 9.6 (H) 11/14/2023       Assessment & Plan:  Low magnesium  levels -     Magnesium  -     Magnesium ; Future  Type 2 diabetes mellitus with obesity (HCC) -     Hemoglobin A1c; Future -     Microalbumin / creatinine urine ratio; Future  Hypomagnesemia  Hyperlipidemia, mixed -     Lipid panel; Future  Essential hypertension -     Comprehensive metabolic panel; Future -     CBC with Differential/Platelet; Future -     TSH; Future  Elevated PSA -     PSA  Antiplatelet or antithrombotic long-term use  Inguinal pain, unspecified laterality  Other orders -     Methocarbamol ; Take 1 tablet (750 mg total) by mouth every 8 (eight) hours as needed for muscle spasms.  Dispense: 90 tablet; Refill: 2    Assessment and Plan     Groin Pain Recent onset of pain in the groin and back, possibly due to muscle strain. Pain is managed with Methocarbamol . No significant findings on recent CT scans and ultrasound. -Continue Methocarbamol  as needed for pain. -Consider referral to sports medicine if pain persists or becomes recurrent.  Sleep Deprivation Reports only getting 4 hours of sleep per night due to work demands. Previously averaged 6-7 hours of sleep per night. -Encouraged to strive for 6-8 hours of sleep per night for optimal health. -Short naps during the day are acceptable and can be beneficial.  Diabetes Elevated blood sugar levels, with a recent A1c of 9.6 in December. Patient is making efforts to manage diet. -Continue monitoring carbohydrate intake and pairing with protein. -Check A1c on 02/12/2024. -Consider referral to endocrinology if blood sugar levels remain high.  Hypertension Blood pressure readings have been mostly in the 70s, but patient reports it was 135/95 this morning. -Continue current management plan.  General Health Maintenance -Check Magnesium  levels today (12/22/2023) and adjust dosage as needed based on results. -Check PSA today (12/22/2023) due to past elevated levels and current groin pain. -Consider referral to urology if PSA levels are elevated. -Schedule lab appointment for 02/14/2024. -Next appointment scheduled for 04/05/2024.         Harlene Horton, MD

## 2023-12-26 NOTE — Telephone Encounter (Signed)
 Copied from CRM [id number]. Topic: Clinical - Prescription Issue >> Dec 26, 2023 11:52 AM Adonis Hoot wrote: Reason for CRM: patient called in stating that [facility] is out of stock of methocarbamol  (ROBAXIN -750) 750 MG tablet,he would like to know if he could just have the tizanidine  sent in for him as suggested by provider,and he declined.But now that the pharmacy doesn't have this medication in stock he would like it sent in for him.

## 2023-12-29 ENCOUNTER — Other Ambulatory Visit: Payer: Self-pay | Admitting: Emergency Medicine

## 2023-12-29 DIAGNOSIS — R790 Abnormal level of blood mineral: Secondary | ICD-10-CM

## 2024-01-03 ENCOUNTER — Other Ambulatory Visit (INDEPENDENT_AMBULATORY_CARE_PROVIDER_SITE_OTHER): Payer: Medicare Other

## 2024-01-03 DIAGNOSIS — R790 Abnormal level of blood mineral: Secondary | ICD-10-CM

## 2024-01-03 LAB — MAGNESIUM: Magnesium: 1.6 mg/dL (ref 1.5–2.5)

## 2024-01-04 ENCOUNTER — Telehealth: Payer: Self-pay | Admitting: *Deleted

## 2024-01-04 NOTE — Telephone Encounter (Signed)
Copied from CRM [id number]. Topic: Clinical - Medication Question >> Jan 04, 2024  3:19 PM Chantha C wrote: Reason for CRM: Duwayne Heck from [facility] [phone] is checking in the office received a fax requesting a continuous glucose monitor Fax# [phone]

## 2024-01-05 ENCOUNTER — Telehealth: Payer: Self-pay | Admitting: Emergency Medicine

## 2024-01-05 NOTE — Telephone Encounter (Signed)
Copied from CRM [id number]. Topic: General - Other >> Jan 05, 2024  1:27 PM Elizebeth Brooking wrote: Reason for CRM: [facility] called in to see the status of the form that was faxed over requesting the continuous glucose monitor  you can give a callback at     [phone]

## 2024-01-05 NOTE — Telephone Encounter (Signed)
Paperwork has been received, signed and faxed back to [employer].

## 2024-01-05 NOTE — Telephone Encounter (Signed)
Copied from CRM [id number]. Topic: General - Other >> Jan 05, 2024  3:46 PM Almira Coaster wrote: Reason for CRM: [facility] is calling because they need most recent office notes with diabetic diagnosis signed and dated to be faxed. Fax number is [phone].

## 2024-01-06 NOTE — Telephone Encounter (Signed)
Gene [name] with [employer] called and asked for the marked out corrected section to be dated. And then faxed back to them.

## 2024-01-09 ENCOUNTER — Other Ambulatory Visit: Payer: Self-pay | Admitting: Family Medicine

## 2024-01-09 NOTE — Telephone Encounter (Signed)
 We received the form and faxed it back with the initials and date

## 2024-01-10 ENCOUNTER — Telehealth: Payer: Self-pay | Admitting: Emergency Medicine

## 2024-01-10 ENCOUNTER — Other Ambulatory Visit: Payer: Self-pay | Admitting: Family Medicine

## 2024-01-10 NOTE — Telephone Encounter (Signed)
 Faxed paperwork, including notes to [facility] today. Called to confirm they received it. Hansen said patient's medicine is being processed to be shipped

## 2024-01-10 NOTE — Telephone Encounter (Signed)
 Copied from CRM [id number]. Topic: Clinical - Prescription Issue >> Jan 06, 2024  9:43 AM Isabell A wrote: Reason for CRM: Larita Fife from [facility] calling in regard to patients recent diabetic office notes, received signed prescription yesterday. Larita Fife is needing the notes to give patient his diabetic supplies.  Callback number: [phone] Fax no: [phone]   Needs to be completed today so patient can receive his supplies over the weekend.

## 2024-01-10 NOTE — Telephone Encounter (Signed)
 Called and spoke with representative from [facility]. They received the fax with the corrections that they requested be made

## 2024-01-10 NOTE — Telephone Encounter (Signed)
 Prescription need's to be corrected there need's to be a date beside the dr initials on the prescription    marvin from [facility] [phone] is calling in

## 2024-01-15 ENCOUNTER — Other Ambulatory Visit: Payer: Self-pay | Admitting: Family Medicine

## 2024-01-15 DIAGNOSIS — R52 Pain, unspecified: Secondary | ICD-10-CM

## 2024-02-16 ENCOUNTER — Other Ambulatory Visit: Payer: Self-pay | Admitting: Family Medicine

## 2024-03-12 ENCOUNTER — Other Ambulatory Visit: Payer: Self-pay | Admitting: Family Medicine

## 2024-03-21 ENCOUNTER — Other Ambulatory Visit: Payer: Self-pay | Admitting: Family Medicine

## 2024-03-31 ENCOUNTER — Other Ambulatory Visit: Payer: Self-pay | Admitting: Cardiology

## 2024-04-01 NOTE — Assessment & Plan Note (Signed)
 Supplement and monitor

## 2024-04-01 NOTE — Assessment & Plan Note (Signed)
 Left leg was revascularized, asymptomatic

## 2024-04-01 NOTE — Assessment & Plan Note (Signed)
 Well controlled, no changes to meds. Encouraged heart healthy diet such as the DASH diet and exercise as tolerated.

## 2024-04-01 NOTE — Assessment & Plan Note (Addendum)
 Maintain Lantus  usually using 15 to 25 units daily and only up to 30 units if he has a piece of cake no changes in therapy. Due to history of insulin  use and labile blood sugars he needs to monitor his blood sugars with a continuous glucose monitor for tight control.

## 2024-04-01 NOTE — Assessment & Plan Note (Signed)
 Encourage heart healthy diet such as MIND or DASH diet, increase exercise, avoid trans fats, simple carbohydrates and processed foods, consider a krill or fish or flaxseed oil cap daily. Tolerating Atorvastatin

## 2024-04-02 ENCOUNTER — Ambulatory Visit (INDEPENDENT_AMBULATORY_CARE_PROVIDER_SITE_OTHER): Admitting: Family Medicine

## 2024-04-02 ENCOUNTER — Encounter: Payer: Self-pay | Admitting: Family Medicine

## 2024-04-02 ENCOUNTER — Other Ambulatory Visit: Payer: Self-pay | Admitting: Family Medicine

## 2024-04-02 VITALS — BP 122/68 | HR 71 | Resp 16 | Ht 71.0 in | Wt 187.4 lb

## 2024-04-02 DIAGNOSIS — I10 Essential (primary) hypertension: Secondary | ICD-10-CM | POA: Diagnosis not present

## 2024-04-02 DIAGNOSIS — E782 Mixed hyperlipidemia: Secondary | ICD-10-CM

## 2024-04-02 DIAGNOSIS — E119 Type 2 diabetes mellitus without complications: Secondary | ICD-10-CM

## 2024-04-02 DIAGNOSIS — E1169 Type 2 diabetes mellitus with other specified complication: Secondary | ICD-10-CM

## 2024-04-02 DIAGNOSIS — I739 Peripheral vascular disease, unspecified: Secondary | ICD-10-CM

## 2024-04-02 DIAGNOSIS — Z794 Long term (current) use of insulin: Secondary | ICD-10-CM

## 2024-04-02 DIAGNOSIS — E669 Obesity, unspecified: Secondary | ICD-10-CM

## 2024-04-02 DIAGNOSIS — R52 Pain, unspecified: Secondary | ICD-10-CM

## 2024-04-02 MED ORDER — LANTUS SOLOSTAR 100 UNIT/ML ~~LOC~~ SOPN
10.0000 [IU] | PEN_INJECTOR | Freq: Every day | SUBCUTANEOUS | 1 refills | Status: DC
Start: 2024-04-02 — End: 2024-06-22

## 2024-04-02 NOTE — Progress Notes (Addendum)
 Subjective:    Patient ID: Gene [name], male    DOB: 21-Jan-1956, 68 y.o.   MRN: [id number]  Chief Complaint  Patient presents with   Medical Management of Chronic Issues    Patient presents today for a 3 month follow-up.   Quality Metric Gaps    Eye exam, lung cancer screening, AWV    HPI Discussed the use of AI scribe software for clinical note transcription with the patient, who gave verbal consent to proceed.  History of Present Illness Gene [name] is a 68 year old male with type 2 diabetes who presents for diabetes management and follow-up.  He manages his type 2 diabetes with Lantus  insulin  and glimepiride . His blood sugar levels range from 70 to 180 mg/dL, occasionally rising to 230 mg/dL after dinner, which he manages by taking 10 to 15 units of Lantus . Previously, he took 25 to 35 units. He also takes 800 mg of magnesium  daily, split between morning and night doses, and reports no muscle cramps since starting this regimen.  He experiences neuropathy in his feet, described as stiffness and a lack of full sensation, but not painful. This has been a long-standing issue. No back pain, fevers, or chills.  He is currently out of work and has been for two to three weeks. Previously, he walked 8,000 to 10,000 steps a day at work but is now less active.  He recently visited a urologist for an elevated PSA level. The urologist informed him that his PSA is still slightly elevated, but his blood results have not changed. He is scheduled for follow-up in a year unless symptoms change.  He takes a multivitamin that includes vitamin D but does not take additional vitamin D supplements.    Past Medical History:  Diagnosis Date   Alcohol abuse, episodic 08/07/2008   Qualifier: Diagnosis of  By: Tanda MD, LauraLee     Anemia    Angina pectoris (HCC)    Antiplatelet or antithrombotic long-term use 10/20/2023   Arthritis    Chronic pain in right foot    Coronary artery  disease 08/07/2008   Qualifier: Diagnosis of  By: Clair RMA, Olam     Diabetic neuropathy (HCC)    Diarrhea 03/05/2021   Elevated PSA 05/03/2014   Essential hypertension 08/07/2008   Qualifier: Diagnosis of  By: Clair RMA, Lisa     FOOT PAIN, RIGHT 09/10/2008   Qualifier: Diagnosis of  By: Tanda MD, LauraLee     Gangrene of toe of left foot (HCC) 09/03/2021   GERD 08/07/2008   Qualifier: Diagnosis of  By: Clair SHARALYN Olam     H/O tobacco use, presenting hazards to health 05/21/2009   Qualifier: Diagnosis of  By: Georgian ROSALEA CHARM Lamar  Last cigarette smoked May 30, 2015     History of colonic polyps    History of diabetic ulcer of foot 05/05/2022   History of revascularization procedure of lower extremity 09/14/2021   Hx of CABG 2000   Hyperlipidemia    on meds   Hyperlipidemia, mixed 08/07/2008   Qualifier: Diagnosis of  By: Clair RMA, Lisa     Hypomagnesemia    Insomnia 03/25/2013   Left knee pain 11/03/2015   Myocardial infarction [facility]) 2006   Neuromuscular disorder (HCC)    NEUROPATHY   Obesity 06/24/2016   Overweight (BMI 25.0-29.9) 06/24/2016   PAOD (peripheral arterial occlusive disease) (HCC) 09/03/2021   Formatting of this note might be different from the original.  Added automatically from request for surgery [id number]     Peripheral angiopathy due to diabetes mellitus (HCC) 09/03/2021   Peripheral neuropathy 09/10/2008   Qualifier: Diagnosis of  By: Tanda MD, LauraLee     PERIPHERAL VASCULAR DISEASE 08/07/2008   Qualifier: Diagnosis of  By: Clair SHARALYN Planas     Plantar fasciitis    Preventative health care 05/03/2014   Psoriasis 05/03/2014   PVD (peripheral vascular disease) (HCC)    PTA RLE 12/08 [place]    SOB (shortness of breath) on exertion 05/02/2018   Status post coronary artery bypass graft 05/11/2018   2000   Type 2 diabetes mellitus with obesity (HCC) 08/07/2008   Qualifier: Diagnosis of   By: Clair SHARALYN Planas          Past Surgical  History:  Procedure Laterality Date   CORONARY ARTERY BYPASS GRAFT  2000   200 SVG to diagonal, LIMA to LAD, SVG to LCX   LEFT HEART CATH AND CORS/GRAFTS ANGIOGRAPHY N/A 06/08/2018   Procedure: LEFT HEART CATH AND CORS/GRAFTS ANGIOGRAPHY;  Surgeon: Claudene Victory ORN, MD;  Location: [facility] INVASIVE CV LAB;  Service: Cardiovascular;  Laterality: N/A;   REVISION TOTAL KNEE ARTHROPLASTY Right 2009   TONSILLECTOMY AND ADENOIDECTOMY     VASECTOMY     WISDOM TOOTH EXTRACTION      Family History  Problem Relation Age of Onset   Coronary artery disease Mother    Hypertension Mother    Hyperlipidemia Father    Aneurysm Father        brain   Prostate cancer Brother    Cancer Maternal Uncle    Hyperlipidemia Other    Hypertension Other    Stroke Other    Colon cancer Neg Hx    Esophageal cancer Neg Hx    Liver cancer Neg Hx    Pancreatic cancer Neg Hx    Rectal cancer Neg Hx    Stomach cancer Neg Hx    Colon polyps Neg Hx     Social History   Socioeconomic History   Marital status: Married    Spouse name: Not on file   Number of children: Not on file   Years of education: Not on file   Highest education level: Not on file  Occupational History   Not on file  Tobacco Use   Smoking status: Former    Current packs/day: 0.00    Average packs/day: 0.5 packs/day for 40.0 years (20.0 ttl pk-yrs)    Types: Cigarettes    Start date: 05/04/1975    Quit date: 05/04/2015    Years since quitting: 9.1   Smokeless tobacco: Never  Vaping Use   Vaping status: Never Used  Substance and Sexual Activity   Alcohol use: No    Alcohol/week: 0.0 standard drinks of alcohol   Drug use: No    Comment: rare   Sexual activity: Yes    Comment: lives with wife, retired   Other Topics Concern   Not on file  Social History Narrative   Occupation: Licensed conveyancer   Second marriage - 3 children from first marriage   Tobacco Use - Yes.     Alcohol Use - yes(rare)           Social Drivers of Health    Financial Resource Strain: Not on file  Food Insecurity: Not on file  Transportation Needs: Not on file  Physical Activity: Not on file  Stress: Not on file  Social Connections: Unknown (03/30/2022)  Received from [facility]   Social Network    Social Network: Not on file  Intimate Partner Violence: Unknown (02/18/2022)   Received from [facility]   HITS    Physically Hurt: Not on file    Insult or Talk Down To: Not on file    Threaten Physical Harm: Not on file    Scream or Curse: Not on file    Outpatient Medications Prior to Visit  Medication Sig Dispense Refill   Alpha-D-Galactosidase (ANTI-GAS PO) Take 1 tablet by mouth daily. Unknown strenght     amLODipine  (NORVASC ) 10 MG tablet Take 1 tablet by mouth once daily 90 tablet 1   aspirin  81 MG EC tablet Take 81 mg by mouth daily.     atorvastatin  (LIPITOR) 40 MG tablet Take 1 tablet by mouth once daily 90 tablet 1   clopidogrel  (PLAVIX ) 75 MG tablet Take 1 tablet by mouth once daily 90 tablet 1   fish oil-omega-3 fatty acids 1000 MG capsule Take 1 g by mouth at bedtime.     glimepiride  (AMARYL ) 4 MG tablet Take 1 tablet (4 mg total) by mouth in the morning and at bedtime. 180 tablet 1   lisinopril  (ZESTRIL ) 20 MG tablet Take 1 tablet (20 mg total) by mouth daily. 90 tablet 2   MAGNESIUM  EXTRA STRENGTH 400 MG CAPS TAKE 1 CAPSULE BY MOUTH ONCE DAILY 30 capsule 3   metFORMIN  (GLUCOPHAGE -XR) 500 MG 24 hr tablet Take 2 tablets by mouth twice daily 360 tablet 1   Multiple Vitamins-Minerals (CENTRUM PO) Take 1 tablet by mouth daily. Unknown strenght     nitroGLYCERIN (NITROSTAT) 0.4 MG SL tablet Place 0.4 mg under the tongue every 5 (five) minutes as needed for chest pain.     omeprazole  (PRILOSEC) 40 MG capsule Take 1 capsule by mouth once daily 90 capsule 0   ranolazine  (RANEXA ) 500 MG 12 hr tablet Take 1 tablet (500 mg total) by mouth 2 (two) times daily. 180 tablet 2   Specialty Vitamins Products (MAGNESIUM , AMINO ACID  CHELATE,) 133 MG tablet Take 1 tablet by mouth daily. Unknown strenght     tiZANidine  (ZANAFLEX ) 2 MG tablet Take 0.5-2 tablets (1-4 mg total) by mouth every 6 (six) hours as needed for muscle spasms. 60 tablet 2   VITAMIN D, CHOLECALCIFEROL, PO Take 1 tablet by mouth daily. Unknown strenght     gabapentin  (NEURONTIN ) 600 MG tablet TAKE 1 TABLET BY MOUTH TWICE DAILY AND 1 TABLET AS NEEDED FOR  SEVERE  PAIN 270 tablet 0   labetalol  (NORMODYNE ) 200 MG tablet Take 1 tablet by mouth twice daily 180 tablet 0   LANTUS  SOLOSTAR 100 UNIT/ML Solostar Pen INJECT 25 TO 35 UNITS SUBCUTANEOUSLY AT BEDTIME (Patient taking differently: Inject 25-35 Units into the skin daily.) 30 mL 0   No facility-administered medications prior to visit.    No Known Allergies  Review of Systems  Constitutional:  Negative for fever and malaise/fatigue.  HENT:  Negative for congestion.   Eyes:  Negative for blurred vision.  Respiratory:  Negative for shortness of breath.   Cardiovascular:  Negative for chest pain, palpitations and leg swelling.  Gastrointestinal:  Negative for abdominal pain, blood in stool and nausea.  Genitourinary:  Negative for dysuria and frequency.  Musculoskeletal:  Negative for falls.  Skin:  Negative for rash.  Neurological:  Negative for dizziness, loss of consciousness and headaches.  Endo/Heme/Allergies:  Negative for environmental allergies.  Psychiatric/Behavioral:  Negative for depression. The patient is  not nervous/anxious.        Objective:     Physical Exam Vitals reviewed.  Constitutional:      Appearance: Normal appearance. He is not ill-appearing.  HENT:     Head: Normocephalic and atraumatic.     Nose: Nose normal.  Eyes:     Conjunctiva/sclera: Conjunctivae normal.  Cardiovascular:     Rate and Rhythm: Normal rate.     Pulses: Normal pulses.     Heart sounds: No murmur heard.    No friction rub.  Pulmonary:     Effort: Pulmonary effort is normal.     Breath  sounds: Normal breath sounds. No wheezing.  Abdominal:     Palpations: Abdomen is soft. There is no mass.     Tenderness: There is no abdominal tenderness.  Musculoskeletal:     Cervical back: Normal range of motion.     Right lower leg: No edema.     Left lower leg: No edema.  Skin:    General: Skin is warm and dry.  Neurological:     General: No focal deficit present.     Mental Status: He is alert and oriented to person, place, and time.  Psychiatric:        Mood and Affect: Mood normal.     BP 122/68   Pulse 71   Resp 16   Ht 5' 11 (1.803 m)   Wt 187 lb 6.4 oz (85 kg)   SpO2 96%   BMI 26.14 kg/m  Wt Readings from Last 3 Encounters:  05/24/24 193 lb 1.3 oz (87.6 kg)  04/02/24 187 lb 6.4 oz (85 kg)  12/22/23 186 lb (84.4 kg)    Diabetic Foot Exam - Simple   No data filed    Lab Results  Component Value Date   WBC 8.2 04/02/2024   HGB 11.7 (L) 04/02/2024   HCT 35.0 (L) 04/02/2024   PLT 470.0 (H) 04/02/2024   GLUCOSE 112 (H) 04/02/2024   CHOL 105 04/02/2024   TRIG 83.0 04/02/2024   HDL 34.30 (L) 04/02/2024   LDLCALC 55 04/02/2024   ALT 12 04/02/2024   AST 17 04/02/2024   NA 138 04/02/2024   K 5.5 No hemolysis seen (H) 04/02/2024   CL 99 04/02/2024   CREATININE 1.30 04/02/2024   BUN 21 04/02/2024   CO2 27 04/02/2024   TSH 1.37 04/02/2024   PSA 14.74 (H) 12/22/2023   INR 1.1 07/31/2008   HGBA1C 7.6 (H) 04/02/2024   MICROALBUR 4.1 (H) 03/15/2016    Lab Results  Component Value Date   TSH 1.37 04/02/2024   Lab Results  Component Value Date   WBC 8.2 04/02/2024   HGB 11.7 (L) 04/02/2024   HCT 35.0 (L) 04/02/2024   MCV 90.1 04/02/2024   PLT 470.0 (H) 04/02/2024   Lab Results  Component Value Date   NA 138 04/02/2024   K 5.5 No hemolysis seen (H) 04/02/2024   CO2 27 04/02/2024   GLUCOSE 112 (H) 04/02/2024   BUN 21 04/02/2024   CREATININE 1.30 04/02/2024   BILITOT 0.4 04/02/2024   ALKPHOS 80 04/02/2024   AST 17 04/02/2024   ALT 12  04/02/2024   PROT 7.3 04/02/2024   ALBUMIN 4.4 04/02/2024   CALCIUM  10.0 04/02/2024   ANIONGAP 14 06/08/2018   EGFR 63 07/11/2023   GFR 56.17 (L) 04/02/2024   Lab Results  Component Value Date   CHOL 105 04/02/2024   Lab Results  Component Value Date  HDL 34.30 (L) 04/02/2024   Lab Results  Component Value Date   LDLCALC 55 04/02/2024   Lab Results  Component Value Date   TRIG 83.0 04/02/2024   Lab Results  Component Value Date   CHOLHDL 3 04/02/2024   Lab Results  Component Value Date   HGBA1C 7.6 (H) 04/02/2024       Assessment & Plan:  Essential hypertension Assessment & Plan: Well controlled, no changes to meds. Encouraged heart healthy diet such as the DASH diet and exercise as tolerated.   Orders: -     Comprehensive metabolic panel with GFR -     CBC with Differential/Platelet -     TSH  Hyperlipidemia, mixed Assessment & Plan: Encourage heart healthy diet such as MIND or DASH diet, increase exercise, avoid trans fats, simple carbohydrates and processed foods, consider a krill or fish or flaxseed oil cap daily. Tolerating Atorvastatin   Orders: -     Lipid panel  Hypomagnesemia Assessment & Plan: Supplement and monitor   Orders: -     Magnesium ; Future  Type 2 diabetes mellitus with obesity (HCC) Assessment & Plan:  Maintain Lantus  usually using 15 to 25 units daily and only up to 30 units if he has a piece of cake no changes in therapy. Due to history of insulin  use and labile blood sugars he needs to monitor his blood sugars with a continuous glucose monitor for tight control.  Orders: -     Hemoglobin A1c  PVD (peripheral vascular disease) (HCC) Assessment & Plan: Left leg was revascularized, asymptomatic          Harlene Horton, MD

## 2024-04-02 NOTE — Patient Instructions (Signed)

## 2024-04-03 ENCOUNTER — Ambulatory Visit: Payer: Self-pay | Admitting: Family Medicine

## 2024-04-03 LAB — COMPREHENSIVE METABOLIC PANEL WITH GFR
ALT: 12 U/L (ref 0–53)
AST: 17 U/L (ref 0–37)
Albumin: 4.4 g/dL (ref 3.5–5.2)
Alkaline Phosphatase: 80 U/L (ref 39–117)
BUN: 21 mg/dL (ref 6–23)
CO2: 27 meq/L (ref 19–32)
Calcium: 10 mg/dL (ref 8.4–10.5)
Chloride: 99 meq/L (ref 96–112)
Creatinine, Ser: 1.3 mg/dL (ref 0.40–1.50)
GFR: 56.17 mL/min — ABNORMAL LOW (ref >60.00)
Glucose, Bld: 112 mg/dL — ABNORMAL HIGH (ref 70–99)
Potassium: 5.5 meq/L — ABNORMAL HIGH (ref 3.5–5.1)
Sodium: 138 meq/L (ref 135–145)
Total Bilirubin: 0.4 mg/dL (ref 0.2–1.2)
Total Protein: 7.3 g/dL (ref 6.0–8.3)

## 2024-04-03 LAB — CBC WITH DIFFERENTIAL/PLATELET
Basophils Absolute: 0.1 10*3/uL (ref 0.0–0.1)
Basophils Relative: 0.7 % (ref 0.0–3.0)
Eosinophils Absolute: 0.1 10*3/uL (ref 0.0–0.7)
Eosinophils Relative: 0.8 % (ref 0.0–5.0)
HCT: 35 % — ABNORMAL LOW (ref 39.0–52.0)
Hemoglobin: 11.7 g/dL — ABNORMAL LOW (ref 13.0–17.0)
Lymphocytes Relative: 23.2 % (ref 12.0–46.0)
Lymphs Abs: 1.9 10*3/uL (ref 0.7–4.0)
MCHC: 33.4 g/dL (ref 30.0–36.0)
MCV: 90.1 fl (ref 78.0–100.0)
Monocytes Absolute: 0.5 10*3/uL (ref 0.1–1.0)
Monocytes Relative: 5.7 % (ref 3.0–12.0)
Neutro Abs: 5.7 10*3/uL (ref 1.4–7.7)
Neutrophils Relative %: 69.6 % (ref 43.0–77.0)
Platelets: 470 10*3/uL — ABNORMAL HIGH (ref 150.0–400.0)
RBC: 3.88 Mil/uL — ABNORMAL LOW (ref 4.22–5.81)
RDW: 15.1 % (ref 11.5–15.5)
WBC: 8.2 10*3/uL (ref 4.0–10.5)

## 2024-04-03 LAB — LIPID PANEL
Cholesterol: 105 mg/dL (ref 0–200)
HDL: 34.3 mg/dL — ABNORMAL LOW (ref >39.00)
LDL Cholesterol: 55 mg/dL (ref 0–99)
NonHDL: 71.1
Total CHOL/HDL Ratio: 3
Triglycerides: 83 mg/dL (ref 0.0–149.0)
VLDL: 16.6 mg/dL (ref 0.0–40.0)

## 2024-04-03 LAB — HEMOGLOBIN A1C: Hgb A1c MFr Bld: 7.6 % — ABNORMAL HIGH (ref 4.6–6.5)

## 2024-04-03 LAB — TSH: TSH: 1.37 u[IU]/mL (ref 0.35–5.50)

## 2024-04-04 ENCOUNTER — Other Ambulatory Visit: Payer: Self-pay | Admitting: Family Medicine

## 2024-04-05 ENCOUNTER — Ambulatory Visit: Payer: Federal, State, Local not specified - PPO | Admitting: Family Medicine

## 2024-05-16 ENCOUNTER — Telehealth: Payer: Self-pay | Admitting: Family Medicine

## 2024-05-16 NOTE — Telephone Encounter (Signed)
 Copied from CRM [id number]. Topic: Medical Record Request - Records Request >> May 16, 2024 10:08 AM Montie POUR wrote: Reason for CRM:  Wanda from [facility] [phone] Ext [phone]). She needs date of last office visit and last office notes. She needs to know if Chyrl is still using the free style meter. She faxed request on 05/11/24. Please call Wanda to discuss.  Thanks

## 2024-05-21 ENCOUNTER — Telehealth: Payer: Self-pay | Admitting: Family Medicine

## 2024-05-21 NOTE — Telephone Encounter (Signed)
 Copied from CRM [id number]. Topic: Medical Record Request - Records Request >> May 21, 2024  2:37 PM Chasity T wrote: Reason for CRM: Saph calling from [facility] is requesting progress notes from May 19th 2025 appointment. Please fax over at [phone]

## 2024-05-23 ENCOUNTER — Other Ambulatory Visit: Payer: Self-pay

## 2024-05-23 DIAGNOSIS — E785 Hyperlipidemia, unspecified: Secondary | ICD-10-CM | POA: Insufficient documentation

## 2024-05-23 NOTE — Telephone Encounter (Signed)
 Called patient per Dr. Domenica to see if he uses the diabetic supplies and no answer left vm to return call.

## 2024-05-24 ENCOUNTER — Ambulatory Visit: Attending: Cardiology | Admitting: Cardiology

## 2024-05-24 ENCOUNTER — Encounter: Payer: Self-pay | Admitting: Cardiology

## 2024-05-24 VITALS — BP 122/60 | HR 72 | Ht 71.0 in | Wt 193.1 lb

## 2024-05-24 DIAGNOSIS — I25118 Atherosclerotic heart disease of native coronary artery with other forms of angina pectoris: Secondary | ICD-10-CM | POA: Insufficient documentation

## 2024-05-24 DIAGNOSIS — I10 Essential (primary) hypertension: Secondary | ICD-10-CM | POA: Insufficient documentation

## 2024-05-24 DIAGNOSIS — E1169 Type 2 diabetes mellitus with other specified complication: Secondary | ICD-10-CM | POA: Insufficient documentation

## 2024-05-24 DIAGNOSIS — I739 Peripheral vascular disease, unspecified: Secondary | ICD-10-CM | POA: Insufficient documentation

## 2024-05-24 DIAGNOSIS — I779 Disorder of arteries and arterioles, unspecified: Secondary | ICD-10-CM | POA: Diagnosis present

## 2024-05-24 DIAGNOSIS — E669 Obesity, unspecified: Secondary | ICD-10-CM | POA: Diagnosis present

## 2024-05-24 DIAGNOSIS — Z951 Presence of aortocoronary bypass graft: Secondary | ICD-10-CM | POA: Insufficient documentation

## 2024-05-24 NOTE — Progress Notes (Signed)
 Cardiology Office Note:    Date:  05/24/2024   ID:  Gene [name], DOB 1956-05-03, MRN [id number]  PCP:  Domenica Harlene LABOR, MD  Cardiologist:  Lamar Fitch, MD    Referring MD: Domenica Harlene LABOR, MD   No chief complaint on file.   History of Present Illness:    Gene [name] is a 68 y.o. male   past medical history significant for coronary artery disease status post coronary bypass graft done many years ago last evaluation was in 2019 Cardiac catheterization has been done.  There were no target lesions for intervention, additional problem include diabetes mellitus which is poorly controlled, essential hypertension, dyslipidemia. Comes today 2 months for follow-up overall cardiac wise doing well denies have any chest pain tightness squeezing pressure burning chest he is upset because he lost his job he is looking for a job.  Past Medical History:  Diagnosis Date   Alcohol abuse, episodic 08/07/2008   Qualifier: Diagnosis of  By: Tanda MD, LauraLee     Anemia    Angina pectoris (HCC)    Antiplatelet or antithrombotic long-term use 10/20/2023   Arthritis    Chronic pain in right foot    Coronary artery disease 08/07/2008   Qualifier: Diagnosis of  By: Clair RMA, Olam     Diabetic neuropathy (HCC)    Diarrhea 03/05/2021   Elevated PSA 05/03/2014   Essential hypertension 08/07/2008   Qualifier: Diagnosis of  By: Clair RMA, Lisa     FOOT PAIN, RIGHT 09/10/2008   Qualifier: Diagnosis of  By: Tanda MD, LauraLee     Gangrene of toe of left foot (HCC) 09/03/2021   GERD 08/07/2008   Qualifier: Diagnosis of  By: Clair SHARALYN Olam     H/O tobacco use, presenting hazards to health 05/21/2009   Qualifier: Diagnosis of  By: Georgian ROSALEA CHARM Lamar  Last cigarette smoked May 30, 2015     History of colonic polyps    History of diabetic ulcer of foot 05/05/2022   History of revascularization procedure of lower extremity 09/14/2021   Hx of CABG 2000   Hyperlipidemia    on meds    Hyperlipidemia, mixed 08/07/2008   Qualifier: Diagnosis of  By: Clair RMA, Lisa     Hypomagnesemia    Insomnia 03/25/2013   Left knee pain 11/03/2015   Myocardial infarction [facility]) 2006   Neuromuscular disorder (HCC)    NEUROPATHY   Obesity 06/24/2016   Overweight (BMI 25.0-29.9) 06/24/2016   PAOD (peripheral arterial occlusive disease) (HCC) 09/03/2021   Formatting of this note might be different from the original.  Added automatically from request for surgery [id number]     Peripheral angiopathy due to diabetes mellitus (HCC) 09/03/2021   Peripheral neuropathy 09/10/2008   Qualifier: Diagnosis of  By: Tanda MD, LauraLee     PERIPHERAL VASCULAR DISEASE 08/07/2008   Qualifier: Diagnosis of  By: Clair SHARALYN Olam     Plantar fasciitis    Preventative health care 05/03/2014   Psoriasis 05/03/2014   PVD (peripheral vascular disease) (HCC)    PTA RLE 12/08 [place]    SOB (shortness of breath) on exertion 05/02/2018   Status post coronary artery bypass graft 05/11/2018   2000   Type 2 diabetes mellitus with obesity (HCC) 08/07/2008   Qualifier: Diagnosis of   By: Clair SHARALYN Olam          Past Surgical History:  Procedure Laterality Date   CORONARY ARTERY BYPASS GRAFT  2000  200 SVG to diagonal, LIMA to LAD, SVG to LCX   LEFT HEART CATH AND CORS/GRAFTS ANGIOGRAPHY N/A 06/08/2018   Procedure: LEFT HEART CATH AND CORS/GRAFTS ANGIOGRAPHY;  Surgeon: Claudene Victory ORN, MD;  Location: [facility] INVASIVE CV LAB;  Service: Cardiovascular;  Laterality: N/A;   REVISION TOTAL KNEE ARTHROPLASTY Right 2009   TONSILLECTOMY AND ADENOIDECTOMY     VASECTOMY     WISDOM TOOTH EXTRACTION      Current Medications: Current Meds  Medication Sig   Alpha-D-Galactosidase (ANTI-GAS PO) Take 1 tablet by mouth daily. Unknown strenght   amLODipine  (NORVASC ) 10 MG tablet Take 1 tablet by mouth once daily   aspirin  81 MG EC tablet Take 81 mg by mouth daily.   atorvastatin  (LIPITOR) 40 MG tablet Take 1 tablet  by mouth once daily   clopidogrel  (PLAVIX ) 75 MG tablet Take 1 tablet by mouth once daily   fish oil-omega-3 fatty acids 1000 MG capsule Take 1 g by mouth at bedtime.   gabapentin  (NEURONTIN ) 600 MG tablet TAKE 1 TABLET BY MOUTH TWICE DAILY AND 1 AS NEEDED FOR  SEVERE  PAIN   glimepiride  (AMARYL ) 4 MG tablet Take 1 tablet (4 mg total) by mouth in the morning and at bedtime.   labetalol  (NORMODYNE ) 200 MG tablet Take 1 tablet by mouth twice daily   LANTUS  SOLOSTAR 100 UNIT/ML Solostar Pen Inject 10-20 Units into the skin at bedtime.   lisinopril  (ZESTRIL ) 20 MG tablet Take 1 tablet (20 mg total) by mouth daily.   MAGNESIUM  EXTRA STRENGTH 400 MG CAPS TAKE 1 CAPSULE BY MOUTH ONCE DAILY   metFORMIN  (GLUCOPHAGE -XR) 500 MG 24 hr tablet Take 2 tablets by mouth twice daily   Multiple Vitamins-Minerals (CENTRUM PO) Take 1 tablet by mouth daily. Unknown strenght   nitroGLYCERIN (NITROSTAT) 0.4 MG SL tablet Place 0.4 mg under the tongue every 5 (five) minutes as needed for chest pain.   omeprazole  (PRILOSEC) 40 MG capsule Take 1 capsule by mouth once daily   ranolazine  (RANEXA ) 500 MG 12 hr tablet Take 1 tablet (500 mg total) by mouth 2 (two) times daily.   Specialty Vitamins Products (MAGNESIUM , AMINO ACID CHELATE,) 133 MG tablet Take 1 tablet by mouth daily. Unknown strenght   tiZANidine  (ZANAFLEX ) 2 MG tablet Take 0.5-2 tablets (1-4 mg total) by mouth every 6 (six) hours as needed for muscle spasms.   VITAMIN D, CHOLECALCIFEROL, PO Take 1 tablet by mouth daily. Unknown strenght     Allergies:   Patient has no known allergies.   Social History   Socioeconomic History   Marital status: Married    Spouse name: Not on file   Number of children: Not on file   Years of education: Not on file   Highest education level: Not on file  Occupational History   Not on file  Tobacco Use   Smoking status: Former    Current packs/day: 0.00    Average packs/day: 0.5 packs/day for 40.0 years (20.0 ttl pk-yrs)     Types: Cigarettes    Start date: 05/04/1975    Quit date: 05/04/2015    Years since quitting: 9.0   Smokeless tobacco: Never  Vaping Use   Vaping status: Never Used  Substance and Sexual Activity   Alcohol use: No    Alcohol/week: 0.0 standard drinks of alcohol   Drug use: No    Comment: rare   Sexual activity: Yes    Comment: lives with wife, retired   Other Topics Concern  Not on file  Social History Narrative   Occupation: grocery clerk   Second marriage - 3 children from first marriage   Tobacco Use - Yes.     Alcohol Use - yes(rare)           Social Drivers of Health   Financial Resource Strain: Not on file  Food Insecurity: Not on file  Transportation Needs: Not on file  Physical Activity: Not on file  Stress: Not on file  Social Connections: Unknown (03/30/2022)   Received from [employer]   Social Network    Social Network: Not on file     Family History: The patient's family history includes Aneurysm in his father; Cancer in his maternal uncle; Coronary artery disease in his mother; Hyperlipidemia in his father and another family member; Hypertension in his mother and another family member; Prostate cancer in his brother; Stroke in an other family member. There is no history of Colon cancer, Esophageal cancer, Liver cancer, Pancreatic cancer, Rectal cancer, Stomach cancer, or Colon polyps. ROS:   Please see the history of present illness.    All 14 point review of systems negative except as described per history of present illness  EKGs/Labs/Other Studies Reviewed:         Recent Labs: 01/03/2024: Magnesium  1.6 04/02/2024: ALT 12; BUN 21; Creatinine, Ser 1.30; Hemoglobin 11.7; Platelets 470.0; Potassium 5.5 No hemolysis seen; Sodium 138; TSH 1.37  Recent Lipid Panel    Component Value Date/Time   CHOL 105 04/02/2024 1610   CHOL 101 02/04/2020 1652   TRIG 83.0 04/02/2024 1610   HDL 34.30 (L) 04/02/2024 1610   HDL 31 (L) 02/04/2020 1652   CHOLHDL 3  04/02/2024 1610   VLDL 16.6 04/02/2024 1610   LDLCALC 55 04/02/2024 1610   LDLCALC 48 02/04/2020 1652    Physical Exam:    VS:  BP 122/60   Pulse 72   Ht 5' 11 (1.803 m)   Wt 193 lb 1.3 oz (87.6 kg)   SpO2 95%   BMI 26.93 kg/m     Wt Readings from Last 3 Encounters:  05/24/24 193 lb 1.3 oz (87.6 kg)  04/02/24 187 lb 6.4 oz (85 kg)  12/22/23 186 lb (84.4 kg)     GEN:  Well nourished, well developed in no acute distress HEENT: Normal NECK: No JVD; No carotid bruits LYMPHATICS: No lymphadenopathy CARDIAC: RRR, no murmurs, no rubs, no gallops RESPIRATORY:  Clear to auscultation without rales, wheezing or rhonchi  ABDOMEN: Soft, non-tender, non-distended MUSCULOSKELETAL:  No edema; No deformity  SKIN: Warm and dry LOWER EXTREMITIES: no swelling NEUROLOGIC:  Alert and oriented x 3 PSYCHIATRIC:  Normal affect   ASSESSMENT:    1. Coronary artery disease of native artery of native heart with stable angina pectoris (HCC)   2. PAOD (peripheral arterial occlusive disease) (HCC)   3. Type 2 diabetes mellitus with obesity (HCC)   4. Essential hypertension   5. PVD (peripheral vascular disease) (HCC)   6. Hx of CABG    PLAN:    In order of problems listed above:  Coronary disease stable with no signs and symptoms of reactivation of the problem.  Overall doing well. Type 2 diabetes doing well from that point review watching sugar very carefully. Peripheral vascular disease stable.   Medication Adjustments/Labs and Tests Ordered: Current medicines are reviewed at length with the patient today.  Concerns regarding medicines are outlined above.  No orders of the defined types were placed in this encounter.  Medication changes: No orders of the defined types were placed in this encounter.   Signed, Lamar DOROTHA Fitch, MD, [facility] 05/24/2024 4:42 PM    [facility]

## 2024-05-24 NOTE — Patient Instructions (Signed)

## 2024-05-25 NOTE — Telephone Encounter (Signed)
 Returned Wanda w/ [facility] call and they was advised that note from 04/02/24 would be faxed.  Copied from CRM [id number]. Topic: General - Other >> May 25, 2024 11:56 AM Harlene ORN wrote: Reason for CRM: Christine/ REP from [facility] follow up on the chart notes from May 19th 2025  callback: [phone] option #2

## 2024-05-25 NOTE — Telephone Encounter (Signed)
 Paperwork was faxed

## 2024-06-05 ENCOUNTER — Telehealth: Payer: Self-pay

## 2024-06-05 NOTE — Telephone Encounter (Signed)
 Copied from CRM [id number]. Topic: General - Other >> Jun 05, 2024  2:09 PM Viola F wrote: Reason for CRM: Slater from [facility] resent the request for addendum  05/31/24 and is calling to follow up on request -  It's missing CGM. Please call her at [phone] opt 2. She requested to speak with Porsha.

## 2024-06-11 NOTE — Telephone Encounter (Signed)
 Slater from [employer] called back and asked for someone to followup. Please advise at [phone] opt 2

## 2024-06-13 NOTE — Telephone Encounter (Signed)
 Slater called back to follow up and would like to know if the fax was received. Please advise.

## 2024-06-15 NOTE — Telephone Encounter (Signed)
 Emily/ REP [facility] called, following up for request or addendum for 05/19th appointment notes. Phone for [facility]: [phone]

## 2024-06-18 ENCOUNTER — Other Ambulatory Visit: Payer: Self-pay | Admitting: *Deleted

## 2024-06-18 NOTE — Telephone Encounter (Signed)
 Called and advised that provider is out of office and that patient has an upcoming visit on 07/02/24.  They stated that we can send this note instead.  It needs to say in note that pt is using the CGM.

## 2024-06-22 ENCOUNTER — Other Ambulatory Visit: Payer: Self-pay | Admitting: Family Medicine

## 2024-06-22 DIAGNOSIS — E669 Obesity, unspecified: Secondary | ICD-10-CM

## 2024-06-29 ENCOUNTER — Other Ambulatory Visit: Payer: Self-pay | Admitting: Cardiology

## 2024-07-01 NOTE — Assessment & Plan Note (Signed)
 Maintain Lantus  usually using 15 to 25 units daily and only up to 30 units if he has a piece of cake no changes in therapy. Due to history of insulin  use and labile blood sugars he needs to monitor his blood sugars with a continuous glucose monitor for tight control.

## 2024-07-01 NOTE — Assessment & Plan Note (Signed)
 Supplement and monitor

## 2024-07-01 NOTE — Assessment & Plan Note (Signed)
 Increase leafy greens, consider increased lean red meat and using cast iron cookware. Continue to monitor, report any concerns

## 2024-07-01 NOTE — Assessment & Plan Note (Signed)
 Encourage heart healthy diet such as MIND or DASH diet, increase exercise, avoid trans fats, simple carbohydrates and processed foods, consider a krill or fish or flaxseed oil cap daily. Tolerating Atorvastatin

## 2024-07-02 ENCOUNTER — Encounter: Payer: Self-pay | Admitting: Family Medicine

## 2024-07-02 ENCOUNTER — Ambulatory Visit (INDEPENDENT_AMBULATORY_CARE_PROVIDER_SITE_OTHER): Admitting: Family Medicine

## 2024-07-02 DIAGNOSIS — D649 Anemia, unspecified: Secondary | ICD-10-CM | POA: Diagnosis not present

## 2024-07-02 DIAGNOSIS — E1169 Type 2 diabetes mellitus with other specified complication: Secondary | ICD-10-CM | POA: Diagnosis not present

## 2024-07-02 DIAGNOSIS — Z794 Long term (current) use of insulin: Secondary | ICD-10-CM

## 2024-07-02 DIAGNOSIS — E669 Obesity, unspecified: Secondary | ICD-10-CM | POA: Diagnosis not present

## 2024-07-02 DIAGNOSIS — E782 Mixed hyperlipidemia: Secondary | ICD-10-CM

## 2024-07-02 LAB — CBC WITH DIFFERENTIAL/PLATELET
Basophils Absolute: 0.1 K/uL (ref 0.0–0.1)
Basophils Relative: 0.7 % (ref 0.0–3.0)
Eosinophils Absolute: 0.1 K/uL (ref 0.0–0.7)
Eosinophils Relative: 0.8 % (ref 0.0–5.0)
HCT: 35 % — ABNORMAL LOW (ref 39.0–52.0)
Hemoglobin: 11.4 g/dL — ABNORMAL LOW (ref 13.0–17.0)
Lymphocytes Relative: 21.4 % (ref 12.0–46.0)
Lymphs Abs: 2 K/uL (ref 0.7–4.0)
MCHC: 32.7 g/dL (ref 30.0–36.0)
MCV: 92.2 fl (ref 78.0–100.0)
Monocytes Absolute: 0.4 K/uL (ref 0.1–1.0)
Monocytes Relative: 4.5 % (ref 3.0–12.0)
Neutro Abs: 6.7 K/uL (ref 1.4–7.7)
Neutrophils Relative %: 72.6 % (ref 43.0–77.0)
Platelets: 399 K/uL (ref 150.0–400.0)
RBC: 3.79 Mil/uL — ABNORMAL LOW (ref 4.22–5.81)
RDW: 14.7 % (ref 11.5–15.5)
WBC: 9.2 K/uL (ref 4.0–10.5)

## 2024-07-02 LAB — COMPREHENSIVE METABOLIC PANEL WITH GFR
ALT: 10 U/L (ref 0–53)
AST: 15 U/L (ref 0–37)
Albumin: 4.5 g/dL (ref 3.5–5.2)
Alkaline Phosphatase: 89 U/L (ref 39–117)
BUN: 21 mg/dL (ref 6–23)
CO2: 29 meq/L (ref 19–32)
Calcium: 10.1 mg/dL (ref 8.4–10.5)
Chloride: 100 meq/L (ref 96–112)
Creatinine, Ser: 1.17 mg/dL (ref 0.40–1.50)
GFR: 63.63 mL/min (ref >60.00)
Glucose, Bld: 81 mg/dL (ref 70–99)
Potassium: 5.3 meq/L — ABNORMAL HIGH (ref 3.5–5.1)
Sodium: 141 meq/L (ref 135–145)
Total Bilirubin: 0.4 mg/dL (ref 0.2–1.2)
Total Protein: 7.1 g/dL (ref 6.0–8.3)

## 2024-07-02 LAB — TSH: TSH: 1.22 u[IU]/mL (ref 0.35–5.50)

## 2024-07-02 LAB — LIPID PANEL
Cholesterol: 112 mg/dL (ref 0–200)
HDL: 39.3 mg/dL (ref >39.00)
LDL Cholesterol: 52 mg/dL (ref 0–99)
NonHDL: 73.04
Total CHOL/HDL Ratio: 3
Triglycerides: 104 mg/dL (ref 0.0–149.0)
VLDL: 20.8 mg/dL (ref 0.0–40.0)

## 2024-07-02 LAB — MICROALBUMIN / CREATININE URINE RATIO
Creatinine,U: 33.4 mg/dL
Microalb Creat Ratio: 644.8 mg/g — ABNORMAL HIGH (ref 0.0–30.0)
Microalb, Ur: 21.5 mg/dL — ABNORMAL HIGH (ref 0.0–1.9)

## 2024-07-02 LAB — HEMOGLOBIN A1C: Hgb A1c MFr Bld: 7.2 % — ABNORMAL HIGH (ref 4.6–6.5)

## 2024-07-02 LAB — MAGNESIUM: Magnesium: 1.6 mg/dL (ref 1.5–2.5)

## 2024-07-02 NOTE — Patient Instructions (Addendum)
 Schedule medicare annual visit at check out  Blood pressure normal 100-140/60-90 Can drop the Amlodipine  to 5 mg daily and monitor blood pressure

## 2024-07-02 NOTE — Progress Notes (Signed)
 "  Subjective:    Patient ID: Gene [name], male    DOB: 1956-04-26, 68 y.o.   MRN: [id number]  Chief Complaint  Patient presents with   3 month follow up    HPI Discussed the use of AI scribe software for clinical note transcription with the patient, who gave verbal consent to proceed.  History of Present Illness Gene [name] is a 68 year old male with diabetes who presents for a follow-up on his glucose management and medication refills.  His blood glucose levels have been generally stable with an average of 142 mg/dL over the past seven days. He experienced a high of 330 mg/dL after consuming a large amount of spaghetti and a low of 52-53 mg/dL, which caused sweating. He feels okay at 59 mg/dL but starts to sweat when it drops below that. He is currently out of his glucose sensors, which were supposed to be sent automatically by [employer], and has reached out to the company for assistance.  His blood pressure readings have been consistently below 120/80 mmHg, even without taking his medication. He is concerned about potential lightheadedness if his blood pressure drops too low after taking his medication.  He has not been working for the past three months but has recently secured a job at [employer] starting on the 28th. He describes himself as a 'couch potato' during this period and has gained 5-6 pounds. He is mindful of his diet, avoiding sugary foods, but acknowledges consuming high-calorie foods.  He has a history of smoking and is aware of the option for low dose lung cancer screening due to his past smoking history.    Past Medical History:  Diagnosis Date   Alcohol abuse, episodic 08/07/2008   Qualifier: Diagnosis of  By: Tanda MD, LauraLee     Anemia    Angina pectoris (HCC)    Antiplatelet or antithrombotic long-term use 10/20/2023   Arthritis    Chronic pain in right foot    Coronary artery disease 08/07/2008   Qualifier: Diagnosis of  By: Clair RMA,  Olam     Diabetic neuropathy (HCC)    Diarrhea 03/05/2021   Elevated PSA 05/03/2014   Essential hypertension 08/07/2008   Qualifier: Diagnosis of  By: Clair RMA, Lisa     FOOT PAIN, RIGHT 09/10/2008   Qualifier: Diagnosis of  By: Tanda MD, LauraLee     Gangrene of toe of left foot (HCC) 09/03/2021   GERD 08/07/2008   Qualifier: Diagnosis of  By: Clair SHARALYN Olam     H/O tobacco use, presenting hazards to health 05/21/2009   Qualifier: Diagnosis of  By: Georgian ROSALEA CHARM Lamar  Last cigarette smoked May 30, 2015     History of colonic polyps    History of diabetic ulcer of foot 05/05/2022   History of revascularization procedure of lower extremity 09/14/2021   Hx of CABG 2000   Hyperlipidemia    on meds   Hyperlipidemia, mixed 08/07/2008   Qualifier: Diagnosis of  By: Clair RMA, Lisa     Hypomagnesemia    Insomnia 03/25/2013   Left knee pain 11/03/2015   Myocardial infarction [facility]) 2006   Neuromuscular disorder (HCC)    NEUROPATHY   Obesity 06/24/2016   Overweight (BMI 25.0-29.9) 06/24/2016   PAOD (peripheral arterial occlusive disease) (HCC) 09/03/2021   Formatting of this note might be different from the original.  Added automatically from request for surgery [id number]     Peripheral angiopathy due to diabetes  mellitus (HCC) 09/03/2021   Peripheral neuropathy 09/10/2008   Qualifier: Diagnosis of  By: Tanda MD, LauraLee     PERIPHERAL VASCULAR DISEASE 08/07/2008   Qualifier: Diagnosis of  By: Clair RMA, Lisa     Plantar fasciitis    Preventative health care 05/03/2014   Psoriasis 05/03/2014   PVD (peripheral vascular disease) (HCC)    PTA RLE 12/08 [place]    SOB (shortness of breath) on exertion 05/02/2018   Status post coronary artery bypass graft 05/11/2018   2000   Type 2 diabetes mellitus with obesity (HCC) 08/07/2008   Qualifier: Diagnosis of   By: Clair SHARALYN Planas          Past Surgical History:  Procedure Laterality Date   CORONARY ARTERY BYPASS  GRAFT  2000   200 SVG to diagonal, LIMA to LAD, SVG to LCX   LEFT HEART CATH AND CORS/GRAFTS ANGIOGRAPHY N/A 06/08/2018   Procedure: LEFT HEART CATH AND CORS/GRAFTS ANGIOGRAPHY;  Surgeon: Claudene Victory ORN, MD;  Location: [facility] INVASIVE CV [facility];  Service: Cardiovascular;  Laterality: N/A;   REVISION TOTAL KNEE ARTHROPLASTY Right 2009   TONSILLECTOMY AND ADENOIDECTOMY     VASECTOMY     WISDOM TOOTH EXTRACTION      Family History  Problem Relation Age of Onset   Coronary artery disease Mother    Hypertension Mother    Hyperlipidemia Father    Aneurysm Father        brain   Prostate cancer Brother    Cancer Maternal Uncle    Hyperlipidemia Other    Hypertension Other    Stroke Other    Colon cancer Neg Hx    Esophageal cancer Neg Hx    Liver cancer Neg Hx    Pancreatic cancer Neg Hx    Rectal cancer Neg Hx    Stomach cancer Neg Hx    Colon polyps Neg Hx     Social History   Socioeconomic History   Marital status: Married    Spouse name: Not on file   Number of children: Not on file   Years of education: Not on file   Highest education level: Not on file  Occupational History   Not on file  Tobacco Use   Smoking status: Former    Current packs/day: 0.00    Average packs/day: 0.5 packs/day for 40.0 years (20.0 ttl pk-yrs)    Types: Cigarettes    Start date: 05/04/1975    Quit date: 05/04/2015    Years since quitting: 9.1   Smokeless tobacco: Never  Vaping Use   Vaping status: Never Used  Substance and Sexual Activity   Alcohol use: No    Alcohol/week: 0.0 standard drinks of alcohol   Drug use: No    Comment: rare   Sexual activity: Yes    Comment: lives with wife, retired   Other Topics Concern   Not on file  Social History Narrative   Occupation: licensed conveyancer   Second marriage - 3 children from first marriage   Tobacco Use - Yes.     Alcohol Use - yes(rare)           Social Drivers of Health   Financial Resource Strain: Not on file  Food Insecurity: Not on  file  Transportation Needs: Not on file  Physical Activity: Not on file  Stress: Not on file  Social Connections: Unknown (03/30/2022)   Received from [facility]   Social Network    Social Network: Not on file  Intimate Partner Violence: Unknown (02/18/2022)   Received from [facility]   HITS    Physically Hurt: Not on file    Insult or Talk Down To: Not on file    Threaten Physical Harm: Not on file    Scream or Curse: Not on file    Outpatient Medications Prior to Visit  Medication Sig Dispense Refill   Alpha-D-Galactosidase (ANTI-GAS PO) Take 1 tablet by mouth daily. Unknown strenght     amLODipine  (NORVASC ) 10 MG tablet Take 1 tablet by mouth once daily 90 tablet 1   aspirin  81 MG EC tablet Take 81 mg by mouth daily.     atorvastatin  (LIPITOR) 40 MG tablet Take 1 tablet by mouth once daily 90 tablet 1   clopidogrel  (PLAVIX ) 75 MG tablet Take 1 tablet by mouth once daily 90 tablet 1   Continuous Glucose Receiver (FREESTYLE LIBRE 3 READER) DEVI by Does not apply route.     Continuous Glucose Sensor (FREESTYLE LIBRE 3 PLUS SENSOR) MISC by Does not apply route. Change sensor every 15 days.     fish oil-omega-3 fatty acids 1000 MG capsule Take 1 g by mouth at bedtime.     gabapentin  (NEURONTIN ) 600 MG tablet TAKE 1 TABLET BY MOUTH TWICE DAILY AND 1 AS NEEDED FOR  SEVERE  PAIN 270 tablet 0   glimepiride  (AMARYL ) 4 MG tablet Take 1 tablet (4 mg total) by mouth in the morning and at bedtime. 180 tablet 1   labetalol  (NORMODYNE ) 200 MG tablet Take 1 tablet by mouth twice daily 180 tablet 0   LANTUS  SOLOSTAR 100 UNIT/ML Solostar Pen INJECT 25 TO 35 UNITS SUBCUTANEOUSLY AT BEDTIME 30 mL 1   lisinopril  (ZESTRIL ) 20 MG tablet Take 1 tablet by mouth once daily 90 tablet 3   MAGNESIUM  EXTRA STRENGTH 400 MG CAPS TAKE 1 CAPSULE BY MOUTH ONCE DAILY 30 capsule 3   metFORMIN  (GLUCOPHAGE -XR) 500 MG 24 hr tablet Take 2 tablets by mouth twice daily 360 tablet 1   Multiple Vitamins-Minerals (CENTRUM  PO) Take 1 tablet by mouth daily. Unknown strenght     nitroGLYCERIN (NITROSTAT) 0.4 MG SL tablet Place 0.4 mg under the tongue every 5 (five) minutes as needed for chest pain.     omeprazole  (PRILOSEC) 40 MG capsule Take 1 capsule by mouth once daily 90 capsule 0   ranolazine  (RANEXA ) 500 MG 12 hr tablet Take 1 tablet (500 mg total) by mouth 2 (two) times daily. 180 tablet 2   Specialty Vitamins Products (MAGNESIUM , AMINO ACID CHELATE,) 133 MG tablet Take 1 tablet by mouth daily. Unknown strenght     tiZANidine  (ZANAFLEX ) 2 MG tablet Take 0.5-2 tablets (1-4 mg total) by mouth every 6 (six) hours as needed for muscle spasms. 60 tablet 2   VITAMIN D, CHOLECALCIFEROL, PO Take 1 tablet by mouth daily. Unknown strenght     No facility-administered medications prior to visit.    No Known Allergies  Review of Systems  Constitutional:  Negative for fever and malaise/fatigue.  HENT:  Negative for congestion.   Eyes:  Negative for blurred vision.  Respiratory:  Negative for shortness of breath.   Cardiovascular:  Negative for chest pain, palpitations and leg swelling.  Gastrointestinal:  Negative for abdominal pain, blood in stool and nausea.  Genitourinary:  Negative for dysuria and frequency.  Musculoskeletal:  Negative for falls.  Skin:  Negative for rash.  Neurological:  Negative for dizziness, loss of consciousness and headaches.  Endo/Heme/Allergies:  Negative for environmental  allergies.  Psychiatric/Behavioral:  Negative for depression. The patient is not nervous/anxious.        Objective:    Physical Exam Vitals reviewed.  Constitutional:      Appearance: Normal appearance. He is not ill-appearing.  HENT:     Head: Normocephalic and atraumatic.     Nose: Nose normal.  Eyes:     Conjunctiva/sclera: Conjunctivae normal.  Cardiovascular:     Rate and Rhythm: Normal rate.     Pulses: Normal pulses.     Heart sounds: Normal heart sounds. No murmur heard. Pulmonary:     Effort:  Pulmonary effort is normal.     Breath sounds: Normal breath sounds. No wheezing.  Abdominal:     Palpations: Abdomen is soft. There is no mass.     Tenderness: There is no abdominal tenderness.  Musculoskeletal:     Cervical back: Normal range of motion.     Right lower leg: No edema.     Left lower leg: No edema.  Skin:    General: Skin is warm and dry.  Neurological:     General: No focal deficit present.     Mental Status: He is alert and oriented to person, place, and time.  Psychiatric:        Mood and Affect: Mood normal.    BP 118/66 (BP Location: Right Arm, Patient Position: Sitting, Cuff Size: Normal)   Pulse 69   Temp 97.7 F (36.5 C) (Oral)   Resp 12   Ht 5' 11 (1.803 m)   Wt 199 lb 9.6 oz (90.5 kg)   SpO2 95%   BMI 27.84 kg/m  Wt Readings from Last 3 Encounters:  07/02/24 199 lb 9.6 oz (90.5 kg)  05/24/24 193 lb 1.3 oz (87.6 kg)  04/02/24 187 lb 6.4 oz (85 kg)    Diabetic Foot Exam - Simple   No data filed    Lab Results  Component Value Date   WBC 8.2 04/02/2024   HGB 11.7 (L) 04/02/2024   HCT 35.0 (L) 04/02/2024   PLT 470.0 (H) 04/02/2024   GLUCOSE 112 (H) 04/02/2024   CHOL 105 04/02/2024   TRIG 83.0 04/02/2024   HDL 34.30 (L) 04/02/2024   LDLCALC 55 04/02/2024   ALT 12 04/02/2024   AST 17 04/02/2024   NA 138 04/02/2024   K 5.5 No hemolysis seen (H) 04/02/2024   CL 99 04/02/2024   CREATININE 1.30 04/02/2024   BUN 21 04/02/2024   CO2 27 04/02/2024   TSH 1.37 04/02/2024   PSA 14.74 (H) 12/22/2023   INR 1.1 07/31/2008   HGBA1C 7.6 (H) 04/02/2024   MICROALBUR 4.1 (H) 03/15/2016    Lab Results  Component Value Date   TSH 1.37 04/02/2024   Lab Results  Component Value Date   WBC 8.2 04/02/2024   HGB 11.7 (L) 04/02/2024   HCT 35.0 (L) 04/02/2024   MCV 90.1 04/02/2024   PLT 470.0 (H) 04/02/2024   Lab Results  Component Value Date   NA 138 04/02/2024   K 5.5 No hemolysis seen (H) 04/02/2024   CO2 27 04/02/2024   GLUCOSE 112 (H)  04/02/2024   BUN 21 04/02/2024   CREATININE 1.30 04/02/2024   BILITOT 0.4 04/02/2024   ALKPHOS 80 04/02/2024   AST 17 04/02/2024   ALT 12 04/02/2024   PROT 7.3 04/02/2024   ALBUMIN 4.4 04/02/2024   CALCIUM  10.0 04/02/2024   ANIONGAP 14 06/08/2018   EGFR 63 07/11/2023   GFR 56.17 (L)  04/02/2024   Lab Results  Component Value Date   CHOL 105 04/02/2024   Lab Results  Component Value Date   HDL 34.30 (L) 04/02/2024   Lab Results  Component Value Date   LDLCALC 55 04/02/2024   Lab Results  Component Value Date   TRIG 83.0 04/02/2024   Lab Results  Component Value Date   CHOLHDL 3 04/02/2024   Lab Results  Component Value Date   HGBA1C 7.6 (H) 04/02/2024       Assessment & Plan:  Hypomagnesemia Assessment & Plan: Supplement and monitor    Hyperlipidemia, mixed Assessment & Plan: Encourage heart healthy diet such as MIND or DASH diet, increase exercise, avoid trans fats, simple carbohydrates and processed foods, consider a krill or fish or flaxseed oil cap daily. Tolerating Atorvastatin    Type 2 diabetes mellitus with obesity (HCC) Assessment & Plan:  Maintain Lantus  usually using 15 to 25 units daily and only up to 30 units if he has a piece of cake no changes in therapy. Due to history of insulin  use and labile blood sugars he needs to monitor his blood sugars with a continuous glucose monitor for tight control.   Anemia, unspecified type Assessment & Plan: Increase leafy greens, consider increased lean red meat and using cast iron cookware. Continue to monitor, report any concerns      Assessment and Plan Assessment & Plan Type 2 diabetes mellitus Type 2 diabetes mellitus with recent glucose fluctuations. High of 330 mg/dL after consuming a large amount of spaghetti, and a low of 52 mg/dL with symptoms of sweating. Average glucose over the last seven days is 142 mg/dL, with 20% time in target range (70-180 mg/dL). - Refer to pharmacist Tammy for  assistance with obtaining Libre sensors. - Order blood work and urine test to check kidney function.  Essential hypertension Essential hypertension with recent blood pressure readings consistently below 120/80 mmHg, even without medication. Discussed potential for lightheadedness if blood pressure drops too low. Advised that normal blood pressure range is 100-140/60-90 mmHg. If blood pressure drops below this range, lightheadedness and weakness may occur. Hydration is important to maintain blood volume and prevent drops in blood pressure. - Reduce amlodipine  to 5 mg daily by cutting the 10 mg tablet in half. - Monitor blood pressure regularly and report any significant changes.  Recording duration: 17 minutes     Harlene Horton, MD "

## 2024-07-03 ENCOUNTER — Ambulatory Visit: Payer: Self-pay | Admitting: Family Medicine

## 2024-07-04 MED ORDER — EMPAGLIFLOZIN 10 MG PO TABS: 10.0000 mg | ORAL_TABLET | Freq: Every day | ORAL | 3 refills | Status: DC

## 2024-07-09 ENCOUNTER — Other Ambulatory Visit: Payer: Self-pay | Admitting: Family Medicine

## 2024-07-09 ENCOUNTER — Telehealth: Payer: Self-pay

## 2024-07-09 NOTE — Progress Notes (Signed)
 Complex Care Management Note Care Guide Note  07/09/2024 Name: Gene [name] MRN: [id number] DOB: 07/07/1956   Complex Care Management Outreach Attempts: An unsuccessful telephone outreach was attempted today to offer the patient information about available complex care management services.  Follow Up Plan:  Additional outreach attempts will be made to offer the patient complex care management information and services.   Encounter Outcome:  No Answer  Dreama [facility]wood [facility]  [facility], [facility] VBCI Assistant Direct Dial: [phone]  Fax: [phone]

## 2024-07-10 ENCOUNTER — Other Ambulatory Visit: Payer: Self-pay | Admitting: Family Medicine

## 2024-07-12 ENCOUNTER — Ambulatory Visit: Payer: Federal, State, Local not specified - PPO | Admitting: Family Medicine

## 2024-07-17 NOTE — Progress Notes (Signed)
 Complex Care Management Note Care Guide Note  07/17/2024 Name: Gene [name] MRN: [id number] DOB: 17-Sep-1956   Complex Care Management Outreach Attempts: A second unsuccessful outreach was attempted today to offer the patient with information about available complex care management services.  Follow Up Plan:  Additional outreach attempts will be made to offer the patient complex care management information and services.   Encounter Outcome:  No Answer  Dreama [facility]wood [facility]  [facility], [facility] VBCI Assistant Direct Dial: [phone]  Fax: [phone]

## 2024-07-23 NOTE — Progress Notes (Signed)
 Complex Care Management Note  Care Guide Note 07/23/2024 Name: Gene [name] MRN: [id number] DOB: 07/16/1956  Gene [name] is a 68 y.o. year old male who sees Domenica Harlene LABOR, MD for primary care. I reached out to Reyes GORMAN Francis by phone today to offer complex care management services.  [name] was given information about Complex Care Management services today including:   The Complex Care Management services include support from the care team which includes your Nurse Care Manager, Clinical Social Worker, or Pharmacist.  The Complex Care Management team is here to help remove barriers to the health concerns and goals most important to you. Complex Care Management services are voluntary, and the patient may decline or stop services at any time by request to their care team member.   Complex Care Management Consent Status: Patient agreed to services and verbal consent obtained.   Follow up plan:  Face to Face appointment with complex care management team member scheduled for:  07/30/24 at 11:00 a.m.   Encounter Outcome:  Patient Scheduled  Dreama [facility]  [facility], [facility] VBCI Assistant Direct Dial: [phone]  Fax: [phone]

## 2024-07-30 ENCOUNTER — Telehealth: Payer: Self-pay | Admitting: Pharmacist

## 2024-07-30 ENCOUNTER — Ambulatory Visit (INDEPENDENT_AMBULATORY_CARE_PROVIDER_SITE_OTHER): Admitting: Pharmacist

## 2024-07-30 DIAGNOSIS — Z794 Long term (current) use of insulin: Secondary | ICD-10-CM | POA: Insufficient documentation

## 2024-07-30 DIAGNOSIS — E1169 Type 2 diabetes mellitus with other specified complication: Secondary | ICD-10-CM

## 2024-07-30 DIAGNOSIS — E669 Obesity, unspecified: Secondary | ICD-10-CM

## 2024-07-30 MED ORDER — FREESTYLE PRECISION NEO TEST VI STRP: ORAL_STRIP | 2 refills | Status: AC

## 2024-07-30 NOTE — Telephone Encounter (Signed)
 Attempt was made to contact patient by phone today for an initial visit with the Clinical Pharmacist Practitioner for assistance with Continuous Glucose Monitor access / Libre 3  Unable to reach patient. LM on VM with my contact number [phone] or main office number [phone]

## 2024-07-30 NOTE — Progress Notes (Signed)
 07/30/2024 Name: Gene [name] MRN: [id number] DOB: Aug 13, 1956  Chief Complaint  Patient presents with   Diabetes   Medication Management    TAKEEM KROTZER is a 68 y.o. year old male who was referred for medication management by their primary care provider, Domenica Harlene LABOR, MD. They presented for a face to face visit today.   They were referred to the pharmacist by their PCP for assistance in managing diabetes and medication access  Patient has been having difficulty getting his Libre sensors   Subjective:  Care Team: Primary Care Provider: Domenica Harlene LABOR, MD ; Next Scheduled Visit: 10/03/2024 - with Daril Jolly  Medication Access/Adherence  Current Pharmacy:  [name] [address] [place], [place] - [address] [place] [address] Phone: [phone] Fax: [phone]   Patient reports affordability concerns with their medications: No  Patient reports access/transportation concerns to their pharmacy: No  Patient reports adherence concerns with their medications:  Yes  - was not our Libre sensors for about 1 or 2 weeks due to difficulty getting them   Diabetes:  Current medications: Jardiance  10mg  daily, Lantus  5 to 10 units once a day at night, glimepiride  4mg  twice a day, metformin  500gm take 2 tablets twice a day.   Using Libre Continuous Glucose Monitor system to check blood pressure continuously. He is taking insulin  but also has history of hypoglycemia. Once had blood glucose in 40's that require EMS intervention. He still occasionally will have lows in early morning in the 60's but frequency has decreased over the last 2 to 4 weeks. He would like testing strips that go with his Herlene readers to use if needed.   Date of Download: 07/30/2024 % Time CGM is active: 97% Average Glucose: 147 mg/dL Glucose Management Indicator: 6.8  Glucose Variability: 38% (goal <36%) Time in Goal:  - Time in range 70-180: 78% - Time above  range: 10% - Time below range: 2% Observed patterns:  Patient reports hypoglycemic s/sx including dizziness, shakiness, sweating but this has improved over the last week since he started Jardiance  and lowered dose of Lantus  to 5 to 10 units at night. Patient denies hyperglycemic symptoms including nopolyuria, polydipsia, polyphagia, nocturia, neuropathy, blurred vision.   Current medication access support: none  Macrovascular and Microvascular Risk Reduction:  Statin? yes (atorvastatin ); ACEi/ARB? yes (lisinopril ) Last urinary albumin/creatinine ratio:  Lab Results  Component Value Date   MICRALBCREAT 644.8 (H) 07/02/2024   MICRALBCREAT 7.2 03/15/2016   MICRALBCREAT 177.2 (H) 08/09/2012   MICRALBCREAT 207.1 (H) 04/27/2012   MICRALBCREAT 129.6 (H) 01/07/2012   MICRALBCREAT 923.2 (H) 01/07/2011   MICRALBCREAT 46.7 (H) 02/19/2009   MICRALBCREAT 26.1 09/04/2008   Last eye exam:  Lab Results  Component Value Date   HMDIABEYEEXA No Retinopathy 06/16/2022   Last foot exam: 11/14/2023 Tobacco Use:  Tobacco Use: Medium Risk (07/02/2024)   Patient History    Smoking Tobacco Use: Former    Smokeless Tobacco Use: Never    Passive Exposure: Not on file     Objective: BP Readings from Last 3 Encounters:  07/02/24 118/66  05/24/24 122/60  04/02/24 122/68    Lab Results  Component Value Date   HGBA1C 7.2 (H) 07/02/2024    Lab Results  Component Value Date   CREATININE 1.17 07/02/2024   BUN 21 07/02/2024   NA 141 07/02/2024   K 5.3 No hemolysis seen (H) 07/02/2024   CL 100 07/02/2024   CO2 29 07/02/2024  Lab Results  Component Value Date   CHOL 112 07/02/2024   HDL 39.30 07/02/2024   LDLCALC 52 07/02/2024   TRIG 104.0 07/02/2024   CHOLHDL 3 07/02/2024    Medications Reviewed Today     Reviewed by Carla Milling, RPH-CPP (Pharmacist) on 07/30/24 at 1417  Med List Status: <None>   Medication Order Taking? Sig Documenting Provider Last Dose Status Informant   Alpha-D-Galactosidase (ANTI-GAS PO) [id number]  Take 1 tablet by mouth daily. Unknown strenght [provider]  Active   amLODipine  (NORVASC ) 10 MG tablet [id number]  Take 1 tablet by mouth once daily Domenica Harlene LABOR, MD  Active   aspirin  81 MG EC tablet [id number]  Take 81 mg by mouth daily. [provider]  Active   atorvastatin  (LIPITOR) 40 MG tablet [id number]  Take 1 tablet by mouth once daily Krasowski, Robert J, MD  Active   clopidogrel  (PLAVIX ) 75 MG tablet [id number]  Take 1 tablet by mouth once daily Copland, Jessica C, MD  Active   Continuous Glucose Receiver (FREESTYLE LIBRE 3 READER) DEVI [id number]  by Does not apply route. [provider]  Active   Continuous Glucose Sensor (FREESTYLE LIBRE 3 PLUS SENSOR) MISC [id number]  by Does not apply route. Change sensor every 15 days. [provider]  Active   empagliflozin  (JARDIANCE ) 10 MG TABS tablet [id number]  Take 1 tablet (10 mg total) by mouth daily before breakfast. Domenica Harlene LABOR, MD  Active   fish oil-omega-3 fatty acids 1000 MG capsule [id number]  Take 1 g by mouth at bedtime. [provider]  Active Self  gabapentin  (NEURONTIN ) 600 MG tablet [id number]  TAKE 1 TABLET BY MOUTH TWICE DAILY AND 1 AS NEEDED FOR  SEVERE  PAIN Domenica Harlene LABOR, MD  Active   glimepiride  (AMARYL ) 4 MG tablet [id number]  Take 1 tablet (4 mg total) by mouth in the morning and at bedtime. Domenica Harlene LABOR, MD  Active   labetalol  (NORMODYNE ) 200 MG tablet [id number]  Take 1 tablet by mouth twice daily Domenica Harlene LABOR, MD  Active   LANTUS  SOLOSTAR 100 UNIT/ML Solostar Pen [id number]  INJECT 25 TO 35 UNITS SUBCUTANEOUSLY AT BEDTIME Domenica Harlene LABOR, MD  Active   lisinopril  (ZESTRIL ) 20 MG tablet [id number]  Take 1 tablet by mouth once daily Krasowski, Robert J, MD  Active   MAGNESIUM  EXTRA STRENGTH 400 MG CAPS [id number]  TAKE 1 CAPSULE BY MOUTH ONCE DAILY Domenica Harlene LABOR, MD  Active   metFORMIN  (GLUCOPHAGE -XR) 500 MG 24 hr tablet  [id number]  Take 2 tablets by mouth twice daily Domenica Harlene LABOR, MD  Active   Multiple Vitamins-Minerals (CENTRUM PO) [id number]  Take 1 tablet by mouth daily. Unknown strenght [provider]  Active Self  nitroGLYCERIN (NITROSTAT) 0.4 MG SL tablet [id number]  Place 0.4 mg under the tongue every 5 (five) minutes as needed for chest pain. [provider]  Active            Med Note (GARNER, TIFFANY L   Wed May 23, 2024  4:35 PM)    omeprazole  (PRILOSEC) 40 MG capsule [id number]  Take 1 capsule by mouth once daily Domenica Harlene LABOR, MD  Active   ranolazine  (RANEXA ) 500 MG 12 hr tablet [id number]  Take 1 tablet (500 mg total) by mouth 2 (two) times daily. Krasowski, Robert J, MD  Active   Specialty Vitamins Products (MAGNESIUM , AMINO ACID CHELATE,) 133 MG tablet [id number]  Take 1 tablet by mouth daily. Unknown strenght  [provider]  Active Self  tiZANidine  (ZANAFLEX ) 2 MG tablet [id number]  Take 0.5-2 tablets (1-4 mg total) by mouth every 6 (six) hours as needed for muscle spasms. Domenica Harlene LABOR, MD  Active   VITAMIN D, CHOLECALCIFEROL, PO [id number]  Take 1 tablet by mouth daily. Unknown strenght [provider]  Active               Assessment/Plan:   Diabetes: - Currently uncontrolled; goal A1c <7%. Cardiorenal risk reduction is opportunities for improvement. (Just started Jardiance  10mg  - could increase to 25mg  daily in the furture)  Blood pressure is at goal <130/80. LDL is at goal.  - Reviewed goal A1c, goal fasting, and goal 2 hour post prandial glucose. Recommended to check glucose continuously with [facility] sensor / Continuous Glucose Monitor . Called his Durable medical supply company - [employer]. They will need updated notes from PCP office every 6 months with Continuous Glucose Monitor instructions for Medicare guidelines. Called and confirmed patient is good thru 12/2024 and they will auto ship every 3 months. Provided patient with number for  [facility] [employer] and my number in case he has problems in the future.  - Sent in Rx for test strips he can used with Freestyle reader.   Meds ordered this encounter  Medications   glucose blood (FREESTYLE PRECISION NEO TEST) test strip    Sig: Use to check blood glucose by fingerstick as needed to confirm blood glucose prior to insulin  administration.    Dispense:  25 each    Refill:  2    - Recommend to continue Jardiance  10mg  daily, Lantus  5 to 10 units daily and glimepiride  twice a day Will continue to work to stop Lantus  if able in the future. . - Discussed potential side effects of dehydration, genitourinary infections., Advised on sick day rules (if a day with significantly reduced oral intake, serious vomiting, or diarrhea, hold SGLT2), Encouraged adequate hydration.  - Discussed possible switch to short acting insulin  to use as needed for elevated blood glucose in the future. Patient did not wish to change from Latus currently.   Follow Up Plan: 3 to 4 months  I did not charge an office visit today because patient waited for 1 hour to see me because he was not checked in by front desk.   Madelin Ray, PharmD Clinical Pharmacist [facility]

## 2024-07-30 NOTE — Telephone Encounter (Signed)
 See notes - patient was seen in office but was not checked in until noon - check in error.

## 2024-08-08 ENCOUNTER — Other Ambulatory Visit: Payer: Self-pay | Admitting: Family Medicine

## 2024-08-08 DIAGNOSIS — R52 Pain, unspecified: Secondary | ICD-10-CM

## 2024-08-11 ENCOUNTER — Other Ambulatory Visit: Payer: Self-pay | Admitting: Family Medicine

## 2024-09-06 ENCOUNTER — Other Ambulatory Visit: Payer: Self-pay | Admitting: Cardiology

## 2024-09-06 ENCOUNTER — Other Ambulatory Visit: Payer: Self-pay | Admitting: Family Medicine

## 2024-09-14 ENCOUNTER — Other Ambulatory Visit: Payer: Self-pay | Admitting: Family Medicine

## 2024-09-29 ENCOUNTER — Other Ambulatory Visit: Payer: Self-pay | Admitting: Cardiology

## 2024-10-01 ENCOUNTER — Telehealth: Payer: Self-pay | Admitting: *Deleted

## 2024-10-01 NOTE — Telephone Encounter (Signed)
 Copied from CRM #[id number]. Topic: Clinical - Order For Equipment >> Oct 01, 2024 11:02 AM Darshell M wrote: Reason for CRM: Faxed a script for patient's sensors for [employer]+ and sent release requesting last medical note. Release is in patient's chart but no refill request. Confirmed address and Phalita from [facility] refaxed refill request. CB# [phone], fa# [phone]

## 2024-10-02 NOTE — Assessment & Plan Note (Signed)
 Well controlled, no changes to meds. Encouraged heart healthy diet such as the DASH diet and exercise as tolerated.

## 2024-10-02 NOTE — Progress Notes (Unsigned)
 Subjective:     Patient ID: Gene [name], male    DOB: 1956-03-06, 68 y.o.   MRN: [id number]  No chief complaint on file.   HPI  Discussed the use of AI scribe software for clinical note transcription with the patient, who gave verbal consent to proceed.  History of Present Illness Gene [name] is a 68 year old male with hypertension and diabetes who presents for a follow-up visit.  His blood pressure is well-controlled, and he adheres to his medication regimen. He has not been monitoring it at home, feeling it is stable.  For diabetes management, he has not checked his blood sugars for two weeks due to running out of strips and issues with his sensor. He takes metformin  twice daily, Jardiance , glimepiride , and Lantus  at night, though not consistently. He experienced one hypoglycemic event at work recently, feeling lightheaded, but did not check his sugar. He managed the episode by eating a candy bar and resting. Such events are rare. He had a diabetic eye exam on September 22, 2024. He is experiencing issues with his current glasses, which are blurry at night, and is awaiting new glasses as his prescription has changed.  He quit smoking two and a half years ago after a long history of smoking. He is not interested in lung cancer screening. He takes magnesium  supplements, one tablet of 400 mg daily. He is due for a follow-up with vascular surgery on October 20, 2024, for a leg scan. He follows up with urology annually for elevated PSA.  He declined a flu shot today.  PMHx-history of MI s/p CABG, type II DM, PVD, CAD, HTN, HLD,  Follows with: Cardiology, vascular surgery, urology  HTN Amlodipine  10 mg, labetalol  200 mg BID, lisinopril  20 mg daily Not taking BP at home  CAD/PVD-follows with cardiology and vascular surgery ASA 81 mg daily; Plavix  75 mg daily  HLD-atorvastatin  40 mg daily  Diabetes: - Medications: Metformin  1000 mg twice daily ; empagliflozin  (Jardiance )  10 mg daily; glimepiride  (Amaryl ) 4 mg twice daily; Lantus  -Fasting AM: 100-110 - Compliant with medications - Denies symptoms polyuria, polydipsia, numbness extremities, foot ulcers/trauma, visual changes, wounds that are not healing, medication side effects  DM eye exam: September 22, 2024, [facility]  HCM Immunizations: Influenza & Covid-19 due Lung cancer screening: declines  Patient denies fever, chills, SOB, CP, palpitations, dyspnea, edema, HA, vision changes, N/V/D, abdominal pain, urinary symptoms, rash, weight changes, and recent illness or hospitalizations.   History of Present Illness              Health Maintenance Due  Topic Date Due   Medicare Annual Wellness (AWV)  Never done    Past Medical History:  Diagnosis Date   Alcohol abuse, episodic 08/07/2008   Qualifier: Diagnosis of  By: Tanda MD, LauraLee     Anemia    Angina pectoris    Antiplatelet or antithrombotic long-term use 10/20/2023   Arthritis    Chronic pain in right foot    Coronary artery disease 08/07/2008   Qualifier: Diagnosis of  By: Clair RMA, Olam     Diabetic neuropathy (HCC)    Diarrhea 03/05/2021   Elevated PSA 05/03/2014   Essential hypertension 08/07/2008   Qualifier: Diagnosis of  By: Clair SHARALYN Olam     FOOT PAIN, RIGHT 09/10/2008   Qualifier: Diagnosis of  By: Tanda MD, LauraLee     Gangrene of toe of left foot (HCC) 09/03/2021   GERD 08/07/2008  Qualifier: Diagnosis of  By: Clair SHARALYN Planas     H/O tobacco use, presenting hazards to health 05/21/2009   Qualifier: Diagnosis of  By: Georgian ROSALEA CHARM Lamar  Last cigarette smoked May 30, 2015     History of colonic polyps    History of diabetic ulcer of foot 05/05/2022   History of revascularization procedure of lower extremity 09/14/2021   Hx of CABG 2000   Hyperlipidemia    on meds   Hyperlipidemia, mixed 08/07/2008   Qualifier: Diagnosis of  By: Clair RMA, Lisa     Hypomagnesemia    Insomnia 03/25/2013   Left  knee pain 11/03/2015   Myocardial infarction [facility]) 2006   Neuromuscular disorder (HCC)    NEUROPATHY   Obesity 06/24/2016   Overweight (BMI 25.0-29.9) 06/24/2016   PAOD (peripheral arterial occlusive disease) 09/03/2021   Formatting of this note might be different from the original.  Added automatically from request for surgery [id number]     Peripheral angiopathy due to diabetes mellitus (HCC) 09/03/2021   Peripheral neuropathy 09/10/2008   Qualifier: Diagnosis of  By: Tanda MD, LauraLee     PERIPHERAL VASCULAR DISEASE 08/07/2008   Qualifier: Diagnosis of  By: Clair SHARALYN Planas     Plantar fasciitis    Preventative health care 05/03/2014   Psoriasis 05/03/2014   PVD (peripheral vascular disease)    PTA RLE 12/08 [place]    SOB (shortness of breath) on exertion 05/02/2018   Status post coronary artery bypass graft 05/11/2018   2000   Type 2 diabetes mellitus with obesity 08/07/2008   Qualifier: Diagnosis of   By: Clair SHARALYN Planas          Past Surgical History:  Procedure Laterality Date   CORONARY ARTERY BYPASS GRAFT  2000   200 SVG to diagonal, LIMA to LAD, SVG to LCX   LEFT HEART CATH AND CORS/GRAFTS ANGIOGRAPHY N/A 06/08/2018   Procedure: LEFT HEART CATH AND CORS/GRAFTS ANGIOGRAPHY;  Surgeon: Claudene Victory ORN, MD;  Location: [facility] INVASIVE CV [facility];  Service: Cardiovascular;  Laterality: N/A;   REVISION TOTAL KNEE ARTHROPLASTY Right 2009   TONSILLECTOMY AND ADENOIDECTOMY     VASECTOMY     WISDOM TOOTH EXTRACTION      Family History  Problem Relation Age of Onset   Coronary artery disease Mother    Hypertension Mother    Hyperlipidemia Father    Aneurysm Father        brain   Prostate cancer Brother    Cancer Maternal Uncle    Hyperlipidemia Other    Hypertension Other    Stroke Other    Colon cancer Neg Hx    Esophageal cancer Neg Hx    Liver cancer Neg Hx    Pancreatic cancer Neg Hx    Rectal cancer Neg Hx    Stomach cancer Neg Hx    Colon polyps Neg Hx      Social History   Socioeconomic History   Marital status: Married    Spouse name: Not on file   Number of children: Not on file   Years of education: Not on file   Highest education level: Not on file  Occupational History   Not on file  Tobacco Use   Smoking status: Former    Current packs/day: 0.00    Average packs/day: 0.5 packs/day for 40.0 years (20.0 ttl pk-yrs)    Types: Cigarettes    Start date: 05/04/1975    Quit date: 05/04/2015  Years since quitting: 9.4   Smokeless tobacco: Never  Vaping Use   Vaping status: Never Used  Substance and Sexual Activity   Alcohol use: No    Alcohol/week: 0.0 standard drinks of alcohol   Drug use: No    Comment: rare   Sexual activity: Yes    Comment: lives with wife, retired   Other Topics Concern   Not on file  Social History Narrative   Occupation: licensed conveyancer   Second marriage - 3 children from first marriage   Tobacco Use - Yes.     Alcohol Use - yes(rare)           Social Drivers of Corporate Investment Banker Strain: Not on file  Food Insecurity: Not on file  Transportation Needs: Not on file  Physical Activity: Not on file  Stress: Not on file  Social Connections: Not on file  Intimate Partner Violence: Not on file    Outpatient Medications Prior to Visit  Medication Sig Dispense Refill   clopidogrel  (PLAVIX ) 75 MG tablet Take 1 tablet (75 mg total) by mouth daily. 90 tablet 1   Alpha-D-Galactosidase (ANTI-GAS PO) Take 1 tablet by mouth daily. Unknown strenght     amLODipine  (NORVASC ) 10 MG tablet Take 1 tablet (10 mg total) by mouth daily. 90 tablet 1   aspirin  81 MG EC tablet Take 81 mg by mouth daily.     atorvastatin  (LIPITOR) 40 MG tablet Take 1 tablet by mouth once daily 90 tablet 2   Continuous Glucose Receiver (FREESTYLE LIBRE 3 READER) DEVI by Does not apply route.     Continuous Glucose Sensor (FREESTYLE LIBRE 3 PLUS SENSOR) MISC by Does not apply route. Change sensor every 15 days.      empagliflozin  (JARDIANCE ) 10 MG TABS tablet Take 1 tablet (10 mg total) by mouth daily before breakfast. 30 tablet 3   fish oil-omega-3 fatty acids 1000 MG capsule Take 1 g by mouth at bedtime.     gabapentin  (NEURONTIN ) 600 MG tablet TAKE 1 TABLET BY MOUTH TWICE DAILY AND  1  AS  NEEDED  FOR  SEVERE  PAIN 270 tablet 0   glimepiride  (AMARYL ) 4 MG tablet Take 1 tablet (4 mg total) by mouth in the morning and at bedtime. 180 tablet 1   glucose blood (FREESTYLE PRECISION NEO TEST) test strip Use to check blood glucose by fingerstick as needed to confirm blood glucose prior to insulin  administration. 25 each 2   labetalol  (NORMODYNE ) 200 MG tablet Take 1 tablet by mouth twice daily 180 tablet 1   LANTUS  SOLOSTAR 100 UNIT/ML Solostar Pen INJECT 25 TO 35 UNITS SUBCUTANEOUSLY AT BEDTIME 30 mL 1   lisinopril  (ZESTRIL ) 20 MG tablet Take 1 tablet by mouth once daily 90 tablet 3   MAGNESIUM  EXTRA STRENGTH 400 MG CAPS TAKE 1 CAPSULE BY MOUTH ONCE DAILY 30 capsule 3   metFORMIN  (GLUCOPHAGE -XR) 500 MG 24 hr tablet Take 2 tablets (1,000 mg total) by mouth 2 (two) times daily. 360 tablet 1   Multiple Vitamins-Minerals (CENTRUM PO) Take 1 tablet by mouth daily. Unknown strenght     nitroGLYCERIN (NITROSTAT) 0.4 MG SL tablet Place 0.4 mg under the tongue every 5 (five) minutes as needed for chest pain.     omeprazole  (PRILOSEC) 40 MG capsule Take 1 capsule by mouth once daily 90 capsule 3   ranolazine  (RANEXA ) 500 MG 12 hr tablet Take 1 tablet by mouth twice daily 180 tablet 2   Specialty Vitamins  Products (MAGNESIUM , AMINO ACID CHELATE,) 133 MG tablet Take 1 tablet by mouth daily. Unknown strenght     tiZANidine  (ZANAFLEX ) 2 MG tablet Take 0.5-2 tablets (1-4 mg total) by mouth every 6 (six) hours as needed for muscle spasms. 60 tablet 2   VITAMIN D, CHOLECALCIFEROL, PO Take 1 tablet by mouth daily. Unknown strenght     No facility-administered medications prior to visit.    No Known Allergies  ROS See HPI     Objective:    Physical Exam Vitals reviewed.  Constitutional:      General: He is not in acute distress.    Appearance: He is not toxic-appearing.  HENT:     Head: Normocephalic and atraumatic.     Mouth/Throat:     Mouth: Mucous membranes are moist.     Pharynx: Oropharynx is clear.  Eyes:     Pupils: Pupils are equal, round, and reactive to light.  Cardiovascular:     Rate and Rhythm: Normal rate and regular rhythm.     Pulses: Normal pulses.     Heart sounds: Normal heart sounds. No murmur heard. Pulmonary:     Effort: Pulmonary effort is normal. No respiratory distress.     Breath sounds: Normal breath sounds. No wheezing.  Musculoskeletal:        General: No swelling.     Cervical back: Neck supple.  Skin:    General: Skin is warm and dry.  Neurological:     General: No focal deficit present.     Mental Status: He is alert and oriented to person, place, and time.  Psychiatric:        Mood and Affect: Mood normal.        Behavior: Behavior normal.        Thought Content: Thought content normal.        Judgment: Judgment normal.      BP 121/63   Pulse 70   Ht 5' 11 (1.803 m)   Wt 196 lb 9.6 oz (89.2 kg)   SpO2 99%   BMI 27.42 kg/m  Wt Readings from Last 3 Encounters:  10/03/24 196 lb 9.6 oz (89.2 kg)  07/02/24 199 lb 9.6 oz (90.5 kg)  05/24/24 193 lb 1.3 oz (87.6 kg)       Assessment & Plan:   Problem List Items Addressed This Visit     Elevated PSA   Dr Alvaro with [facility] no changes.       Essential hypertension   Well controlled, no changes to meds. Encouraged heart healthy diet such as the DASH diet and exercise as tolerated        Relevant Orders   CBC   H/O tobacco use, presenting hazards to health   Patient declines LDCT      Hx of CABG   Follows with cardiology.  On Plavix  and ASA      Hyperlipidemia, mixed   Tolerating statin. Encourage heart healthy diet such as MIND or DASH diet, increase exercise, avoid trans fats,  simple carbohydrates and processed foods, consider a krill or fish or flaxseed oil cap daily        Hypomagnesemia   Relevant Orders   Magnesium    Preventative health care - Primary   HCM [facility] patient declines lung cancer screening Immunizations: Patient declines COVID and influenza vaccines      PVD (peripheral vascular disease)   Follows with vascular surgery.      Type 2 diabetes mellitus in patient with  obesity (HCC)   hgba1c acceptable, minimize simple carbs. Increase exercise as tolerated. Continue current meds  On statin On ACE I      Relevant Orders   HgB A1c   Other Visit Diagnoses       Hyperkalemia       Relevant Orders   Comp Met (CMET)       I am having Reyes CANDIE Meda Chyrl maintain his Multiple Vitamins-Minerals (CENTRUM PO), nitroGLYCERIN, fish oil-omega-3 fatty acids, magnesium  (amino acid chelate), (VITAMIN D, CHOLECALCIFEROL, PO), Alpha-D-Galactosidase (ANTI-GAS PO), Magnesium  Extra Strength, aspirin  EC, tiZANidine , FreeStyle Libre 3 Plus Sensor, FreeStyle Libre 3 Reader, Lantus  SoloStar, lisinopril , empagliflozin , omeprazole , labetalol , FreeStyle Precision Neo Test, gabapentin , metFORMIN , amLODipine , ranolazine , clopidogrel , glimepiride , and atorvastatin .  No orders of the defined types were placed in this encounter.

## 2024-10-02 NOTE — Telephone Encounter (Signed)
 Form was faxed already to [facility].

## 2024-10-02 NOTE — Telephone Encounter (Signed)
 Copied from CRM [id number]. Topic: General - Other >> Oct 02, 2024  1:43 PM Pinkey ORN wrote: Reason for CRM: Office Note Request >> Oct 02, 2024  1:46 PM Pinkey ORN wrote: Phalita - [facility] phone: [phone] States she's requested patient's office summary notes from 07/02/2024 and has yet to receive it. Phalita is requesting that the information is faxed over to [phone]

## 2024-10-02 NOTE — Assessment & Plan Note (Signed)
 Tolerating statin. Encourage heart healthy diet such as MIND or DASH diet, increase exercise, avoid trans fats, simple carbohydrates and processed foods, consider a krill or fish or flaxseed oil cap daily.

## 2024-10-02 NOTE — Telephone Encounter (Signed)
 Last OV notes sent again.

## 2024-10-02 NOTE — Telephone Encounter (Unsigned)
 Copied from CRM [id number]. Topic: General - Other >> Oct 02, 2024  2:23 PM Mesmerise C wrote: Reason for CRM: Gene [name] from [facility] needing OV progress notes from last visit states received prescription states pretty urgent, requested to speak to someone to get sorted out called [facility] spoke to [name] who advised she sent it over the OV notes and can't speak on the phone since she's the only one there requested to speak to [facility] but just went to lunch, stated all she can do is keep faxing it over, after coming back on hold Gene [name] stated she finally found them it was an error an their end and apologized

## 2024-10-02 NOTE — Telephone Encounter (Signed)
 Received another call from Falita- she again did not receive last OV notes- informed I faxed them at 1:50pm. States she did not receive that she received a prescription. Informed I'd resend- I have refaxed the last 2 OV notes to her attn

## 2024-10-02 NOTE — Assessment & Plan Note (Signed)
Dr Tresa Moore with [facility] no changes.

## 2024-10-02 NOTE — Assessment & Plan Note (Signed)
 hgba1c acceptable, minimize simple carbs. Increase exercise as tolerated. Continue current meds  On statin On ACE I

## 2024-10-03 ENCOUNTER — Encounter: Payer: Self-pay | Admitting: Student

## 2024-10-03 ENCOUNTER — Ambulatory Visit: Admitting: Student

## 2024-10-03 VITALS — BP 121/63 | HR 70 | Ht 71.0 in | Wt 196.6 lb

## 2024-10-03 DIAGNOSIS — Z0000 Encounter for general adult medical examination without abnormal findings: Secondary | ICD-10-CM

## 2024-10-03 DIAGNOSIS — R972 Elevated prostate specific antigen [PSA]: Secondary | ICD-10-CM

## 2024-10-03 DIAGNOSIS — I739 Peripheral vascular disease, unspecified: Secondary | ICD-10-CM

## 2024-10-03 DIAGNOSIS — E875 Hyperkalemia: Secondary | ICD-10-CM | POA: Diagnosis not present

## 2024-10-03 DIAGNOSIS — E669 Obesity, unspecified: Secondary | ICD-10-CM

## 2024-10-03 DIAGNOSIS — I10 Essential (primary) hypertension: Secondary | ICD-10-CM | POA: Diagnosis not present

## 2024-10-03 DIAGNOSIS — E119 Type 2 diabetes mellitus without complications: Secondary | ICD-10-CM | POA: Diagnosis not present

## 2024-10-03 DIAGNOSIS — Z87891 Personal history of nicotine dependence: Secondary | ICD-10-CM

## 2024-10-03 DIAGNOSIS — Z7984 Long term (current) use of oral hypoglycemic drugs: Secondary | ICD-10-CM

## 2024-10-03 DIAGNOSIS — Z951 Presence of aortocoronary bypass graft: Secondary | ICD-10-CM

## 2024-10-03 DIAGNOSIS — E782 Mixed hyperlipidemia: Secondary | ICD-10-CM

## 2024-10-03 LAB — HEMOGLOBIN A1C: Hgb A1c MFr Bld: 7.6 % — ABNORMAL HIGH (ref 4.6–6.5)

## 2024-10-03 LAB — COMPREHENSIVE METABOLIC PANEL WITH GFR
ALT: 8 U/L (ref 0–53)
AST: 12 U/L (ref 0–37)
Albumin: 4.3 g/dL (ref 3.5–5.2)
Alkaline Phosphatase: 85 U/L (ref 39–117)
BUN: 19 mg/dL (ref 6–23)
CO2: 31 meq/L (ref 19–32)
Calcium: 9.4 mg/dL (ref 8.4–10.5)
Chloride: 100 meq/L (ref 96–112)
Creatinine, Ser: 1.42 mg/dL (ref 0.40–1.50)
GFR: 50.34 mL/min — ABNORMAL LOW (ref >60.00)
Glucose, Bld: 137 mg/dL — ABNORMAL HIGH (ref 70–99)
Potassium: 4.7 meq/L (ref 3.5–5.1)
Sodium: 140 meq/L (ref 135–145)
Total Bilirubin: 0.4 mg/dL (ref 0.2–1.2)
Total Protein: 6.9 g/dL (ref 6.0–8.3)

## 2024-10-03 LAB — CBC
HCT: 31.5 % — ABNORMAL LOW (ref 39.0–52.0)
Hemoglobin: 10.4 g/dL — ABNORMAL LOW (ref 13.0–17.0)
MCHC: 33 g/dL (ref 30.0–36.0)
MCV: 86.7 fl (ref 78.0–100.0)
Platelets: 366 K/uL (ref 150.0–400.0)
RBC: 3.63 Mil/uL — ABNORMAL LOW (ref 4.22–5.81)
RDW: 15.1 % (ref 11.5–15.5)
WBC: 6.4 K/uL (ref 4.0–10.5)

## 2024-10-03 LAB — MAGNESIUM: Magnesium: 1.5 mg/dL (ref 1.5–2.5)

## 2024-10-03 NOTE — Assessment & Plan Note (Signed)
 HCM LDC patient declines lung cancer screening Immunizations: Patient declines COVID and influenza vaccines

## 2024-10-03 NOTE — Assessment & Plan Note (Signed)
 Follows with cardiology.  On Plavix  and ASA

## 2024-10-03 NOTE — Assessment & Plan Note (Signed)
Patient declines LDCT

## 2024-10-03 NOTE — Assessment & Plan Note (Signed)
Follows with vascular surgery

## 2024-10-05 ENCOUNTER — Ambulatory Visit: Payer: Self-pay | Admitting: Student

## 2024-10-05 ENCOUNTER — Telehealth: Payer: Self-pay | Admitting: Family Medicine

## 2024-10-05 DIAGNOSIS — D649 Anemia, unspecified: Secondary | ICD-10-CM

## 2024-10-05 NOTE — Telephone Encounter (Signed)
 Copied from CRM [id number]. Topic: General - Other >> Oct 05, 2024 11:07 AM Robinson H wrote: Reason for CRM: [employer] Rep calling to notify office that patients diabetic supplies were delivered yesterday for his CGM therapy supplies, and to also confirm that office received welcome kit(referral forms, pens, fidget spinners) Any other diabetic office can refer by using forms.  [employer] Rep [phone]

## 2024-10-15 ENCOUNTER — Other Ambulatory Visit

## 2024-10-15 ENCOUNTER — Ambulatory Visit: Payer: Self-pay | Admitting: Student

## 2024-10-15 DIAGNOSIS — D649 Anemia, unspecified: Secondary | ICD-10-CM

## 2024-10-15 LAB — CBC
HCT: 33.2 % — ABNORMAL LOW (ref 39.0–52.0)
Hemoglobin: 10.7 g/dL — ABNORMAL LOW (ref 13.0–17.0)
MCHC: 32.3 g/dL (ref 30.0–36.0)
MCV: 85.5 fl (ref 78.0–100.0)
Platelets: 465 K/uL — ABNORMAL HIGH (ref 150.0–400.0)
RBC: 3.89 Mil/uL — ABNORMAL LOW (ref 4.22–5.81)
RDW: 15.7 % — ABNORMAL HIGH (ref 11.5–15.5)
WBC: 9.4 K/uL (ref 4.0–10.5)

## 2024-10-17 ENCOUNTER — Telehealth: Payer: Self-pay | Admitting: Pharmacist

## 2024-10-17 NOTE — Telephone Encounter (Signed)
 Tried to out reach patient to make sure he has been able to get his Herlene 3 plus sensors and to follow up recent blood glucose readings. No answer - LM on Vm with CB# [phone]

## 2024-10-21 ENCOUNTER — Other Ambulatory Visit: Payer: Self-pay | Admitting: Family Medicine

## 2024-10-22 ENCOUNTER — Other Ambulatory Visit: Payer: Self-pay | Admitting: Family Medicine

## 2024-10-22 DIAGNOSIS — R52 Pain, unspecified: Secondary | ICD-10-CM

## 2024-11-01 ENCOUNTER — Telehealth: Payer: Self-pay | Admitting: Family Medicine

## 2024-11-01 NOTE — Telephone Encounter (Signed)
 Copied from CRM [id number]. Topic: Medicare AWV >> Nov 01, 2024  9:39 AM Nathanel DEL wrote: Called LVM 11/01/2024 to sched AWVI. Please schedule AWVI in office only  Nathanel Paschal; Care Guide Ambulatory Clinical Support [place] [facility] Direct Dial: [phone]

## 2024-11-21 ENCOUNTER — Telehealth: Payer: Self-pay | Admitting: Pharmacist

## 2024-11-21 NOTE — Telephone Encounter (Signed)
 2nd unsuccessful outreach to patient to follow up on [place] sensor.  LM On VM with CB# [phone]

## 2025-02-04 ENCOUNTER — Ambulatory Visit: Admitting: Family Medicine
